# Patient Record
Sex: Female | Born: 1940 | Race: White | Hispanic: No | Smoking: Never smoker
Health system: Southern US, Community
[De-identification: ages and names within clinical notes are randomized; demographics above are authoritative.]

## PROBLEM LIST (undated history)

## (undated) DIAGNOSIS — I2510 Atherosclerotic heart disease of native coronary artery without angina pectoris: Secondary | ICD-10-CM

## (undated) DIAGNOSIS — E119 Type 2 diabetes mellitus without complications: Secondary | ICD-10-CM

## (undated) DIAGNOSIS — G709 Myoneural disorder, unspecified: Secondary | ICD-10-CM

## (undated) DIAGNOSIS — H269 Unspecified cataract: Secondary | ICD-10-CM

## (undated) DIAGNOSIS — M1990 Unspecified osteoarthritis, unspecified site: Secondary | ICD-10-CM

## (undated) DIAGNOSIS — M810 Age-related osteoporosis without current pathological fracture: Secondary | ICD-10-CM

## (undated) DIAGNOSIS — E785 Hyperlipidemia, unspecified: Secondary | ICD-10-CM

## (undated) DIAGNOSIS — I6529 Occlusion and stenosis of unspecified carotid artery: Secondary | ICD-10-CM

## (undated) DIAGNOSIS — I10 Essential (primary) hypertension: Secondary | ICD-10-CM

## (undated) DIAGNOSIS — I739 Peripheral vascular disease, unspecified: Secondary | ICD-10-CM

## (undated) DIAGNOSIS — K219 Gastro-esophageal reflux disease without esophagitis: Secondary | ICD-10-CM

## (undated) HISTORY — DX: Atherosclerotic heart disease of native coronary artery without angina pectoris: I25.10

## (undated) HISTORY — DX: Gastro-esophageal reflux disease without esophagitis: K21.9

## (undated) HISTORY — DX: Essential (primary) hypertension: I10

## (undated) HISTORY — DX: Type 2 diabetes mellitus without complications: E11.9

## (undated) HISTORY — DX: Unspecified cataract: H26.9

## (undated) HISTORY — DX: Myoneural disorder, unspecified: G70.9

## (undated) HISTORY — PX: EYE SURGERY: SHX253

## (undated) HISTORY — PX: FRACTURE SURGERY: SHX138

## (undated) HISTORY — PX: CARDIAC CATHETERIZATION: SHX172

## (undated) HISTORY — DX: Age-related osteoporosis without current pathological fracture: M81.0

## (undated) HISTORY — DX: Peripheral vascular disease, unspecified: I73.9

## (undated) HISTORY — DX: Hyperlipidemia, unspecified: E78.5

## (undated) HISTORY — PX: BREAST SURGERY: SHX581

## (undated) HISTORY — DX: Occlusion and stenosis of unspecified carotid artery: I65.29

## (undated) HISTORY — DX: Unspecified osteoarthritis, unspecified site: M19.90

## (undated) HISTORY — PX: CORONARY ARTERY BYPASS GRAFT: SHX141

---

## 1994-04-19 HISTORY — PX: CORONARY ARTERY BYPASS GRAFT: SHX141

## 2010-04-23 ENCOUNTER — Ambulatory Visit
Admission: RE | Admit: 2010-04-23 | Disposition: A | Discharge status: Home / Self Care | Payer: Self-pay | Source: Ambulatory Visit

## 2011-01-12 ENCOUNTER — Ambulatory Visit
Admission: RE | Admit: 2011-01-12 | Disposition: A | Discharge status: Home / Self Care | Payer: Self-pay | Source: Ambulatory Visit

## 2011-05-04 ENCOUNTER — Ambulatory Visit
Admission: RE | Admit: 2011-05-04 | Disposition: A | Discharge status: Home / Self Care | Payer: Self-pay | Source: Ambulatory Visit | Admitting: Retina Specialist

## 2011-12-01 ENCOUNTER — Ambulatory Visit
Admission: RE | Admit: 2011-12-01 | Disposition: A | Discharge status: Home / Self Care | Payer: Self-pay | Source: Ambulatory Visit | Admitting: Cardiovascular Disease

## 2012-04-20 LAB — CBC
Hematocrit: 41.1 % (ref 36.0–48.0)
Hemoglobin: 13.7 gm/dL (ref 12.0–16.0)
MCH: 33 pg (ref 28–35)
MCHC: 33 gm/dL (ref 32–36)
MCV: 99 fL (ref 80–100)
MPV: 7.3 fL (ref 6.0–10.0)
PLT CT: 191 10*3/uL (ref 130–440)
RBC: 4.14 10*6/uL (ref 3.80–5.00)
RDW: 12.1 % (ref 11.0–14.0)
WBC: 4.5 10*3/uL (ref 4.0–11.0)

## 2012-04-20 LAB — BASIC METABOLIC PANEL
Anion Gap: 13.4 Gap (ref 7.0–16.0)
BUN / Creatinine Ratio: 22 Ratio (ref 10.0–30.0)
BUN: 20 mg/dL (ref 7–22)
CO2: 25.1 mMol/L (ref 20.0–30.0)
Calcium: 9.1 mg/dL (ref 8.5–10.5)
Chloride: 102 mMol/L (ref 98–110)
Creatinine: 0.91 mg/dL (ref 0.60–1.20)
EGFR: >60 mL/min/{1.73_m2}
Glucose: 257 mg/dL — ABNORMAL HIGH (ref 70–99)
Osmolality Calc: 283 mOsm/kg (ref 275–300)
Potassium: 4.5 mMol/L (ref 3.5–5.3)
Sodium: 136 mMol/L (ref 136–147)

## 2012-04-24 LAB — VH DEXTROSE STICK GLUCOSE
Glucose POCT: 217 mg/dL — ABNORMAL HIGH (ref 70–99)
Glucose POCT: 240 mg/dL — ABNORMAL HIGH (ref 70–99)

## 2012-05-22 LAB — LIPID PANEL
Cholesterol: 222 mg/dL — ABNORMAL HIGH (ref 75–199)
Coronary Heart Disease Risk: 2.18
HDL: 102 mg/dL — ABNORMAL HIGH (ref 45–65)
LDL Calculated: 109 mg/dL
Triglycerides: 54 mg/dL (ref 10–150)
VLDL: 11 (ref 0–40)

## 2012-05-22 LAB — I-STAT CHEM 8 CARTRIDGE
Anion Gap I-Stat: 16 (ref 7–16)
BUN I-Stat: 17 mg/dL (ref 6–20)
Calcium Ionized I-Stat: 4.9 mg/dL (ref 4.35–5.10)
Chloride I-Stat: 103 mMol/L (ref 98–112)
Creatinine I-Stat: 0.9 mg/dL (ref 0.60–1.10)
EGFR: >60 mL/min/{1.73_m2}
Glucose I-Stat: 200 mg/dL — ABNORMAL HIGH (ref 70–99)
Hematocrit I-Stat: 42 % (ref 36.0–48.0)
Hemoglobin I-Stat: 14.3 gm/dL (ref 12.0–16.0)
Potassium I-Stat: 4.1 mMol/L (ref 3.5–5.3)
Sodium I-Stat: 138 mMol/L (ref 135–145)
TCO2 I-Stat: 24 mMol/L (ref 24–29)

## 2012-05-22 LAB — CBC AND DIFFERENTIAL
Basophils %: 0.7 % (ref 0.0–3.0)
Basophils Absolute: 0 10*3/uL (ref 0.0–0.3)
Eosinophils %: 2.6 % (ref 0.0–7.0)
Eosinophils Absolute: 0.1 10*3/uL (ref 0.0–0.8)
Hematocrit: 42.1 % (ref 36.0–48.0)
Hemoglobin: 14.5 gm/dL (ref 12.0–16.0)
Lymphocytes Absolute: 0.7 10*3/uL (ref 0.6–5.1)
Lymphocytes: 15.7 % (ref 15.0–46.0)
MCH: 34 pg (ref 28–35)
MCHC: 35 gm/dL (ref 32–36)
MCV: 99 fL (ref 80–100)
MPV: 7 fL (ref 6.0–10.0)
Monocytes Absolute: 0.4 10*3/uL (ref 0.1–1.7)
Monocytes: 8.6 % (ref 3.0–15.0)
Neutrophils %: 72.4 % (ref 42.0–78.0)
Neutrophils Absolute: 3 10*3/uL (ref 1.7–8.6)
PLT CT: 186 10*3/uL (ref 130–440)
RBC: 4.25 10*6/uL (ref 3.80–5.00)
RDW: 12.2 % (ref 11.0–14.0)
WBC: 4.2 10*3/uL (ref 4.0–11.0)

## 2012-05-22 LAB — VH DEXTROSE STICK GLUCOSE: Glucose POCT: 146 mg/dL — ABNORMAL HIGH (ref 70–99)

## 2012-08-29 LAB — ECG 12-LEAD
P Wave Axis: 77 deg
P Wave Duration: 118 ms
P-R Interval: 170 ms
Patient Age: 70 years
Q-T Dispersion: 48 ms
Q-T Interval(Corrected): 460 ms
Q-T Interval: 396 ms
QRS Axis: -16 deg
QRS Duration: 82 ms
T Axis: 82 deg
Ventricular Rate: 81 /min

## 2012-09-09 NOTE — Op Note (Signed)
[facility]                                [facility]                             at [facility]         NAME: Lydia Medina, Lydia Medina [facility]                                   ROOM#: S/--3C         DOB:  1940-10-12                                            AGE/SEX: 70/F       ZO#:[id number]                                                     CINE#:       [id number]       _________________________________________________________________________         TEST DATE: 12-01-2011                    REF PHYS: Gretta Arab ABDUL-RAHMAN                                 LEFT HEART CATHETERIZATION         DATE OF PROCEDURE:       12-01-2011         REFERRING PHYSICIAN:       Jarrett Soho, MD         CINE NUMBER:           TYPE OF REPORT:       LEFT HEART CATHETERIZATION         PHYSICIAN PERFORMING PROCEDURE:       Matilde Bash, MD         ARTERIAL ACCESS:       Right femoral artery with modified Seldinger technique         CONTRAST:       Omnipaque         MEDICATIONS:       4000 units of Heparin in 1000 ml of NS       10000 units of Heparin in 1000 ml of NS       Versed 2mg  IV       Fentanyl 25 mg IV       Angiomax IV bolus 21ml       Angiomax IV drip 48.3 ml/hr       Lidocaine 1%         Fluoroscopy time for the procedure was 12.2 minutes.         EQUIPMENT:       [employer] Med 5 Fr. Angio-Kit Angled       Medtronic Angiographic.035 145cm J wire       Terumo 5 Fr. Short Sheath  Cook 18g needle       ACIST: Syringe, Manifold, [employer]. Jude.035 260cm Exchange J [employer].035 260cm Glidewire       [employer] 5 Fr. IM catheter       Map 112 Accessory Kit       Co-Pilot       Terumo 6 Fr. Clinical biochemist 6 Fr. FL 4.5 RunWay       Volcano Primewire Prestige       Bbraun Ultrasite IV Set         PROCEDURE:       The right groin was prepped and draped in the usual sterile fashion. A       small  amount of 1% Lidocaine was infiltrated. A JL 4 5 Fr. diagnostic       catheter was used to cannulate the native left and a JR 4 5 Fr.       diagnostic catheter was used to cannulate the right coronary artery and       the vein grafts. An IMA catheter was used to cannulate the left internal       mammary artery.         ANGIOGRAPHIC FINDINGS:       The left main is patent, though the ostial LAD is occluded.         The native circumflex is patent with a proximal segment of roughly       40-50% narrowing and subsequently it is quite small and diffusely 20-30%       diseased. It is nondominant. It gives off three very diminutive OM       branches.         The native right coronary artery is occluded at its mid point.       Proximally there is a 50% narrowing. It gives off two small RV branches       before the occlusion.         The vein graft to the obtuse marginal is widely patent with no disease       noted at either the proximal or distal anastomoses, though the OM itself       is smooth but extremely small in caliber, certainly no more than 2mm.         Vein graft to what appears to be a diagonal branch is also widely patent       with no disease at the proximal and distal anastomoses. Likewise, the       diagonal is an extremely small vessel, less than 2mm in diameter.         Vein graft to the PDA is patent with no disease of the proximal and       distal anastomoses.         The PDA is a somewhat larger vessel although also quite small. There is       no focal disease in this vessel either.         The IMA is widely patent and is grafted to an extremely small and       diffusely diseased LAD.         The only potentially attractive target for an intervention would be the       native circumflex artery which has some moderate proximal disease and  does not backfill via the OM.         FFR MEASUREMENT: I performed an FFR measurement of this which was 0.86       at maximal hyperemia and, therefore, this was  not hemodynamically       significant.         LEFT VENTRICULOGRAPHY: At the conclusion of the case, an LV gram was       performed which showed grossly normal LV systolic function with no wall       motion abnormalities and no mitral regurgitation.         HEMODYNAMICS:       AO   138/66 (98)        AOP   129/72 (100)       LV    160/10 (12)        LVP    148/51, 62         IMPRESSION:       1.  Patent vein graft to the OM, diagonal, and PDA. Patent LIMA to             the LAD.       2.  Native RCA occluded at the mid point.       3.  Native LAD occluded ostially.       4.  Native circumflex is patent but the (grafted) OM is occluded           ostially.       5.  Non-hemodynamically significant disease in the proximal           circumflex.       6.  All grafted vessels are extremely diminutive and appear           probably diffusely diseased.         COMMENT: There are no attractive targets here for revascularization. I       would recommend aggressive medical management.                                  **PRELIMINARY REPORT UNLESS SIGNED**                          SEE DOCUMENT IMAGING SYSTEM FOR FINAL REPORT                                                    ________________________________                                                Matilde Bash, MD               ID:   [id number]       DocType:   01TRCH       \:   BB       /:   [id number]       DD:  12/01/2011 16:28:03       DT:  12/02/2011 09:39:12       JOB: [id number]       CC:  MUSTAPHA ABDUL-RAHMAN (208)         >  Authenticated and Edited by Tahesha Skeet Melnick MD., On 12/02/11 2:28:08 PM

## 2012-09-09 NOTE — Discharge Summary (Signed)
[facility]                               DISCHARGE SUMMARY              NAMEMARZELL, ALLEMAND                  ADMITTED:   01/12/2011       MR#:  [id number]                            DISCHARGED: 01/12/2011       DOB:  05-Dec-1940                           ACCT#:      [id number]       _________________________________________________________________       DISCHARGE DIAGNOSES:       1.  Vitreomacular traction syndrome, right eye.       2.  Proliferative diabetic retinopathy.              HISTORY OF PRESENT ILLNESS:       The patient is 72 years of age.  She has had diabetes for       approximately 59 years.  She has a 3-year history of       hypertension.  She developed vitreomacular traction syndrome in       the right eye.  She had noticed visual changes for approximately       a year.              HOSPITAL COURSE:       The patient was admitted to the hospital and taken to the       operating room where she had monitored anesthesia care.  The       surgery consisted of pars plana vitrectomy with release of       vitreomacular traction and panretinal photocoagulation.  This       was carried out without complications.              The patient is to be discharged home today, to return to see me       tomorrow.  She is to maintain a patch and shield over the right       eye.  She is to remove it in the morning and start her drops.       She has a phone number for my practice in case she has any       difficulty.                                            **PRELIMINARY REPORT UNLESS SIGNED**                                SEE DOCUMENT IMAGING SYSTEM FOR FINAL REPORT  ________________________________                                        Maurene Capes, MD                                                                                                                   ID:   [id number]                                                           DocType:   01TRD       \:   KR                                                                 /:   187       DD:  01/12/2011 10:26:56                                                DT:  01/13/2011 01:17:03                                                JOB: [id number]                                                            CC:  Seefried ()            , (208)            NO REFERRAL,DR (1000)                   >                                                                  Authenticated by Maurene Capes., MD On 01/19/2011 11:47:20 AM

## 2012-09-09 NOTE — Discharge Summary (Signed)
[facility]                               DISCHARGE SUMMARY         NAMECAMDYNN, Lydia Medina                  ADMITTED:   12/01/2011       MR#:  [id number]                            DISCHARGED: 12/02/2011       DOB:  1941-01-02                           ACCT#:      [id number]       _________________________________________________________________       DISCHARGE DIAGNOSES:       1.  Coronary artery disease.       2.  Peripheral vascular disease.       3.  Cerebrovascular disease.       4.  Hypertension.       5.  Hyperlipidemia.         DISCHARGE MEDICATIONS:       1.  Carvedilol 6.25 mg twice daily.       2.  Lisinopril 20 mg daily, this is an increase in 10 mg           daily.       3.  Simvastatin 20 mg 0.5 tablet in the morning and 1           tablet in the evening.       4.  Lantus insulin.       5.  NovoLog insulin.         HOSPITAL COURSE:       Ms. Nease is a 72 year old woman with history of coronary       artery disease status post CABG in 1996, who has been       experiencing worsening chest pain over the last couple of months       and was referred for cardiac catheterization by Dr. Tressia Danas to       define her coronary bypass anatomy.  Separate procedure note was       dictated with the full details.           Briefly, she has patent vein grafts to an obtuse marginal, a diagonal,        a PDA arising from the right coronary, and a patent LIMA to the LAD.  Her        native vessels are all extremely small and probably diffusely diseased.         The native circumflex is relatively decent size and did have a       roughly 50% proximal lesion and it does not back fill via the       bypassed OM.  I therefore felt this would be a conceivable       target for intervention, but was unconvinced by the angiographic       appearance and the FFR measurement was only 0.86.  Therefore       this is not a hemodynamically significant lesion.  The       native right does not  backfill via the bypassed PDA and is quite  diffusely       diseased, and conceivably could be intervened upon, but this vessel is       extremely small and supplies very little myocardial territory via some       small RV branches. Therefore, I deferred intervention on this.  I think        that this scenario warrants further aggressive medical       management.  Unfortunately, she has not tolerated Imdur and       apparently had some difficulties with Coreg as well.  I will       discharge her on a slightly higher dose of lisinopril from 10 mg       to 20 mg given that she did have some elevated pressures today,       160s to 170s systolic at various times.  She will have follow up with Dr.        Tressia Danas.                                  **PRELIMINARY REPORT UNLESS SIGNED**                          SEE DOCUMENT IMAGING SYSTEM FOR FINAL REPORT                                            ________________________________                                        Matilde Bash, MD                 ID:   [id number]       DocType:   01TRD       \:   MR       /:   [id number]       DD:  12/01/2011 15:59:14       DT:  12/02/2011 02:29:30       JOB: [id number]       CC:  MUSTAPHA ABDUL-RAHMAN (208)         >  Authenticated and Edited by Mariluz Crespo Melnick MD., On 12/02/11 2:28:05 PM

## 2012-09-09 NOTE — Op Note (Signed)
[facility]                           OPERATIVE/PROCEDURE REPORT              NAME: Lydia Medina, Lydia Medina                  ADMITTED:   01/12/2011       MR#:  [id number]                            ACCT#:      [id number]       DOB:  Sep 10, 1940       _________________________________________________________________       DATE OF SURGERY:       01/12/2011              DIAGNOSES:       1.  Vitreomacular traction syndrome, right eye.       2.  Proliferative diabetic retinopathy, right eye.              PROCEDURE:       Pars plana vitrectomy with dissection of epiretinal membranes       and panretinal photocoagulation, right eye.              SURGEON:       Kylle Lall B. Montez Morita, MD              ASSISTANT:       Henrietta Hoover, CST              ANESTHESIA:       Monitored anesthesia care with retrobulbar block.              DESCRIPTION OF PROCEDURE:       The patient was taken to the operating room on the vitreoretinal       surgery stretcher.  Local and retrobulbar anesthesia were       delivered using a 50/50 mixture of 0.75% Sensorcaine and 4%       Xylocaine.  Approximately 3 mL of this solution were delivered       to the region of the stylomastoid foramen using a short 25 gauge       needle.  Another 5 mL were delivered to the retrobulbar space       using a long 25 gauge needle.  The patient was prepped in       sterile fashion using half strength Betadine solution to prep       the side of the face, the eyelashes, the eyelids, and to       irrigate the conjunctival fornices.  After prepping and draping,       a rigid lid speculum was placed.  We paused to confirm the       patient, the eye, and the procedure.              Three sclerotomies were made with the same technique.  In each       instance, a point was measured 3 mm posterior to the limbus.       The conjunctiva was displaced anteriorly, and the trocar and       cannula set was directed into the sclera in a direction        tangential to the limbus.  When the tip of the trocar had  embedded into the sclera, the trocar and cannula set was       redirected to a position perpendicular to the sclera, and the       trocar was advanced into the vitreous cavity to the hilt of the       cannula.  The trocar was removed.  One such sclerotomy was made       in the inferotemporal quadrant where the infusion cannula was       attached.  The tip of the infusion cannula was visualized       through the pupil before starting the infusion solution.  Two       additional sclerotomies were made in the 9:30 and 2:30 positions       for the light pipe and vitrector.              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  A central       vitrectomy was carried out.  There was found to be vitreomacular       traction syndrome in the posterior segment.  The membranes were              marked using Triesence and removed using the pick, the end       grasping forceps, and the vitrectomy probe.  After vitreomacular       traction had been removed, panretinal photocoagulation was       carried out using a power of 180 mW and a spot size of       approximately 200 microns.  Just over 1000 spots were applied.       The peripheral retina was examined and there were found to be no       tears or detachments.  The cannulas were removed.  The patient       was given subconjunctival injection of Ancef.  Tobramycin       ointment was placed in the operated eye.  The eye was closed and       a patch and shield were placed over the eye.  The patient was       taken back to the AM Admit Unit in satisfactory condition.                                            **PRELIMINARY REPORT UNLESS SIGNED**                                SEE DOCUMENT IMAGING SYSTEM FOR FINAL REPORT                                                        ________________________________                                        Maurene Capes, MD  ID:   [id number]                                                          DocType:   01TRO       \:   RY                                                                 /:   187       DD:  01/12/2011 10:24:26                                                DT:  01/13/2011 00:04:40                                                JOB: [id number]                                                            CC:  , (208)            NO REFERRAL,DR (1000)                   >                                                                  Authenticated by Maurene Capes., MD On 01/19/2011 11:47:16 AM

## 2012-09-09 NOTE — H&P (Signed)
[facility]                             HISTORY AND PHYSICAL         NAME: Lydia Medina, Lydia Medina                  ADMITTED:   01/12/2011       MR#:  [id number]                            ACCT#:      [id number]       DOB:  01-09-1941       _________________________________________________________________       ADMITTING DIAGNOSIS:       Vitreomacular traction syndrome, right eye.         HISTORY OF PRESENT ILLNESS:       The patient is a 72 year old female with a long history of       diabetes mellitus and diabetic retinopathy.  She has had       diabetes for approximately 59 years and a three-year history of       hypertension.  She has noted visual change in the right eye for       approximately the past year.         REVIEW OF SYSTEMS:       There is no history of chest pain, shortness of breath, coughs,       colds, infection, or claustrophobia.         PAST MEDICAL HISTORY:       Includes a 59 year history of diabetes and a three-year history       of hypertension.  She had a myocardial infarction in 1997 which       was followed by attempted stent placement and then four-vessel       bypass.  She has no history of stroke, hepatitis, HIV, cancer,       tuberculosis, or renal insufficiency.         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         FAMILY HISTORY:       Negative for retinal detachment, glaucoma, reactions to       anesthesia.         SOCIAL HISTORY:       She does not smoke, drink, or abuse drugs.         ALLERGIES:       No known drug allergies.         CURRENT MEDICATIONS:       Include:         1.  Aspirin 325 mg per day.       2.  NovoLog insulin 3 times a day on a sliding scale.       3.  Lantus insulin 2 times a day, 3 units in the morning           and 8 units in the evening.       4.  Nitro-pump 400 mg  sublingual spray.       5.  Carvedilol 6.25 t.i.d.       6.  Lisinopril 10 mg per day.       7.  Simvastatin 40 mg a half tablet in the evening.       8.  Cascara [place] 390 mg per day.       9.  Coenzyme Q10 50 mg b.i.d.       10.  Calcium and magnesium 400 mg b.i.d.       11.  Multivitamin per day.       12.  Vitamin E 400 mg three times a week.         13.  Flaxseed oil 400 mg per day.       14.  B complex per day.       15.  Isosorbide mononitrate 3 times per week.         PHYSICAL EXAMINATION:       GENERAL:  The patient is a well-developed, well-nourished       72 year old female who appears her stated age.       HEENT EXAMINATION:  Visual acuity with correction is 20/60 in       the right eye and 20/30 in the left.  Tensions and slit-lamp       examination are unremarkable except for pseudophakia.  On       dilated ophthalmoscopy of the right eye, she has       neovascularization of the disc, with diabetic and hypertensive       type microvascular changes.  She has significant vitreomacular       traction, as well as an epiretinal membrane.  There is no       traction retinal detachment.       NECK:  Supple, without bruits.       HEART:  Normal S1, S2 sounds.       CHEST:  Clear.       ABDOMEN:  Positive bowel sounds.       GENITORECTAL EXAMINATION:  Deferred.       EXTREMITIES:  Without cyanosis, clubbing, or edema.         PLAN:       The patient is to be admitted to the hospital on 01/12/2011, to       be taken to the operating room for monitored anesthesia care.       Planned surgery includes 25-gauge vitrectomy with release of       vitreomacular traction, membrane peeling, and panretinal       photocoagulation.  The indications, alternatives, risks, and       complications have been discussed with the patient, and she       wished to proceed.  All questions have been answered.                                  **PRELIMINARY REPORT UNLESS SIGNED**                          SEE DOCUMENT IMAGING SYSTEM FOR  FINAL REPORT         I hereby certify this patient for hospitalization based upon       medical necessity as noted above.  ________________________________                                        Maurene Capes, MD               ID:   [id number]       DocType:   01TRH       \:   NG       /:   187       DD:  01/08/2011 09:23:54       DT:  01/08/2011 10:42:52       JOB: [id number]       CC:  Cicero Duck MD [phone])            ABDUL-RAHMAN,MUSTAPHA (208)         >  Authenticated and Edited by Maurene Capes., MD On 01/19/11 11:48:15 AM

## 2012-09-09 NOTE — H&P (Signed)
[facility]                             HISTORY AND PHYSICAL              NAME: Lydia Medina, Lydia Medina                  ADMITTED:   12/01/2011       MR#:  [id number]                            ACCT#:      [id number]       DOB:  Sep 15, 1940       _________________________________________________________________       REFERRING PHYSICIAN:       Melanie D. Tressia Danas, M.D.              REASON FOR ADMISSION:       Heart catheterization.              HISTORY OF PRESENT ILLNESS:       Lydia Medina is a 72 year old woman with a past medical history       of coronary artery disease, status post 4-vessel CABG in 1996,       who has been experiencing increasing chest pain over the last       couple of months.  She has been having difficulties taking her       Coreg and Imdur due to lightheadedness, and currently has       required multiple sublingual nitroglycerin a day.  She is       referred for heart catheterization to define her anatomy.              ALLERGIES:       No known drug allergies.              CURRENT MEDICATIONS:       1.  Coreg 6.25 mg twice daily.       2.  Lisinopril 10 mg daily.       3.  Simvastatin 20-mg tablets, half a tab in the morning           and 1 tab in the evening.       4.  Lantus insulin.       5.  NovoLog insulin.              PAST MEDICAL HISTORY:       1.  Multivessel coronary artery disease, status post           4-vessel CABG in 1996.       2.  Known PVD with claudication.       3.  Insulin-requiring diabetes.       4.  Hypertension.       5.  Hyperlipidemia.       6.  Carotid artery disease.              FAMILY HISTORY:       Father died of an MI.  Otherwise noncontributory.              SOCIAL HISTORY:       She is married.  She is a nonsmoker and drinks alcohol.              PHYSICAL EXAMINATION:       VITAL SIGNS:  Blood pressure was  in the 120s/70s.  Heart rate is       in the 70s.       GENERAL:  She is a pleasant woman who is lying in bed, in no        distress.       HEART:  Regular rate and rhythm with normal S1 and S2, and a       faint systolic murmur.       LUNGS:  Clear to auscultation bilaterally.       ABDOMEN:  Soft, nontender, nondistended with normal bowel       sounds.  No palpable masses and no bruit.       EXTREMITIES:  Warm, well perfused.  No cyanosis, clubbing, or              edema.              LABORATORY DATA:       Reviewed and are unremarkable.              ASSESSMENT/PLAN:       Lydia Medina is a 72 year old woman with coronary artery disease,       status post coronary artery bypass graft, now with a couple of       months of increasing chest pain.  We will proceed with cardiac       catheterization today.                                            **PRELIMINARY REPORT UNLESS SIGNED**                                SEE DOCUMENT IMAGING SYSTEM FOR FINAL REPORT                I hereby certify this patient for hospitalization based upon        medical necessity as noted above.                                                      ________________________________                                        Matilde Bash, MD                                                                                                               ID:   [id number]  DocType:   01TRH       \:   LR                                                                 /:   [id number]       DD:  12/01/2011 15:52:52                                                DT:  12/01/2011 21:30:44                                                JOB: [id number]                                                            CC:  MUSTAPHA ABDUL-RAHMAN (208)                   >                                                                  Authenticated by Lucero Ide Melnick MD., On 12/02/2011 01:48:23 PM

## 2012-09-10 NOTE — H&P (Signed)
[facility]                             HISTORY AND PHYSICAL              NAME: Lydia Medina, Lydia Medina                  ADMITTED:   05/04/2011       MR#:  [id number]                            ACCT#:      [id number]       DOB:  [birth year]       _________________________________________________________________       ADMITTING DIAGNOSIS:       Traction retinal detachment, left eye.              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  She has had a prior vitrectomy to the       right eye on March 14, 2011.              REVIEW OF SYSTEMS:       There is no history of chest pain, shortness of breath, coughs,       colds, infection, or claustrophobia.              PAST MEDICAL HISTORY:       She has had an approximately 60 year history of diabetes and a       two year history of hypertension.  She had a history of a MI in       February 1997.  There is no history of stroke, hepatitis, HIV,       cancer, tuberculosis or renal disease.              PAST SURGICAL HISTORY:       She had a five-vessel bypass in 1997.  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              FAMILY HISTORY:       Negative for retinal detachment, glaucoma, or reactions to       anesthesia.              SOCIAL HISTORY:       She does not smoke, drink, or abuse drugs.              ALLERGIES:       No known drug allergies.              CURRENT MEDICATIONS:       Include:              1.  Aspirin 325, 3 times a week.       2.  She does not take Coumadin or Plavix or Pradaxa.       3.  NovoLog on a sliding scale three times a day.       4.  Lantus 3  units in a.m. and 8 units in the p.m.       5.  Nitro spray pump 400 mcg p.r.n.       6.   Carvedilol 6.25 mg b.i.d.       7.  Lisinopril 10 mg every other day.       8.  Simvastatin 40 mg 1/2 a tablet per day.       9.  Cascarnsegnade 390 mg per day.       10.  Coenzyme Q10 50 mg b.i.d.       11.  Calcium and magnesium 1000/400 mg b.i.d.       12.  Vitamin E 400 mg three times a week.       13.  Aspirin as noted.       14.  Multivitamin per day.              15.  Flaxseed oil 400 mg per day.       16.  B complex vitamins per day.       17.  Glucagon emergency kit.              PHYSICAL EXAMINATION:       GENERAL:  Patient is a well-developed, well-nourished,       72 year old white female who appears her stated age.       HEENT:  Visual acuity with correction is 20/70 in the right eye       and 20/50 in the left.  Tensions are normal.  On slit-lamp       examination of the left eye, she has a posterior chamber lens.       There is no neovascularization of the iris.  On dilated       ophthalmoscopy, she has extensive epiretinal fibrosis with areas       of traction and retinal elevation surrounding the center of the       macula.  There is some active neovascularization, but this is       relatively mild.  Initial PRP has been applied to this eye.       NECK:  Supple, without bruits.       HEART:  Normal S1, S2 sounds.       CHEST:  Clear.       ABDOMEN:  Positive bowel sounds.       GENITORECTAL EXAMINATION:  Deferred.       EXTREMITIES:  There is flexor contraction in her hands.  There       is no cyanosis, clubbing, or edema.              PLAN:       The patient will be admitted to the hospital on 05/04/2011 to be       taken to the operating room for monitored anesthesia care.       Surgery includes 25-gauge vitrectomy with removal of epiretinal       membranes and panretinal photocoagulation.  The indications,       alternatives, risks, and complications were discussed with the       patient, and she wished to proceed.  All questions have been       answered.                                             **  PRELIMINARY REPORT UNLESS SIGNED**                                SEE DOCUMENT IMAGING SYSTEM FOR FINAL REPORT                I hereby certify this patient for hospitalization based upon        medical necessity as noted above.                                                      ________________________________                                        Maurene Capes, MD                                                                                                                   ID:   [id number]                                                          DocType:   01TRH       \:   SW                                                                 /:   187       DD:  04/26/2011 09:27:00                                                DT:  04/26/2011 12:29:58                                                JOB: [id number]  CC:  MUSTAPHA ABDUL-RAHMAN ([id number])            RICHARD A SEEFRIED [phone])                   >                                                                  Authenticated by Maurene Capes., MD On 05/04/2011 05:07:37 PM

## 2012-09-10 NOTE — Op Note (Signed)
[facility]                           OPERATIVE/PROCEDURE REPORT              NAME: Lydia Medina, Lydia Medina                  ADMITTED:   05/04/2011       MR#:  [id number]                            ACCT#:      [id number]       DOB:  [birth year]       _________________________________________________________________       DATE OF SURGERY:       05/04/2011              DIAGNOSIS:       Tractional retinal detachment, left eye.              SURGERY:       Pars plana vitrectomy, peeling of epiretinal membranes and       panretinal photocoagulation, left eye.              SURGEON:       Sourish Allender B. Montez Morita, MD              ASSISTANT:       Henrietta Hoover, CST              ANESTHESIA:       Monitored anesthesia care with retrobulbar block.              PROCEDURE IN DETAIL:       The patient was taken to the operating room on the vitreoretinal       surgery stretcher.  Local and retrobulbar anesthesia were       delivered using a 50/50 mixture of 0.75% Sensorcaine and 4%       Xylocaine.  Approximately 3 mL of this solution were delivered       to the region of the stylomastoid foramen using a short 25 gauge       needle.  Another 5 mL were delivered to the retrobulbar space       using a long 25 gauge needle.  The patient was prepped in       sterile fashion using half strength Betadine solution to prep       the side of the face, the eyelashes, the eyelids, and to       irrigate the conjunctival fornices.  After prepping and draping,       a rigid lid speculum was placed.  We paused to confirm the       patient, the eye, and the procedure.              Three sclerotomies were made with the same technique.  In each       instance, a point was measured 3 mm posterior to the limbus.       The conjunctiva was displaced anteriorly, and the trocar and       cannula set was directed into the sclera in a direction       tangential to the limbus.  When the tip of the trocar had       embedded into the  sclera, the trocar and cannula set was  redirected to a position perpendicular to the sclera, and the       trocar was advanced into the vitreous cavity to the hilt of the       cannula.  The trocar was removed.  One such sclerotomy was made       in the inferotemporal quadrant where the infusion cannula was       attached.  The tip of the infusion cannula was visualized       through the pupil before starting the infusion solution.  Two       additional sclerotomies were made in the 9:30 and 2:30 positions       for the light pipe and vitrector.              The patient was found to have areas of tractional elevation       surrounding the macula.  The center of the macula was not       detached.  A dissection was carried out to remove the fibrous       material and all the vitreomacular traction.  The fibrous              material was relatively avascular.  Once all the traction had       been removed, panretinal photocoagulation was carried out using       a power of 200 mW and with the flexible endolaser probe.  Just       over 100 spots were applied.  The peripheral retina was examined       and found to be no iatrogenic tears or detachments.              The cannulas were removed from the eye.  The eye was noted to be       watertight.  The patient was given subconjunctival injections of       Ancef and topical tobramycin was applied.  The eye was closed       and patch and shield were placed over the eye.  The patient was       then taken back to the a.m. admit unit in satisfactory       condition.                                            **PRELIMINARY REPORT UNLESS SIGNED**                                SEE DOCUMENT IMAGING SYSTEM FOR FINAL REPORT                                                        ________________________________                                        Maurene Capes, MD  ID:   [id number]                                                          DocType:   01TRO       \:   AY                                                                 /:   187       DD:  05/04/2011 10:39:59                                                DT:  05/05/2011 01:25:26                                                JOB: [id number]                                                            CC:  MUSTAPHA ABDUL-RAHMAN ([id number])            RICHARD A SEEFRIED ([id number])                   >                                                                  Authenticated by Maurene Capes., MD On 05/18/2011 02:49:33 PM

## 2012-09-10 NOTE — Discharge Summary (Signed)
[facility]                               DISCHARGE SUMMARY              NAMEVERSA, CRATON                  ADMITTED:   05/04/2011       MR#:  [id number]                            DISCHARGED: 05/04/2011       DOB:  [birth year]                           ACCT#:      [id number]       _________________________________________________________________       DISCHARGE DIAGNOSIS:       Traction retinal detachment, left eye.              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  She had prior vitrectomy to the right       eye on March 14, 2011.              HOSPITAL COURSE:       The patient was admitted to the hospital and taken to the       operating room for monitored anesthesia care.  The surgery       consisted of 25-gauge vitrectomy with removal of vitreomacular       traction and panretinal photocoagulation.  This was carried out       without complications.              The patient is to be discharged home today, to return to see me       tomorrow.  She is to maintain a patch and shield over the left       eye.  She is to remove the patch in the morning and start her       drops.  She has my phone number in case she has any difficulty.                                            **PRELIMINARY REPORT UNLESS SIGNED**                                SEE DOCUMENT IMAGING SYSTEM FOR FINAL REPORT                                                        ________________________________  Maurene Capes, MD                                                                                                                   ID:   [id number]                                                          DocType:   01TRD       \:   MR                                                                  /:   187       DD:  05/04/2011 10:42:10                                                DT:  05/05/2011 01:05:34                                                JOB: [id number]                                                            CC:  MUSTAPHA ABDUL-RAHMAN (208)            RICHARD A SEEFRIED [phone])                   >                                                                  Authenticated by Maurene Capes., MD On 05/18/2011 02:49:27 PM

## 2012-10-19 ENCOUNTER — Encounter (RURAL_HEALTH_CENTER): Payer: Self-pay

## 2013-05-25 LAB — ECG 12-LEAD
P Wave Axis: 65 deg
P Wave Duration: 124 ms
P-R Interval: 170 ms
Patient Age: 69 years
Q-T Dispersion: 28 ms
Q-T Interval(Corrected): 423 ms
Q-T Interval: 378 ms
QRS Axis: -13 deg
QRS Duration: 86 ms
T Axis: 66 deg
Ventricular Rate: 86 /min

## 2014-04-23 DIAGNOSIS — I6521 Occlusion and stenosis of right carotid artery: Secondary | ICD-10-CM | POA: Diagnosis not present

## 2014-04-23 DIAGNOSIS — I10 Essential (primary) hypertension: Secondary | ICD-10-CM | POA: Diagnosis not present

## 2014-04-23 DIAGNOSIS — E1065 Type 1 diabetes mellitus with hyperglycemia: Secondary | ICD-10-CM | POA: Diagnosis not present

## 2014-04-23 DIAGNOSIS — I25810 Atherosclerosis of coronary artery bypass graft(s) without angina pectoris: Secondary | ICD-10-CM | POA: Diagnosis not present

## 2014-04-23 DIAGNOSIS — E7881 Lipoid dermatoarthritis: Secondary | ICD-10-CM | POA: Diagnosis not present

## 2014-04-23 DIAGNOSIS — Z01810 Encounter for preprocedural cardiovascular examination: Secondary | ICD-10-CM | POA: Diagnosis not present

## 2014-05-02 DIAGNOSIS — M205X1 Other deformities of toe(s) (acquired), right foot: Secondary | ICD-10-CM | POA: Diagnosis not present

## 2014-05-02 DIAGNOSIS — M2041 Other hammer toe(s) (acquired), right foot: Secondary | ICD-10-CM | POA: Diagnosis not present

## 2014-05-02 DIAGNOSIS — M25571 Pain in right ankle and joints of right foot: Secondary | ICD-10-CM | POA: Diagnosis not present

## 2014-05-03 DIAGNOSIS — Z794 Long term (current) use of insulin: Secondary | ICD-10-CM | POA: Diagnosis not present

## 2014-05-03 DIAGNOSIS — E119 Type 2 diabetes mellitus without complications: Secondary | ICD-10-CM | POA: Diagnosis not present

## 2014-05-03 DIAGNOSIS — I10 Essential (primary) hypertension: Secondary | ICD-10-CM | POA: Diagnosis not present

## 2014-05-03 DIAGNOSIS — I25810 Atherosclerosis of coronary artery bypass graft(s) without angina pectoris: Secondary | ICD-10-CM | POA: Diagnosis not present

## 2014-05-03 DIAGNOSIS — M2041 Other hammer toe(s) (acquired), right foot: Secondary | ICD-10-CM | POA: Diagnosis not present

## 2014-05-03 DIAGNOSIS — M25571 Pain in right ankle and joints of right foot: Secondary | ICD-10-CM | POA: Diagnosis not present

## 2014-05-03 DIAGNOSIS — I739 Peripheral vascular disease, unspecified: Secondary | ICD-10-CM | POA: Diagnosis not present

## 2014-05-03 DIAGNOSIS — M24574 Contracture, right foot: Secondary | ICD-10-CM | POA: Diagnosis not present

## 2014-05-03 DIAGNOSIS — Z7982 Long term (current) use of aspirin: Secondary | ICD-10-CM | POA: Diagnosis not present

## 2014-05-03 DIAGNOSIS — E785 Hyperlipidemia, unspecified: Secondary | ICD-10-CM | POA: Diagnosis not present

## 2014-05-03 DIAGNOSIS — L97519 Non-pressure chronic ulcer of other part of right foot with unspecified severity: Secondary | ICD-10-CM | POA: Diagnosis not present

## 2014-05-03 DIAGNOSIS — M205X1 Other deformities of toe(s) (acquired), right foot: Secondary | ICD-10-CM | POA: Diagnosis not present

## 2014-05-03 DIAGNOSIS — Z951 Presence of aortocoronary bypass graft: Secondary | ICD-10-CM | POA: Diagnosis not present

## 2014-05-09 DIAGNOSIS — M2041 Other hammer toe(s) (acquired), right foot: Secondary | ICD-10-CM | POA: Diagnosis not present

## 2014-05-09 DIAGNOSIS — M205X1 Other deformities of toe(s) (acquired), right foot: Secondary | ICD-10-CM | POA: Diagnosis not present

## 2014-05-09 DIAGNOSIS — M25571 Pain in right ankle and joints of right foot: Secondary | ICD-10-CM | POA: Diagnosis not present

## 2014-05-31 DIAGNOSIS — M79672 Pain in left foot: Secondary | ICD-10-CM | POA: Diagnosis not present

## 2014-05-31 DIAGNOSIS — L02612 Cutaneous abscess of left foot: Secondary | ICD-10-CM | POA: Diagnosis not present

## 2014-06-13 DIAGNOSIS — L02612 Cutaneous abscess of left foot: Secondary | ICD-10-CM | POA: Diagnosis not present

## 2014-06-13 DIAGNOSIS — M79672 Pain in left foot: Secondary | ICD-10-CM | POA: Diagnosis not present

## 2014-06-17 DIAGNOSIS — H35373 Puckering of macula, bilateral: Secondary | ICD-10-CM | POA: Diagnosis not present

## 2014-06-17 DIAGNOSIS — E10351 Type 1 diabetes mellitus with proliferative diabetic retinopathy with macular edema: Secondary | ICD-10-CM | POA: Diagnosis not present

## 2014-06-17 DIAGNOSIS — H3342 Traction detachment of retina, left eye: Secondary | ICD-10-CM | POA: Diagnosis not present

## 2014-07-02 DIAGNOSIS — L02612 Cutaneous abscess of left foot: Secondary | ICD-10-CM | POA: Diagnosis not present

## 2014-07-02 DIAGNOSIS — M79672 Pain in left foot: Secondary | ICD-10-CM | POA: Diagnosis not present

## 2014-07-06 DIAGNOSIS — R05 Cough: Secondary | ICD-10-CM | POA: Diagnosis not present

## 2014-07-12 DIAGNOSIS — R05 Cough: Secondary | ICD-10-CM | POA: Diagnosis not present

## 2014-10-14 ENCOUNTER — Ambulatory Visit: Payer: Medicare Other

## 2014-10-14 DIAGNOSIS — H3342 Traction detachment of retina, left eye: Secondary | ICD-10-CM | POA: Diagnosis not present

## 2014-10-14 DIAGNOSIS — H35373 Puckering of macula, bilateral: Secondary | ICD-10-CM | POA: Diagnosis not present

## 2014-10-14 DIAGNOSIS — H35033 Hypertensive retinopathy, bilateral: Secondary | ICD-10-CM | POA: Diagnosis not present

## 2014-10-14 DIAGNOSIS — H4311 Vitreous hemorrhage, right eye: Secondary | ICD-10-CM | POA: Diagnosis not present

## 2014-10-14 DIAGNOSIS — E10351 Type 1 diabetes mellitus with proliferative diabetic retinopathy with macular edema: Secondary | ICD-10-CM | POA: Diagnosis not present

## 2014-10-18 ENCOUNTER — Ambulatory Visit
Admission: RE | Admit: 2014-10-18 | Discharge: 2014-10-18 | Disposition: A | Discharge status: Home / Self Care | Payer: Medicare Other | Source: Ambulatory Visit | Attending: Retina Specialist | Admitting: Retina Specialist

## 2014-10-18 ENCOUNTER — Ambulatory Visit: Payer: Medicare Other

## 2014-10-18 DIAGNOSIS — H4311 Vitreous hemorrhage, right eye: Secondary | ICD-10-CM | POA: Insufficient documentation

## 2014-10-18 DIAGNOSIS — H35373 Puckering of macula, bilateral: Secondary | ICD-10-CM | POA: Diagnosis not present

## 2014-10-18 DIAGNOSIS — H3342 Traction detachment of retina, left eye: Secondary | ICD-10-CM | POA: Diagnosis not present

## 2014-10-18 DIAGNOSIS — E10351 Type 1 diabetes mellitus with proliferative diabetic retinopathy with macular edema: Secondary | ICD-10-CM | POA: Diagnosis not present

## 2014-10-18 LAB — BASIC METABOLIC PANEL
Anion Gap: 11.8 mMol/L (ref 7.0–18.0)
BUN / Creatinine Ratio: 23.2 Ratio (ref 10.0–30.0)
BUN: 19 mg/dL (ref 7–22)
CO2: 29.3 mMol/L (ref 20.0–30.0)
Calcium: 9.4 mg/dL (ref 8.5–10.5)
Chloride: 102 mMol/L (ref 98–110)
Creatinine: 0.82 mg/dL (ref 0.60–1.20)
EGFR: >60 mL/min/{1.73_m2}
Glucose: 94 mg/dL (ref 70–99)
Osmolality Calc: 280 mOsm/kg (ref 275–300)
Potassium: 4.1 mMol/L (ref 3.5–5.3)
Sodium: 139 mMol/L (ref 136–147)

## 2014-10-18 LAB — CBC AND DIFFERENTIAL
Basophils %: 0.3 % (ref 0.0–3.0)
Basophils Absolute: 0 10*3/uL (ref 0.0–0.3)
Eosinophils %: 2.1 % (ref 0.0–7.0)
Eosinophils Absolute: 0.1 10*3/uL (ref 0.0–0.8)
Hematocrit: 40.4 % (ref 36.0–48.0)
Hemoglobin: 13.2 gm/dL (ref 12.0–16.0)
Lymphocytes Absolute: 1.2 10*3/uL (ref 0.6–5.1)
Lymphocytes: 26.2 % (ref 15.0–46.0)
MCH: 33 pg (ref 28–35)
MCHC: 33 gm/dL (ref 32–36)
MCV: 102 fL — ABNORMAL HIGH (ref 80–100)
MPV: 7.3 fL (ref 6.0–10.0)
Monocytes Absolute: 0.4 10*3/uL (ref 0.1–1.7)
Monocytes: 8.1 % (ref 3.0–15.0)
Neutrophils %: 63.3 % (ref 42.0–78.0)
Neutrophils Absolute: 2.9 10*3/uL (ref 1.7–8.6)
PLT CT: 185 10*3/uL (ref 130–440)
RBC: 3.97 10*6/uL (ref 3.80–5.00)
RDW: 11.9 % (ref 11.0–14.0)
WBC: 4.6 10*3/uL (ref 4.0–11.0)

## 2014-10-20 NOTE — H&P (Addendum)
[facility]  History and Physical      Date Time: 10/20/2014 12:54 PM  Patient Name: Lydia Medina, Lydia Medina  DoB: [birth year]  Age: 74 y.o.   PCP: Cline Cools, M.D.  RefUlanda Edison, M.D.    .History of Present Illness:     Rossi is a 74 y.o. female who was examined in my office and found to have Vitreous hemorrhage, and is admitted for surgery on the right eye.    Past medical history:     The following portions of the patient's history were reviewed and updated as appropriate: She  has no past medical history on file.    Surgical History  She has no past surgical history on file.    Family History  Her family history is not on file.    Social History  She has no tobacco, alcohol, and drug history on file.    Medications     Prior to Admission medications    Not on File         Allergies     Shehas No Known Allergies.      Review of Systems:     Respiratory ROS: no cough, shortness of breath, or wheezing  Cardiovascular ROS: no chest pain or dyspnea on exertion  Neurological ROS: no TIA or stroke symptoms  Claustrophobia none    Objective:   Reflects examination performed on 7.1.16    GENERAL:  She is well developed, and well nourished female in no acute distress.  She is alert and oriented times three.     HEENT: Visual acuity, corrected:         R 20/CF    L  20/30      Anterior segments                    R clear, PCIOL          IOP      R Normal      Fundus OD:No view. + VH. ? Inferior RD vs blood layered on an inferior PVD  NECK Supple, without bruits  LUNGS:  Clear to auscultation.   CARDIAC:  Regular rate and rhythm without murmurs rubs or gallops.    ABDOMEN:  Normal bowel sounds.  NEURO:  Neurovascular status grossly intact.  EXTREMITIES: Without cyanosis, clubbing or edema. Contractions in hands.      Assessment      Vitreous Hemorrhage, OD    Plan:     I discussed the clinical exam and studies with the patient.  Based on her presentation, examination and diagnostic studies she has opted to proceed with  surgery.  We discussed the possible complications, risks and alternatives. Among the risks are pain, infection, bleeding, loss of vision, and loss of the eye. We've discussed risk associated with how the eye responds to surgery and formation of scar tissue after the surgery. We've discussed anesthetic risk. The patient understands the risks and consents to surgery. The patient is to have nothing to eat or drink after midnight the night before surgery.  The patient may take medications with a sip of water in the morning, but nothing else by mouth.   The patient is to be admitted to the hospital for monitored anesthesia care with retrobulbar block for surgery on the right eye.   All questions have been answered.      Dante Gang, MD  10/20/2014       I have reviewed the H&P, examined the patient and there  are no changes.

## 2014-10-22 ENCOUNTER — Encounter
Admission: RE | Disposition: A | Discharge status: Home / Self Care | Payer: Self-pay | Source: Ambulatory Visit | Attending: Retina Specialist

## 2014-10-22 ENCOUNTER — Observation Stay (HOSPITAL_BASED_OUTPATIENT_CLINIC_OR_DEPARTMENT_OTHER): Payer: Medicare Other | Admitting: Certified Registered"

## 2014-10-22 ENCOUNTER — Ambulatory Visit
Admission: RE | Admit: 2014-10-22 | Discharge: 2014-10-22 | Disposition: A | Discharge status: Home / Self Care | Payer: Medicare Other | Source: Ambulatory Visit | Attending: Retina Specialist | Admitting: Retina Specialist

## 2014-10-22 ENCOUNTER — Observation Stay: Payer: Medicare Other | Admitting: Retina Specialist

## 2014-10-22 ENCOUNTER — Observation Stay: Payer: Medicare Other | Admitting: Certified Registered"

## 2014-10-22 ENCOUNTER — Encounter: Payer: Self-pay | Admitting: Certified Registered"

## 2014-10-22 DIAGNOSIS — H4311 Vitreous hemorrhage, right eye: Secondary | ICD-10-CM | POA: Insufficient documentation

## 2014-10-22 DIAGNOSIS — R739 Hyperglycemia, unspecified: Secondary | ICD-10-CM | POA: Insufficient documentation

## 2014-10-22 DIAGNOSIS — H3321 Serous retinal detachment, right eye: Secondary | ICD-10-CM | POA: Insufficient documentation

## 2014-10-22 HISTORY — DX: Type 2 diabetes mellitus without complications: E11.9

## 2014-10-22 HISTORY — PX: VITRECTOMY, 25 GAUGE, MECHANICAL: SHX5707

## 2014-10-22 HISTORY — DX: Atherosclerotic heart disease of native coronary artery without angina pectoris: I25.10

## 2014-10-22 LAB — VH DEXTROSE STICK GLUCOSE
Glucose POCT: 76 mg/dL (ref 70–99)
Glucose POCT: 111 mg/dL — ABNORMAL HIGH (ref 70–99)

## 2014-10-22 SURGERY — VITRECTOMY 25 GAUGE, MECHANICAL, PARS PLANA
Anesthesia: Anesthesia MAC / Sedation | Site: Eye | Laterality: Right | Wound class: Clean

## 2014-10-22 MED ORDER — KETOROLAC TROMETHAMINE 15 MG/ML IJ SOLN
15.0000 mg | Freq: Once | INTRAMUSCULAR | Status: AC
Start: 2014-10-22 — End: 2014-10-22
  Administered 2014-10-22: 15 mg via INTRAVENOUS
  Filled 2014-10-22: qty 1

## 2014-10-22 MED ORDER — DEXTROSE 50 % IV SOLN
INTRAVENOUS | Status: DC
Start: 2014-10-22 — End: 2014-10-22
  Filled 2014-10-22: qty 50

## 2014-10-22 MED ORDER — CEFAZOLIN SODIUM 1 G IJ SOLR
INTRAMUSCULAR | Status: AC
Start: 2014-10-22 — End: ?
  Filled 2014-10-22: qty 1000

## 2014-10-22 MED ORDER — BSS IO SOLN
INTRAOCULAR | Status: AC
Start: 2014-10-22 — End: ?
  Filled 2014-10-22: qty 15

## 2014-10-22 MED ORDER — TOBRAMYCIN 0.3 % OP OINT
TOPICAL_OINTMENT | OPHTHALMIC | Status: AC
Start: 2014-10-22 — End: ?
  Filled 2014-10-22: qty 3.5

## 2014-10-22 MED ORDER — PROPOFOL 10 MG/ML IV EMUL (WRAP)
INTRAVENOUS | Status: AC
Start: 2014-10-22 — End: ?
  Filled 2014-10-22: qty 20

## 2014-10-22 MED ORDER — LIDOCAINE HCL (PF) 2 % IJ SOLN
INTRAMUSCULAR | Status: AC
Start: 2014-10-22 — End: ?
  Filled 2014-10-22: qty 10

## 2014-10-22 MED ORDER — VH PROPOFOL INFUSION 10 MG/ML (WRAPPED)
INTRAVENOUS | Status: DC | PRN
Start: 2014-10-22 — End: 2014-10-22
  Administered 2014-10-22: 20 mg via INTRAVENOUS
  Administered 2014-10-22: 70 mg via INTRAVENOUS

## 2014-10-22 MED ORDER — HYPROMELLOSE (GONIOSCOPIC) 2.5 % OP SOLN
OPHTHALMIC | Status: AC
Start: 2014-10-22 — End: ?
  Filled 2014-10-22: qty 15

## 2014-10-22 MED ORDER — THROMBIN 20000 UNITS EX KIT
PACK | CUTANEOUS | Status: DC | PRN
Start: 2014-10-22 — End: 2014-10-22
  Administered 2014-10-22: 40000 [IU] via TOPICAL

## 2014-10-22 MED ORDER — BUPIVACAINE HCL (PF) 0.75 % IJ SOLN
INTRAMUSCULAR | Status: AC
Start: 2014-10-22 — End: ?
  Filled 2014-10-22: qty 10

## 2014-10-22 MED ORDER — BSS PLUS IO SOLN
INTRAOCULAR | Status: AC
Start: 2014-10-22 — End: ?
  Filled 2014-10-22: qty 500

## 2014-10-22 MED ORDER — LACTATED RINGERS IV SOLN
INTRAVENOUS | Status: DC | PRN
Start: 2014-10-22 — End: 2014-10-22

## 2014-10-22 MED ORDER — BUPIVACAINE HCL (PF) 0.75 % IJ SOLN
INTRAMUSCULAR | Status: DC | PRN
Start: 2014-10-22 — End: 2014-10-22
  Administered 2014-10-22: 10 mL via RETROBULBAR

## 2014-10-22 MED ORDER — THROMBIN 5000 UNITS EX SOLR
CUTANEOUS | Status: DC | PRN
Start: 2014-10-22 — End: 2014-10-22
  Administered 2014-10-22: 10000 [IU] via TOPICAL

## 2014-10-22 MED ORDER — PHENYLEPHRINE HCL 2.5 % OP SOLN
1.0000 [drp] | OPHTHALMIC | Status: AC
Start: 2014-10-22 — End: 2014-10-22
  Administered 2014-10-22 (×3): 1 [drp] via OPHTHALMIC
  Filled 2014-10-22: qty 2

## 2014-10-22 MED ORDER — STERILE WATER FOR INJECTION IJ SOLN
INTRAMUSCULAR | Status: DC | PRN
Start: 2014-10-22 — End: 2014-10-22
  Administered 2014-10-22: 5 mL

## 2014-10-22 MED ORDER — BSS PLUS IO SOLN
INTRAOCULAR | Status: DC | PRN
Start: 2014-10-22 — End: 2014-10-22
  Administered 2014-10-22: 500 mL via INTRAOCULAR

## 2014-10-22 MED ORDER — THROMBIN 5000 UNITS EX SOLR
CUTANEOUS | Status: AC
Start: 2014-10-22 — End: ?
  Filled 2014-10-22: qty 10000

## 2014-10-22 MED ORDER — ONDANSETRON HCL 4 MG/2ML IJ SOLN
INTRAMUSCULAR | Status: AC
Start: 2014-10-22 — End: ?
  Filled 2014-10-22: qty 2

## 2014-10-22 MED ORDER — ONDANSETRON HCL 4 MG/2ML IJ SOLN
INTRAMUSCULAR | Status: DC | PRN
Start: 2014-10-22 — End: 2014-10-22
  Administered 2014-10-22: 4 mg via INTRAVENOUS

## 2014-10-22 MED ORDER — ACETAMINOPHEN 10 MG/ML IV SOLN
INTRAVENOUS | Status: DC | PRN
Start: 2014-10-22 — End: 2014-10-22
  Administered 2014-10-22: 1000 mg via INTRAVENOUS

## 2014-10-22 MED ORDER — DEXAMETHASONE SODIUM PHOSPHATE 4 MG/ML IJ SOLN
INTRAMUSCULAR | Status: DC | PRN
Start: 2014-10-22 — End: 2014-10-22
  Administered 2014-10-22: 4 mg via INTRAVENOUS

## 2014-10-22 MED ORDER — DEXTROSE 50 % IV SOLN
50.0000 mL | Freq: Once | INTRAVENOUS | Status: AC
Start: 2014-10-22 — End: 2014-10-22
  Administered 2014-10-22: 25 mL via INTRAVENOUS

## 2014-10-22 MED ORDER — PLASMA-LYTE 148 IV SOLN
INTRAVENOUS | Status: DC | PRN
Start: 2014-10-22 — End: 2014-10-22
  Administered 2014-10-22: 500 mL

## 2014-10-22 MED ORDER — FENTANYL CITRATE (PF) 50 MCG/ML IJ SOLN (WRAP)
INTRAMUSCULAR | Status: DC | PRN
Start: 2014-10-22 — End: 2014-10-22
  Administered 2014-10-22 (×2): 25 ug via INTRAVENOUS

## 2014-10-22 MED ORDER — TOBRAMYCIN 0.3 % OP SOLN
1.0000 [drp] | OPHTHALMIC | Status: AC
Start: 2014-10-22 — End: 2014-10-22
  Administered 2014-10-22 (×3): 1 [drp] via OPHTHALMIC
  Filled 2014-10-22: qty 5

## 2014-10-22 MED ORDER — BSS IO SOLN
INTRAOCULAR | Status: DC | PRN
Start: 2014-10-22 — End: 2014-10-22
  Administered 2014-10-22: 15 mL via TOPICAL

## 2014-10-22 MED ORDER — DEXAMETHASONE SODIUM PHOSPHATE 4 MG/ML IJ SOLN
INTRAMUSCULAR | Status: AC
Start: 2014-10-22 — End: ?
  Filled 2014-10-22: qty 1

## 2014-10-22 MED ORDER — HYPROMELLOSE (GONIOSCOPIC) 2.5 % OP SOLN
OPHTHALMIC | Status: DC | PRN
Start: 2014-10-22 — End: 2014-10-22
  Administered 2014-10-22: 5 mL via TOPICAL

## 2014-10-22 MED ORDER — LACTATED RINGERS IV SOLN
INTRAVENOUS | Status: DC
Start: 2014-10-22 — End: 2014-10-22

## 2014-10-22 MED ORDER — LIDOCAINE HCL 2 % IJ SOLN
INTRAMUSCULAR | Status: DC | PRN
Start: 2014-10-22 — End: 2014-10-22
  Administered 2014-10-22: 5 mL

## 2014-10-22 MED ORDER — TOBRAMYCIN 0.3 % OP OINT
TOPICAL_OINTMENT | OPHTHALMIC | Status: DC | PRN
Start: 2014-10-22 — End: 2014-10-22
  Administered 2014-10-22: 1 g via OPHTHALMIC

## 2014-10-22 MED ORDER — CEFAZOLIN SODIUM 1 G IJ SOLR
INTRAMUSCULAR | Status: DC | PRN
Start: 2014-10-22 — End: 2014-10-22
  Administered 2014-10-22: 1 g

## 2014-10-22 MED ORDER — FENTANYL CITRATE (PF) 50 MCG/ML IJ SOLN (WRAP)
INTRAMUSCULAR | Status: AC
Start: 2014-10-22 — End: ?
  Filled 2014-10-22: qty 2

## 2014-10-22 MED ORDER — ATROPINE SULFATE 1 % OP SOLN
1.0000 [drp] | OPHTHALMIC | Status: AC
Start: 2014-10-22 — End: 2014-10-22
  Administered 2014-10-22 (×3): 1 [drp] via OPHTHALMIC
  Filled 2014-10-22: qty 2

## 2014-10-22 MED ORDER — THROMBIN 20000 UNITS EX KIT
PACK | CUTANEOUS | Status: AC
Start: 2014-10-22 — End: ?
  Filled 2014-10-22: qty 2

## 2014-10-22 MED ORDER — STERILE WATER FOR INJECTION IJ SOLN
INTRAMUSCULAR | Status: AC
Start: 2014-10-22 — End: ?
  Filled 2014-10-22: qty 10

## 2014-10-22 SURGICAL SUPPLY — 22 items
APPLICATOR COTTONTIP 6" STER (Supply) ×1 IMPLANT
APPLICATOR COTTONTIP STER 6 (Supply) ×2 IMPLANT
CLEANER ANTI-FOG (Supply) ×2 IMPLANT
CORD DISPOSABLE BOVIE (Supply) ×1 IMPLANT
COVER MAYO STAND XL STERILE (Supply) ×2 IMPLANT
DRAPE RESIGHT MICROSCOPE (Supply) ×1 IMPLANT
DRAPE STERI EYE (Supply) ×1 IMPLANT
ERASER WETFIELD 25GA STRAIGHT (Supply) ×1 IMPLANT
GLOVE BIOGEL UT PI SURG SZ 7 (Supply) ×2 IMPLANT
GLOVE BIOGEL UT PI SURG SZ 8 (Supply) ×2 IMPLANT
GOWN STD LG 2/PK (Supply) ×2 IMPLANT
GOWN STD LG W/TOWEL REUSE (Supply) ×2 IMPLANT
PACK RETINAL CDS810023I CS/3 (Supply) ×2 IMPLANT
PAD ARMBOARD 20 X 8 (Supply) ×2 IMPLANT
PAK VITRECTOMY KIT (NEW ONE) (Supply) ×4 IMPLANT
POUCH INSTRUMENT (Supply) ×2 IMPLANT
PROBE LASER 25GA FLEXIBLE TIP (Supply) ×1 IMPLANT
SOL WATER STERILE IRRG 1000ML (Solutions) ×1 IMPLANT
SPONGE GAUZE 4 X 4 12 PLY (Dressings) ×2 IMPLANT
SUT SILK 4-0 ALCON (Supply) IMPLANT
SUT VICRYL 7-0 J576G (Supply) IMPLANT
TAPE MICROPORE 1 (PAPER) (Supply) ×2 IMPLANT

## 2014-10-22 NOTE — Anesthesia Postprocedure Evaluation (Signed)
Anesthesia Post Evaluation    Patient: Lydia Medina    Procedures performed: Procedure(s) with comments:  VITRECTOMY, 25 GAUGE, MECHANICAL - RIGHT PPV 25G LASER    Anesthesia type: MAC    Patient location:Phase II PACU    Last vitals:   Filed Vitals:    10/22/14 0829   BP: 151/61   Pulse: 69   Temp: 36.5 C (97.7 F)   Resp: 16   SpO2: 99%       Post pain: Patient not complaining of pain, continue current therapy      Mental Status:awake    Respiratory Function: tolerating room air    Cardiovascular: stable    Nausea/Vomiting: patient not complaining of nausea or vomiting    Hydration Status: adequate    Post assessment: no apparent anesthetic complications

## 2014-10-22 NOTE — Op Note (Signed)
FULL OPERATIVE NOTE    Date Time: 10/22/2014 8:22 AM  Patient Name: Lydia Medina, Lydia Medina  MRN: [id number]    Date of Operation:   10/22/2014    Providers Performing:   Dante Gang, MD    Assistant (s): Philmore Pali, CST    Operative Procedure:   Procedure(s):  VITRECTOMY, 25 GAUGE, MECHANICAL    Preoperative Diagnosis:   Pre-Op Diagnosis Codes:     * Retinal detachment, right [H33.21]    Postoperative Diagnosis:   Post-Op Diagnosis Codes:     * Retinal detachment, right [H33.21]    Indications:   Right Vitreous Hemorrhage, Non-diabetic    Operative Notes:   The patient was taken to the operating room on the vitreoretinal surgery stretcher. Local and retrobulbar anesthesia were delivered using a 50/50 mixture of 0.75% bupivacaine and 4% Xylocaine. 6 mL were delivered to the retrobulbar space using a long 25 gauge needle. The patient was prepped in sterile fashion using half strength Betadine solution to prep the side of the face, the eyelashes, the eyelids, and to irrigate the conjunctival fornices. After prepping and draping, a rigid lid speculum was placed. We performed the pause procedure.     Three sclerotomies were made with the same technique. In each instance, a point was measured 3 mm posterior to the limbus. The conjunctiva was displaced, and the trocar and cannula set was directed into the sclera in a direction tangential to the limbus. When the tip of the trocar had embedded into the sclera, the trocar and cannula set was redirected to a position perpendicular to the sclera, and the trocar was advanced into the vitreous cavity to the hilt of the cannula. The trocar was removed. One such sclerotomy was made in the inferotemporal quadrant where the infusion cannula was attached. Two additional sclerotomies were made in the 10:00 and 2:00 positions for the light pipe and vitrector.      There was no retinal detachment. Vitreous hemorrhage was present. There was active bleeding at the time of the surgery.  Panretinal laser photocoagulation was carried out using the flexible 25 gauge endolaser probe. A total of over 1500 spots were applied.The instruments and cannulas were removed and the wounds were noted to be water tight. There were no iatrogenic tears. The patient was given a subconjunctival injection of Ancef. Tobramycin ointment was placed in the eye. The eyelid speculum was removed. A patch and shield were placed over the eye. The patient was taken to AM Admits  in satisfactory condition.    Estimated Blood Loss:   Negligible     Specimens:  None    Complications:   * No complications entered in OR log *    Signed by: Dante Gang, MD

## 2014-10-22 NOTE — Anesthesia Preprocedure Evaluation (Signed)
Anesthesia Evaluation    AIRWAY    Mallampati: II    TM distance: >3 FB  Neck ROM: full  Mouth Opening:full   CARDIOVASCULAR    cardiovascular exam normal, regular and normal       DENTAL    no notable dental hx     PULMONARY    pulmonary exam normal and clear to auscultation     OTHER FINDINGS                      Anesthesia Plan    ASA 3     MAC                     Detailed anesthesia plan: MAC            informed consent obtained    Plan discussed with CRNA.      pertinent labs reviewed

## 2014-10-22 NOTE — Discharge Summary (Signed)
Physician Discharge Summary     Patient ID:  Lydia Medina  [id number]  73 y.o.  02/26/1941  PCP: Cline Cools, M.D.  Seefried, M.D.      Admit date: 10/22/2014    Discharge date: No discharge date for patient encounter.    Attending Physician: Dante Gang, MD     Discharge Diagnoses: Post-Op Diagnosis Codes:     * Retinal detachment, right [place] [facility] Course: The patient was admitted for vitrectomy with panretinal photocoagulation on the right eye.   The procedure was completed without incident and the patient recovered in stable condition. The patient will be discharged home on the following medications:  1. Atropine 1% ophthalmic solution 1 drop to operative eye once a day  2. Prednisolone Acetate 1% ophthalmic solution 1 drop to operative eye four times a day  3. Tobramycin ophthalmic solution 1 drop to operative eye four times a day  4. Tobradex ointment once at night to operative eye.  The patient was instructed to position as follows: Ad lib.  The patient and family are instructed to call our office at the number provided with any problems or concerns. She will follow up in my office tomorrow.     Signed:  Dante Gang, MD  10/22/2014  8:24 AM

## 2014-10-22 NOTE — Transfer of Care (Signed)
Anesthesia Transfer of Care Note    Patient: Lydia Medina    Last vitals:   Filed Vitals:    10/22/14 0829   BP: 151/61   Pulse: 68   Temp: 35.6 C (96.1 F)   Resp: 16   SpO2: 99%       Oxygen: Room Air     Mental Status:awake and alert     Airway: Natural    Cardiovascular Status:  stable    Verbal report given to RN at bedside.

## 2014-10-22 NOTE — Discharge Instr - Other Orders (Signed)
Patient already has appt for 7/6 at 1 pm. Patient instructed by Patty to remove her eye patch in the morning and to take her steroid eye drops. Patient has separate discharge sheet given to her from the office.

## 2014-10-22 NOTE — Anesthesia Postprocedure Evaluation (Signed)
Anesthesia Post Evaluation    Patient: Lydia Medina    Procedures performed: Procedure(s) with comments:  VITRECTOMY, 25 GAUGE, MECHANICAL - RIGHT PPV 25G LASER    Anesthesia type: MAC    Patient location:Phase II PACU    Last vitals:   Filed Vitals:    10/22/14 0829   BP: 151/61   Pulse: 68   Temp: 35.6 C (96.1 F)   Resp: 16   SpO2: 99%       Post pain: Patient not complaining of pain, continue current therapy      Mental Status:awake and alert     Respiratory Function: tolerating room air    Cardiovascular: stable    Nausea/Vomiting: patient not complaining of nausea or vomiting    Hydration Status: adequate    Post assessment: no apparent anesthetic complications and no reportable events

## 2014-10-22 NOTE — OR PostOp (Signed)
Patient received from the OR. Dr. Montez Morita by to check on patient. Patty in to see patient and redressed her eye. Patients BS was 111. Husband was called x 2. Patient given fluids and resting quietly.

## 2014-10-23 ENCOUNTER — Encounter: Payer: Self-pay | Admitting: Retina Specialist

## 2014-11-15 DIAGNOSIS — E1165 Type 2 diabetes mellitus with hyperglycemia: Secondary | ICD-10-CM | POA: Diagnosis not present

## 2014-11-19 DIAGNOSIS — I10 Essential (primary) hypertension: Secondary | ICD-10-CM | POA: Diagnosis not present

## 2014-11-19 DIAGNOSIS — E1040 Type 1 diabetes mellitus with diabetic neuropathy, unspecified: Secondary | ICD-10-CM | POA: Diagnosis not present

## 2014-11-19 DIAGNOSIS — E1065 Type 1 diabetes mellitus with hyperglycemia: Secondary | ICD-10-CM | POA: Diagnosis not present

## 2014-11-19 DIAGNOSIS — E7881 Lipoid dermatoarthritis: Secondary | ICD-10-CM | POA: Diagnosis not present

## 2014-11-19 DIAGNOSIS — E785 Hyperlipidemia, unspecified: Secondary | ICD-10-CM | POA: Diagnosis not present

## 2014-11-19 DIAGNOSIS — I25810 Atherosclerosis of coronary artery bypass graft(s) without angina pectoris: Secondary | ICD-10-CM | POA: Diagnosis not present

## 2015-02-13 DIAGNOSIS — E1065 Type 1 diabetes mellitus with hyperglycemia: Secondary | ICD-10-CM | POA: Diagnosis not present

## 2015-02-21 DIAGNOSIS — E7881 Lipoid dermatoarthritis: Secondary | ICD-10-CM | POA: Diagnosis not present

## 2015-02-21 DIAGNOSIS — I10 Essential (primary) hypertension: Secondary | ICD-10-CM | POA: Diagnosis not present

## 2015-02-21 DIAGNOSIS — E1065 Type 1 diabetes mellitus with hyperglycemia: Secondary | ICD-10-CM | POA: Diagnosis not present

## 2015-03-17 DIAGNOSIS — E109 Type 1 diabetes mellitus without complications: Secondary | ICD-10-CM | POA: Diagnosis not present

## 2015-04-08 DIAGNOSIS — B86 Scabies: Secondary | ICD-10-CM | POA: Diagnosis not present

## 2015-04-28 DIAGNOSIS — E103513 Type 1 diabetes mellitus with proliferative diabetic retinopathy with macular edema, bilateral: Secondary | ICD-10-CM | POA: Diagnosis not present

## 2015-04-28 DIAGNOSIS — H3342 Traction detachment of retina, left eye: Secondary | ICD-10-CM | POA: Diagnosis not present

## 2015-05-22 DIAGNOSIS — Z13820 Encounter for screening for osteoporosis: Secondary | ICD-10-CM | POA: Diagnosis not present

## 2015-05-22 DIAGNOSIS — Z1239 Encounter for other screening for malignant neoplasm of breast: Secondary | ICD-10-CM | POA: Diagnosis not present

## 2015-05-22 DIAGNOSIS — E109 Type 1 diabetes mellitus without complications: Secondary | ICD-10-CM | POA: Diagnosis not present

## 2015-06-03 DIAGNOSIS — E109 Type 1 diabetes mellitus without complications: Secondary | ICD-10-CM | POA: Diagnosis not present

## 2015-06-03 DIAGNOSIS — M8580 Other specified disorders of bone density and structure, unspecified site: Secondary | ICD-10-CM | POA: Diagnosis not present

## 2015-06-03 DIAGNOSIS — Z1231 Encounter for screening mammogram for malignant neoplasm of breast: Secondary | ICD-10-CM | POA: Diagnosis not present

## 2015-06-03 DIAGNOSIS — Z13820 Encounter for screening for osteoporosis: Secondary | ICD-10-CM | POA: Diagnosis not present

## 2015-09-23 DIAGNOSIS — E1121 Type 2 diabetes mellitus with diabetic nephropathy: Secondary | ICD-10-CM | POA: Diagnosis not present

## 2015-09-23 DIAGNOSIS — L02612 Cutaneous abscess of left foot: Secondary | ICD-10-CM | POA: Diagnosis not present

## 2015-09-23 DIAGNOSIS — L03112 Cellulitis of left axilla: Secondary | ICD-10-CM | POA: Diagnosis not present

## 2015-09-23 DIAGNOSIS — M79675 Pain in left toe(s): Secondary | ICD-10-CM | POA: Diagnosis not present

## 2015-09-26 DIAGNOSIS — H3342 Traction detachment of retina, left eye: Secondary | ICD-10-CM | POA: Diagnosis not present

## 2015-09-26 DIAGNOSIS — H35373 Puckering of macula, bilateral: Secondary | ICD-10-CM | POA: Diagnosis not present

## 2015-09-26 DIAGNOSIS — E103513 Type 1 diabetes mellitus with proliferative diabetic retinopathy with macular edema, bilateral: Secondary | ICD-10-CM | POA: Diagnosis not present

## 2015-09-26 DIAGNOSIS — E1065 Type 1 diabetes mellitus with hyperglycemia: Secondary | ICD-10-CM | POA: Diagnosis not present

## 2015-10-02 DIAGNOSIS — M216X2 Other acquired deformities of left foot: Secondary | ICD-10-CM | POA: Diagnosis not present

## 2015-10-02 DIAGNOSIS — L602 Onychogryphosis: Secondary | ICD-10-CM | POA: Diagnosis not present

## 2015-10-02 DIAGNOSIS — L02612 Cutaneous abscess of left foot: Secondary | ICD-10-CM | POA: Diagnosis not present

## 2015-10-02 DIAGNOSIS — E119 Type 2 diabetes mellitus without complications: Secondary | ICD-10-CM | POA: Diagnosis not present

## 2015-10-02 DIAGNOSIS — M216X1 Other acquired deformities of right foot: Secondary | ICD-10-CM | POA: Diagnosis not present

## 2015-10-02 DIAGNOSIS — L84 Corns and callosities: Secondary | ICD-10-CM | POA: Diagnosis not present

## 2015-10-03 DIAGNOSIS — I25810 Atherosclerosis of coronary artery bypass graft(s) without angina pectoris: Secondary | ICD-10-CM | POA: Diagnosis not present

## 2015-10-03 DIAGNOSIS — E1040 Type 1 diabetes mellitus with diabetic neuropathy, unspecified: Secondary | ICD-10-CM | POA: Diagnosis not present

## 2015-10-03 DIAGNOSIS — E1065 Type 1 diabetes mellitus with hyperglycemia: Secondary | ICD-10-CM | POA: Diagnosis not present

## 2015-10-03 DIAGNOSIS — I10 Essential (primary) hypertension: Secondary | ICD-10-CM | POA: Diagnosis not present

## 2015-10-03 DIAGNOSIS — E7881 Lipoid dermatoarthritis: Secondary | ICD-10-CM | POA: Diagnosis not present

## 2015-10-03 DIAGNOSIS — E785 Hyperlipidemia, unspecified: Secondary | ICD-10-CM | POA: Diagnosis not present

## 2015-11-03 DIAGNOSIS — E1351 Other specified diabetes mellitus with diabetic peripheral angiopathy without gangrene: Secondary | ICD-10-CM | POA: Diagnosis not present

## 2015-11-03 DIAGNOSIS — L602 Onychogryphosis: Secondary | ICD-10-CM | POA: Diagnosis not present

## 2015-11-03 DIAGNOSIS — I70293 Other atherosclerosis of native arteries of extremities, bilateral legs: Secondary | ICD-10-CM | POA: Diagnosis not present

## 2015-11-03 DIAGNOSIS — L84 Corns and callosities: Secondary | ICD-10-CM | POA: Diagnosis not present

## 2015-12-01 DIAGNOSIS — E1021 Type 1 diabetes mellitus with diabetic nephropathy: Secondary | ICD-10-CM | POA: Diagnosis not present

## 2015-12-01 DIAGNOSIS — E1351 Other specified diabetes mellitus with diabetic peripheral angiopathy without gangrene: Secondary | ICD-10-CM | POA: Diagnosis not present

## 2015-12-01 DIAGNOSIS — I70293 Other atherosclerosis of native arteries of extremities, bilateral legs: Secondary | ICD-10-CM | POA: Diagnosis not present

## 2015-12-01 DIAGNOSIS — L84 Corns and callosities: Secondary | ICD-10-CM | POA: Diagnosis not present

## 2015-12-08 ENCOUNTER — Other Ambulatory Visit (HOSPITAL_COMMUNITY): Payer: Self-pay | Admitting: Foot & Ankle Surgery

## 2015-12-08 ENCOUNTER — Ambulatory Visit (HOSPITAL_COMMUNITY)
Admission: RE | Admit: 2015-12-08 | Discharge: 2015-12-08 | Disposition: A | Discharge status: Home / Self Care | Payer: Medicare Other | Source: Ambulatory Visit | Attending: Cardiology | Admitting: Cardiology

## 2015-12-08 DIAGNOSIS — I771 Stricture of artery: Secondary | ICD-10-CM | POA: Diagnosis not present

## 2015-12-08 DIAGNOSIS — I739 Peripheral vascular disease, unspecified: Secondary | ICD-10-CM | POA: Insufficient documentation

## 2015-12-08 DIAGNOSIS — I708 Atherosclerosis of other arteries: Secondary | ICD-10-CM | POA: Insufficient documentation

## 2015-12-08 DIAGNOSIS — I700 Atherosclerosis of aorta: Secondary | ICD-10-CM | POA: Insufficient documentation

## 2016-01-06 ENCOUNTER — Ambulatory Visit (INDEPENDENT_AMBULATORY_CARE_PROVIDER_SITE_OTHER): Payer: Medicare Other | Admitting: Cardiovascular Disease

## 2016-01-06 ENCOUNTER — Encounter: Payer: Self-pay | Admitting: Cardiovascular Disease

## 2016-01-06 VITALS — BP 120/60 | HR 81 | Ht 64.0 in | Wt 153.8 lb

## 2016-01-06 DIAGNOSIS — I739 Peripheral vascular disease, unspecified: Secondary | ICD-10-CM

## 2016-01-06 DIAGNOSIS — I10 Essential (primary) hypertension: Secondary | ICD-10-CM

## 2016-01-06 DIAGNOSIS — I25118 Atherosclerotic heart disease of native coronary artery with other forms of angina pectoris: Secondary | ICD-10-CM

## 2016-01-06 DIAGNOSIS — I779 Disorder of arteries and arterioles, unspecified: Secondary | ICD-10-CM

## 2016-01-06 DIAGNOSIS — E785 Hyperlipidemia, unspecified: Secondary | ICD-10-CM

## 2016-01-06 NOTE — Progress Notes (Signed)
Cardiology Office Note   Date:  01/06/2016   ID:  Natasha Hernandez, DOB 11/09/1940, MRN [id number]  PCP:  No PCP Per Patient  Cardiologist:  New  Chief Complaint  Patient presents with  . New Patient (Initial Visit)      History of Present Illness: Natasha Hernandez is a 75 y.o. female who Is here today to establish cardiovascular care. She moved recently from [place] to be close to her family. She has extensive medical problems that include coronary artery disease status post CABG in 1996 at [facility], type 1 diabetes since she was 75 years old, hypertension, hyperlipidemia, peripheral arterial disease and carotid artery disease. Most recent cardiac catheterization was done in 2013 which showed patent grafts including SVG to OM, SVG to diagonal and SVG to PDA. LIMA to LAD was also patent. Most recent carotid Doppler in 2015 showed an occluded right carotid artery. She is a lifelong nonsmoker. There is no family history of coronary artery disease. She has stable mild chest pain with over exertion with no recent worsening. She is known to have peripheral arterial disease with no significant claudication. She has symptoms of neuropathy. Recent noninvasive vascular evaluation showed an ABI of 0.85 on the right and 1.1 on the left. Duplex showed no obstructive aortoiliac or SFA disease. There was one-vessel runoff below the knee bilaterally.   Past Medical History:  Diagnosis Date  . Carotid artery occlusion   . Coronary artery disease    CABG in 1996. Most recent cardiac catheterization in 2013 showed patent grafts including LIMA to LAD, SVG to OM, SVG to diagonal and SVG to PDA.  . Diabetes mellitus without complication (HCC)   . Hyperlipidemia   . Hypertension   . PAD (peripheral artery disease) (HCC)     Past Surgical History:  Procedure Laterality Date  . CORONARY ARTERY BYPASS GRAFT  1996     Current Outpatient Prescriptions  Medication Sig Dispense  Refill  . aspirin 325 MG tablet Take 325 mg by mouth daily.    . Calcium Carbonate (CALCIUM 600 PO) Take 1 tablet by mouth daily.    . carvedilol (COREG) 6.25 MG tablet Take 6.25 mg by mouth 2 (two) times daily with a meal.    . Coenzyme Q10 (CO Q 10 PO) Take 1 capsule by mouth daily.    . insulin aspart (NOVOLOG) 100 UNIT/ML injection Inject 2-15 Units into the skin 3 (three) times daily before meals. Based on sliding scale    . insulin glargine (LANTUS) 100 UNIT/ML injection Inject 6 Units into the skin 2 (two) times daily.    . isosorbide mononitrate (IMDUR) 60 MG 24 hr tablet Take 90 mg by mouth daily.    Marland Kitchen. lisinopril (PRINIVIL,ZESTRIL) 20 MG tablet Take 20 mg by mouth daily.    . Multiple Vitamin (MULTIVITAMIN) tablet Take 1 tablet by mouth daily.    . Nitroglycerin (NITROSTAT SL) Place 1 spray under the tongue as directed. Spray 1 dose unter tongue at onset of chest pain. Repeat every 5 minutes up to 3 doses. Call 911 after first dose    . simvastatin (ZOCOR) 20 MG tablet Take 10 mg by mouth daily.    Marland Kitchen. VITAMIN E COMPLEX PO Take 1 capsule by mouth daily.     No current facility-administered medications for this visit.     Allergies:   Review of patient's allergies indicates not on file.    Social History:  The patient  Family History:  The patient's Family history is negative for coronary artery disease.   ROS:  Please see the history of present illness.   Otherwise, review of systems are positive for none.   All other systems are reviewed and negative.    PHYSICAL EXAM: VS:  BP 120/60   Pulse 81   Ht 5\' 4"  (1.626 m)   Wt 153 lb 12.8 oz (69.8 kg)   BMI 26.40 kg/m  , BMI Body mass index is 26.4 kg/m. GEN: Well nourished, well developed, in no acute distress  HEENT: normal  Neck: no JVD, carotid bruits, or masses Cardiac: RRR; no murmurs, rubs, or gallops,no edema  Respiratory:  clear to auscultation bilaterally, normal work of breathing GI: soft, nontender,  nondistended, + BS MS: no deformity or atrophy  Skin: warm and dry, no rash Neuro:  Strength and sensation are intact Psych: euthymic mood, full affect Vascular: Femoral pulses are normal. Dorsalis pedis is palpable bilaterally.  EKG:  EKG is ordered today. The ekg ordered today demonstrates normal sinus rhythm with left axis deviation.   Recent Labs: No results found for requested labs within last 8760 hours.    Lipid Panel No results found for: CHOL, TRIG, HDL, CHOLHDL, VLDL, LDLCALC, LDLDIRECT    Wt Readings from Last 3 Encounters:  01/06/16 153 lb 12.8 oz (69.8 kg)       No flowsheet data found.    ASSESSMENT AND PLAN:  1.  Coronary artery disease involving native coronary arteries with mild stable angina: No recent worsening in symptoms. Continue medical therapy. Most recent cardiac catheterization in 2013 showed patent grafts. I might consider a stress test in the near future given old grafts and prolonged history of diabetes.  2. Peripheral arterial disease: Currently with no significant claudication and no evidence of critical limb ischemia. Continue medical therapy. We discussed the importance of proper foot hygiene. She does have deformities in her feet and usually requires orthotics.  3. 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 No recent evaluation. I requested carotid Doppler.   4. Hyperlipidemia: Continue treatment with simvastatin with a target LDL of less than 70.  5. Essential hypertension: Blood pressure is controlled on current medications.    Disposition:   FU with me in 6 months  Signed,  Lorine Bears, MD  01/06/2016 5:12 PM    [facility]

## 2016-01-06 NOTE — Patient Instructions (Signed)
Medication Instructions:  Your physician recommends that you continue on your current medications as directed. Please refer to the Current Medication list given to you today.  Labwork: No new orders.   Testing/Procedures: Your physician has requested that you have a carotid duplex. This test is an ultrasound of the carotid arteries in your neck. It looks at blood flow through these arteries that supply the brain with blood. Allow one hour for this exam. There are no restrictions or special instructions.  Follow-Up: Your physician wants you to follow-up in: 6 MONTHS with Dr Kirke CorinArida.  You will receive a reminder letter in the mail two months in advance. If you don't receive a letter, please call our office to schedule the follow-up appointment.   Any Other Special Instructions Will Be Listed Below (If Applicable).     If you need a refill on your cardiac medications before your next appointment, please call your pharmacy.

## 2016-01-16 ENCOUNTER — Ambulatory Visit (HOSPITAL_COMMUNITY)
Admission: RE | Admit: 2016-01-16 | Discharge: 2016-01-16 | Disposition: A | Discharge status: Home / Self Care | Payer: Medicare Other | Source: Ambulatory Visit | Attending: Cardiology | Admitting: Cardiology

## 2016-01-16 DIAGNOSIS — I2510 Atherosclerotic heart disease of native coronary artery without angina pectoris: Secondary | ICD-10-CM | POA: Diagnosis not present

## 2016-01-16 DIAGNOSIS — Z951 Presence of aortocoronary bypass graft: Secondary | ICD-10-CM | POA: Diagnosis not present

## 2016-01-16 DIAGNOSIS — E1151 Type 2 diabetes mellitus with diabetic peripheral angiopathy without gangrene: Secondary | ICD-10-CM | POA: Diagnosis not present

## 2016-01-16 DIAGNOSIS — E785 Hyperlipidemia, unspecified: Secondary | ICD-10-CM | POA: Diagnosis not present

## 2016-01-16 DIAGNOSIS — I10 Essential (primary) hypertension: Secondary | ICD-10-CM | POA: Insufficient documentation

## 2016-01-16 DIAGNOSIS — I6523 Occlusion and stenosis of bilateral carotid arteries: Secondary | ICD-10-CM | POA: Insufficient documentation

## 2016-01-16 DIAGNOSIS — I739 Peripheral vascular disease, unspecified: Secondary | ICD-10-CM | POA: Diagnosis not present

## 2016-01-16 DIAGNOSIS — I779 Disorder of arteries and arterioles, unspecified: Secondary | ICD-10-CM

## 2016-01-26 ENCOUNTER — Other Ambulatory Visit: Payer: Self-pay | Admitting: *Deleted

## 2016-01-26 DIAGNOSIS — L84 Corns and callosities: Secondary | ICD-10-CM | POA: Diagnosis not present

## 2016-01-26 DIAGNOSIS — I70293 Other atherosclerosis of native arteries of extremities, bilateral legs: Secondary | ICD-10-CM | POA: Diagnosis not present

## 2016-01-26 DIAGNOSIS — L602 Onychogryphosis: Secondary | ICD-10-CM | POA: Diagnosis not present

## 2016-01-26 DIAGNOSIS — E1351 Other specified diabetes mellitus with diabetic peripheral angiopathy without gangrene: Secondary | ICD-10-CM | POA: Diagnosis not present

## 2016-01-26 MED ORDER — CARVEDILOL 6.25 MG PO TABS: 6.2500 mg | ORAL_TABLET | Freq: Two times a day (BID) | ORAL | 3 refills | Status: DC

## 2016-01-27 ENCOUNTER — Telehealth: Payer: Self-pay | Admitting: Interventional Cardiology

## 2016-01-27 MED ORDER — NITROGLYCERIN 0.4 MG/SPRAY TL SOLN: 1.0000 | 1 refills | Status: DC | PRN

## 2016-01-27 NOTE — Telephone Encounter (Signed)
Discussed with Dr Kirke CorinArida and the pt does not need nitroglycerin patches.  The pt's NTG spray can be refilled. If the pt's angina changes or worsens then she should contact the office. I spoke with Merry ProudBrandi and made her aware of Dr Jari SportsmanArida's recommendation.  NTG spray refill sent to the pharmacy.

## 2016-01-27 NOTE — Telephone Encounter (Signed)
Pt's daughter is calling requesting refills on pt's nitroglycerin spray and nitroglycerin patches. There is no strength listed on the nitroglycerin spray and the nitroglycerin patches is not listed. Please advise

## 2016-01-27 NOTE — Telephone Encounter (Signed)
I spoke with the pt's daughter and she contacted the office to get a refill on the pt's Nitroglycerin patch 0.4mg /hr patch daily and Nitroglycerin spray 46600mcg/spray.  She said the patches expired in March of 2016 and the pt has not been using them on a regular basis.  When the pt wears them she keeps them on for about 5-6 hours and then takes them off but they are not working anymore since they are old.  The pt's NTG spray expired in October 2014.  The pt has not had any change in her angina since her appointment with Dr Kirke CorinArida.  The pt just needs a refill on her medications when she has breakthrough angina. The pt currently takes Isosorbide MN 90mg  daily.  I will discuss with Dr Kirke CorinArida and call the pt's daughter back.

## 2016-02-16 ENCOUNTER — Other Ambulatory Visit: Payer: Self-pay | Admitting: *Deleted

## 2016-02-16 MED ORDER — ISOSORBIDE MONONITRATE ER 60 MG PO TB24: 90.0000 mg | ORAL_TABLET | Freq: Every day | ORAL | 10 refills | Status: DC

## 2016-02-16 NOTE — Telephone Encounter (Signed)
Dr Kirke CorinArida has never filled this for the patient. Okay to refill?

## 2016-02-16 NOTE — Telephone Encounter (Signed)
Okay to prescribe?

## 2016-03-10 ENCOUNTER — Other Ambulatory Visit: Payer: Self-pay | Admitting: Interventional Cardiology

## 2016-03-10 MED ORDER — LISINOPRIL 20 MG PO TABS: 20.0000 mg | ORAL_TABLET | Freq: Every day | ORAL | 3 refills | Status: DC

## 2016-03-13 DIAGNOSIS — L03119 Cellulitis of unspecified part of limb: Secondary | ICD-10-CM | POA: Diagnosis not present

## 2016-03-13 DIAGNOSIS — L259 Unspecified contact dermatitis, unspecified cause: Secondary | ICD-10-CM | POA: Diagnosis not present

## 2016-03-29 DIAGNOSIS — L602 Onychogryphosis: Secondary | ICD-10-CM | POA: Diagnosis not present

## 2016-03-29 DIAGNOSIS — L84 Corns and callosities: Secondary | ICD-10-CM | POA: Diagnosis not present

## 2016-03-29 DIAGNOSIS — E1151 Type 2 diabetes mellitus with diabetic peripheral angiopathy without gangrene: Secondary | ICD-10-CM | POA: Diagnosis not present

## 2016-04-02 ENCOUNTER — Encounter: Payer: Self-pay | Admitting: Endocrinology

## 2016-04-02 ENCOUNTER — Ambulatory Visit (INDEPENDENT_AMBULATORY_CARE_PROVIDER_SITE_OTHER): Payer: Medicare Other | Admitting: Endocrinology

## 2016-04-02 VITALS — BP 128/72 | HR 84 | Ht 64.0 in | Wt 155.0 lb

## 2016-04-02 DIAGNOSIS — I25118 Atherosclerotic heart disease of native coronary artery with other forms of angina pectoris: Secondary | ICD-10-CM | POA: Diagnosis not present

## 2016-04-02 DIAGNOSIS — E1042 Type 1 diabetes mellitus with diabetic polyneuropathy: Secondary | ICD-10-CM

## 2016-04-02 DIAGNOSIS — E1065 Type 1 diabetes mellitus with hyperglycemia: Secondary | ICD-10-CM | POA: Diagnosis not present

## 2016-04-02 DIAGNOSIS — R5383 Other fatigue: Secondary | ICD-10-CM

## 2016-04-02 DIAGNOSIS — E7800 Pure hypercholesterolemia, unspecified: Secondary | ICD-10-CM | POA: Diagnosis not present

## 2016-04-02 LAB — POCT GLYCOSYLATED HEMOGLOBIN (HGB A1C): HEMOGLOBIN A1C: 6.6

## 2016-04-02 MED ORDER — GLUCOSE BLOOD VI STRP: ORAL_STRIP | 1 refills | Status: DC

## 2016-04-02 NOTE — Patient Instructions (Addendum)
Reduce lantus to 5 units and take at 8 am and 8 pm  Check on coverage for TRESIBA insulin  High sugars: Take the following doses of extra Novolog for correction  GLUCOSE: 100-150: Does not take any extra 150-199: Take 1 unit extra 200-249: Take 2 units extra Over 250 take 2 units  GLUCOSE below 100: Subtract 1 unit  New information has been given today for the following:  Medtronic 670 insulin pump Freestyle Libre system for glucose monitoring DexCom sensor

## 2016-04-02 NOTE — Progress Notes (Signed)
Patient ID: Natasha Hernandez, female   DOB: [birth year], 75 y.o.   MRN: [id number]            Reason for Appointment : Consultation for Type 1 Diabetes  History of Present Illness          Diagnosis: Type 1 diabetes mellitus, date of diagnosis: 1953          Previous history:  She thinks she has been on various insulin regimens over the years but has been on Lantus for 10-12 years along with mealtime insulin Previously followed by an endocrinologist but no records available. Her A1c has usually been around 7% reportedly and was 7.2 in 7/16   Recent history:     INSULIN regimen is: NOVOLOG carbohydrate coverage 1:5 and 1: 50 correction factor at meals Lantus 6 units at breakfast and bedtime  Current management, blood sugar patterns and problems identified:    She is using a Generic [employer] meter and unable to download this.  She has 3 different monitors her use at various places  Blood sugars were reviewed from her home diary and most of her blood sugars are near normal  She has occasional low blood sugars fasting and before supper time, more before suppertime  Bedtime readings are usually not significantly out of range, mostly 120-170  Fasting readings are mostly 100-160  She is trying to use carbohydrate counting to cover her meals and she thinks she has books to look this up or her family can look it up on line 4 outside meals.  Although she thinks she is doing a correction of 1:50 she will take 3 units if blood sugar is 200  She does not always recognize low blood sugar symptoms and make it confused.  She is usually rotating her injection sites and using different locations  Usually trying to take NovoLog right before eating         Glucose monitoring:  is being done 3-5 times a day         Glucometer:  [employer] brand Blood Glucose readings as above, meter could not be downloaded  Hypoglycemia:  occurs at suppertime mostly, occasionally early morning, usually not  overnight Factors causing hyperglycemia: Decrease intake and increased insulin coverage Symptoms of hypoglycemia: Some weakness, confusion at times Treatment of hypoglycemia: Corn syrup or jellybeans/other sweets.  She will get 15 g of carbohydrate  Self-care: The diet that the patient has been following is: Carbohydrate counting  Mealtimes are:: Dinner: 7.30   For breakfast will have dosed with cheese or egg, sometimes yogurt with walnuts Recently eating out about 4 times a week, sometimes fast food         Exercise: less recently otherwise some walking and exercise bike         Dietician consultation: Most recent: Years ago .         CDE consultation: Unknown  Diabetes labs: Not available  Lab Results  Component Value Date   HGBA1C 6.6 04/02/2016   No results found for: GLUF, MICROALBUR, LDLCALC, CREATININE  No results found for: MICRALBCREAT   Allergies as of 04/02/2016   Not on File     Medication List       Accurate as of 04/02/16  1:09 PM. Always use your most recent med list.          aspirin 325 MG tablet Take 325 mg by mouth daily.   CALCIUM 600 PO Take 1 tablet by mouth daily.   carvedilol 6.25  MG tablet Commonly known as:  COREG Take 1 tablet (6.25 mg total) by mouth 2 (two) times daily with a meal.   CO Q 10 PO Take 1 capsule by mouth daily.   insulin aspart 100 UNIT/ML injection Commonly known as:  novoLOG Inject 2-15 Units into the skin 3 (three) times daily before meals. Based on sliding scale   insulin glargine 100 UNIT/ML injection Commonly known as:  LANTUS Inject 6 Units into the skin 2 (two) times daily.   isosorbide mononitrate 60 MG 24 hr tablet Commonly known as:  IMDUR Take 1.5 tablets (90 mg total) by mouth daily.   lisinopril 20 MG tablet Commonly known as:  PRINIVIL,ZESTRIL Take 1 tablet (20 mg total) by mouth daily.   multivitamin tablet Take 1 tablet by mouth daily.   nitroGLYCERIN 0.4 MG/SPRAY spray Commonly known as:   NITROLINGUAL Place 1 spray under the tongue every 5 (five) minutes x 3 doses as needed for chest pain (Call 911 if taking 3rd spray).   simvastatin 40 MG tablet Commonly known as:  ZOCOR Take 40 mg by mouth daily.   VITAMIN E COMPLEX PO Take 1 capsule by mouth daily.       Allergies: Not on File  Past Medical History:  Diagnosis Date  . Carotid artery occlusion   . Coronary artery disease    CABG in 1996. Most recent cardiac catheterization in 2013 showed patent grafts including LIMA to LAD, SVG to OM, SVG to diagonal and SVG to PDA.  . Diabetes mellitus without complication (HCC)   . Hyperlipidemia   . Hypertension   . PAD (peripheral artery disease) (HCC)     Past Surgical History:  Procedure Laterality Date  . CORONARY ARTERY BYPASS GRAFT  1996    No family history on file.  Social History:  reports that she has never smoked. She has never used smokeless tobacco. Her alcohol and drug histories are not on file.      Review of Systems  Constitutional: Negative for weight loss and reduced appetite.  HENT: Negative for headaches.   Respiratory: Negative for shortness of breath.   Cardiovascular: Positive for chest pain. Negative for claudication.  Gastrointestinal: Positive for constipation. Negative for nausea.  Endocrine: Positive for fatigue and cold intolerance. Negative for polydipsia.       Sometimes will be like sleeping during the day  Genitourinary: Negative for frequency.  Musculoskeletal: Positive for joint pain. Negative for back pain.       Small joints pain at times  Skin: Negative for rash.  Neurological: Positive for numbness. Negative for tingling and balance difficulty.       She has numbness in her lower legs for few years  Psychiatric/Behavioral: Negative for insomnia.        Lipids:   Retino prol LABS:  Office Visit on 04/02/2016  Component Date Value Ref Range Status  . Hemoglobin A1C 04/02/2016 6.6   Final    Physical  Examination:  BP 128/72   Pulse 84   Ht 5\' 4"  (1.626 m)   Wt 155 lb (70.3 kg)   SpO2 97%   BMI 26.61 kg/m   GENERAL: Averagely built and nourished. Her face is somewhat puffy HEENT:         Eye exam shows normal external appearance. Fundus exam is not adequate, some laser scars seen on the left otherwise shows no gross retinopathy. Oral exam shows normal mucosa .  NECK:  there is no lymphadenopathy.   Thyroid is not enlarged and no nodules felt. Has prominent left carotid bruit present    LUNGS:         Chest is symmetrical. Lungs are clear to auscultation.Marland Kitchen.   HEART:         Heart sounds:  S1 and S2 are normal. No murmurs or clicks heard., no S3 or S4.   ABDOMEN:  no distention present. Liver and spleen are not palpable. No other mass or tenderness present.  EXTREMITIES:     There is no edema. No skin lesions present.Marland Kitchen.  NEUROLOGICAL:        Vibration sense is absent  in toes. Ankle jerks are absent bilaterally.   Biceps reflexes appear to show slow relaxation     Diabetic Foot Exam - Simple   Simple Foot Form Diabetic Foot exam was performed with the following findings:  Yes 04/02/2016 12:27 PM  Visual Inspection No deformities, no ulcerations, no other skin breakdown bilaterally:  Yes See comments:  Yes Sensation Testing See comments:  Yes Pulse Check See comments:  Yes Comments Absent monofilament sensation in the feet Absent pedal pulses Skin is shiny and thin on the lower legs and feet  Hammertoes present         MUSCULOSKELETAL:   She has mild swelling is symmetrically of finger joints. SKIN:       No rash, lesions or abnormal pigmentation.  Some bruising of left upper abdomen area at injection site       ASSESSMENT:  Diabetes type 1, long-standing with overall fairly good control and A1c usually near 7% A1c is today 6.6 She usually well controlled with basal bolus insulin regimen including twice a day Lantus Appears to be fairly comfortable doing  carbohydrate counting for mealtime insulin  Problems identified:  Some variability in blood sugars at all times including fasting.  Some of this may be related to her using Lantus insulin  Tendency to unexpected hypoglycemia at times either early morning or before supper  Hypoglycemia unawareness.  She is probably getting excessive hyperglycemia considering her age and duration of diabetes and may be getting asymptomatic hypoglycemia at times  She is unclear about how she is doing correction doses of insulin and probably getting excessive doses for high readings  Not reducing mealtime insulin when blood sugar is low normal   She is complaining about difficulty with testing blood sugars frequently and is currently using a Generic monitor that cannot be downloaded  Complications: Severe peripheral neuropathy with sensory loss, history of proliferative retinopathy, macrovascular disease, unknown status of microalbuminuria  LIPIDS: Baseline lipids not available but considering her diabetes and significant macrovascular disease she needs to be on high intensity of the statin doses and currently taking only 20 mg, reportedly no intolerance to these  History of coronary artery disease, carotid atherosclerosis, peripheral vascular disease  FATIGUE, cold intolerance and features of hypothyroidism on exam, need to rule out hypothyroidism   PLAN:    Reduce Lantus to 5 units twice a day and take the doses 12 hours apart.  This should hopefully reduce her tendency to hypoglycemia before breakfast and supper  With her comorbid conditions, age, duration of diabetes and hypoglycemia unawareness she needs to have a blood sugar target of 120-150 before meals  Start using the Accu-Chek guide meter.  Discussed that this should be covered by Medicare.  She will be seeing the nurse educator to be instructed on using the meter and all the  features including the bolus advisor  Consider continuous  glucose monitoring.  Discussed various options currently available including the [facility] and the DexCom.  Since her daughter in law would like to keep in on her when she is not home she may benefit from using the DexCom G-5 systems.  Literature given on both  Consider insulin pump especially the Medtronic 670, this will provide more optimal blood sugar control, mention of hypoglycemia.  Discussed that the pump is safe, easy-to-use and she is already educated enough to do bolus calculations  She will do correction only for blood sugars over 150 and given her specific doses to take for high readings  Use calorie Brooke Dare app to look up carbohydrate content.  Discuss insulin pump with nurse educator and also her family needs to be trained to do glucagon injections  Increase SIMVASTATIN to 40 mg  Check labs on the next visit: Lipids, chemistry, thyroid levels, microalbumin   Patient Instructions  Reduce lantus to 5 units and take at 8 am and 8 pm  Check on coverage for TRESIBA insulin  High sugars: Take the following doses of extra Novolog for correction  GLUCOSE: 100-150: Does not take any extra 150-199: Take 1 unit extra 200-249: Take 2 units extra Over 250 take 2 units  GLUCOSE below 100: Subtract 1 unit  New information has been given today for the following:  Medtronic 670 insulin pump Freestyle Libre system for glucose monitoring DexCom sensor     Counseling time on subjects discussed above is over 50% of today's 60 minute visit    Jalonda Antigua 04/02/2016, 1:09 PM   Note: This note was prepared with Dragon voice recognition system technology. Any transcriptional errors that result from this process are unintentional.

## 2016-04-07 ENCOUNTER — Emergency Department (HOSPITAL_COMMUNITY)
Admission: EM | Admit: 2016-04-07 | Discharge: 2016-04-07 | Disposition: A | Discharge status: Home / Self Care | Payer: Medicare Other | Attending: Emergency Medicine | Admitting: Emergency Medicine

## 2016-04-07 ENCOUNTER — Encounter (HOSPITAL_COMMUNITY): Payer: Self-pay | Admitting: Neurology

## 2016-04-07 ENCOUNTER — Emergency Department (HOSPITAL_COMMUNITY): Payer: Medicare Other

## 2016-04-07 DIAGNOSIS — Z7982 Long term (current) use of aspirin: Secondary | ICD-10-CM | POA: Insufficient documentation

## 2016-04-07 DIAGNOSIS — Y999 Unspecified external cause status: Secondary | ICD-10-CM | POA: Diagnosis not present

## 2016-04-07 DIAGNOSIS — Z794 Long term (current) use of insulin: Secondary | ICD-10-CM | POA: Insufficient documentation

## 2016-04-07 DIAGNOSIS — S52501A Unspecified fracture of the lower end of right radius, initial encounter for closed fracture: Secondary | ICD-10-CM | POA: Diagnosis not present

## 2016-04-07 DIAGNOSIS — Z79899 Other long term (current) drug therapy: Secondary | ICD-10-CM | POA: Insufficient documentation

## 2016-04-07 DIAGNOSIS — S62109A Fracture of unspecified carpal bone, unspecified wrist, initial encounter for closed fracture: Secondary | ICD-10-CM

## 2016-04-07 DIAGNOSIS — Y929 Unspecified place or not applicable: Secondary | ICD-10-CM | POA: Insufficient documentation

## 2016-04-07 DIAGNOSIS — E119 Type 2 diabetes mellitus without complications: Secondary | ICD-10-CM | POA: Insufficient documentation

## 2016-04-07 DIAGNOSIS — S52571A Other intraarticular fracture of lower end of right radius, initial encounter for closed fracture: Secondary | ICD-10-CM | POA: Diagnosis not present

## 2016-04-07 DIAGNOSIS — S62101A Fracture of unspecified carpal bone, right wrist, initial encounter for closed fracture: Secondary | ICD-10-CM | POA: Diagnosis not present

## 2016-04-07 DIAGNOSIS — Z951 Presence of aortocoronary bypass graft: Secondary | ICD-10-CM | POA: Insufficient documentation

## 2016-04-07 DIAGNOSIS — W1839XA Other fall on same level, initial encounter: Secondary | ICD-10-CM | POA: Diagnosis not present

## 2016-04-07 DIAGNOSIS — Y939 Activity, unspecified: Secondary | ICD-10-CM | POA: Diagnosis not present

## 2016-04-07 DIAGNOSIS — I2510 Atherosclerotic heart disease of native coronary artery without angina pectoris: Secondary | ICD-10-CM | POA: Diagnosis not present

## 2016-04-07 DIAGNOSIS — S6991XA Unspecified injury of right wrist, hand and finger(s), initial encounter: Secondary | ICD-10-CM | POA: Diagnosis present

## 2016-04-07 LAB — CBG MONITORING, ED
GLUCOSE-CAPILLARY: 141 mg/dL — AB (ref 65–99)
Glucose-Capillary: 81 mg/dL (ref 65–99)

## 2016-04-07 MED ORDER — DEXTROSE 50 % IV SOLN
INTRAVENOUS | Status: AC
  Filled 2016-04-07: qty 50

## 2016-04-07 MED ORDER — DEXTROSE 50 % IV SOLN
25.0000 mL | Freq: Once | INTRAVENOUS | Status: AC
  Administered 2016-04-07: 25 mL via INTRAVENOUS

## 2016-04-07 MED ORDER — FENTANYL CITRATE (PF) 100 MCG/2ML IJ SOLN
100.0000 ug | Freq: Once | INTRAMUSCULAR | Status: AC
  Administered 2016-04-07: 100 ug via INTRAVENOUS
  Filled 2016-04-07: qty 2

## 2016-04-07 MED ORDER — LIDOCAINE HCL 2 % IJ SOLN
20.0000 mL | Freq: Once | INTRAMUSCULAR | Status: AC
  Administered 2016-04-07: 400 mg via INTRADERMAL
  Filled 2016-04-07: qty 20

## 2016-04-07 MED ORDER — OXYCODONE-ACETAMINOPHEN 5-325 MG PO TABS: 0.5000 | ORAL_TABLET | Freq: Four times a day (QID) | ORAL | 0 refills | Status: DC | PRN

## 2016-04-07 NOTE — ED Notes (Signed)
Pt transported to and from Xray on stretcher with tech, tolerated well.

## 2016-04-07 NOTE — Progress Notes (Signed)
Orthopedic Tech Progress Note Patient Details:  Oralia RudJimmi Kaye Cudd 09/01/1940 [id number]  Ortho Devices Type of Ortho Device: Ace wrap, Arm sling, Sugartong splint Ortho Device/Splint Location: rue Ortho Device/Splint Interventions: Application   Keatin Benham 04/07/2016, 12:52 PM

## 2016-04-07 NOTE — ED Provider Notes (Signed)
[facility] Provider Note   CSN: [id number] Arrival date & time: 04/07/16  4098     History   Chief Complaint Chief Complaint  Patient presents with  . Arm Pain    HPI Natasha Hernandez is a 75 y.o. female.  Patient with history of insulin-dependent diabetes presents after a fall. Patient fell onto an outstretched right wrist. Deformity noted. Transported to the emergency department by family. She denies other injury. C/o wrist pain, worse with movement. Nothing makes the symptoms better. Patient took her insulin this morning but has not eaten. Onset of symptoms acute. Course is constant.      Past Medical History:  Diagnosis Date  . Carotid artery occlusion   . Coronary artery disease    CABG in 1996. Most recent cardiac catheterization in 2013 showed patent grafts including LIMA to LAD, SVG to OM, SVG to diagonal and SVG to PDA.  . Diabetes mellitus without complication (HCC)   . Hyperlipidemia   . Hypertension   . PAD (peripheral artery disease) (HCC)     There are no active problems to display for this patient.   Past Surgical History:  Procedure Laterality Date  . CORONARY ARTERY BYPASS GRAFT  1996    OB History    No data available       Home Medications    Prior to Admission medications   Medication Sig Start Date End Date Taking? Authorizing Provider  aspirin 325 MG tablet Take 325 mg by mouth daily.    Historical Provider, MD  Calcium Carbonate (CALCIUM 600 PO) Take 1 tablet by mouth daily.    Historical Provider, MD  carvedilol (COREG) 6.25 MG tablet Take 1 tablet (6.25 mg total) by mouth 2 (two) times daily with a meal. 01/26/16   Iran Ouch, MD  Coenzyme Q10 (CO Q 10 PO) Take 1 capsule by mouth daily.    Historical Provider, MD  glucose blood (ACCU-CHEK GUIDE) test strip Use to check blood sugar 5 times a day 04/02/16   Reather Littler, MD  insulin aspart (NOVOLOG) 100 UNIT/ML injection Inject 2-15 Units into the skin 3 (three) times  daily before meals. Based on sliding scale    Historical Provider, MD  insulin glargine (LANTUS) 100 UNIT/ML injection Inject 6 Units into the skin 2 (two) times daily.    Historical Provider, MD  isosorbide mononitrate (IMDUR) 60 MG 24 hr tablet Take 1.5 tablets (90 mg total) by mouth daily. 02/16/16   Iran Ouch, MD  lisinopril (PRINIVIL,ZESTRIL) 20 MG tablet Take 1 tablet (20 mg total) by mouth daily. 03/10/16   Corky Crafts, MD  Multiple Vitamin (MULTIVITAMIN) tablet Take 1 tablet by mouth daily.    Historical Provider, MD  nitroGLYCERIN (NITROLINGUAL) 0.4 MG/SPRAY spray Place 1 spray under the tongue every 5 (five) minutes x 3 doses as needed for chest pain (Call 911 if taking 3rd spray). 01/27/16   Iran Ouch, MD  simvastatin (ZOCOR) 40 MG tablet Take 40 mg by mouth daily.     Historical Provider, MD  VITAMIN E COMPLEX PO Take 1 capsule by mouth daily.    Historical Provider, MD    Family History No family history on file.  Social History Social History  Substance Use Topics  . Smoking status: Never Smoker  . Smokeless tobacco: Never Used  . Alcohol use Not on file     Allergies   Patient has no known allergies.   Review of Systems Review of Systems  Constitutional: Negative for activity change.  Musculoskeletal: Positive for arthralgias. Negative for back pain, joint swelling and neck pain.  Skin: Negative for wound.  Neurological: Negative for weakness and numbness.     Physical Exam Updated Vital Signs BP 140/62 (BP Location: Left Arm)   Pulse 64   Temp 97.7 F (36.5 C) (Oral)   Resp 16   Ht 5\' 4"  (1.626 m)   Wt 70.3 kg   SpO2 100%   BMI 26.61 kg/m   Physical Exam  Constitutional: She appears well-developed and well-nourished.  HENT:  Head: Normocephalic and atraumatic.  Eyes: Pupils are equal, round, and reactive to light.  Neck: Normal range of motion. Neck supple.  Cardiovascular: Normal pulses.  Exam reveals no decreased pulses.     Musculoskeletal: She exhibits tenderness. She exhibits no edema.       Right shoulder: Normal.       Right elbow: Normal.      Right wrist: She exhibits decreased range of motion, tenderness, bony tenderness and deformity.       Right hand: She exhibits normal range of motion and no tenderness. Normal sensation noted.       Hands: Neurological: She is alert. No sensory deficit.  Motor, sensation, and vascular distal to the injury is fully intact.   Skin: Skin is warm and dry.  Psychiatric: She has a normal mood and affect.  Nursing note and vitals reviewed.    ED Treatments / Results  Labs (all labs ordered are listed, but only abnormal results are displayed) Labs Reviewed  CBG MONITORING, ED - Abnormal; Notable for the following:       Result Value   Glucose-Capillary 141 (*)    All other components within normal limits  CBG MONITORING, ED    Radiology Dg Wrist Complete Right  Result Date: 04/07/2016 CLINICAL DATA:  75 year old female with wrist fracture post reduction. Subsequent encounter. EXAM: RIGHT WRIST - COMPLETE 3+ VIEW COMPARISON:  04/07/2016 FINDINGS: Casting right wrist obscures fine osseous and soft tissue detail. Comminuted fracture of the distal right radius with intra-articular extension and foreshortening. Distal radial articular surface is angulated dorsally. Right ulna styloid avulsion fracture. Vascular calcifications. IMPRESSION: Comminuted fracture of the distal right radius with intra-articular extension and foreshortening. Distal radial articular surface is angulated dorsally. Right ulna styloid avulsion fracture. Electronically Signed   By: Lacy DuverneySteven  Olson M.D.   On: 04/07/2016 12:41   Dg Wrist Complete Right  Result Date: 04/07/2016 CLINICAL DATA:  Pain following fall EXAM: RIGHT WRIST - COMPLETE 3+ VIEW COMPARISON:  None. FINDINGS: Frontal, oblique, lateral, and ulnar deviation scaphoid images were obtained. There is a comminuted fracture of the distal  radial metaphysis with dorsal displacement and angulation of the distal major fracture fragment with respect proximal major fragment. There is avulsion the ulnar styloid. No other fractures. No dislocations. There is extensive arterial vascular calcification. IMPRESSION: Comminuted fracture of the distal radial metaphysis with dorsal displacement angulation of the distal major fracture fragment. Avulsion ulnar styloid. No dislocation. No appreciable arthropathy. Extensive atherosclerotic calcification, primarily in the radial artery. Electronically Signed   By: Bretta BangWilliam  Woodruff III M.D.   On: 04/07/2016 10:18    Procedures .Nerve Block Date/Time: 04/07/2016 1:32 PM Performed by: Renne CriglerGEIPLE, Erdem Naas Authorized by: Renne CriglerGEIPLE, Jazari Ober   Consent:    Consent obtained:  Verbal   Consent given by:  Patient   Risks discussed:  Infection, swelling, nerve damage, pain and intravenous injection   Alternatives discussed:  No treatment Indications:  Indications:  Pain relief Location:    Body area:  Upper extremity   Laterality:  Right Pre-procedure details:    Skin preparation:  Povidone-iodine (alcohol ) Skin anesthesia (see MAR for exact dosages):    Skin anesthesia method:  Local infiltration   Local anesthetic:  Lidocaine 2% w/o epi Procedure details (see MAR for exact dosages):    Block needle gauge:  27 G   Anesthetic injected:  Lidocaine 2% w/o epi (8cc)   Injection procedure:  Anatomic landmarks identified, incremental injection, anatomic landmarks palpated and introduced needle Post-procedure details:    Outcome:  Pain relieved   Patient tolerance of procedure:  Tolerated well, no immediate complications Comments:     Hematoma block performed   (including critical care time)  Medications Ordered in ED Medications  fentaNYL (SUBLIMAZE) injection 100 mcg (100 mcg Intravenous Given 04/07/16 0929)  dextrose 50 % solution 25 mL (25 mLs Intravenous Given 04/07/16 0941)  lidocaine (XYLOCAINE)  2 % (with pres) injection 400 mg (400 mg Intradermal Given by Other 04/07/16 1213)  fentaNYL (SUBLIMAZE) injection 100 mcg (100 mcg Intravenous Given 04/07/16 1132)     Initial Impression / Assessment and Plan / ED Course  I have reviewed the triage vital signs and the nursing notes.  Pertinent labs & imaging results that were available during my care of the patient were reviewed by me and considered in my medical decision making (see chart for details).  Clinical Course    Patient seen and examined. Work-up initiated. Medications ordered.   Vital signs reviewed and are as follows: BP 140/62 (BP Location: Left Arm)   Pulse 64   Temp 97.7 F (36.5 C) (Oral)   Resp 16   Ht 5\' 4"  (1.626 m)   Wt 70.3 kg   SpO2 100%   BMI 26.61 kg/m   Spoke with PA for Dr. Mina MarbleWeingold. Reccs f/u in office tomorrow. States proceed with reduction if we feel it is necessary.   Hematoma block as above. Reduction attempt made, little improvement except in pain.    Patient splinted. Distal motor and circulation intact afterwards.  Discussed use of pain medicine, and lowest necessary dose, under supervision at home. Patient lives with her daughter.  Final Clinical Impressions(s) / ED Diagnoses   Final diagnoses:  Wrist fracture  Closed fracture of distal end of right radius, unspecified fracture morphology, initial encounter   Wrist fracture: Hematoma block as above with good pain relief. Attempt at reduction made, however post reduction films do not show much change. Bones were difficult to keep in alignment during reduction. Ortho f/u obtained. Upper ext with intact CMS at patient's baseline.   New Prescriptions New Prescriptions   OXYCODONE-ACETAMINOPHEN (PERCOCET/ROXICET) 5-325 MG TABLET    Take 0.5-1 tablets by mouth every 6 (six) hours as needed for severe pain.     Renne CriglerJoshua Debra Colon, PA-C 04/07/16 1338    Maia PlanJoshua G Long, MD 04/07/16 2118

## 2016-04-07 NOTE — ED Notes (Signed)
Checked patient blood sugar it was 81 notified RN of blood sugar 

## 2016-04-07 NOTE — Discharge Instructions (Signed)
Please read and follow all provided instructions.  Your diagnoses today include:  1. Closed fracture of distal end of right radius, unspecified fracture morphology, initial encounter   2. Wrist fracture     Tests performed today include:  An x-ray of the affected area - shows broken wrist  Vital signs. See below for your results today.   Medications prescribed:   Percocet (oxycodone/acetaminophen) - narcotic pain medication  DO NOT drive or perform any activities that require you to be awake and alert because this medicine can make you drowsy. BE VERY CAREFUL not to take multiple medicines containing Tylenol (also called acetaminophen). Doing so can lead to an overdose which can damage your liver and cause liver failure and possibly death.  Use pain medication only under direct supervision at the lowest possible dose needed to control your pain.   Take any prescribed medications only as directed.  Home care instructions:   Follow any educational materials contained in this packet  Follow R.I.C.E. Protocol:  R - rest your injury   I  - use ice on injury without applying directly to skin  C - compress injury with bandage or splint  E - elevate the injury as much as possible  Follow-up instructions: Please follow-up with Dr. Mina MarbleWeingold tomorrow.   Return instructions:   Please return if your fingers are numb or tingling, appear gray or blue, or you have severe pain (also elevate the arm and loosen splint or wrap if you were given one)  Please return to the Emergency Department if you experience worsening symptoms.   Please return if you have any other emergent concerns.  Additional Information:  Your vital signs today were: BP 135/64    Pulse 77    Temp 97.7 F (36.5 C) (Oral)    Resp 18    Ht 5\' 4"  (1.626 m)    Wt 70.3 kg    SpO2 100%    BMI 26.61 kg/m  If your blood pressure (BP) was elevated above 135/85 this visit, please have this repeated by your doctor within  one month. --------------

## 2016-04-07 NOTE — ED Triage Notes (Signed)
Pt reports this morning was reaching for dogs and fell on her right wrist. Has wrist deformity to right side and reports numbness.

## 2016-04-07 NOTE — ED Notes (Signed)
Got patient ready for discharge 

## 2016-04-08 ENCOUNTER — Other Ambulatory Visit: Payer: Self-pay

## 2016-04-08 DIAGNOSIS — S52571A Other intraarticular fracture of lower end of right radius, initial encounter for closed fracture: Secondary | ICD-10-CM | POA: Diagnosis not present

## 2016-04-08 DIAGNOSIS — S52501A Unspecified fracture of the lower end of right radius, initial encounter for closed fracture: Secondary | ICD-10-CM | POA: Insufficient documentation

## 2016-04-08 MED ORDER — GLUCOSE BLOOD VI STRP: ORAL_STRIP | 12 refills | Status: DC

## 2016-04-09 DIAGNOSIS — S52571A Other intraarticular fracture of lower end of right radius, initial encounter for closed fracture: Secondary | ICD-10-CM | POA: Diagnosis not present

## 2016-04-09 DIAGNOSIS — G5601 Carpal tunnel syndrome, right upper limb: Secondary | ICD-10-CM | POA: Diagnosis not present

## 2016-04-09 DIAGNOSIS — G8918 Other acute postprocedural pain: Secondary | ICD-10-CM | POA: Diagnosis not present

## 2016-04-09 DIAGNOSIS — Y999 Unspecified external cause status: Secondary | ICD-10-CM | POA: Diagnosis not present

## 2016-04-13 ENCOUNTER — Encounter: Payer: Medicare Other | Admitting: Nutrition

## 2016-04-13 DIAGNOSIS — M79644 Pain in right finger(s): Secondary | ICD-10-CM | POA: Insufficient documentation

## 2016-04-13 DIAGNOSIS — M25631 Stiffness of right wrist, not elsewhere classified: Secondary | ICD-10-CM | POA: Diagnosis not present

## 2016-04-13 DIAGNOSIS — M25641 Stiffness of right hand, not elsewhere classified: Secondary | ICD-10-CM | POA: Diagnosis not present

## 2016-04-13 DIAGNOSIS — S52571A Other intraarticular fracture of lower end of right radius, initial encounter for closed fracture: Secondary | ICD-10-CM | POA: Diagnosis not present

## 2016-04-13 HISTORY — DX: Pain in right finger(s): M79.644

## 2016-04-14 ENCOUNTER — Encounter: Payer: Medicare Other | Admitting: Nutrition

## 2016-04-18 ENCOUNTER — Encounter (HOSPITAL_COMMUNITY): Payer: Self-pay

## 2016-04-18 ENCOUNTER — Ambulatory Visit (HOSPITAL_COMMUNITY)
Admission: EM | Admit: 2016-04-18 | Discharge: 2016-04-18 | Disposition: A | Discharge status: Home / Self Care | Payer: Medicare Other | Attending: Emergency Medicine | Admitting: Emergency Medicine

## 2016-04-18 ENCOUNTER — Ambulatory Visit (INDEPENDENT_AMBULATORY_CARE_PROVIDER_SITE_OTHER): Payer: Medicare Other

## 2016-04-18 DIAGNOSIS — S39012A Strain of muscle, fascia and tendon of lower back, initial encounter: Secondary | ICD-10-CM | POA: Diagnosis not present

## 2016-04-18 DIAGNOSIS — S79911A Unspecified injury of right hip, initial encounter: Secondary | ICD-10-CM | POA: Diagnosis not present

## 2016-04-18 DIAGNOSIS — W06XXXA Fall from bed, initial encounter: Secondary | ICD-10-CM

## 2016-04-18 DIAGNOSIS — M25551 Pain in right hip: Secondary | ICD-10-CM | POA: Diagnosis not present

## 2016-04-18 MED ORDER — TRAMADOL HCL 50 MG PO TABS: 50.0000 mg | ORAL_TABLET | Freq: Four times a day (QID) | ORAL | 0 refills | Status: DC | PRN

## 2016-04-18 NOTE — ED Provider Notes (Signed)
[facility]    CSN: [id number] Arrival date & time: 04/18/16  1224     History   Chief Complaint Chief Complaint  Patient presents with  . Fall    HPI Natasha Hernandez is a 75 y.o. female.   HPI  She is a 75 year old woman here with her son and daughter-in-law for evaluation after a fall. She states she fell out of bed this morning. She is not sure how that happened or exactly how she landed. She states she was sitting parallel to the bed. She scratched herself to the bathroom to get to the phone and that is where her son found her. He was able to get her standing and she was able to walk. She feels okay sitting up, but trying to lay flat causes severe pain across the right lower back. She denies any groin pain. She reports mild bilateral hip pain. She has not taken any medications.  Past Medical History:  Diagnosis Date  . Carotid artery occlusion   . Coronary artery disease    CABG in 1996. Most recent cardiac catheterization in 2013 showed patent grafts including LIMA to LAD, SVG to OM, SVG to diagonal and SVG to PDA.  . Diabetes mellitus without complication (HCC)   . Hyperlipidemia   . Hypertension   . PAD (peripheral artery disease) (HCC)     There are no active problems to display for this patient.   Past Surgical History:  Procedure Laterality Date  . CORONARY ARTERY BYPASS GRAFT  1996    OB History    No data available       Home Medications    Prior to Admission medications   Medication Sig Start Date End Date Taking? Authorizing Provider  aspirin 325 MG tablet Take 325 mg by mouth every Monday, Wednesday, and Friday.    Yes Historical Provider, MD  Calcium Carbonate (CALCIUM 600 PO) Take 600-1,200 tablets by mouth 2 (two) times daily. 1 tablet in the morning , and 1 tablet in the evenung   Yes Historical Provider, MD  carvedilol (COREG) 6.25 MG tablet Take 1 tablet (6.25 mg total) by mouth 2 (two) times daily with a meal. 01/26/16  Yes  Iran OuchMuhammad A Arida, MD  Coenzyme Q10 (CO Q 10 PO) Take 1 capsule by mouth daily.   Yes Historical Provider, MD  glucose blood (ACCU-CHEK GUIDE) test strip Use to check blood sugar 5 times per day. Dx code E10.9 04/08/16  Yes Reather LittlerAjay Kumar, MD  insulin aspart (NOVOLOG) 100 UNIT/ML injection Inject 2-15 Units into the skin 3 (three) times daily before meals. Sliding scale: 0-150 equals 2 units, increase 1 units for every 50 blood sugar reading after 150. Formula for carbs is number of carbs divided by 5.   Yes Historical Provider, MD  insulin glargine (LANTUS) 100 UNIT/ML injection Inject 5 Units into the skin 2 (two) times daily.    Yes Historical Provider, MD  isosorbide mononitrate (IMDUR) 60 MG 24 hr tablet Take 1.5 tablets (90 mg total) by mouth daily. Patient taking differently: Take 30-60 mg by mouth 2 (two) times daily. Take 1/2 tablet in the morning and 1 tablet at night 02/16/16  Yes Iran OuchMuhammad A Arida, MD  lisinopril (PRINIVIL,ZESTRIL) 20 MG tablet Take 1 tablet (20 mg total) by mouth daily. 03/10/16  Yes Corky CraftsJayadeep S Varanasi, MD  Multiple Vitamin (MULTIVITAMIN) tablet Take 1 tablet by mouth daily.   Yes Historical Provider, MD  simvastatin (ZOCOR) 40 MG tablet Take 40 mg  by mouth at bedtime.    Yes Historical Provider, MD  nitroGLYCERIN (NITROLINGUAL) 0.4 MG/SPRAY spray Place 1 spray under the tongue every 5 (five) minutes x 3 doses as needed for chest pain (Call 911 if taking 3rd spray). 01/27/16   Iran Ouch, MD  oxyCODONE-acetaminophen (PERCOCET/ROXICET) 5-325 MG tablet Take 0.5-1 tablets by mouth every 6 (six) hours as needed for severe pain. 04/07/16   Renne Crigler, PA-C  traMADol (ULTRAM) 50 MG tablet Take 1 tablet (50 mg total) by mouth every 6 (six) hours as needed. 04/18/16   Charm Rings, MD    Family History No family history on file.  Social History Social History  Substance Use Topics  . Smoking status: Never Smoker  . Smokeless tobacco: Never Used  . Alcohol use No      Allergies   Patient has no known allergies.   Review of Systems Review of Systems As in history of present illness  Physical Exam Triage Vital Signs ED Triage Vitals  Enc Vitals Group     BP 04/18/16 1439 162/79     Pulse Rate 04/18/16 1439 83     Resp 04/18/16 1439 16     Temp 04/18/16 1439 98.1 F (36.7 C)     Temp Source 04/18/16 1439 Oral     SpO2 04/18/16 1439 98 %     Weight --      Height --      Head Circumference --      Peak Flow --      Pain Score 04/18/16 1446 0     Pain Loc --      Pain Edu? --      Excl. in GC? --    No data found.   Updated Vital Signs BP 162/79 (BP Location: Left Arm)   Pulse 83   Temp 98.1 F (36.7 C) (Oral)   Resp 16   SpO2 98%   Visual Acuity Right Eye Distance:   Left Eye Distance:   Bilateral Distance:    Right Eye Near:   Left Eye Near:    Bilateral Near:     Physical Exam  Constitutional: She is oriented to person, place, and time. She appears well-developed and well-nourished. No distress.  Cardiovascular: Normal rate.   Pulmonary/Chest: Effort normal.  Musculoskeletal:  Back: No erythema or edema. No vertebral tenderness or step-offs. She has mild spasm in the right lower back. No tenderness to palpation. Lying flat reproduces her pain. Hips: No obvious deformity. No pain with passive internal or external rotation bilaterally. 5 out of 5 strength in hip flexion, but does cause pain on the right.  Neurological: She is alert and oriented to person, place, and time.     UC Treatments / Results  Labs (all labs ordered are listed, but only abnormal results are displayed) Labs Reviewed - No data to display  EKG  EKG Interpretation None       Radiology Dg Hip Unilat With Pelvis 2-3 Views Right  Result Date: 04/18/2016 CLINICAL DATA:  Fall, right side pain. EXAM: DG HIP (WITH OR WITHOUT PELVIS) 2-3V RIGHT COMPARISON:  None. FINDINGS: Mild symmetric degenerative changes in the hips bilaterally. No  acute bony abnormality. Specifically, no fracture, subluxation, or dislocation. Soft tissues are intact. IMPRESSION: No acute bony abnormality. Electronically Signed   By: Charlett Nose M.D.   On: 04/18/2016 15:21    Procedures Procedures (including critical care time)  Medications Ordered in UC Medications - No data to  display   Initial Impression / Assessment and Plan / UC Course  I have reviewed the triage vital signs and the nursing notes.  Pertinent labs & imaging results that were available during my care of the patient were reviewed by me and considered in my medical decision making (see chart for details).  Clinical Course     X-rays negative for fracture. Symptomatic treatment with ice/heat and Tylenol. Relative rest for the next several days then gentle stretching. Follow-up with PCP if not improving in 2-4 weeks.  Final Clinical Impressions(s) / UC Diagnoses   Final diagnoses:  Fall from bed, initial encounter  Back strain, initial encounter    New Prescriptions New Prescriptions   TRAMADOL (ULTRAM) 50 MG TABLET    Take 1 tablet (50 mg total) by mouth every 6 (six) hours as needed.     Charm RingsErin J Assunta Pupo, MD 04/18/16 [phone]

## 2016-04-18 NOTE — Discharge Instructions (Signed)
Your x-ray shows some arthritis, but no broken bone. You have likely pulled a muscle in your back. Apply ice for 20 minutes at least 3 times a day. You can apply heat as much as you want to. Take Tylenol 1000mg  every 8 hours as needed for pain. Use the tramadol every 6-8 hours as needed for severe pain. This will gradually improve over the next several weeks. Follow-up with your PCP if not improving after 2-4 weeks.

## 2016-04-18 NOTE — ED Triage Notes (Signed)
Pt said she fell out of bed Last night and was scooting on the floor and now her right leg is hurting and her right hip/side. No bruises or scratches. Took ibuprofen without relief.

## 2016-04-28 ENCOUNTER — Encounter: Payer: Medicare Other | Attending: Endocrinology | Admitting: Nutrition

## 2016-04-28 DIAGNOSIS — Z713 Dietary counseling and surveillance: Secondary | ICD-10-CM | POA: Diagnosis not present

## 2016-04-28 DIAGNOSIS — E1065 Type 1 diabetes mellitus with hyperglycemia: Secondary | ICD-10-CM | POA: Diagnosis not present

## 2016-04-29 ENCOUNTER — Telehealth: Payer: Self-pay | Admitting: Nutrition

## 2016-04-29 NOTE — Progress Notes (Signed)
Patient and daughter are here for blood sugar review.  Pt. Is very concerned about variablity of readings--"lows and high every day".  Meter download for last 2 weeks show 2 reading in the early AM of 50s, and 60s, and occasional ones throughout the day.  No pattern for theses-3X acS, once at HS-over 14 . Insulin dose:  Lantus 5u q 12 hours: 8AM/8PM, and Novolog: 1u/5 grams plus a correction per sliding scale. Her meals are balanced, and she appears to be counting carbs correctly.  She is taking 1u/5 grams of carb, and taking the Novolog 5-10 min. Before every meal.  She does a correction dose per "slliding scale that her old doctor gave her.  Her FBS today was 268, and she took 3u (2u for meal coverage,and 1 for correction).   We reviewed the new way to do a correction of subtracting 140 from current reading, and dividing by 50.  Written instructions were given for this.  She reported good understanding of this. Per Dr. Remus BlakeKumar's voice order, we changed her I/C ratio at breakfast o 1u/4 grams due to high readings acL (200s).  She is eating 10-20 grams acB.   She is also injecting in rotating sites of arms, abdomen and legs alternately with every injection.  Discussed the importance of staying in one are for the lantus injections especially.  She agreed to do this. She was shown the [place], because she is "very tired of testing blood sugars.  Note sent to D.r Kumar's cma to order Free style Libre testing supplies Also discussed Dexcom and alerts for lows, and that it is covered by Medicare.  They were given a brochure to review.   We also discussed the insulin pump--how it works, and what is involved with using one.  I am not certain that she can use a Medtronic pump, but possibly the Tandem, if she is going to use the Dexcom sensor.  She wants the OmniPod, but it is not covered by medicare.   Discussed also the newer long acting insulins that are more stable and less variable in their timing.  They had no  final questions.

## 2016-04-29 NOTE — Telephone Encounter (Signed)
Breakfast carbohydrate ratio to be 1:4, please call

## 2016-04-29 NOTE — Telephone Encounter (Signed)
Blood sugar download shows high readings every day before lunch.  Pt. Is doing a 1u/5 gram carbs of Novolog

## 2016-04-29 NOTE — Patient Instructions (Addendum)
Give a 1u for 4 grams of carbs at breakfast. Stay in one area of the body for injections of Lantus--rotating the sites in that area.

## 2016-04-30 ENCOUNTER — Other Ambulatory Visit: Payer: Self-pay

## 2016-04-30 MED ORDER — FREESTYLE LIBRE SENSOR SYSTEM MISC: 3.0000 | 2 refills | Status: DC

## 2016-04-30 MED ORDER — FREESTYLE LIBRE READER DEVI: 1.0000 | Freq: Once | 2 refills | Status: AC

## 2016-04-30 NOTE — Telephone Encounter (Signed)
Patient has been made aware.

## 2016-04-30 NOTE — Telephone Encounter (Signed)
Ordered the [place] system for the patient

## 2016-05-03 ENCOUNTER — Other Ambulatory Visit: Payer: Self-pay

## 2016-05-04 ENCOUNTER — Other Ambulatory Visit (INDEPENDENT_AMBULATORY_CARE_PROVIDER_SITE_OTHER): Payer: Medicare Other

## 2016-05-04 DIAGNOSIS — E7800 Pure hypercholesterolemia, unspecified: Secondary | ICD-10-CM

## 2016-05-04 DIAGNOSIS — E1065 Type 1 diabetes mellitus with hyperglycemia: Secondary | ICD-10-CM | POA: Diagnosis not present

## 2016-05-04 DIAGNOSIS — R5383 Other fatigue: Secondary | ICD-10-CM

## 2016-05-04 LAB — URINALYSIS, ROUTINE W REFLEX MICROSCOPIC
BILIRUBIN URINE: NEGATIVE
HGB URINE DIPSTICK: NEGATIVE
Leukocytes, UA: NEGATIVE
NITRITE: NEGATIVE
PH: 6 (ref 5.0–8.0)
RBC / HPF: NONE SEEN (ref 0–?)
Specific Gravity, Urine: 1.02 (ref 1.000–1.030)
TOTAL PROTEIN, URINE-UPE24: NEGATIVE
URINE GLUCOSE: 100 — AB
UROBILINOGEN UA: 0.2 (ref 0.0–1.0)
WBC, UA: NONE SEEN (ref 0–?)

## 2016-05-04 LAB — TSH: TSH: 2.53 u[IU]/mL (ref 0.35–4.50)

## 2016-05-04 LAB — COMPREHENSIVE METABOLIC PANEL
ALBUMIN: 4.1 g/dL (ref 3.5–5.2)
ALK PHOS: 92 U/L (ref 39–117)
ALT: 19 U/L (ref 0–35)
AST: 18 U/L (ref 0–37)
BILIRUBIN TOTAL: 0.4 mg/dL (ref 0.2–1.2)
BUN: 32 mg/dL — ABNORMAL HIGH (ref 6–23)
CALCIUM: 9.7 mg/dL (ref 8.4–10.5)
CO2: 31 meq/L (ref 19–32)
CREATININE: 1.04 mg/dL (ref 0.40–1.20)
Chloride: 101 mEq/L (ref 96–112)
GFR: 55.04 mL/min — AB (ref >60.00)
Glucose, Bld: 228 mg/dL — ABNORMAL HIGH (ref 70–99)
Potassium: 4.7 mEq/L (ref 3.5–5.1)
Sodium: 138 mEq/L (ref 135–145)
Total Protein: 6.6 g/dL (ref 6.0–8.3)

## 2016-05-04 LAB — LIPID PANEL
CHOL/HDL RATIO: 2
CHOLESTEROL: 216 mg/dL — AB (ref 0–200)
HDL: 101.6 mg/dL (ref >39.00)
LDL Cholesterol: 100 mg/dL — ABNORMAL HIGH (ref 0–99)
NonHDL: 114.78
TRIGLYCERIDES: 73 mg/dL (ref 0.0–149.0)
VLDL: 14.6 mg/dL (ref 0.0–40.0)

## 2016-05-04 LAB — MICROALBUMIN / CREATININE URINE RATIO
CREATININE, U: 83.6 mg/dL
MICROALB/CREAT RATIO: 0.8 mg/g (ref 0.0–30.0)
Microalb, Ur: <0.7 mg/dL (ref 0.0–1.9)

## 2016-05-07 ENCOUNTER — Telehealth: Payer: Self-pay | Admitting: Endocrinology

## 2016-05-07 ENCOUNTER — Encounter: Payer: Self-pay | Admitting: Endocrinology

## 2016-05-07 ENCOUNTER — Ambulatory Visit (INDEPENDENT_AMBULATORY_CARE_PROVIDER_SITE_OTHER): Payer: Medicare Other | Admitting: Endocrinology

## 2016-05-07 VITALS — BP 118/56 | HR 86 | Ht 64.0 in | Wt 152.0 lb

## 2016-05-07 DIAGNOSIS — Z23 Encounter for immunization: Secondary | ICD-10-CM | POA: Diagnosis not present

## 2016-05-07 DIAGNOSIS — E7800 Pure hypercholesterolemia, unspecified: Secondary | ICD-10-CM | POA: Insufficient documentation

## 2016-05-07 DIAGNOSIS — E1042 Type 1 diabetes mellitus with diabetic polyneuropathy: Secondary | ICD-10-CM | POA: Diagnosis not present

## 2016-05-07 DIAGNOSIS — E1069 Type 1 diabetes mellitus with other specified complication: Secondary | ICD-10-CM | POA: Insufficient documentation

## 2016-05-07 DIAGNOSIS — E1065 Type 1 diabetes mellitus with hyperglycemia: Secondary | ICD-10-CM | POA: Diagnosis not present

## 2016-05-07 MED ORDER — ROSUVASTATIN CALCIUM 20 MG PO TABS
20.0000 mg | ORAL_TABLET | Freq: Every day | ORAL | 3 refills | Status: DC
Start: 2016-05-07 — End: 2016-06-25

## 2016-05-07 NOTE — Telephone Encounter (Signed)
Need dx code for Continuous Blood Gluc Sensor (FREESTYLE LIBRE SENSOR SYSTEM) MISC  Insurance will pay foe tresebia

## 2016-05-07 NOTE — Addendum Note (Signed)
Addended by: Bobbye RiggsWALKER, Debrah Granderson L on: 05/07/2016 04:05 PM   Modules accepted: Orders

## 2016-05-07 NOTE — Patient Instructions (Addendum)
Lantus 6 units and 5 in am  More sugars after Bfst  Check on [employer] and [employer]

## 2016-05-07 NOTE — Progress Notes (Signed)
Patient ID: Natasha Hernandez, female   DOB: Oct 22, 1940, 76 y.o.   MRN: [id number]            Reason for Appointment :  for Type 1 Diabetes  History of Present Illness          Diagnosis: Type 1 diabetes mellitus, date of diagnosis: 1953          Previous history:  She thinks she has been on various insulin regimens over the years but has been on Lantus for 10-12 years along with mealtime insulin Previously followed by an endocrinologist but no records available. Her A1c has usually been around 7% reportedly and was 7.2 in 7/16   Recent history:     INSULIN regimen is: NOVOLOG carbohydrate coverage 1:4 in am then 1:5 and 1: 50 correction factor at meals Lantus 5 units at breakfast and bedtime  Current management, blood sugar patterns and problems identified:    Because of tendency to hypoglycemia her Lantus was reduced by 1 unit twice a day when she was told to take it 12 hours apart  She has had less low blood sugars and none since about 04/23/16  Fasting readings are mostly higher than before and recently usually over 150, however lowest blood sugar was 48 about 2 weeks ago  She is trying to use carbohydrate counting to cover her meals and since readings were higher at lunchtime she was told to take 1:4 carbohydrate coverage in the morning; however she is usually eating a relatively low carbohydrate meal in the morning with low carbohydrate toast or low-carb cereal  Although she thinks she is doing a correction of 1:50 she will take 3 units if blood sugar is 200  She does not always recognize low blood sugar symptoms and sometimes may get confused with low sugars  She has been so far unable to get the freestyle Libre sensor and is not eligible for the 670 pump as yet  Usually trying to take NovoLog right before eating        Glucose monitoring:  is being done 3-5 times a day         Glucometer: Accu-Chek guide  Blood Glucose readings as below  Mean values apply above for  all meters except median for One Touch  PRE-MEAL Fasting Lunch Dinner Bedtime Overall  Glucose range: 48-277 154-260  54-298  64-215   Mean/median: 157  185  154  161  164     Hypoglycemia: As above Factors causing hyperglycemia: Decrease intake and increased insulin coverage, increased physical activity, more in the afternoons  Symptoms of hypoglycemia: Some weakness, confusion at times Treatment of hypoglycemia: Corn syrup or jellybeans/other sweets.  She will get 15 g of carbohydrate  Self-care: The diet that the patient has been following is: Carbohydrate counting  Mealtimes are: Breakfast AT 8.30 AM Dinner: 7.30   For breakfast will have toast with cheese or egg, sometimes cereal and egg Periodically eating out         Exercise: less recently otherwise some walking and exercise bike         Dietician consultation: Most recent: Years ago .         CDE consultation: 04/28/16     Lab Results  Component Value Date   HGBA1C 6.6 04/02/2016   Lab Results  Component Value Date   MICROALBUR <0.7 05/04/2016   LDLCALC 100 (H) 05/04/2016   CREATININE 1.04 05/04/2016    Lab Results  Component Value Date  MICRALBCREAT 0.8 05/04/2016     Allergies as of 05/07/2016   No Known Allergies     Medication List       Accurate as of 05/07/16 12:08 PM. Always use your most recent med list.          aspirin 325 MG tablet Take 325 mg by mouth every Monday, Wednesday, and Friday.   CALCIUM 600 PO Take 600-1,200 tablets by mouth 2 (two) times daily. 1 tablet in the morning , and 1 tablet in the evenung   carvedilol 6.25 MG tablet Commonly known as:  COREG Take 1 tablet (6.25 mg total) by mouth 2 (two) times daily with a meal.   CO Q 10 PO Take 1 capsule by mouth daily.   FREESTYLE LIBRE SENSOR SYSTEM Misc 3 each by Does not apply route every 14 (fourteen) days.   glucose blood test strip Commonly known as:  ACCU-CHEK GUIDE Use to check blood sugar 5 times per day. Dx  code E10.9   insulin aspart 100 UNIT/ML injection Commonly known as:  novoLOG Inject 2-15 Units into the skin 3 (three) times daily before meals. Sliding scale: 0-150 equals 2 units, increase 1 units for every 50 blood sugar reading after 150. Formula for carbs is number of carbs divided by 5.   insulin glargine 100 UNIT/ML injection Commonly known as:  LANTUS Inject 5 Units into the skin 2 (two) times daily.   isosorbide mononitrate 60 MG 24 hr tablet Commonly known as:  IMDUR Take 1.5 tablets (90 mg total) by mouth daily.   lisinopril 20 MG tablet Commonly known as:  PRINIVIL,ZESTRIL Take 1 tablet (20 mg total) by mouth daily.   multivitamin tablet Take 1 tablet by mouth daily.   nitroGLYCERIN 0.4 MG/SPRAY spray Commonly known as:  NITROLINGUAL Place 1 spray under the tongue every 5 (five) minutes x 3 doses as needed for chest pain (Call 911 if taking 3rd spray).   oxyCODONE-acetaminophen 5-325 MG tablet Commonly known as:  PERCOCET/ROXICET Take 0.5-1 tablets by mouth every 6 (six) hours as needed for severe pain.   simvastatin 40 MG tablet Commonly known as:  ZOCOR Take 40 mg by mouth at bedtime.   traMADol 50 MG tablet Commonly known as:  ULTRAM Take 1 tablet (50 mg total) by mouth every 6 (six) hours as needed.       Allergies: No Known Allergies  Past Medical History:  Diagnosis Date  . Carotid artery occlusion   . Coronary artery disease    CABG in 1996. Most recent cardiac catheterization in 2013 showed patent grafts including LIMA to LAD, SVG to OM, SVG to diagonal and SVG to PDA.  . Diabetes mellitus without complication (HCC)   . Hyperlipidemia   . Hypertension   . PAD (peripheral artery disease) (HCC)     Past Surgical History:  Procedure Laterality Date  . CORONARY ARTERY BYPASS GRAFT  1996    No family history on file.  Social History:  reports that she has never smoked. She has never used smokeless tobacco. She reports that she does not drink  alcohol. Her drug history is not on file.      Review of Systems   Has had fatigue but TSH is normal      Lipids:   Her simvastatin was doubled up to 40 mg daily but her LDL is still 100 Has history of CAD in the past  Lab Results  Component Value Date   CHOL 216 (H) 05/04/2016  HDL 101.60 05/04/2016   LDLCALC 100 (H) 05/04/2016   TRIG 73.0 05/04/2016   CHOLHDL 2 05/04/2016    Has had proliferative retinopathy, report of last exam not available  LABS:  Lab on 05/04/2016  Component Date Value Ref Range Status  . TSH 05/04/2016 2.53  0.35 - 4.50 uIU/mL Final  . Sodium 05/04/2016 138  135 - 145 mEq/L Final  . Potassium 05/04/2016 4.7  3.5 - 5.1 mEq/L Final  . Chloride 05/04/2016 101  96 - 112 mEq/L Final  . CO2 05/04/2016 31  19 - 32 mEq/L Final  . Glucose, Bld 05/04/2016 228* 70 - 99 mg/dL Final  . BUN 91/47/829501/16/2018 32* 6 - 23 mg/dL Final  . Creatinine, Ser 05/04/2016 1.04  0.40 - 1.20 mg/dL Final  . Total Bilirubin 05/04/2016 0.4  0.2 - 1.2 mg/dL Final  . Alkaline Phosphatase 05/04/2016 92  39 - 117 U/L Final  . AST 05/04/2016 18  0 - 37 U/L Final  . ALT 05/04/2016 19  0 - 35 U/L Final  . Total Protein 05/04/2016 6.6  6.0 - 8.3 g/dL Final  . Albumin 62/13/086501/16/2018 4.1  3.5 - 5.2 g/dL Final  . Calcium 78/46/962901/16/2018 9.7  8.4 - 10.5 mg/dL Final  . GFR 52/84/132401/16/2018 55.04* >60.00 mL/min Final  . Cholesterol 05/04/2016 216* 0 - 200 mg/dL Final  . Triglycerides 05/04/2016 73.0  0.0 - 149.0 mg/dL Final  . HDL 40/10/272501/16/2018 101.60  >39.00 mg/dL Final  . VLDL 36/64/403401/16/2018 14.6  0.0 - 40.0 mg/dL Final  . LDL Cholesterol 05/04/2016 100* 0 - 99 mg/dL Final  . Total CHOL/HDL Ratio 05/04/2016 2   Final  . NonHDL 05/04/2016 114.78   Final  . Microalb, Ur 05/04/2016 <0.7  0.0 - 1.9 mg/dL Final  . Creatinine,U 74/25/956301/16/2018 83.6  mg/dL Final  . Microalb Creat Ratio 05/04/2016 0.8  0.0 - 30.0 mg/g Final  . Color, Urine 05/04/2016 YELLOW  Yellow;Lt. Yellow Final  . APPearance 05/04/2016 CLEAR  Clear  Final  . Specific Gravity, Urine 05/04/2016 1.020  1.000 - 1.030 Final  . pH 05/04/2016 6.0  5.0 - 8.0 Final  . Total Protein, Urine 05/04/2016 NEGATIVE  Negative Final  . Urine Glucose 05/04/2016 100* Negative Final  . Ketones, ur 05/04/2016 TRACE* Negative Final  . Bilirubin Urine 05/04/2016 NEGATIVE  Negative Final  . Hgb urine dipstick 05/04/2016 NEGATIVE  Negative Final  . Urobilinogen, UA 05/04/2016 0.2  0.0 - 1.0 Final  . Leukocytes, UA 05/04/2016 NEGATIVE  Negative Final  . Nitrite 05/04/2016 NEGATIVE  Negative Final  . WBC, UA 05/04/2016 none seen  0-2/hpf Final  . RBC / HPF 05/04/2016 none seen  0-2/hpf Final  . Squamous Epithelial / LPF 05/04/2016 Rare(0-4/hpf)  Rare(0-4/hpf) Final    Physical Examination:  BP (!) 118/56   Pulse 86   Ht 5\' 4"  (1.626 m)   Wt 152 lb (68.9 kg)   SpO2 93%   BMI 26.09 kg/m      ASSESSMENT:  Diabetes type 1, long-standing with overall fairly good control and A1c usually near 7% A1c is Recently 6.6 She has "control but overall blood sugars are fluctuating As discussed above she has tendency to high readings before lunchtime but lower by suppertime periodically This is dependent on her activity level and type of diet Appears to be fairly comfortable doing carbohydrate counting for mealtime insulin but still has questions today  Not clear if her blood sugars are higher 2 hours after breakfast, does not appear to  be  But not need to increase her Lantus in the morning because of altered little lower readings by suppertime at times from increased activity   LIPIDS:  not adequately controlled with LDL 100, has history of CAD Explained need for high intensity statin or combination therapy and will not benefit from simvastatin currently, discussed need for change  Discussed need for vaccination against pneumococcal disease, she currently has not had any on record  She refuses influenza vaccine   PLAN:    Increase Lantus at night to 6  units.  Check on coverage of Tresiba or Toujeo, discussed that these can be used usually once a day and would use 11 units of Tresiba to start with and adjust dose based on fasting readings are  She does need to check more readings 2 hours after breakfast as these are not being checked to help adjust her morning insulin coverage  Will not increase her morning Lantus as yet since she has relatively lower readings before suppertime at times and need to avoid any potential hypoglycemia  Given her more information on carbohydrate counting as she is having more questions again today  Change simvastatin to Crestor 20 mg daily  Prevnar given   Patient Instructions  Lantus 6 units and 5 in am  More sugars after Bfst  Check on Tresiba and Toujeo    Counseling time on subjects discussed above is over 50% of today's 25 minute visit    Natasha Hernandez 05/07/2016, 12:08 PM   Note: This note was prepared with Dragon voice recognition system technology. Any transcriptional errors that result from this process are unintentional.

## 2016-05-11 ENCOUNTER — Other Ambulatory Visit: Payer: Self-pay

## 2016-05-11 MED ORDER — INSULIN DEGLUDEC 100 UNIT/ML ~~LOC~~ SOPN: PEN_INJECTOR | SUBCUTANEOUS | 3 refills | Status: DC

## 2016-05-11 MED ORDER — FREESTYLE LIBRE SENSOR SYSTEM MISC: 3.0000 | 2 refills | Status: DC

## 2016-05-11 NOTE — Telephone Encounter (Signed)
Completed 05/11/16

## 2016-05-13 DIAGNOSIS — S52571A Other intraarticular fracture of lower end of right radius, initial encounter for closed fracture: Secondary | ICD-10-CM | POA: Diagnosis not present

## 2016-05-14 ENCOUNTER — Telehealth: Payer: Self-pay | Admitting: General Practice

## 2016-05-14 NOTE — Telephone Encounter (Signed)
They received her new insulin degludec (TRESIBA FLEXTOUCH) 100 UNIT/ML SOPN FlexTouch Pen and was wanting to know is there anything special that she needs to know about the insulin degludec (TRESIBA FLEXTOUCH) 100 UNIT/ML SOPN FlexTouch Pen. She was also wondering about theContinuous Blood Gluc Sensor (FREESTYLE LIBRE SENSOR SYSTEM) MISC, she went to the pharmacy and they said that they haven't received anything from our office. Natasha Hernandez would like for you to call her.

## 2016-05-18 NOTE — Telephone Encounter (Signed)
She will change Lantus to 11 units of Tresiba to start with, increase the dose by 1 unit if morning sugars stay is over 143 Please call in prescription for the freestyle [place] again

## 2016-05-20 ENCOUNTER — Other Ambulatory Visit: Payer: Self-pay

## 2016-05-20 MED ORDER — FREESTYLE LIBRE SENSOR SYSTEM MISC: 3.0000 | 3 refills | Status: DC

## 2016-05-20 MED ORDER — FREESTYLE LIBRE READER DEVI: 1.0000 | Freq: Once | 1 refills | Status: AC

## 2016-05-20 NOTE — Telephone Encounter (Signed)
Please print that out and will modify & sign it

## 2016-05-20 NOTE — Telephone Encounter (Signed)
Spoke with the pharmacy and they will only take a written prescription for the freestyle Natasha Hernandez- could you write one out and I will fax it

## 2016-05-20 NOTE — Telephone Encounter (Signed)
Spoke to the daughter and she stated an understanding- did verbal order for the freestyle [place]

## 2016-05-24 ENCOUNTER — Other Ambulatory Visit: Payer: Self-pay

## 2016-05-28 ENCOUNTER — Other Ambulatory Visit: Payer: Self-pay

## 2016-05-28 MED ORDER — FREESTYLE LIBRE READER DEVI: 1.0000 | 0 refills | Status: DC

## 2016-05-28 NOTE — Telephone Encounter (Signed)
RX printed, will get Dr.Kumar to sign and fax back.

## 2016-06-07 DIAGNOSIS — S52571A Other intraarticular fracture of lower end of right radius, initial encounter for closed fracture: Secondary | ICD-10-CM | POA: Diagnosis not present

## 2016-06-21 DIAGNOSIS — L84 Corns and callosities: Secondary | ICD-10-CM | POA: Diagnosis not present

## 2016-06-21 DIAGNOSIS — I70293 Other atherosclerosis of native arteries of extremities, bilateral legs: Secondary | ICD-10-CM | POA: Diagnosis not present

## 2016-06-21 DIAGNOSIS — E1351 Other specified diabetes mellitus with diabetic peripheral angiopathy without gangrene: Secondary | ICD-10-CM | POA: Diagnosis not present

## 2016-06-21 DIAGNOSIS — L602 Onychogryphosis: Secondary | ICD-10-CM | POA: Diagnosis not present

## 2016-06-22 ENCOUNTER — Ambulatory Visit (INDEPENDENT_AMBULATORY_CARE_PROVIDER_SITE_OTHER): Payer: Medicare Other | Admitting: Cardiovascular Disease

## 2016-06-22 VITALS — BP 140/70 | HR 80 | Ht 65.0 in | Wt 158.2 lb

## 2016-06-22 DIAGNOSIS — I2510 Atherosclerotic heart disease of native coronary artery without angina pectoris: Secondary | ICD-10-CM | POA: Diagnosis not present

## 2016-06-22 DIAGNOSIS — I779 Disorder of arteries and arterioles, unspecified: Secondary | ICD-10-CM | POA: Diagnosis not present

## 2016-06-22 DIAGNOSIS — E7800 Pure hypercholesterolemia, unspecified: Secondary | ICD-10-CM | POA: Diagnosis not present

## 2016-06-22 DIAGNOSIS — I739 Peripheral vascular disease, unspecified: Secondary | ICD-10-CM | POA: Diagnosis not present

## 2016-06-22 DIAGNOSIS — I10 Essential (primary) hypertension: Secondary | ICD-10-CM | POA: Diagnosis not present

## 2016-06-22 NOTE — Progress Notes (Signed)
Cardiology Office Note   Date:  06/22/2016   ID:  Natasha Hernandez, DOB [birth year], MRN [id number]  PCP:  No PCP Per Patient  Cardiologist:  New  Chief Complaint  Patient presents with  . 6 month office visit      History of Present Illness: Natasha Hernandez is a 76 y.o. female who is here today for a follow-up visit. She moved last year from [place] to be close to her family. She has extensive medical problems that include coronary artery disease status post CABG in 1996 at [facility], type 1 diabetes since she was 76 years old, hypertension, hyperlipidemia, peripheral arterial disease and carotid artery disease. Most recent cardiac catheterization was done in 2013 which showed patent grafts including SVG to OM, SVG to diagonal, SVG to PDA and LIMA to LAD. She is known to have occluded right carotid artery with most recent Doppler done in September 2017. She is a lifelong nonsmoker. There is no family history of coronary artery disease. She is known to have peripheral arterial disease with no significant claudication. She has symptoms of neuropathy. Noninvasive vascular evaluation in August 2017 showed an ABI of 0.85 on the right and 1.1 on the left. Duplex showed no obstructive aortoiliac or SFA disease. There was one-vessel runoff below the knee bilaterally. She denies any chest pain or significant dyspnea. She fell in December and fractured her right wrist. She underwent surgery. She was switched from simvastatin to rosuvastatin.   Past Medical History:  Diagnosis Date  . Carotid artery occlusion   . Coronary artery disease    CABG in 1996. Most recent cardiac catheterization in 2013 showed patent grafts including LIMA to LAD, SVG to OM, SVG to diagonal and SVG to PDA.  . Diabetes mellitus without complication (HCC)   . Hyperlipidemia   . Hypertension   . PAD (peripheral artery disease) (HCC)     Past Surgical History:  Procedure Laterality Date  . CORONARY  ARTERY BYPASS GRAFT  1996     Current Outpatient Prescriptions  Medication Sig Dispense Refill  . aspirin 325 MG tablet Take 325 mg by mouth every Monday, Wednesday, and Friday.     . Calcium Carbonate (CALCIUM 600 PO) Take 600-1,200 tablets by mouth 2 (two) times daily. 1 tablet in the morning , and 1 tablet in the evenung    . carvedilol (COREG) 6.25 MG tablet Take 1 tablet (6.25 mg total) by mouth 2 (two) times daily with a meal. 180 tablet 3  . Coenzyme Q10 (CO Q 10 PO) Take 1 capsule by mouth daily.    . Continuous Blood Gluc Receiver (FREESTYLE LIBRE READER) DEVI 1 Device by Does not apply route every 30 (thirty) days. 3 Device 0  . Continuous Blood Gluc Sensor (FREESTYLE LIBRE SENSOR SYSTEM) MISC Inject 3 Devices into the skin every 14 (fourteen) days. Dx code- E10.65 NDC- [id number] 3 each 2  . glucose blood (ACCU-CHEK GUIDE) test strip Use to check blood sugar 5 times per day. Dx code E10.9 100 each 12  . insulin aspart (NOVOLOG) 100 UNIT/ML injection Inject 2-15 Units into the skin 3 (three) times daily before meals. Sliding scale: 0-150 equals 2 units, increase 1 units for every 50 blood sugar reading after 150. Formula for carbs is number of carbs divided by 5.    . insulin degludec (TRESIBA FLEXTOUCH) 100 UNIT/ML SOPN FlexTouch Pen Inject 11 units daily 4 pen 3  . insulin glargine (LANTUS) 100 UNIT/ML injection Inject  5 Units into the skin 2 (two) times daily.     . isosorbide mononitrate (IMDUR) 60 MG 24 hr tablet Take 30 mg by mouth every morning. TAke 1 tablet by mouth every evening    . lisinopril (PRINIVIL,ZESTRIL) 20 MG tablet Take 1 tablet (20 mg total) by mouth daily. 90 tablet 3  . Multiple Vitamin (MULTIVITAMIN) tablet Take 1 tablet by mouth daily.    . nitroGLYCERIN (NITROLINGUAL) 0.4 MG/SPRAY spray Place 1 spray under the tongue every 5 (five) minutes x 3 doses as needed for chest pain (Call 911 if taking 3rd spray). 4.9 g 1  . rosuvastatin (CRESTOR) 20 MG tablet Take 1  tablet (20 mg total) by mouth daily. 30 tablet 3   No current facility-administered medications for this visit.     Allergies:   Patient has no known allergies.    Social History:  The patient  reports that she has never smoked. She has never used smokeless tobacco. She reports that she does not drink alcohol.   Family History:  The patient's Family history is negative for coronary artery disease.   ROS:  Please see the history of present illness.   Otherwise, review of systems are positive for none.   All other systems are reviewed and negative.    PHYSICAL EXAM: VS:  BP 140/70   Pulse 80   Ht 5\' 5"  (1.651 m)   Wt 158 lb 3.2 oz (71.8 kg)   BMI 26.33 kg/m  , BMI Body mass index is 26.33 kg/m. GEN: Well nourished, well developed, in no acute distress  HEENT: normal  Neck: no JVD, carotid bruits, or masses Cardiac: RRR; no murmurs, rubs, or gallops,no edema  Respiratory:  clear to auscultation bilaterally, normal work of breathing GI: soft, nontender, nondistended, + BS MS: no deformity or atrophy  Skin: warm and dry, no rash Neuro:  Strength and sensation are intact Psych: euthymic mood, full affect Vascular: Femoral pulses are normal. Dorsalis pedis is palpable bilaterally.  EKG:  EKG is not ordered today.   Recent Labs: 05/04/2016: ALT 19; BUN 32; Creatinine, Ser 1.04; Potassium 4.7; Sodium 138; TSH 2.53    Lipid Panel    Component Value Date/Time   CHOL 216 (H) 05/04/2016 1535   TRIG 73.0 05/04/2016 1535   HDL 101.60 05/04/2016 1535   CHOLHDL 2 05/04/2016 1535   VLDL 14.6 05/04/2016 1535   LDLCALC 100 (H) 05/04/2016 1535      Wt Readings from Last 3 Encounters:  06/22/16 158 lb 3.2 oz (71.8 kg)  05/07/16 152 lb (68.9 kg)  04/07/16 155 lb (70.3 kg)       No flowsheet data found.    ASSESSMENT AND PLAN:  1.  Coronary artery disease involving native coronary arteries without angina: She denies anginal symptoms at the present time. Continue medical  therapy. Most recent cardiac catheterization in 2013 showed patent grafts.  2. Peripheral arterial disease: Currently with no significant claudication and no evidence of critical limb ischemia. Continue medical therapy.   3. 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   Repeat carotid Doppler in October.  4. Hyperlipidemia: LDL was 100 on simvastatin and since then she has been switched to  rosuvastatin 20 mg daily. Hopefully with this which her LDL is going to be less than 70.  5. Essential hypertension: Blood pressure is reasonably controlled on current medications.     Disposition:   FU with me in 6 months  Signed,  Lorine BearsMuhammad Iren Whipp,  MD  06/22/2016 8:43 AM    [facility]

## 2016-06-22 NOTE — Patient Instructions (Signed)
Medication Instructions:  Your physician recommends that you continue on your current medications as directed. Please refer to the Current Medication list given to you today.  Labwork: No new orders.   Testing/Procedures: Your physician has requested that you have a carotid duplex in October. This test is an ultrasound of the carotid arteries in your neck. It looks at blood flow through these arteries that supply the brain with blood. Allow one hour for this exam. There are no restrictions or special instructions.  Follow-Up: Your physician wants you to follow-up in: October with Dr Kirke CorinArida.  You will receive a reminder letter in the mail two months in advance. If you don't receive a letter, please call our office to schedule the follow-up appointment.   Any Other Special Instructions Will Be Listed Below (If Applicable).     If you need a refill on your cardiac medications before your next appointment, please call your pharmacy.

## 2016-06-25 ENCOUNTER — Other Ambulatory Visit: Payer: Self-pay

## 2016-06-25 MED ORDER — ROSUVASTATIN CALCIUM 20 MG PO TABS: 20.0000 mg | ORAL_TABLET | Freq: Every day | ORAL | 2 refills | Status: DC

## 2016-07-05 ENCOUNTER — Other Ambulatory Visit (INDEPENDENT_AMBULATORY_CARE_PROVIDER_SITE_OTHER): Payer: Medicare Other

## 2016-07-05 DIAGNOSIS — E7800 Pure hypercholesterolemia, unspecified: Secondary | ICD-10-CM | POA: Diagnosis not present

## 2016-07-05 DIAGNOSIS — E1065 Type 1 diabetes mellitus with hyperglycemia: Secondary | ICD-10-CM

## 2016-07-05 LAB — COMPREHENSIVE METABOLIC PANEL
ALBUMIN: 3.9 g/dL (ref 3.5–5.2)
ALT: 18 U/L (ref 0–35)
AST: 17 U/L (ref 0–37)
Alkaline Phosphatase: 81 U/L (ref 39–117)
BUN: 25 mg/dL — ABNORMAL HIGH (ref 6–23)
CALCIUM: 9.4 mg/dL (ref 8.4–10.5)
CHLORIDE: 104 meq/L (ref 96–112)
CO2: 29 meq/L (ref 19–32)
CREATININE: 0.78 mg/dL (ref 0.40–1.20)
GFR: 76.67 mL/min (ref >60.00)
Glucose, Bld: 199 mg/dL — ABNORMAL HIGH (ref 70–99)
Potassium: 4.4 mEq/L (ref 3.5–5.1)
Sodium: 139 mEq/L (ref 135–145)
Total Bilirubin: 0.4 mg/dL (ref 0.2–1.2)
Total Protein: 6.2 g/dL (ref 6.0–8.3)

## 2016-07-05 LAB — LIPID PANEL
CHOL/HDL RATIO: 2
CHOLESTEROL: 202 mg/dL — AB (ref 0–200)
HDL: 108.7 mg/dL (ref >39.00)
LDL CALC: 86 mg/dL (ref 0–99)
NonHDL: 92.96
TRIGLYCERIDES: 36 mg/dL (ref 0.0–149.0)
VLDL: 7.2 mg/dL (ref 0.0–40.0)

## 2016-07-05 LAB — HEMOGLOBIN A1C: Hgb A1c MFr Bld: 7.1 % — ABNORMAL HIGH (ref 4.6–6.5)

## 2016-07-09 ENCOUNTER — Encounter: Payer: Self-pay | Admitting: Endocrinology

## 2016-07-09 ENCOUNTER — Ambulatory Visit (INDEPENDENT_AMBULATORY_CARE_PROVIDER_SITE_OTHER): Payer: Medicare Other | Admitting: Endocrinology

## 2016-07-09 VITALS — BP 122/62 | HR 78 | Wt 157.0 lb

## 2016-07-09 DIAGNOSIS — I2510 Atherosclerotic heart disease of native coronary artery without angina pectoris: Secondary | ICD-10-CM

## 2016-07-09 DIAGNOSIS — E7800 Pure hypercholesterolemia, unspecified: Secondary | ICD-10-CM | POA: Diagnosis not present

## 2016-07-09 DIAGNOSIS — I10 Essential (primary) hypertension: Secondary | ICD-10-CM

## 2016-07-09 DIAGNOSIS — E1065 Type 1 diabetes mellitus with hyperglycemia: Secondary | ICD-10-CM

## 2016-07-09 NOTE — Progress Notes (Signed)
Patient ID: Natasha Hernandez, female   DOB: 07-03-1940, 76 y.o.   MRN: [id number]            Reason for Appointment : f/u for Type 1 Diabetes  History of Present Illness          Diagnosis: Type 1 diabetes mellitus, date of diagnosis: 1953          Previous history:  She thinks she has been on various insulin regimens over the years but has been on Lantus for 10-12 years along with mealtime insulin Previously followed by an endocrinologist but no records available. Her A1c has usually been around 7% reportedly and was 7.2 in 7/16   Recent history:     INSULIN regimen is: [place] 11 units daily, NOVOLOG carbohydrate coveragen 1:5 and 1: 50 correction factor at meals  Current management, blood sugar patterns and problems identified:    Because of tendency to variable blood sugars and hypoglycemia her Lantus was changed to [place] in late January  With this her blood sugars appear to be much more stable, previously were fluctuating significantly including periodic hypoglycemia at all times but blood sugars as low as 48 and as high as 298  Also she is starting to use the freestyle Lafferty sensor now, however she is continuing to use fingersticks with her Accu-Chek also about 2-3 times a day on an average; paperwork was sent in for her supply of freestyle sensors from mail-order DME  She thinks that her sensor readings may be occasionally different than the fingersticks by as much as 40 mg  Overnight blood sugars appear to be fairly good with some variability, starting of relatively higher around 140 average at midnight and averaging about 100 at 4 AM  She thinks that when she is having some pain in her fingers she is concerned her sugar is getting low during the night  POSTPRANDIAL readings are mostly higher after breakfast when she is eating cereal which she has cut back on recently  Postprandial readings after evening meal are overall fairly good recently although sometimes as high  readings at bedtime  She was trying to use 1:4 coverage for her breakfast but she thinks this was causing low sugars and she is now using 1:5 also for this meal and trying to eat more protein like eggs  HYPOGLYCEMIA has occurred mostly late morning before lunch or before supper time but no documented low she readings in the last 2 weeks.  Usually trying to take NovoLog right before eating but sometimes will take this after eating        Glucose monitoring:  is being done 3-5 times a day         Glucometer: Accu-Chek guide  Blood Glucose readings as below from both Accu-Chek and FreeStyle Libre downloads  Mean values apply above for all meters except median for One Touch  PRE-MEAL Fasting Lunch Dinner Bedtime Overall  Glucose range: 107-172  65-143   103-254    Mean/median: 127   123  129  135    POST-MEAL PC Breakfast PC Lunch PC Dinner  Glucose range:     Mean/median: 160  127  123     Hypoglycemia: As above Factors causing hyperglycemia: Decrease intake and increased insulin coverage, increased physical activity, more in the afternoons  Symptoms of hypoglycemia: Some weakness, confusion at times Treatment of hypoglycemia: Corn syrup or jellybeans/other sweets.  She will get 15 g of carbohydrate  Self-care: The diet that the patient has  been following is: Carbohydrate counting  Mealtimes are: Breakfast AT 8.30 AM Dinner: 7.30   For breakfast will have toast with cheese or egg, sometimes cereal and egg Periodically eating out         Exercise: less recently otherwise some walking and exercise bike         Dietician consultation: Most recent: Years ago .         CDE consultation: 04/28/16    Lab Results  Component Value Date   HGBA1C 7.1 (H) 07/05/2016   HGBA1C 6.6 04/02/2016   Lab Results  Component Value Date   MICROALBUR <0.7 05/04/2016   LDLCALC 86 07/05/2016   CREATININE 0.78 07/05/2016    Lab Results  Component Value Date   MICRALBCREAT 0.8 05/04/2016      Allergies as of 07/09/2016   No Known Allergies     Medication List       Accurate as of 07/09/16  1:04 PM. Always use your most recent med list.          aspirin 325 MG tablet Take 325 mg by mouth every Monday, Wednesday, and Friday.   CALCIUM 600 PO Take 600-1,200 tablets by mouth 2 (two) times daily. 1 tablet in the morning , and 1 tablet in the evenung   carvedilol 6.25 MG tablet Commonly known as:  COREG Take 1 tablet (6.25 mg total) by mouth 2 (two) times daily with a meal.   CO Q 10 PO Take 1 capsule by mouth daily.   FREESTYLE LIBRE READER Devi 1 Device by Does not apply route every 30 (thirty) days.   FREESTYLE LIBRE SENSOR SYSTEM Misc Inject 3 Devices into the skin every 14 (fourteen) days. Dx code- E10.65 NDC- 16109604540   glucose blood test strip Commonly known as:  ACCU-CHEK GUIDE Use to check blood sugar 5 times per day. Dx code E10.9   insulin aspart 100 UNIT/ML injection Commonly known as:  novoLOG Inject 2-15 Units into the skin 3 (three) times daily before meals. Sliding scale: 0-150 equals 2 units, increase 1 units for every 50 blood sugar reading after 150. Formula for carbs is number of carbs divided by 5.   insulin degludec 100 UNIT/ML Sopn FlexTouch Pen Commonly known as:  TRESIBA FLEXTOUCH Inject 11 units daily   isosorbide mononitrate 60 MG 24 hr tablet Commonly known as:  IMDUR Take 30 mg by mouth every morning. TAke 1 tablet by mouth every evening   lisinopril 20 MG tablet Commonly known as:  PRINIVIL,ZESTRIL Take 1 tablet (20 mg total) by mouth daily.   multivitamin tablet Take 1 tablet by mouth daily.   nitroGLYCERIN 0.4 MG/SPRAY spray Commonly known as:  NITROLINGUAL Place 1 spray under the tongue every 5 (five) minutes x 3 doses as needed for chest pain (Call 911 if taking 3rd spray).   rosuvastatin 20 MG tablet Commonly known as:  CRESTOR Take 1 tablet (20 mg total) by mouth daily.       Allergies: No Known  Allergies  Past Medical History:  Diagnosis Date  . Carotid artery occlusion   . Coronary artery disease    CABG in 1996. Most recent cardiac catheterization in 2013 showed patent grafts including LIMA to LAD, SVG to OM, SVG to diagonal and SVG to PDA.  . Diabetes mellitus without complication (HCC)   . Hyperlipidemia   . Hypertension   . PAD (peripheral artery disease) (HCC)     Past Surgical History:  Procedure Laterality Date  . CORONARY  ARTERY BYPASS GRAFT  1996    No family history on file.  Social History:  reports that she has never smoked. She has never used smokeless tobacco. She reports that she does not drink alcohol. Her drug history is not on file.      Review of Systems   Lipids:  She is now on Crestor 20 mg daily, previously LDL was relatively high with 40 mg Zocor  Has history of CAD in the past  Lab Results  Component Value Date   CHOL 202 (H) 07/05/2016   HDL 108.70 07/05/2016   LDLCALC 86 07/05/2016   TRIG 36.0 07/05/2016   CHOLHDL 2 07/05/2016    Has had proliferative retinopathy, report of last exam not available  LABS:  Lab on 07/05/2016  Component Date Value Ref Range Status  . Hgb A1c MFr Bld 07/05/2016 7.1* 4.6 - 6.5 % Final  . Cholesterol 07/05/2016 202* 0 - 200 mg/dL Final  . Triglycerides 07/05/2016 36.0  0.0 - 149.0 mg/dL Final  . HDL 16/10/960403/19/2018 108.70  >39.00 mg/dL Final  . VLDL 54/09/811903/19/2018 7.2  0.0 - 14.740.0 mg/dL Final  . LDL Cholesterol 07/05/2016 86  0 - 99 mg/dL Final  . Total CHOL/HDL Ratio 07/05/2016 2   Final  . NonHDL 07/05/2016 92.96   Final  . Sodium 07/05/2016 139  135 - 145 mEq/L Final  . Potassium 07/05/2016 4.4  3.5 - 5.1 mEq/L Final  . Chloride 07/05/2016 104  96 - 112 mEq/L Final  . CO2 07/05/2016 29  19 - 32 mEq/L Final  . Glucose, Bld 07/05/2016 199* 70 - 99 mg/dL Final  . BUN 82/95/621303/19/2018 25* 6 - 23 mg/dL Final  . Creatinine, Ser 07/05/2016 0.78  0.40 - 1.20 mg/dL Final  . Total Bilirubin 07/05/2016 0.4  0.2 -  1.2 mg/dL Final  . Alkaline Phosphatase 07/05/2016 81  39 - 117 U/L Final  . AST 07/05/2016 17  0 - 37 U/L Final  . ALT 07/05/2016 18  0 - 35 U/L Final  . Total Protein 07/05/2016 6.2  6.0 - 8.3 g/dL Final  . Albumin 08/65/784603/19/2018 3.9  3.5 - 5.2 g/dL Final  . Calcium 96/29/528403/19/2018 9.4  8.4 - 10.5 mg/dL Final  . GFR 13/24/401003/19/2018 76.67  >60.00 mL/min Final    Physical Examination:  BP 122/62   Pulse 78   Wt 157 lb (71.2 kg)   SpO2 98%   BMI 26.13 kg/m      ASSESSMENT:  Diabetes type 1, long-standing with overall fairly good control and A1c usually near 7% See history of present illness for detailed discussion of current diabetes management, blood sugar patterns and problems identified  A1c is Slightly higher at 7.1 compared to 6.6 before but she is having less hypoglycemia now, especially in the last couple of weeks Her blood sugars are much more stable and less fluctuating with less hypoglycemia also when she switched from Lantus to [place] She is still having multiple questions about day-to-day management discussed today including adjustment of Novolog for blood sugars, different types of carbohydrates, timing of insulin and accuracy of FreeStyle sensor compared to fingersticks Also discussed only adjusting [place] when blood sugars are out of range fasting  LIPIDS:  Better controlled with Crestor and LDL below 100  Hypertension: Well controlled with lisinopril 20 mg with normal renal function and potassium   PLAN:    She will continue the same dose of [place]iba and not change currently on less her blood sugars are consistently  abnormal in the morning  Try to do the NovoLog insulin before eating as much is possible  May consider reducing her carbohydrate coverage at suppertime but will review again on the next visit  She had several questions regarding day-to-day management and these were answered  She should be able to be more flexible with her mealtimes especially breakfast and  lunch since she is not getting low sugars compared to when she was on Lantus  Advised her  that she can reduce her mealtime insulin by 0.5-1 units if her blood sugars low normal before eating  Continue Crestor   Patient Instructions  Take Novolog before meals  Counseling time on subjects discussed above is over 50% of today's 25 minute visit    Natasha Hernandez 07/09/2016, 1:04 PM   Note: This note was prepared with Dragon voice recognition system technology. Any transcriptional errors that result from this process are unintentional.

## 2016-07-09 NOTE — Patient Instructions (Signed)
Take Novolog before meals

## 2016-07-10 DIAGNOSIS — I10 Essential (primary) hypertension: Secondary | ICD-10-CM | POA: Insufficient documentation

## 2016-07-12 DIAGNOSIS — S52571D Other intraarticular fracture of lower end of right radius, subsequent encounter for closed fracture with routine healing: Secondary | ICD-10-CM | POA: Diagnosis not present

## 2016-07-30 ENCOUNTER — Encounter (HOSPITAL_COMMUNITY): Payer: Self-pay | Admitting: Family Medicine

## 2016-07-30 ENCOUNTER — Ambulatory Visit (HOSPITAL_COMMUNITY)
Admission: EM | Admit: 2016-07-30 | Discharge: 2016-07-30 | Disposition: A | Discharge status: Home / Self Care | Payer: Medicare Other | Attending: Family Medicine | Admitting: Family Medicine

## 2016-07-30 DIAGNOSIS — H900 Conductive hearing loss, bilateral: Secondary | ICD-10-CM

## 2016-07-30 DIAGNOSIS — H6123 Impacted cerumen, bilateral: Secondary | ICD-10-CM | POA: Diagnosis not present

## 2016-07-30 NOTE — ED Triage Notes (Signed)
Pt here to have both ears cleaned out due to needing new hearing aid in right ear.

## 2016-07-30 NOTE — ED Provider Notes (Signed)
[facility]    CSN: [id number] Arrival date & time: 07/30/16  1011     History   Chief Complaint Chief Complaint  Patient presents with  . Ear Fullness    HPI Natasha Hernandez is a 76 y.o. female with a history of decreased hearing presenting for cerumen removal. She was being fitted for hearing aids at [facility] when she was told they were unable to insert them due to earwax buildup. They do not offer irrigation services, so she was referred to urgent care. She's tried no interventions, reports constant bilateral ear fullness without pain or drainage or fever. No associated URI symptoms or sick contacts.   HPI  Past Medical History:  Diagnosis Date  . Carotid artery occlusion   . Coronary artery disease    CABG in 1996. Most recent cardiac catheterization in 2013 showed patent grafts including LIMA to LAD, SVG to OM, SVG to diagonal and SVG to PDA.  . Diabetes mellitus without complication (HCC)   . Hyperlipidemia   . Hypertension   . PAD (peripheral artery disease) [facility])     Patient Active Problem List   Diagnosis Date Noted  . Essential hypertension 07/10/2016  . Uncontrolled type 1 diabetes mellitus with hyperglycemia (HCC) 05/07/2016  . Pure hypercholesterolemia 05/07/2016  . 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 CORONARY ARTERY BYPASS GRAFT  1996    OB History    No data available       Home Medications    Prior to Admission medications   Medication Sig Start Date End Date Taking? Authorizing Provider  aspirin 325 MG tablet Take 325 mg by mouth every Monday, Wednesday, and Friday.     Historical Provider, MD  Calcium Carbonate (CALCIUM 600 PO) Take 600-1,200 tablets by mouth 2 (two) times daily. 1 tablet in the morning , and 1 tablet in the evenung    Historical Provider, MD  carvedilol (COREG) 6.25 MG tablet Take 1 tablet (6.25 mg total)  by mouth 2 (two) times daily with a meal. 01/26/16   Iran Ouch, MD  Coenzyme Q10 (CO Q 10 PO) Take 1 capsule by mouth daily.    Historical Provider, MD  Continuous Blood Gluc Receiver (FREESTYLE LIBRE READER) DEVI 1 Device by Does not apply route every 30 (thirty) days. 05/28/16   Reather Littler, MD  Continuous Blood Gluc Sensor (FREESTYLE LIBRE SENSOR SYSTEM) MISC Inject 3 Devices into the skin every 14 (fourteen) days. Dx code- E10.65 NDC- 95621308657 05/11/16   Reather Littler, MD  glucose blood (ACCU-CHEK GUIDE) test strip Use to check blood sugar 5 times per day. Dx code E10.9 04/08/16   Reather Littler, MD  insulin aspart (NOVOLOG) 100 UNIT/ML injection Inject 2-15 Units into the skin 3 (three) times daily before meals. Sliding scale: 0-150 equals 2 units, increase 1 units for every 50 blood sugar reading after 150. Formula for carbs is number of carbs divided by 5.    Historical Provider, MD  insulin degludec (TRESIBA FLEXTOUCH) 100 UNIT/ML SOPN FlexTouch Pen Inject 11 units daily 05/11/16   Reather Littler, MD  isosorbide mononitrate (IMDUR) 60 MG 24 hr tablet Take 30 mg by mouth every morning. TAke 1 tablet by mouth every evening    Historical Provider, MD  lisinopril (PRINIVIL,ZESTRIL) 20 MG tablet Take 1 tablet (20 mg total) by mouth daily. 03/10/16   Corky Crafts, MD  Multiple Vitamin (  MULTIVITAMIN) tablet Take 1 tablet by mouth daily.    Historical Provider, MD  nitroGLYCERIN (NITROLINGUAL) 0.4 MG/SPRAY spray Place 1 spray under the tongue every 5 (five) minutes x 3 doses as needed for chest pain (Call 911 if taking 3rd spray). Patient not taking: Reported on 07/09/2016 01/27/16   Iran Ouch, MD  rosuvastatin (CRESTOR) 20 MG tablet Take 1 tablet (20 mg total) by mouth daily. 06/25/16   Reather Littler, MD    Family History No family history on file.  Social History Social History  Substance Use Topics  . Smoking status: Never Smoker  . Smokeless tobacco: Never Used  . Alcohol use No      Allergies   Patient has no known allergies.   Review of Systems Review of Systems Per HPI.  Physical Exam Triage Vital Signs ED Triage Vitals [07/30/16 1029]  Enc Vitals Group     BP 125/68     Pulse Rate 76     Resp 18     Temp 98.3 F (36.8 C)     Temp src      SpO2 98 %     Weight      Height      Head Circumference      Peak Flow      Pain Score      Pain Loc      Pain Edu?      Excl. in GC?    No data found.   Updated Vital Signs BP 125/68   Pulse 76   Temp 98.3 F (36.8 C)   Resp 18   SpO2 98%   Visual Acuity Right Eye Distance:   Left Eye Distance:   Bilateral Distance:    Right Eye Near:   Left Eye Near:    Bilateral Near:     Physical Exam  Constitutional: She appears well-developed and well-nourished. No distress.  HENT:  Head: Normocephalic and atraumatic.  Nose: Nose normal.  Mouth/Throat: Oropharynx is clear and moist. No oropharyngeal exudate.  bilateral black cerumen impactions.   Eyes: Conjunctivae are normal. Pupils are equal, round, and reactive to light. Right eye exhibits no discharge. Left eye exhibits no discharge.  Neck: Normal range of motion. Neck supple.  Cardiovascular: Normal rate and regular rhythm.   Pulmonary/Chest: Effort normal and breath sounds normal.  Skin: Capillary refill takes less than 2 seconds.   UC Treatments / Results  Labs (all labs ordered are listed, but only abnormal results are displayed) Labs Reviewed - No data to display  EKG  EKG Interpretation None       Radiology No results found.  Procedures Procedures (including critical care time)  Medications Ordered in UC Medications - No data to display   Initial Impression / Assessment and Plan / UC Course  I have reviewed the triage vital signs and the nursing notes.  Pertinent labs & imaging results that were available during my care of the patient were reviewed by me and considered in my medical decision making (see chart for  details).   Final Clinical Impressions(s) / UC Diagnoses   Final diagnoses:  Bilateral impacted cerumen  Conductive hearing loss, bilateral   76 y.o. female appearing well presenting for cerumen impactions cleared by saline irrigation without complication. Recommended avoiding q-tips, taking holiday from hearing aid while irritation heals. Patient has referral to audiology for hearing aid fitting and will return if ear pain, drainage, or fever.    New Prescriptions New Prescriptions  No medications on file     Tyrone Nine, MD 07/30/16 1143

## 2016-08-02 ENCOUNTER — Telehealth: Payer: Self-pay | Admitting: Endocrinology

## 2016-08-02 NOTE — Telephone Encounter (Signed)
Left vm informing patient to call PCP for the referral

## 2016-08-02 NOTE — Telephone Encounter (Signed)
Patient need a written referral to [facility] to see Dr. Freida Busman for decreesed hearing. [phone]

## 2016-08-02 NOTE — Telephone Encounter (Signed)
This is normally done by primary care physician

## 2016-08-03 NOTE — Telephone Encounter (Signed)
Brandi patient ask you to call concerning moms [phone]

## 2016-08-03 NOTE — Telephone Encounter (Signed)
Patient is currently looking for PCP and the daughter is wondering if you could do this for her this time since it takes a little bit to get established with a new PCP

## 2016-08-06 ENCOUNTER — Encounter: Payer: Self-pay | Admitting: Podiatry

## 2016-08-06 ENCOUNTER — Ambulatory Visit (INDEPENDENT_AMBULATORY_CARE_PROVIDER_SITE_OTHER): Payer: Medicare Other | Admitting: Podiatry

## 2016-08-06 VITALS — BP 153/85 | HR 76

## 2016-08-06 DIAGNOSIS — M2042 Other hammer toe(s) (acquired), left foot: Secondary | ICD-10-CM

## 2016-08-06 DIAGNOSIS — L84 Corns and callosities: Secondary | ICD-10-CM | POA: Diagnosis not present

## 2016-08-06 DIAGNOSIS — Q828 Other specified congenital malformations of skin: Secondary | ICD-10-CM | POA: Diagnosis not present

## 2016-08-06 DIAGNOSIS — E1049 Type 1 diabetes mellitus with other diabetic neurological complication: Secondary | ICD-10-CM

## 2016-08-06 DIAGNOSIS — I2510 Atherosclerotic heart disease of native coronary artery without angina pectoris: Secondary | ICD-10-CM

## 2016-08-06 DIAGNOSIS — M2041 Other hammer toe(s) (acquired), right foot: Secondary | ICD-10-CM

## 2016-08-06 NOTE — Progress Notes (Signed)
   Subjective:    Patient ID: Natasha Hernandez, female    DOB: [birth year], 76 y.o.   MRN: [id number]  HPI  this patient presents to the office with chief complaint of painful callus both feet. She says that the calluses are painful walking and wearing her shoes. She also says the calluses burn when she stands. Patient states that she has been a diabetic for over 75 years. She also says that she needs to acquire a new pair of diabetic shoes. She will be treated for her calluses and we will evaluate her for diabetic shoes     Review of Systems  HENT: Positive for hearing loss.   Neurological: Positive for numbness.  Hematological:       Slow to heal       Objective:   Physical Exam GENERAL APPEARANCE: Alert, conversant. Appropriately groomed. No acute distress.  VASCULAR: Pedal pulses are  barely  palpable at  [facility] and PT bilateral.  Capillary refill time is immediate to all digits,  Normal temperature gradient.  Digital hair growth is present bilateral  NEUROLOGIC: sensation is absent  to 5.07 monofilament at 5/5 sites bilateral.  Light touch is intact bilateral, Muscle strength normal.  MUSCULOSKELETAL: acceptable muscle strength, tone and stability bilateral.  Intrinsic muscluature intact bilateral.  Rectus appearance of foot and digits noted bilateral. Hammer toes  B/L.  Plantarflexed second left foot. Hammer toe  B/L.  Midfoot DJD  B/L  DERMATOLOGIC: skin color, texture, and turgor are within normal limits.  No preulcerative lesions or ulcers  are seen, no interdigital maceration noted.  No open lesions present.  Digital nails are asymptomatic. No drainage noted.         Assessment & Plan:  Capsulitis sub 2 secondary initiate diabetic footgear for this patient for diabetic peripheral neuropathy and  hammer toes and a pre-ulcerous lesion sub-2 left foot to hammer toe 2 .  Patient is to make an appointment with [facility]  for diabetic shoe evaluation.  She is to be seen by myself in three  months for preventive footcare services.  IE  Debride callus.  Initiate diabetic shoe paperwork.   Helane Gunther DPM

## 2016-08-11 NOTE — Telephone Encounter (Signed)
On your desk

## 2016-08-11 NOTE — Telephone Encounter (Signed)
This has been faxed.

## 2016-08-13 ENCOUNTER — Encounter: Payer: Self-pay | Admitting: Endocrinology

## 2016-08-13 DIAGNOSIS — H903 Sensorineural hearing loss, bilateral: Secondary | ICD-10-CM | POA: Diagnosis not present

## 2016-08-16 ENCOUNTER — Other Ambulatory Visit: Payer: Medicare Other

## 2016-09-20 ENCOUNTER — Ambulatory Visit: Payer: Medicare Other | Admitting: Orthotics

## 2016-09-27 ENCOUNTER — Encounter: Payer: Self-pay | Admitting: Physician Assistant

## 2016-09-27 ENCOUNTER — Ambulatory Visit (INDEPENDENT_AMBULATORY_CARE_PROVIDER_SITE_OTHER): Payer: Medicare Other | Admitting: Physician Assistant

## 2016-09-27 VITALS — BP 110/58 | HR 81 | Temp 97.8°F | Resp 14 | Ht 65.0 in | Wt 158.0 lb

## 2016-09-27 DIAGNOSIS — H3500 Unspecified background retinopathy: Secondary | ICD-10-CM | POA: Diagnosis not present

## 2016-09-27 DIAGNOSIS — E1065 Type 1 diabetes mellitus with hyperglycemia: Secondary | ICD-10-CM | POA: Diagnosis not present

## 2016-09-27 DIAGNOSIS — E1042 Type 1 diabetes mellitus with diabetic polyneuropathy: Secondary | ICD-10-CM | POA: Diagnosis not present

## 2016-09-27 DIAGNOSIS — Z951 Presence of aortocoronary bypass graft: Secondary | ICD-10-CM

## 2016-09-27 DIAGNOSIS — I2510 Atherosclerotic heart disease of native coronary artery without angina pectoris: Secondary | ICD-10-CM

## 2016-09-27 DIAGNOSIS — E7800 Pure hypercholesterolemia, unspecified: Secondary | ICD-10-CM

## 2016-09-27 DIAGNOSIS — I739 Peripheral vascular disease, unspecified: Secondary | ICD-10-CM | POA: Diagnosis not present

## 2016-09-27 DIAGNOSIS — I6521 Occlusion and stenosis of right carotid artery: Secondary | ICD-10-CM

## 2016-09-27 DIAGNOSIS — I10 Essential (primary) hypertension: Secondary | ICD-10-CM | POA: Diagnosis not present

## 2016-09-27 NOTE — Progress Notes (Signed)
Patient ID: Natasha Hernandez MRN: [id number], DOB: 1940-09-30, 76 y.o. Date of Encounter: 09/27/2016, 10:23 AM    Chief Complaint:  Chief Complaint  Patient presents with  . New Patient (Initial Visit)     HPI: 76 y.o. year old female presents as a new patient to establish care here.  She states that her son comes here to our practice.  She states that she moved here from [place] last year and moved here to live with her son and his wife and their family.  She has already established with the following specialists here in this area: Dr. Lucianne Muss--- for her diabetes Dr. Kirke Corin--- for Cardiology. She states that she had CABG about 20 years ago and has always seen a cardiologist since then. Dr. Stacie Acres ---Podiatry at [facility]  All of these specialists are with [facility] and all are on Epic  She states that she needs to find a retina specialist. States that she has had retina surgeries in the past. Says that she will need to see an ophthalmologist first and then they will refer her to retina specialist. Says this is what was done in [place] in the past.  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  Also today I did review her notes with cardiology. In addition to history of CABG she also has history of occluded right carotid artery and peripheral arterial disease.   She has no other specific concerns to address today other than getting referral to ophthalmology.  Home Meds:   Outpatient Medications Prior to Visit  Medication Sig Dispense Refill  . aspirin 325 MG tablet Take 325 mg by mouth every Monday, Wednesday, and Friday.     . Calcium Carbonate (CALCIUM 600 PO) Take 600-1,200 tablets by mouth 2 (two) times daily. 1 tablet in the morning , and 1 tablet in the evenung    . carvedilol (COREG) 6.25 MG tablet Take 1 tablet (6.25 mg total) by mouth 2 (two) times daily with a meal. 180 tablet 3  . Coenzyme Q10 (CO Q 10 PO) Take 1 capsule by mouth daily.    . Continuous  Blood Gluc Receiver (FREESTYLE LIBRE READER) DEVI 1 Device by Does not apply route every 30 (thirty) days. 3 Device 0  . Continuous Blood Gluc Sensor (FREESTYLE LIBRE SENSOR SYSTEM) MISC Inject 3 Devices into the skin every 14 (fourteen) days. Dx code- E10.65 NDC- 40981191478 3 each 2  . glucose blood (ACCU-CHEK GUIDE) test strip Use to check blood sugar 5 times per day. Dx code E10.9 100 each 12  . insulin aspart (NOVOLOG) 100 UNIT/ML injection Inject 2-15 Units into the skin 3 (three) times daily before meals. Sliding scale: 0-150 equals 2 units, increase 1 units for every 50 blood sugar reading after 150. Formula for carbs is number of carbs divided by 5.    . insulin degludec (TRESIBA FLEXTOUCH) 100 UNIT/ML SOPN FlexTouch Pen Inject 11 units daily 4 pen 3  . isosorbide mononitrate (IMDUR) 60 MG 24 hr tablet Take 30 mg by mouth every morning. TAke 1 tablet by mouth every evening    . lisinopril (PRINIVIL,ZESTRIL) 20 MG tablet Take 1 tablet (20 mg total) by mouth daily. 90 tablet 3  . Multiple Vitamin (MULTIVITAMIN) tablet Take 1 tablet by mouth daily.    . nitroGLYCERIN (NITROLINGUAL) 0.4 MG/SPRAY spray Place 1 spray under the tongue every 5 (five) minutes x 3 doses as needed for chest pain (Call 911 if taking 3rd spray). 4.9 g  1  . rosuvastatin (CRESTOR) 20 MG tablet Take 1 tablet (20 mg total) by mouth daily. 90 tablet 2   No facility-administered medications prior to visit.     Allergies: No Known Allergies    Review of Systems: See HPI for pertinent ROS. All other ROS negative.    Physical Exam: Blood pressure (!) 110/58, pulse 81, temperature 97.8 F (36.6 C), temperature source Oral, resp. rate 14, height 5\' 5"  (1.651 m), weight 158 lb (71.7 kg), SpO2 98 %., Body mass index is 26.29 kg/m. General:  Very Pleasant, WNWD WF. Appears in no acute distress. Neck: Supple. No thyromegaly. No lymphadenopathy. No carotid bruits. Lungs: Clear bilaterally to auscultation without wheezes,  rales, or rhonchi. Breathing is unlabored. Heart: Regular rhythm. No murmurs, rubs, or gallops. H/O Left Mastectomy Msk:  Strength and tone normal for age. Extremities/Skin: Warm and dry. No LE edema.  Neuro: Alert and oriented X 3. Moves all extremities spontaneously. Gait is normal. CNII-XII grossly in tact. Psych:  Responds to questions appropriately with a normal affect.     ASSESSMENT AND PLAN:  76 y.o. year old female with   1. Essential hypertension ---Managed by Cardiology.  2. Uncontrolled type 1 diabetes mellitus with hyperglycemia (HCC) - Ambulatory referral to Ophthalmology  3. 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) --Managed by Dr. Lucianne MussKumar and Podiatry  4. Pure hypercholesterolemia --Managed by Cardiology  5. Coronary artery disease involving native coronary artery of native heart without angina pectoris --Managed by Cardiology  6. Hx of CABG --Managed by Cardiology  7. Right carotid artery occlusion --Managed by Cardiology  8. PAD (peripheral artery disease) (HCC) --Managed by Cardiology  9. Retinopathy - Ambulatory referral to Ophthalmology  I have put in referral for ophthalmology and they can then refer to retina specialist. Since patient is seeing so many specialist who are managing her chronic medical problems I will not schedule a routine visit here. Rather, she will simply's follow-up here PRN.  [address]igned, Mary Beth StorrsDixon, [place], [facility] 09/27/2016 10:23 AM

## 2016-11-05 ENCOUNTER — Ambulatory Visit (INDEPENDENT_AMBULATORY_CARE_PROVIDER_SITE_OTHER): Payer: Medicare Other | Admitting: Podiatry

## 2016-11-05 ENCOUNTER — Encounter: Payer: Self-pay | Admitting: Podiatry

## 2016-11-05 DIAGNOSIS — M79675 Pain in left toe(s): Secondary | ICD-10-CM

## 2016-11-05 DIAGNOSIS — L84 Corns and callosities: Secondary | ICD-10-CM | POA: Diagnosis not present

## 2016-11-05 DIAGNOSIS — M2041 Other hammer toe(s) (acquired), right foot: Secondary | ICD-10-CM

## 2016-11-05 DIAGNOSIS — B351 Tinea unguium: Secondary | ICD-10-CM

## 2016-11-05 DIAGNOSIS — E1049 Type 1 diabetes mellitus with other diabetic neurological complication: Secondary | ICD-10-CM

## 2016-11-05 DIAGNOSIS — M2042 Other hammer toe(s) (acquired), left foot: Secondary | ICD-10-CM

## 2016-11-05 NOTE — Progress Notes (Signed)
   Subjective:    Patient ID: Natasha Hernandez, female    DOB: 11/29/1940, 76 y.o.   MRN: [id number]  HPI  this patient presents to the office with chief complaint of painful callus both feet. She says that the calluses are painful walking and wearing her shoes.  Patient states that she has been a diabetic for over 75 years. She says she is awaiting her diabetic shoes and desires to talk with [name].  She also has deformed big toenail left foot.    Review of Systems  HENT: Positive for hearing loss.   Neurological: Positive for numbness.  Hematological:       Slow to heal       Objective:   Physical Exam GENERAL APPEARANCE: Alert, conversant. Appropriately groomed. No acute distress.  VASCULAR: Pedal pulses are  barely  palpable at  [facility] bilateral.  Capillary refill time is immediate to all digits,  Normal temperature gradient.  Digital hair growth is present bilateral  NEUROLOGIC: sensation is absent  to 5.07 monofilament at 5/5 sites bilateral.  Light touch is intact bilateral, Muscle strength normal.  MUSCULOSKELETAL: acceptable muscle strength, tone and stability bilateral.  Intrinsic muscluature intact bilateral.  Rectus appearance of foot and digits noted bilateral. Hammer toes  B/L.  Plantarflexed second left foot. .  Midfoot DJD  B/L NAILS  Thick disfigured discolored nails left hallux.   DERMATOLOGIC: skin color, texture, and turgor are within normal limits. Patient has pre-ulcerous lesion sub 2,3 left foot.HD fifth toe right foot.           Assessment & Plan:  Capsulitis sub 2  Secondary plantarflexed 2,3 left foot.  Diabetes with neuropathy  ROV  Debride callus left foot.  Debride nail left foot.  Patient talked with Raiford NobleRick and is now awaiting her shoes.   Helane GuntherGregory Anijah Spohr DPM

## 2016-11-08 ENCOUNTER — Other Ambulatory Visit (INDEPENDENT_AMBULATORY_CARE_PROVIDER_SITE_OTHER): Payer: Medicare Other

## 2016-11-08 DIAGNOSIS — E1065 Type 1 diabetes mellitus with hyperglycemia: Secondary | ICD-10-CM | POA: Diagnosis not present

## 2016-11-08 LAB — BASIC METABOLIC PANEL
BUN: 25 mg/dL — ABNORMAL HIGH (ref 6–23)
CHLORIDE: 101 meq/L (ref 96–112)
CO2: 31 mEq/L (ref 19–32)
CREATININE: 0.87 mg/dL (ref 0.40–1.20)
Calcium: 9.4 mg/dL (ref 8.4–10.5)
GFR: 67.53 mL/min (ref >60.00)
Glucose, Bld: 121 mg/dL — ABNORMAL HIGH (ref 70–99)
Potassium: 4.4 mEq/L (ref 3.5–5.1)
SODIUM: 136 meq/L (ref 135–145)

## 2016-11-08 LAB — HEMOGLOBIN A1C: HEMOGLOBIN A1C: 7.6 % — AB (ref 4.6–6.5)

## 2016-11-12 ENCOUNTER — Ambulatory Visit (INDEPENDENT_AMBULATORY_CARE_PROVIDER_SITE_OTHER): Payer: Medicare Other | Admitting: Endocrinology

## 2016-11-12 ENCOUNTER — Encounter: Payer: Self-pay | Admitting: Endocrinology

## 2016-11-12 VITALS — BP 122/72 | HR 77 | Ht 65.0 in | Wt 160.4 lb

## 2016-11-12 DIAGNOSIS — E1065 Type 1 diabetes mellitus with hyperglycemia: Secondary | ICD-10-CM | POA: Diagnosis not present

## 2016-11-12 DIAGNOSIS — I6521 Occlusion and stenosis of right carotid artery: Secondary | ICD-10-CM

## 2016-11-12 DIAGNOSIS — E1042 Type 1 diabetes mellitus with diabetic polyneuropathy: Secondary | ICD-10-CM

## 2016-11-12 NOTE — Progress Notes (Signed)
Patient ID: Natasha Hernandez, female   DOB: 1941/01/18, 76 y.o.   MRN: [id number]            Reason for Appointment : f/u for Type 1 Diabetes  History of Present Illness          Diagnosis: Type 1 diabetes mellitus, date of diagnosis: 1953          Previous history:  She thinks she has been on various insulin regimens over the years but has been on Lantus for 10-12 years along with mealtime insulin Previously followed by an endocrinologist but no records available. Her A1c has usually been around 7% reportedly and was 7.2 in 7/16   Recent history:     INSULIN regimen is: [place] 11 units daily, NOVOLOG carbohydrate coverage 1:5 and 1: 50 correction factor at meals  Current management, blood sugar patterns and problems identified:     She is not using the freestyle Libre sensor mostly to monitor her glucose readings and occasionally will do a fingerstick also especially if she is unsure about the accuracy of the sensor  She thinks her blood sugars are generally fairly close to the sensor readings on the fingersticks with some exceptions as seen on her download today  However not clear why her A1c is higher since her blood sugars are recently averaging 148 with fairly consistent use of the sensor  HIGHEST blood sugars are either after breakfast or supper but variable  Although she is trying to use of 1:5 carbohydrate coverage and is again doing well with carbohydrate counting she finds this excessive at breakfast  However she is taking her PRE-meal insulin sometimes right at meal times or occasionally afterwards if blood sugars are low normal  HYPOGLYCEMIA has been recently minimal and rarely may feel it getting low at 4 AM  She appears to have FASTING blood sugars near normal and may be having some Dawn phenomenon at least on some days  Overall postprandial readings are fairly well controlled after lunch and may be relatively higher after supper based on her intake  She  tried to increase her Tresiba to 12 units to improve her fasting readings but with this her sugars were low during the night occasionally    She still using a 1:50 correction factor for high readings and may occasionally bolus after eating for high sugars     She is now trying to exercise on her bike and she thinks her blood sugar will go down 40 mg  Glucose monitoring:  is being done 3-5 times a day         Glucometer:  Accu-Chek guide  Blood Glucose readings as below from both Accu-Chek and FreeStyle [employer]   Mean values apply above for all meters except median for One Touch  PRE-MEAL Fasting Lunch Dinner Bedtime Overall  Glucose range: 75-1 98       Mean/median: 113 9  140 133  169  148+/-42    POST-MEAL PC Breakfast PC Lunch PC Dinner  Glucose range:     Mean/median: 158   156    Factors causing Hypoglycemia: Overestimating mealtime coverage, exercise  Symptoms of hypoglycemia: Some weakness, confusion at times Treatment of hypoglycemia: Corn syrup or jellybeans/other sweets.  She will get 15 g of carbohydrate  Self-care: The diet that the patient has been following is: Carbohydrate counting  Mealtimes are: Breakfast AT 8.30 AM Dinner: 7.30   For breakfast will have toast with cheese or egg, sometimes cereal and  egg Periodically eating out         Exercise: some walking and exercise bike          Dietician consultation: Most recent: Years ago .         CDE consultation: 04/28/16    Lab Results  Component Value Date   HGBA1C 7.6 (H) 11/08/2016   HGBA1C 7.1 (H) 07/05/2016   HGBA1C 6.6 04/02/2016   Lab Results  Component Value Date   MICROALBUR <0.7 05/04/2016   LDLCALC 86 07/05/2016   CREATININE 0.87 11/08/2016    Lab Results  Component Value Date   MICRALBCREAT 0.8 05/04/2016     Allergies as of 11/12/2016   No Known Allergies     Medication List       Accurate as of 11/12/16 12:49 PM. Always use your most recent med list.          aspirin  325 MG tablet Take 325 mg by mouth every Monday, Wednesday, and Friday.   CALCIUM 600 PO Take 600-1,200 tablets by mouth 2 (two) times daily. 1 tablet in the morning , and 1 tablet in the evenung   carvedilol 6.25 MG tablet Commonly known as:  COREG Take 1 tablet (6.25 mg total) by mouth 2 (two) times daily with a meal.   CO Q 10 PO Take 1 capsule by mouth daily.   FREESTYLE LIBRE READER Devi 1 Device by Does not apply route every 30 (thirty) days.   FREESTYLE LIBRE SENSOR SYSTEM Misc Inject 3 Devices into the skin every 14 (fourteen) days. Dx code- E10.65 NDC- [id number]   glucose blood test strip Commonly known as:  ACCU-CHEK GUIDE Use to check blood sugar 5 times per day. Dx code E10.9   insulin aspart 100 UNIT/ML injection Commonly known as:  novoLOG Inject 2-15 Units into the skin 3 (three) times daily before meals. Sliding scale: 0-150 equals 2 units, increase 1 units for every 50 blood sugar reading after 150. Formula for carbs is number of carbs divided by 5.   insulin degludec 100 UNIT/ML Sopn FlexTouch Pen Commonly known as:  TRESIBA FLEXTOUCH Inject 11 units daily   isosorbide mononitrate 60 MG 24 hr tablet Commonly known as:  IMDUR Take 30 mg by mouth every morning. TAke 1 tablet by mouth every evening   lisinopril 20 MG tablet Commonly known as:  PRINIVIL,ZESTRIL Take 1 tablet (20 mg total) by mouth daily.   multivitamin tablet Take 1 tablet by mouth daily.   nitroGLYCERIN 0.4 MG/SPRAY spray Commonly known as:  NITROLINGUAL Place 1 spray under the tongue every 5 (five) minutes x 3 doses as needed for chest pain (Call 911 if taking 3rd spray).   rosuvastatin 20 MG tablet Commonly known as:  CRESTOR Take 1 tablet (20 mg total) by mouth daily.       Allergies: No Known Allergies  Past Medical History:  Diagnosis Date  . Carotid artery occlusion   . Coronary artery disease    CABG in 1996. Most recent cardiac catheterization in 2013 showed patent  grafts including LIMA to LAD, SVG to OM, SVG to diagonal and SVG to PDA.  . Diabetes mellitus without complication (HCC)   . Hyperlipidemia   . Hypertension   . PAD (peripheral artery disease) (HCC)     Past Surgical History:  Procedure Laterality Date  . CORONARY ARTERY BYPASS GRAFT  1996    No family history on file.  Social History:  reports that she has never smoked. She has  never used smokeless tobacco. She reports that she does not drink alcohol. Her drug history is not on file.      Review of Systems   Lipids:  She is now on Crestor 20 mg daily, previously LDL was relatively high with 40 mg Zocor  Has history of CAD in the past  Lab Results  Component Value Date   CHOL 202 (H) 07/05/2016   HDL 108.70 07/05/2016   LDLCALC 86 07/05/2016   TRIG 36.0 07/05/2016   CHOLHDL 2 07/05/2016    Has had proliferative retinopathy, report of last exam not available  Neuropathy with some sensory loss: Forms filled out for diabetic shoes  LABS:  Lab on 11/08/2016  Component Date Value Ref Range Status  . Hgb A1c MFr Bld 11/08/2016 7.6* 4.6 - 6.5 % Final   Glycemic Control Guidelines for People with Diabetes:Non Diabetic:  <6%Goal of Therapy: <7%Additional Action Suggested:  >8%   . Sodium 11/08/2016 136  135 - 145 mEq/L Final  . Potassium 11/08/2016 4.4  3.5 - 5.1 mEq/L Final  . Chloride 11/08/2016 101  96 - 112 mEq/L Final  . CO2 11/08/2016 31  19 - 32 mEq/L Final  . Glucose, Bld 11/08/2016 121* 70 - 99 mg/dL Final  . BUN 91/47/829507/23/2018 25* 6 - 23 mg/dL Final  . Creatinine, Ser 11/08/2016 0.87  0.40 - 1.20 mg/dL Final  . Calcium 62/13/086507/23/2018 9.4  8.4 - 10.5 mg/dL Final  . GFR 78/46/962907/23/2018 67.53  >60.00 mL/min Final    Physical Examination:  BP 122/72   Pulse 77   Ht 5\' 5"  (1.651 m)   Wt 160 lb 6.4 oz (72.8 kg)   SpO2 98%   BMI 26.69 kg/m      ASSESSMENT:  Diabetes type 1, long-standing with overall fairly good control and A1c usually near 7% See history of present  illness for detailed discussion of current diabetes management, blood sugar patterns and problems identified  A1c is unexpectedly higher at 7.6 even though her blood sugars are not high at home This may be related to possibly less hypoglycemia She is not having consistent high sugar patterns at any time and not excessive peaking of blood sugars after meals Lowest blood sugars are around 4-6 AM and her current dose of 11 units Natasha Hernandez appears to be appropriate  Discussed day-to-day management for diabetes  Hypertension: Well controlled with lisinopril 20 mg    PLAN:    She will continue the same dose of 11 units [place]  She will try to exercise within an hour of a meal and then she can cut back her mealtime coverage by 1 unit, otherwise she can have 15 g carbohydrate snack before exercise  She will try to take her NovoLog before eating more consistently and especially at breakfast may take it up to 20 minutes before eating [place] on her blood sugar level  She will continue to use a 1:8 carbohydrate ratio for breakfast along with 1:5 at other meals  Encouraged her to continue exercise   There are no Patient Instructions on file for this visit. Counseling time on subjects discussed above is over 50% of today's 25 minute visit    Natasha Hernandez 11/12/2016, 12:49 PM   Note: This note was prepared with Dragon voice recognition system technology. Any transcriptional errors that result from this process are unintentional.

## 2016-11-19 DIAGNOSIS — E103593 Type 1 diabetes mellitus with proliferative diabetic retinopathy without macular edema, bilateral: Secondary | ICD-10-CM | POA: Diagnosis not present

## 2016-11-19 DIAGNOSIS — H35373 Puckering of macula, bilateral: Secondary | ICD-10-CM | POA: Diagnosis not present

## 2016-11-19 DIAGNOSIS — H3342 Traction detachment of retina, left eye: Secondary | ICD-10-CM | POA: Diagnosis not present

## 2016-11-19 DIAGNOSIS — H18423 Band keratopathy, bilateral: Secondary | ICD-10-CM | POA: Diagnosis not present

## 2016-11-19 DIAGNOSIS — Z961 Presence of intraocular lens: Secondary | ICD-10-CM | POA: Diagnosis not present

## 2016-11-24 ENCOUNTER — Ambulatory Visit (INDEPENDENT_AMBULATORY_CARE_PROVIDER_SITE_OTHER): Payer: Medicare Other | Admitting: Orthotics

## 2016-11-24 DIAGNOSIS — E1049 Type 1 diabetes mellitus with other diabetic neurological complication: Secondary | ICD-10-CM | POA: Diagnosis not present

## 2016-11-24 DIAGNOSIS — M2041 Other hammer toe(s) (acquired), right foot: Secondary | ICD-10-CM

## 2016-11-24 DIAGNOSIS — M2042 Other hammer toe(s) (acquired), left foot: Secondary | ICD-10-CM

## 2016-11-24 DIAGNOSIS — L84 Corns and callosities: Secondary | ICD-10-CM | POA: Diagnosis not present

## 2016-11-29 NOTE — Progress Notes (Signed)

## 2017-01-03 ENCOUNTER — Ambulatory Visit (INDEPENDENT_AMBULATORY_CARE_PROVIDER_SITE_OTHER): Payer: Medicare Other | Admitting: Podiatry

## 2017-01-03 ENCOUNTER — Other Ambulatory Visit: Payer: Self-pay | Admitting: Cardiovascular Disease

## 2017-01-03 ENCOUNTER — Encounter: Payer: Self-pay | Admitting: Podiatry

## 2017-01-03 DIAGNOSIS — L97521 Non-pressure chronic ulcer of other part of left foot limited to breakdown of skin: Secondary | ICD-10-CM | POA: Diagnosis not present

## 2017-01-03 DIAGNOSIS — I779 Disorder of arteries and arterioles, unspecified: Secondary | ICD-10-CM

## 2017-01-03 DIAGNOSIS — L03032 Cellulitis of left toe: Secondary | ICD-10-CM | POA: Diagnosis not present

## 2017-01-03 DIAGNOSIS — L02612 Cutaneous abscess of left foot: Secondary | ICD-10-CM | POA: Diagnosis not present

## 2017-01-03 DIAGNOSIS — I6521 Occlusion and stenosis of right carotid artery: Secondary | ICD-10-CM

## 2017-01-03 DIAGNOSIS — I739 Peripheral vascular disease, unspecified: Principal | ICD-10-CM

## 2017-01-03 MED ORDER — MUPIROCIN 2 % EX OINT: 1.0000 "application " | TOPICAL_OINTMENT | Freq: Two times a day (BID) | CUTANEOUS | 2 refills | Status: DC

## 2017-01-03 MED ORDER — CEPHALEXIN 500 MG PO CAPS: 500.0000 mg | ORAL_CAPSULE | Freq: Three times a day (TID) | ORAL | 2 refills | Status: DC

## 2017-01-05 NOTE — Progress Notes (Signed)
Subjective: Taryn presents the office today for concerns of a possible blister along the third toe on the inside aspect of the toe. She states that this started last night and she inserted soaking in Epson salts. She states the feeling that ruled out any thought that there is a hole on the toe. She denies any drainage or pus or any red streaks or redness that she has noticed. She is unsure pelvis started. Denies any systemic complaints such as fevers, chills, nausea, vomiting. No acute changes since last appointment, and no other complaints at this time.   Objective: AAO x3, NAD DP/PT pulses palpable bilaterally, CRT less than 3 seconds Along the lateral aspect of the left third digit appears to be an area of an old blister and there is a small superficial abrasion type wound present and there is a faint amount of surrounding erythema but there is no ascending cellulitis. There is no areas of fluctuance or crepitus. There is no malodor. There is no drainage or pus expressed.  No other open lesions or pre-ulcerative lesions.  No pain with calf compression, swelling, warmth, erythema  Assessment: Superficial wound left third toe interdigitally with mild erythema  Plan: -All treatment options discussed with the patient including all alternatives, risks, complications.  -Recommended a small amount of antibiotic ointment dressing changes daily. Prescribed mupirocin ointment -Keflex -Surgical shoe -Offloading pads -Monitor for any clinical signs or symptoms of infection and directed to call the office immediately should any occur or go to the ER. -RTC 1 week or sooner if needed.  -Patient encouraged to call the office with any questions, concerns, change in symptoms.   Ovid Curd, DPM

## 2017-01-06 ENCOUNTER — Telehealth: Payer: Self-pay | Admitting: Endocrinology

## 2017-01-06 ENCOUNTER — Other Ambulatory Visit: Payer: Self-pay

## 2017-01-06 MED ORDER — ISOSORBIDE MONONITRATE ER 60 MG PO TB24: 30.0000 mg | ORAL_TABLET | ORAL | 3 refills | Status: DC

## 2017-01-06 NOTE — Telephone Encounter (Signed)
Ordered

## 2017-01-06 NOTE — Telephone Encounter (Signed)
MEDICATION: isosorbide mononitrate (IMDUR) 60 MG 24 hr tablet    PHARMACY:  [id number] -[address]  IS THIS A 90 DAY SUPPLY : no   IS PATIENT OUT OF MEDICATION: no  IF NOT; HOW MUCH IS LEFT: 2 days  LAST APPOINTMENT DATE: 07/27  NEXT APPOINTMENT DATE: 10/19  OTHER COMMENTS:    **Let patient know to contact pharmacy at the end of the day to make sure medication is ready. **  ** Please notify patient to allow 48-72 hours to process**  **Encourage patient to contact the pharmacy for refills or they can request refills through [facility]**

## 2017-01-07 ENCOUNTER — Other Ambulatory Visit: Payer: Self-pay

## 2017-01-07 MED ORDER — ISOSORBIDE MONONITRATE ER 60 MG PO TB24: 30.0000 mg | ORAL_TABLET | ORAL | 3 refills | Status: DC

## 2017-01-08 ENCOUNTER — Other Ambulatory Visit: Payer: Self-pay | Admitting: Endocrinology

## 2017-01-10 ENCOUNTER — Encounter: Payer: Self-pay | Admitting: Podiatry

## 2017-01-10 ENCOUNTER — Ambulatory Visit (INDEPENDENT_AMBULATORY_CARE_PROVIDER_SITE_OTHER): Payer: Medicare Other | Admitting: Podiatry

## 2017-01-10 DIAGNOSIS — L97521 Non-pressure chronic ulcer of other part of left foot limited to breakdown of skin: Secondary | ICD-10-CM

## 2017-01-10 DIAGNOSIS — I6521 Occlusion and stenosis of right carotid artery: Secondary | ICD-10-CM

## 2017-01-11 ENCOUNTER — Ambulatory Visit: Payer: Medicare Other | Admitting: Podiatry

## 2017-01-12 NOTE — Progress Notes (Signed)
Subjective: Natasha Hernandez presents to the office today for follow-up evaluation of this wound to left third toe. She states that she has been on the Keflex and since doing this the redness has resolved. The pain is also improved however she is still noticing in the area in between her toes. She denies any increase in swelling or any red streaks and she denies any drainage or pus that she has noticed. She has no other concerns today. Denies any systemic complaints such as fevers, chills, nausea, vomiting. No acute changes since last appointment, and no other complaints at this time.   Objective: AAO x3, NAD DP/PT pulses palpable bilaterally, CRT less than 3 seconds Along the lateral aspect of the left third digit appears to be an area of hyperkeratotic tissue. Upon debridement there was a very superficial wound present. There is no edema, erythema, drainage or pus there is no ascending cellulitis. There is no fluctuation or crepitation. There is no malodor. No other open lesions or pre-ulcerative lesions are identified today. No other open lesions or pre-ulcerative lesions.  No pain with calf compression, swelling, warmth, erythema  Assessment: Superficial wound left third toe interdigitally with resolved erythema  Plan: -All treatment options discussed with the patient including all alternatives, risks, complications. -Today I debrided the hyperkeratotic tissue overlying the area to expose the wound. Recommend continue the small amount of antibiotic ointment dressing changes to the area daily. Continue with offloading. -Finish course of antibiotics  -Monitor for any clinical signs or symptoms of infection and directed to call the office immediately should any occur or go to the ER. -Continue surgical shoe        Ovid Curd, DPM

## 2017-01-19 ENCOUNTER — Other Ambulatory Visit: Payer: Medicare Other

## 2017-01-24 ENCOUNTER — Ambulatory Visit (INDEPENDENT_AMBULATORY_CARE_PROVIDER_SITE_OTHER): Payer: Medicare Other | Admitting: Podiatry

## 2017-01-24 ENCOUNTER — Ambulatory Visit (HOSPITAL_COMMUNITY)
Admission: RE | Admit: 2017-01-24 | Discharge: 2017-01-24 | Disposition: A | Discharge status: Home / Self Care | Payer: Medicare Other | Source: Ambulatory Visit | Attending: Cardiology | Admitting: Cardiology

## 2017-01-24 ENCOUNTER — Encounter: Payer: Self-pay | Admitting: Podiatry

## 2017-01-24 DIAGNOSIS — B351 Tinea unguium: Secondary | ICD-10-CM | POA: Diagnosis not present

## 2017-01-24 DIAGNOSIS — M79675 Pain in left toe(s): Secondary | ICD-10-CM

## 2017-01-24 DIAGNOSIS — I779 Disorder of arteries and arterioles, unspecified: Secondary | ICD-10-CM

## 2017-01-24 DIAGNOSIS — L97521 Non-pressure chronic ulcer of other part of left foot limited to breakdown of skin: Secondary | ICD-10-CM | POA: Diagnosis not present

## 2017-01-24 DIAGNOSIS — I6521 Occlusion and stenosis of right carotid artery: Secondary | ICD-10-CM

## 2017-01-24 DIAGNOSIS — I6522 Occlusion and stenosis of left carotid artery: Secondary | ICD-10-CM | POA: Diagnosis not present

## 2017-01-24 DIAGNOSIS — I739 Peripheral vascular disease, unspecified: Secondary | ICD-10-CM

## 2017-01-27 NOTE — Progress Notes (Signed)
Subjective: Analena presents to the office today for follow-up evaluation of this wound to left third toe. She states that she is doing "great". She states the areas doing much better and her son also agrees. She has not had any increase in redness in the redness has resolved. Denies any swelling or any drainage. She also states her nails are thick, painful, ligated she cannot trim herself. Denies any redness or drainage or any swelling to the toenail sites. She has no new concerns today. She finished her course of antibiotics. Denies any systemic complaints such as fevers, chills, nausea, vomiting. No acute changes since last appointment, and no other complaints at this time.   Objective: AAO x3, NAD DP/PT pulses palpable bilaterally, CRT less than 3 seconds Along the lateral aspect of the left third digit appears to be an area of hyperkeratotic tissue. Upon debridement there is no definitive wound identified clipped area is pre-ulcerative. There is no edema, erythema, increase in warmth to the area and there is no drainage or pus expressed. Nails are hypertrophic, dystrophic, brittle, discolored, elongated 10. There is no strenuous or drainage. There is tenderness nails 1-5 bilaterally. There is prominence along the lateral aspect of the third toe on the bone spur palpable. No other open lesions or pre-ulcerative lesions.  No pain with calf compression, swelling, warmth, erythema  Assessment: Healed wound left third toe, symptomatic onychomycosis  Plan: -All treatment options discussed with the patient including all alternatives, risks, complications. -Today there is very minimal hyperkeratotic tissue is sharply debrided to the any complications or bleeding. Area appears to be healed. Continue offloading pads as well as daily foot inspection to ensure any further skin breakdown. -Can transition to a regular shoe but discussed for her to monitor the area very closely. -Nail sharply debrided 10  without any complications or bleeding -Monitor for any clinical signs or symptoms of infection and directed to call the office immediately should any occur or go to the ER.  Ovid Curd, DPM

## 2017-01-31 ENCOUNTER — Other Ambulatory Visit (INDEPENDENT_AMBULATORY_CARE_PROVIDER_SITE_OTHER): Payer: Medicare Other

## 2017-01-31 DIAGNOSIS — E1065 Type 1 diabetes mellitus with hyperglycemia: Secondary | ICD-10-CM

## 2017-01-31 LAB — BASIC METABOLIC PANEL
BUN: 24 mg/dL — ABNORMAL HIGH (ref 6–23)
CALCIUM: 9.6 mg/dL (ref 8.4–10.5)
CO2: 31 mEq/L (ref 19–32)
CREATININE: 0.87 mg/dL (ref 0.40–1.20)
Chloride: 102 mEq/L (ref 96–112)
GFR: 67.49 mL/min (ref >60.00)
Glucose, Bld: 102 mg/dL — ABNORMAL HIGH (ref 70–99)
Potassium: 4.7 mEq/L (ref 3.5–5.1)
SODIUM: 138 meq/L (ref 135–145)

## 2017-01-31 LAB — HEMOGLOBIN A1C: HEMOGLOBIN A1C: 7.5 % — AB (ref 4.6–6.5)

## 2017-02-04 ENCOUNTER — Ambulatory Visit (INDEPENDENT_AMBULATORY_CARE_PROVIDER_SITE_OTHER): Payer: Medicare Other | Admitting: Endocrinology

## 2017-02-04 ENCOUNTER — Ambulatory Visit: Payer: Medicare Other | Admitting: Podiatry

## 2017-02-04 ENCOUNTER — Encounter: Payer: Self-pay | Admitting: Endocrinology

## 2017-02-04 VITALS — BP 124/62 | HR 75 | Ht 65.0 in | Wt 161.1 lb

## 2017-02-04 DIAGNOSIS — E1065 Type 1 diabetes mellitus with hyperglycemia: Secondary | ICD-10-CM | POA: Diagnosis not present

## 2017-02-04 DIAGNOSIS — I6521 Occlusion and stenosis of right carotid artery: Secondary | ICD-10-CM

## 2017-02-04 NOTE — Progress Notes (Signed)
Patient ID: Natasha Hernandez, female   DOB: 05/22/1940, 76 y.o.   MRN: [id number]            Reason for Appointment : f/u for Type 1 Diabetes  History of Present Illness          Diagnosis: Type 1 diabetes mellitus, date of diagnosis: 1953          Previous history:  She thinks she has been on various insulin regimens over the years but has been on Lantus for 10-12 years along with mealtime insulin Previously followed by an endocrinologist but no records available. Her A1c has usually been around 7% reportedly and was 7.2 in 7/16   Recent history:     INSULIN regimen is: [place]iba 11 units daily, NOVOLOG carbohydrate coverage 1:5 and 1: 50 correction factor at meals  Current management, blood sugar patterns and problems identified:     She is mostly using the freestyle Libre sensor  to monitor her glucose readings and occasionally will do a fingerstick  Blood sugars were reviewed from her FreeStyle Sensor today  Again not clear why her A1c is over 7 since her blood sugars are recently averaging 148 with fairly consistent use of the sensor  HIGHEST blood sugars are probably after supper averaging about 165 and occasionally over 1 80-200  Although   Now she is trying to use of 1:5 carbohydrate coverage even at breakfast time and this does not drop her sugar too low, usually eating about 10 g of carbohydrate in the morning 2 units coverage  She has been asked to take her NovoLog a few minutes before eating and does not always do so  HYPOGLYCEMIA has been recently minimal  She had a low sugar after an evening meal eating out from overestimating her carbohydrate intake last Friday blood sugar in the 40s, rarely have a low sugar or after lunch also  She does not subtract any insulin when her blood sugars are low normal who eating  Usually has a bedtime snack for fear of hypoglycemia at night but glucose readings are mildly increased until about 4-5 AM usually  Overall  postprandial readings are fairly well controlled  FASTING readings are somewhat variable but generally 130-140  She is now trying to exercise on her bike   Glucose monitoring:  is being done 3-5 times a day         Glucometer:   Blood Glucose readings as below from [employer] Libre downloads   Mean values apply above for all meters except median for One Touch  PRE-MEAL Fasting Lunch Dinner Bedtime Overall  Glucose range: 75-1 98       Mean/median: 113 9  140 133  169  148+/-42    POST-MEAL PC Breakfast PC Lunch PC Dinner  Glucose range:     Mean/median: 158   156    Factors causing Hypoglycemia: Overestimating mealtime coverage, exercise  Symptoms of hypoglycemia: Some weakness, confusion at times Treatment of hypoglycemia: Corn syrup or jellybeans/other sweets.  She will get 15 g of carbohydrate  Self-care: The diet that the patient has been following is: Carbohydrate counting  Mealtimes are: Breakfast AT 8.30 AM Dinner: 7.30   For breakfast will have toast with cheese or egg, sometimes cereal and egg Periodically eating out         Exercise: some walking and exercise bike          Dietician consultation: Most recent: Years ago .  CDE consultation: 04/28/16    Lab Results  Component Value Date   HGBA1C 7.5 (H) 01/31/2017   HGBA1C 7.6 (H) 11/08/2016   HGBA1C 7.1 (H) 07/05/2016   Lab Results  Component Value Date   MICROALBUR <0.7 05/04/2016   LDLCALC 86 07/05/2016   CREATININE 0.87 01/31/2017    Lab Results  Component Value Date   MICRALBCREAT 0.8 05/04/2016     Allergies as of 02/04/2017   No Known Allergies     Medication List       Accurate as of 02/04/17 10:42 AM. Always use your most recent med list.          aspirin 325 MG tablet Take 325 mg by mouth every Monday, Wednesday, and Friday.   CALCIUM 600 PO Take 600-1,200 tablets by mouth 2 (two) times daily. 1 tablet in the morning , and 1 tablet in the evenung   carvedilol 6.25 MG  tablet Commonly known as:  COREG Take 1 tablet (6.25 mg total) by mouth 2 (two) times daily with a meal.   CO Q 10 PO Take 1 capsule by mouth daily.   FREESTYLE LIBRE READER Devi 1 Device by Does not apply route every 30 (thirty) days.   FREESTYLE LIBRE SENSOR SYSTEM Misc Inject 3 Devices into the skin every 14 (fourteen) days. Dx code- E10.65 NDC- 16109604540   insulin aspart 100 UNIT/ML injection Commonly known as:  novoLOG Inject 2-15 Units into the skin 3 (three) times daily before meals. Sliding scale: 0-150 equals 2 units, increase 1 units for every 50 blood sugar reading after 150. Formula for carbs is number of carbs divided by 5.   insulin degludec 100 UNIT/ML Sopn FlexTouch Pen Commonly known as:  TRESIBA FLEXTOUCH Inject 11 units daily   isosorbide mononitrate 60 MG 24 hr tablet Commonly known as:  IMDUR Take 0.5 tablets (30 mg total) by mouth every morning. TAke 1 tablet by mouth every evening   lisinopril 20 MG tablet Commonly known as:  PRINIVIL,ZESTRIL Take 1 tablet (20 mg total) by mouth daily.   multivitamin tablet Take 1 tablet by mouth daily.   nitroGLYCERIN 0.4 MG/SPRAY spray Commonly known as:  NITROLINGUAL Place 1 spray under the tongue every 5 (five) minutes x 3 doses as needed for chest pain (Call 911 if taking 3rd spray).   rosuvastatin 20 MG tablet Commonly known as:  CRESTOR TAKE 1 TABLET BY MOUTH EVERY DAY       Allergies: No Known Allergies  Past Medical History:  Diagnosis Date  . Carotid artery occlusion   . Coronary artery disease    CABG in 1996. Most recent cardiac catheterization in 2013 showed patent grafts including LIMA to LAD, SVG to OM, SVG to diagonal and SVG to PDA.  . Diabetes mellitus without complication (HCC)   . Hyperlipidemia   . Hypertension   . PAD (peripheral artery disease) (HCC)     Past Surgical History:  Procedure Laterality Date  . CORONARY ARTERY BYPASS GRAFT  1996    No family history on  file.  Social History:  reports that she has never smoked. She has never used smokeless tobacco. She reports that she does not drink alcohol. Her drug history is not on file.      Review of Systems   Lipids:  She is now on Crestor 20 mg daily, previously LDL was relatively high with 40 mg Zocor  Has history of CAD in the past  Lab Results  Component Value Date  CHOL 202 (H) 07/05/2016   HDL 108.70 07/05/2016   LDLCALC 86 07/05/2016   TRIG 36.0 07/05/2016   CHOLHDL 2 07/05/2016    Has had proliferative retinopathy, report of last exam not available  Neuropathy with some sensory loss: Forms filled out for diabetic shoes  LABS:  Lab on 01/31/2017  Component Date Value Ref Range Status  . Hgb A1c MFr Bld 01/31/2017 7.5* 4.6 - 6.5 % Final   Glycemic Control Guidelines for People with Diabetes:Non Diabetic:  <6%Goal of Therapy: <7%Additional Action Suggested:  >8%   . Sodium 01/31/2017 138  135 - 145 mEq/L Final  . Potassium 01/31/2017 4.7  3.5 - 5.1 mEq/L Final  . Chloride 01/31/2017 102  96 - 112 mEq/L Final  . CO2 01/31/2017 31  19 - 32 mEq/L Final  . Glucose, Bld 01/31/2017 102* 70 - 99 mg/dL Final  . BUN 16/01/9603 24* 6 - 23 mg/dL Final  . Creatinine, Ser 01/31/2017 0.87  0.40 - 1.20 mg/dL Final  . Calcium 54/12/8117 9.6  8.4 - 10.5 mg/dL Final  . GFR 14/78/2956 67.49  >60.00 mL/min Final    Physical Examination:  BP 124/62 (BP Location: Left Arm, Patient Position: Sitting, Cuff Size: Normal)   Pulse 75   Ht 5\' 5"  (1.651 m)   Wt 161 lb 2 oz (73.1 kg)   SpO2 97%   BMI 26.81 kg/m      ASSESSMENT:  Diabetes type 1, long-standing with overall fairly good control and A1c usually near 7% See history of present illness for detailed discussion of current diabetes management, blood sugar patterns and problems identified  She takes very small doses of insulin including basal and bolus  A1c is Slightly higher than expected for her home blood sugars but about the  same at 7.5 At the same time she is having only minimal hypoglycemia Occasional hypoglycemia related to overestimation of her carbohydrate intake is taking when going out to eat and probably not adjusting mealtime doses with low normal pre-meal blood sugars Only occasionally has had postprandial hyperglycemia after supper and rarely after breakfast Fasting blood sugars are reasonably controlled and averaging about 135 although lowest blood sugars are around 5-6 AM   Discussed day-to-day management for adjusting insulin for meals, basal insulin, blood sugar variability, timing of insulin and prevention of hypoglycemia including with exercise Discussed results of her continuous glucose monitoring and explained what patterns we are observing  Hypertension: Well controlled with lisinopril 20 mg    PLAN:    She will continue the 11 units Tresiba  When she started exercising again she may need to reduce her mealtime Novolog by 1 unit if exercising postprandially  She does not have a need for a bedtime snack if blood sugars are around 150 or more or she can take one unit of insulin for that snack  Again she needs to take her mealtime insulin 15 minutes before eating especially in the morning  If her blood sugar is low normal before eating she can cut back her insulin by 1 unit  No change in carbohydrate coverage of 1:5  Discussed blood sugar targets before and after meals and at bedtime  She can use her FreeStyle Libre sensor to do fingerstick testing if needed  Recommended influenza vaccine but she refuses   There are no Patient Instructions on file for this visit.    Shaquel Chavous 02/04/2017, 10:42 AM   Note: This note was prepared with Dragon voice recognition system technology. Any transcriptional errors  that result from this process are unintentional.

## 2017-02-13 ENCOUNTER — Other Ambulatory Visit: Payer: Self-pay | Admitting: Interventional Cardiology

## 2017-02-14 ENCOUNTER — Encounter: Payer: Self-pay | Admitting: Podiatry

## 2017-02-14 ENCOUNTER — Ambulatory Visit (INDEPENDENT_AMBULATORY_CARE_PROVIDER_SITE_OTHER): Payer: Medicare Other | Admitting: Podiatry

## 2017-02-14 DIAGNOSIS — I6521 Occlusion and stenosis of right carotid artery: Secondary | ICD-10-CM

## 2017-02-14 DIAGNOSIS — L97521 Non-pressure chronic ulcer of other part of left foot limited to breakdown of skin: Secondary | ICD-10-CM

## 2017-02-14 NOTE — Progress Notes (Signed)
Subjective: [name] presents the office that her son for evaluation of her left third toe. Her son states that she'll be going on vacation the next 2 weeks ago where to see her sister and he wants to get the toe checked before she leaves. She has notany swelling or redness or any drainage coming from the area. She continues to wear toe separator between her third and fourth toes. She has no other concerns today. Denies any systemic complaints such as fevers, chills, nausea, vomiting. No acute changes since last appointment, and no other complaints at this time.   Objective: AAO x3, NAD DP/PT pulses palpable bilaterally, CRT less than 3 seconds There is bony prominence off of the lateral aspect of the third digit. Along the lateral PIPJ of the third toes a superficial wound measuring 0.3 x 0.3 cm and is superficial without any probing, undermining or tunneling. There is no surrounding erythema, ascending cellulitis. There is minimal hyperkeratotic tissue around the area. There is no probing, undermining or tunneling. There is no clinical signs of infection present. No open lesions or pre-ulcerative lesions.  No pain with calf compression, swelling, warmth, erythema  Assessment: Recurrent noninfected wound left third toe  Plan: -All treatment options discussed with the patient including all alternatives, risks, complications.  -Patient reports that the area will get "wet" in between her toes. At this point I recommended a small amount of Betadine and a bandage to the wound daily. Continue toe separators I did dispense further tests of biters today. She's also changes shoes which is been helping to help take pressure off the toes. -Monitor for any clinical signs or symptoms of infection and directed to call the office immediately should any occur or go to the ER. -Follow up once she returns from her trip or sooner if any issues are to arise. -Patient encouraged to call the office with any  questions, concerns, change in symptoms.   Ovid CurdMatthew Wagoner, DPM

## 2017-03-07 ENCOUNTER — Ambulatory Visit: Payer: Medicare Other | Admitting: Podiatry

## 2017-03-23 ENCOUNTER — Other Ambulatory Visit: Payer: Medicare Other

## 2017-03-25 ENCOUNTER — Ambulatory Visit (INDEPENDENT_AMBULATORY_CARE_PROVIDER_SITE_OTHER): Payer: Medicare Other | Admitting: Cardiovascular Disease

## 2017-03-25 VITALS — BP 120/58 | HR 78 | Ht 64.0 in | Wt 162.8 lb

## 2017-03-25 DIAGNOSIS — I6521 Occlusion and stenosis of right carotid artery: Secondary | ICD-10-CM

## 2017-03-25 DIAGNOSIS — I739 Peripheral vascular disease, unspecified: Secondary | ICD-10-CM

## 2017-03-25 DIAGNOSIS — I2510 Atherosclerotic heart disease of native coronary artery without angina pectoris: Secondary | ICD-10-CM | POA: Diagnosis not present

## 2017-03-25 DIAGNOSIS — I10 Essential (primary) hypertension: Secondary | ICD-10-CM

## 2017-03-25 DIAGNOSIS — I779 Disorder of arteries and arterioles, unspecified: Secondary | ICD-10-CM

## 2017-03-25 DIAGNOSIS — E785 Hyperlipidemia, unspecified: Secondary | ICD-10-CM

## 2017-03-25 MED ORDER — ASPIRIN EC 81 MG PO TBEC: 81.0000 mg | DELAYED_RELEASE_TABLET | Freq: Every day | ORAL | 3 refills | Status: AC

## 2017-03-25 NOTE — Progress Notes (Signed)
Cardiology Office Note   Date:  03/25/2017   ID:  Natasha Hernandez, DOB 12/14/1940, MRN [id number]  PCP:  Deon Pillingixon, Mary B, PA-C  Cardiologist:  Kirke CorinArida  Chief Complaint  Patient presents with  . Other    2 month follow up. Patient denies chest pain and SOB. Meds reviewed verbally with patient.       History of Present Illness: Natasha Hernandez is a 76 y.o. female who is here today for a follow-up visit. She moved last year from [place] to be close to her family. She has extensive medical problems that include coronary artery disease status [facility], type 1 diabetes since she was 76 years old, hypertension, hyperlipidemia, peripheral arterial disease and carotid artery disease. Most recent cardiac catheterization was done in 2013 which showed patent grafts including SVG to OM, SVG to diagonal, SVG to PDA and LIMA to LAD. She is known to have occluded right carotid artery . She is a lifelong nonsmoker. She is known to have peripheral arterial disease with no significant claudication. Noninvasive vascular evaluation in August 2017 showed an ABI of 0.85 on the right and 1.1 on the left. Duplex showed no obstructive aortoiliac or SFA disease. There was one-vessel runoff below the knee bilaterally.  She has been doing extremely well with no chest pain, shortness of breath or palpitations.  No side effects with medications.  Past Medical History:  Diagnosis Date  . Carotid artery occlusion   . Coronary artery disease    CABG in 1996. Most recent cardiac catheterization in 2013 showed patent grafts including LIMA to LAD, SVG to OM, SVG to diagonal and SVG to PDA.  . Diabetes mellitus without complication (HCC)   . Hyperlipidemia   . Hypertension   . PAD (peripheral artery disease) (HCC)     Past Surgical History:  Procedure Laterality Date  . CORONARY ARTERY BYPASS GRAFT  1996     Current Outpatient Medications  Medication Sig Dispense Refill  .  aspirin EC 81 MG tablet Take 1 tablet (81 mg total) by mouth daily. 90 tablet 3  . Calcium Carbonate (CALCIUM 600 PO) Take 600-1,200 tablets by mouth 2 (two) times daily. 1 tablet in the morning , and 1 tablet in the evenung    . carvedilol (COREG) 6.25 MG tablet Take 1 tablet (6.25 mg total) by mouth 2 (two) times daily with a meal. 180 tablet 3  . Coenzyme Q10 (CO Q 10 PO) Take 1 capsule by mouth daily.    . Continuous Blood Gluc Receiver (FREESTYLE LIBRE READER) DEVI 1 Device by Does not apply route every 30 (thirty) days. 3 Device 0  . Continuous Blood Gluc Sensor (FREESTYLE LIBRE SENSOR SYSTEM) MISC Inject 3 Devices into the skin every 14 (fourteen) days. Dx code- E10.65 NDC- [id number] 3 each 2  . insulin aspart (NOVOLOG) 100 UNIT/ML injection Inject 2-15 Units into the skin 3 (three) times daily before meals. Sliding scale: 0-150 equals 2 units, increase 1 units for every 50 blood sugar reading after 150. Formula for carbs is number of carbs divided by 5.    . insulin degludec (TRESIBA FLEXTOUCH) 100 UNIT/ML SOPN FlexTouch Pen Inject 11 units daily 4 pen 3  . isosorbide mononitrate (IMDUR) 60 MG 24 hr tablet Take 0.5 tablets (30 mg total) by mouth every morning. TAke 1 tablet by mouth every evening 30 tablet 3  . lisinopril (PRINIVIL,ZESTRIL) 20 MG tablet TAKE ONE TABLET BY MOUTH DAILY  90 tablet 1  . Multiple Vitamin (MULTIVITAMIN) tablet Take 1 tablet by mouth daily.    . nitroGLYCERIN (NITROLINGUAL) 0.4 MG/SPRAY spray Place 1 spray under the tongue every 5 (five) minutes x 3 doses as needed for chest pain (Call 911 if taking 3rd spray). 4.9 g 1  . rosuvastatin (CRESTOR) 20 MG tablet TAKE 1 TABLET BY MOUTH EVERY DAY 90 tablet 2   No current facility-administered medications for this visit.     Allergies:   Patient has no known allergies.    Social History:  The patient  reports that  has never smoked. she has never used smokeless tobacco. She reports that she does not drink alcohol.    Family History:  The patient's Family history is negative for coronary artery disease.   ROS:  Please see the history of present illness.   Otherwise, review of systems are positive for none.   All other systems are reviewed and negative.    PHYSICAL EXAM: VS:  BP (!) 120/58 (BP Location: Right Arm, Patient Position: Sitting, Cuff Size: Normal)   Pulse 78   Ht 5\' 4"  (1.626 m)   Wt 162 lb 12 oz (73.8 kg)   BMI 27.94 kg/m  , BMI Body mass index is 27.94 kg/m. GEN: Well nourished, well developed, in no acute distress  HEENT: normal  Neck: no JVD, carotid bruits, or masses Cardiac: RRR; no murmurs, rubs, or gallops,no edema  Respiratory:  clear to auscultation bilaterally, normal work of breathing GI: soft, nontender, nondistended, + BS MS: no deformity or atrophy  Skin: warm and dry, no rash Neuro:  Strength and sensation are intact Psych: euthymic mood, full affect  EKG:  EKG is ordered today. Normal sinus rhythm with no significant ST or T wave changes.  Recent Labs: 05/04/2016: TSH 2.53 07/05/2016: ALT 18 01/31/2017: BUN 24; Creatinine, Ser 0.87; Potassium 4.7; Sodium 138    Lipid Panel    Component Value Date/Time   CHOL 202 (H) 07/05/2016 0817   TRIG 36.0 07/05/2016 0817   HDL 108.70 07/05/2016 0817   CHOLHDL 2 07/05/2016 0817   VLDL 7.2 07/05/2016 0817   LDLCALC 86 07/05/2016 0817      Wt Readings from Last 3 Encounters:  03/25/17 162 lb 12 oz (73.8 kg)  02/04/17 161 lb 2 oz (73.1 kg)  11/12/16 160 lb 6.4 oz (72.8 kg)       No flowsheet data found.    ASSESSMENT AND PLAN:  1.  Coronary artery disease involving native coronary arteries without angina:  Most recent cardiac catheterization in 2013 showed patent grafts.  She is doing extremely well.  Continue medical therapy.  I change aspirin to 81 mg once daily.  2. Peripheral arterial disease: Currently with no significant claudication and no evidence of critical limb ischemia. Continue medical  therapy.   3. 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   Most recent carotid Doppler in October of this year showed chronically occluded right coronary artery with mild nonobstructive disease on the left.  Repeat study in October 2019.  4. Hyperlipidemia: Continue treatment with rosuvastatin.  Most recent LDL was 86 but I suspect that it is probably better now given that she was switched around that time to rosuvastatin.  5. Essential hypertension: Blood pressure is well controlled on current medications.    Disposition:   FU with me in 6 months  Signed,  Lorine BearsMuhammad Helem Reesor, MD  03/25/2017 9:08 AM    [facility]

## 2017-03-25 NOTE — Patient Instructions (Signed)
Medication Instructions:  Your physician has recommended you make the following change in your medication:  DECREASE aspirin to 81mg  once daily    Labwork: none  Testing/Procedures: nonde  Follow-Up: Your physician wants you to follow-up in: 6 months with Dr. Kirke CorinArida.  You will receive a reminder letter in the mail two months in advance. If you don't receive a letter, please call our office to schedule the follow-up appointment.   Any Other Special Instructions Will Be Listed Below (If Applicable).     If you need a refill on your cardiac medications before your next appointment, please call your pharmacy.

## 2017-03-26 ENCOUNTER — Other Ambulatory Visit: Payer: Self-pay | Admitting: Cardiovascular Disease

## 2017-03-30 ENCOUNTER — Other Ambulatory Visit: Payer: Self-pay

## 2017-03-30 MED ORDER — FREESTYLE LIBRE READER DEVI: 1.0000 | 0 refills | Status: DC

## 2017-04-05 ENCOUNTER — Telehealth: Payer: Self-pay | Admitting: Podiatry

## 2017-04-05 NOTE — Telephone Encounter (Signed)
pts daughter returned my call and is coming by this week to pick up diabetic shoes for her mom. Ok for her to pick up per [employer]..Marland Kitchen

## 2017-05-02 ENCOUNTER — Ambulatory Visit: Payer: Medicare Other | Admitting: Podiatry

## 2017-05-04 ENCOUNTER — Telehealth: Payer: Self-pay | Admitting: Endocrinology

## 2017-05-04 ENCOUNTER — Other Ambulatory Visit: Payer: Self-pay

## 2017-05-04 MED ORDER — INSULIN ASPART 100 UNIT/ML ~~LOC~~ SOLN: 2.0000 [IU] | Freq: Three times a day (TID) | SUBCUTANEOUS | 4 refills | Status: DC

## 2017-05-04 NOTE — Telephone Encounter (Signed)
Tried to send this escribed but would only print so I placed on doctors desk for signature

## 2017-05-04 NOTE — Telephone Encounter (Signed)
Patients [facility]

## 2017-05-06 ENCOUNTER — Ambulatory Visit (INDEPENDENT_AMBULATORY_CARE_PROVIDER_SITE_OTHER): Payer: Medicare Other | Admitting: Podiatry

## 2017-05-06 DIAGNOSIS — M79676 Pain in unspecified toe(s): Secondary | ICD-10-CM

## 2017-05-06 DIAGNOSIS — M79675 Pain in left toe(s): Secondary | ICD-10-CM

## 2017-05-06 DIAGNOSIS — E1049 Type 1 diabetes mellitus with other diabetic neurological complication: Secondary | ICD-10-CM | POA: Diagnosis not present

## 2017-05-06 DIAGNOSIS — B351 Tinea unguium: Secondary | ICD-10-CM

## 2017-05-09 NOTE — Progress Notes (Signed)
Subjective: 77 y.o. returns the office today for painful, elongated, thickened toenails which she cannot trim herself. Denies any redness or drainage around the nails.  She also presents for follow-up evaluation of possible corn to her third toe on the left foot.  Denies any open sores and denies any pain otherwise.  She also presents to pick up a new pair of diabetic shoes.  Denies any acute changes since last appointment and no new complaints today. Denies any systemic complaints such as fevers, chills, nausea, vomiting.   PCP: Dorena Bodoixon, Mary B, PA-C  Objective: AAO 3, NAD DP/PT pulses palpable, CRT less than 3 seconds Nails hypertrophic, dystrophic, elongated, brittle, discolored 10. There is tenderness overlying the nails 1-5 bilaterally. There is no surrounding erythema or drainage along the nail sites. Along the third digit along the area of the previous wound site appears to be healed and there is no significant hyperkeratotic tissue identified.  There is a bony prominence to the area however. No open lesions or pre-ulcerative lesions are identified. No other areas of tenderness bilateral lower extremities. No overlying edema, erythema, increased warmth. No pain with calf compression, swelling, warmth, erythema.  Assessment: Patient presents with symptomatic onychomycosis  Plan: -Treatment options including alternatives, risks, complications were discussed -Nails sharply debrided 10 without complication/bleeding. -No recurrent ulceration noted today but I do want her to use a toe separator to help take pressure off the area to help prevent reoccurrence. -Diabetic shoes were dispensed today.  Break instructions were discussed.  They appear to be fitting well without any complications. -Discussed daily foot inspection. If there are any changes, to call the office immediately.  -Follow-up in 9 weeks or sooner if any problems are to arise. In the meantime, encouraged to call the office with  any questions, concerns, changes symptoms.  Ovid CurdMatthew Debany Vantol, DPM

## 2017-05-11 NOTE — Telephone Encounter (Signed)
Spoke with daughter today and she will call me back if patient did not receive the prescription

## 2017-05-23 ENCOUNTER — Telehealth: Payer: Self-pay | Admitting: Endocrinology

## 2017-05-23 MED ORDER — INSULIN ASPART 100 UNIT/ML FLEXPEN: PEN_INJECTOR | SUBCUTANEOUS | 3 refills | Status: DC

## 2017-05-23 NOTE — Telephone Encounter (Signed)
The wrong script was sent in. Patient needs script for Novalog Flex Pen sent to [facility] [place] (script that was wrong was for Novalog only)

## 2017-05-23 NOTE — Telephone Encounter (Signed)
I have the new prescription for the Novolog Flexpen and has to be signed by Dr. Lucianne MussKumar and will fax in to them once signed.

## 2017-05-23 NOTE — Telephone Encounter (Signed)
This [facility] has been [facility] for the [facility]. Called patient and let her know the correct prescription has been sent to her pharmacy.

## 2017-06-06 ENCOUNTER — Other Ambulatory Visit (INDEPENDENT_AMBULATORY_CARE_PROVIDER_SITE_OTHER): Payer: Medicare Other

## 2017-06-06 DIAGNOSIS — E1065 Type 1 diabetes mellitus with hyperglycemia: Secondary | ICD-10-CM

## 2017-06-06 LAB — COMPREHENSIVE METABOLIC PANEL
ALK PHOS: 87 U/L (ref 39–117)
ALT: 18 U/L (ref 0–35)
AST: 19 U/L (ref 0–37)
Albumin: 4.2 g/dL (ref 3.5–5.2)
BUN: 26 mg/dL — ABNORMAL HIGH (ref 6–23)
CO2: 32 meq/L (ref 19–32)
Calcium: 9.3 mg/dL (ref 8.4–10.5)
Chloride: 102 mEq/L (ref 96–112)
Creatinine, Ser: 0.83 mg/dL (ref 0.40–1.20)
GFR: 71.19 mL/min (ref >60.00)
GLUCOSE: 115 mg/dL — AB (ref 70–99)
POTASSIUM: 4.7 meq/L (ref 3.5–5.1)
Sodium: 139 mEq/L (ref 135–145)
Total Bilirubin: 0.4 mg/dL (ref 0.2–1.2)
Total Protein: 6.5 g/dL (ref 6.0–8.3)

## 2017-06-06 LAB — HEMOGLOBIN A1C: Hgb A1c MFr Bld: 7.2 % — ABNORMAL HIGH (ref 4.6–6.5)

## 2017-06-06 LAB — MICROALBUMIN / CREATININE URINE RATIO
CREATININE, U: 65.4 mg/dL
MICROALB/CREAT RATIO: 1.1 mg/g (ref 0.0–30.0)

## 2017-06-06 LAB — LIPID PANEL
CHOLESTEROL: 194 mg/dL (ref 0–200)
HDL: 83.1 mg/dL (ref >39.00)
LDL Cholesterol: 97 mg/dL (ref 0–99)
NonHDL: 110.69
TRIGLYCERIDES: 67 mg/dL (ref 0.0–149.0)
Total CHOL/HDL Ratio: 2
VLDL: 13.4 mg/dL (ref 0.0–40.0)

## 2017-06-10 ENCOUNTER — Ambulatory Visit (INDEPENDENT_AMBULATORY_CARE_PROVIDER_SITE_OTHER): Payer: Medicare Other | Admitting: Endocrinology

## 2017-06-10 ENCOUNTER — Encounter: Payer: Self-pay | Admitting: Endocrinology

## 2017-06-10 VITALS — BP 109/76 | HR 81 | Ht 64.0 in | Wt 162.8 lb

## 2017-06-10 DIAGNOSIS — E7800 Pure hypercholesterolemia, unspecified: Secondary | ICD-10-CM

## 2017-06-10 DIAGNOSIS — E1042 Type 1 diabetes mellitus with diabetic polyneuropathy: Secondary | ICD-10-CM

## 2017-06-10 DIAGNOSIS — E1065 Type 1 diabetes mellitus with hyperglycemia: Secondary | ICD-10-CM

## 2017-06-10 MED ORDER — ISOSORBIDE MONONITRATE ER 60 MG PO TB24: 30.0000 mg | ORAL_TABLET | ORAL | 1 refills | Status: DC

## 2017-06-10 MED ORDER — ROSUVASTATIN CALCIUM 40 MG PO TABS: 40.0000 mg | ORAL_TABLET | Freq: Every day | ORAL | 3 refills | Status: DC

## 2017-06-10 NOTE — Patient Instructions (Signed)
Crestor 40 mg

## 2017-06-10 NOTE — Progress Notes (Signed)
Patient ID: Natasha Hernandez, female   DOB: 05/04/1940, 77 y.o.   MRN: [id number]            Reason for Appointment : f/u for Type 1 Diabetes  History of Present Illness          Diagnosis: Type 1 diabetes mellitus, date of diagnosis: 1953          Previous history:  She thinks she has been on various insulin regimens over the years but has been on Lantus for 10-12 years along with mealtime insulin Previously followed by an endocrinologist but no records available. Her A1c has usually been around 7% reportedly and was 7.2 in 7/16   Recent history:   Her A1c is excellent at 7.2, previously 7.5    INSULIN regimen is: [place] 11 units daily, NOVOLOG carbohydrate coverage 1:5 and 1: 50 correction factor at meals  Current management, blood sugar patterns and problems identified:     She is recently trying to cut back on her carbohydrates to lose weight  However she is still taking about the same amount of insulin for meal coverage: Usually 2 units at breakfast, 3-4 units at lunch and dinner  Occasionally may take an extra unit for eating dessert or larger meal and not clear if she is doing a correction for high readings  Her glucose recently throughout the day is excellent with an average at any given time ranging from 123 up to 160 as seen on her FreeStyle download  Variability is also significantly less  She has the most variability in the evenings after supper and some late afternoon  OVERNIGHT blood sugars on trending to be higher but blood sugars are usually improved on waking up and as low as 102  She has not changed her Evaristo Bury since last time  HIGHEST blood sugars are late at night again and some of the high readings are related to excessive rebound  MEALTIME insulin again is usually taken this before eating but may be right after also sometimes  She thinks her FreeStyle sensor is generally accurate  HYPOGLYCEMIA has been recently minimal  She had occasional low  sugars, once from overestimating her carbohydrate intake when eating [place] food and blood sugar was low when she came home, otherwise usually does not have low sugars that are significant  Review of her treatment of hypoglycemia indicates that she is mostly eating food and not fast acting carbohydrates.  Mostly eating peanut butter or other foods and then she will have significant rebound for a few hours  She is now trying to exercise on her bike and this may be done before suppertime and she thinks sometime this she'll bring her sugar down to much  Glucose monitoring:  is being done several times a day         Glucometer: Freestyle libre  Blood Glucose readings and analysis as below from [employer] for the 2 weeks until today  CGM use % 89   Average and SD 143+/-38   Time in range 81   % Time Above 180 17   % Time above 250   % Time Below target 2       PRE-MEAL Fasting Lunch Dinner Bedtime Overall  Glucose range:       Mean/median: 140  146  123   143    POST-MEAL PC Breakfast PC Lunch PC Dinner  Glucose range:     Mean/median:  155  142    Factors causing  Hypoglycemia: Overestimating mealtime coverage, exercise  Symptoms of hypoglycemia: Some weakness, confusion at times Treatment of hypoglycemia: Corn syrup or jellybeans/other sweets.  She will get 15 g of carbohydrate  Self-care: The diet that the patient has been following is: Carbohydrate counting  Mealtimes are: Breakfast AT 8.30 AM Dinner: 7.30   For breakfast will have toast with cheese or egg, sometimes cereal and egg Periodically eating out         Exercise:  mostly exercise bike          Dietician consultation: Most recent: Years ago .         CDE consultation: 04/28/16    Wt Readings from Last 3 Encounters:  06/10/17 162 lb 12.8 oz (73.8 kg)  03/25/17 162 lb 12 oz (73.8 kg)  02/04/17 161 lb 2 oz (73.1 kg)    Lab Results  Component Value Date   HGBA1C 7.2 (H) 06/06/2017   HGBA1C 7.5 (H)  01/31/2017   HGBA1C 7.6 (H) 11/08/2016   Lab Results  Component Value Date   MICROALBUR <0.7 06/06/2017   LDLCALC 97 06/06/2017   CREATININE 0.83 06/06/2017    Lab Results  Component Value Date   MICRALBCREAT 1.1 06/06/2017   MICRALBCREAT 0.8 05/04/2016     Allergies as of 06/10/2017   No Known Allergies     Medication List        Accurate as of 06/10/17  9:33 PM. Always use your most recent med list.          aspirin EC 81 MG tablet Take 1 tablet (81 mg total) by mouth daily.   CALCIUM 600 PO Take 600-1,200 tablets by mouth 2 (two) times daily. 1 tablet in the morning , and 1 tablet in the evenung   carvedilol 6.25 MG tablet Commonly known as:  COREG TAKE ONE TABLET BY MOUTH TWICE DAILY WITH A MEAL   CO Q 10 PO Take 1 capsule by mouth daily.   FREESTYLE LIBRE READER Devi 1 Device by Does not apply route every 30 (thirty) days.   FREESTYLE LIBRE SENSOR SYSTEM Misc Inject 3 Devices into the skin every 14 (fourteen) days. Dx code- E10.65 NDC- [id number]   insulin aspart 100 UNIT/ML injection Commonly known as:  novoLOG Inject 2-15 Units into the skin 3 (three) times daily before meals. Sliding scale: 0-150 equals 2 units, increase 1 units for every 50 blood sugar reading after 150. Formula for carbs is number of carbs divided by 5.   insulin aspart 100 UNIT/ML FlexPen Commonly known as:  NOVOLOG FLEXPEN Inject 2-15 Units into the skin 3 (three) times daily before meals. Sliding scale: 0-150 equals 2 units, increase 1 units for every 50 blood sugar reading after 150. Formula for carbs is number of carbs divided by 5.   insulin degludec 100 UNIT/ML Sopn FlexTouch Pen Commonly known as:  TRESIBA FLEXTOUCH Inject 11 units daily   isosorbide mononitrate 60 MG 24 hr tablet Commonly known as:  IMDUR Take 0.5 tablets (30 mg total) by mouth every morning. TAke 1 tablet by mouth every evening   lisinopril 20 MG tablet Commonly known as:  PRINIVIL,ZESTRIL TAKE ONE  TABLET BY MOUTH DAILY   multivitamin tablet Take 1 tablet by mouth daily.   nitroGLYCERIN 0.4 MG/SPRAY spray Commonly known as:  NITROLINGUAL Place 1 spray under the tongue every 5 (five) minutes x 3 doses as needed for chest pain (Call 911 if taking 3rd spray).   rosuvastatin 20 MG tablet Commonly known as:  CRESTOR TAKE 1 TABLET BY MOUTH EVERY DAY       Allergies: No Known Allergies  Past Medical History:  Diagnosis Date  . Carotid artery occlusion   . Coronary artery disease    CABG in 1996. Most recent cardiac catheterization in 2013 showed patent grafts including LIMA to LAD, SVG to OM, SVG to diagonal and SVG to PDA.  . Diabetes mellitus without complication (HCC)   . Hyperlipidemia   . Hypertension   . PAD (peripheral artery disease) (HCC)     Past Surgical History:  Procedure Laterality Date  . CORONARY ARTERY BYPASS GRAFT  1996    No family history on file.  Social History:  reports that  has never smoked. she has never used smokeless tobacco. She reports that she does not drink alcohol. Her drug history is not on file.      Review of Systems   Lipids:  She is on Crestor 20 mg daily  Has history of CAD in the past several years ago with coronary bypass Has not LDL below 70 and slightly higher recently  Lab Results  Component Value Date   CHOL 194 06/06/2017   HDL 83.10 06/06/2017   LDLCALC 97 06/06/2017   TRIG 67.0 06/06/2017   CHOLHDL 2 06/06/2017    Has had proliferative retinopathy, report of last exam not available  Neuropathy with some sensory loss: Forms filled out for diabetic shoes in 2018  LABS:  Lab on 06/06/2017  Component Date Value Ref Range Status  . Cholesterol 06/06/2017 194  0 - 200 mg/dL Final   ATP III Classification       Desirable:  < 200 mg/dL               Borderline High:  200 - 239 mg/dL          High:  > = 191240 mg/dL  . Triglycerides 06/06/2017 67.0  0.0 - 149.0 mg/dL Final   Normal:  <478<150 mg/dLBorderline High:   150 - 199 mg/dL  . HDL 06/06/2017 83.10  >39.00 mg/dL Final  . VLDL 29/56/213002/18/2019 13.4  0.0 - 40.0 mg/dL Final  . LDL Cholesterol 06/06/2017 97  0 - 99 mg/dL Final  . Total CHOL/HDL Ratio 06/06/2017 2   Final                  Men          Women1/2 Average Risk     3.4          3.3Average Risk          5.0          4.42X Average Risk          9.6          7.13X Average Risk          15.0          11.0                      . NonHDL 06/06/2017 110.69   Final   NOTE:  Non-HDL goal should be 30 mg/dL higher than patient's LDL goal (i.e. LDL goal of < 70 mg/dL, would have non-HDL goal of < 100 mg/dL)  . Microalb, Ur 06/06/2017 <0.7  0.0 - 1.9 mg/dL Final  . Creatinine,U 86/57/846902/18/2019 65.4  mg/dL Final  . Microalb Creat Ratio 06/06/2017 1.1  0.0 - 30.0 mg/g Final  . Sodium 06/06/2017 139  135 - 145 mEq/L  Final  . Potassium 06/06/2017 4.7  3.5 - 5.1 mEq/L Final  . Chloride 06/06/2017 102  96 - 112 mEq/L Final  . CO2 06/06/2017 32  19 - 32 mEq/L Final  . Glucose, Bld 06/06/2017 115* 70 - 99 mg/dL Final  . BUN 16/01/9603 26* 6 - 23 mg/dL Final  . Creatinine, Ser 06/06/2017 0.83  0.40 - 1.20 mg/dL Final  . Total Bilirubin 06/06/2017 0.4  0.2 - 1.2 mg/dL Final  . Alkaline Phosphatase 06/06/2017 87  39 - 117 U/L Final  . AST 06/06/2017 19  0 - 37 U/L Final  . ALT 06/06/2017 18  0 - 35 U/L Final  . Total Protein 06/06/2017 6.5  6.0 - 8.3 g/dL Final  . Albumin 54/12/8117 4.2  3.5 - 5.2 g/dL Final  . Calcium 14/78/2956 9.3  8.4 - 10.5 mg/dL Final  . GFR 21/30/8657 71.19  >60.00 mL/min Final  . Hgb A1c MFr Bld 06/06/2017 7.2* 4.6 - 6.5 % Final   Glycemic Control Guidelines for People with Diabetes:Non Diabetic:  <6%Goal of Therapy: <7%Additional Action Suggested:  >8%     Physical Examination:  BP 109/76   Pulse 81   Ht 5\' 4"  (1.626 m)   Wt 162 lb 12.8 oz (73.8 kg)   BMI 27.94 kg/m      ASSESSMENT:  Diabetes type 1, long-standing with overall fairly good control consistently  A1c is now 7.2  without excessive hyperglycemia  See history of present illness for detailed discussion of current diabetes management, blood sugar patterns and problems identified  She has had relatively stable blood sugars with using [place] with not excessive variability  Discussed above she has highest readings late at night and sometimes overnight and not clear why, not eating excessive snacks She may not always be able to get consistent control with postprandial readings in the evening but only once has had significant hyperglycemia with eating out She is concerned about difficulty losing weight but discussed that she is not having excessive insulin nor hypoglycemia and is already reducing carbohydrate She does need to exercise more to provide this weight loss She is overtreating hypoglycemia with foods and not rapidly acting carbohydrates and this was discussed in detail  Discussed day-to-day diabetes and insulin management for mealtime coverage, blood sugar patterns, avoiding hypoglycemia and management of exercise and hypoglycemia  LIPIDS: Not adequately controlled with history of CAD and she will go to 40 mg Crestor  Hypertension: Well controlled with lisinopril 20 mg   Microalbumin normal  PLAN:    She will continue taking 11 units Tresiba  To try and take NovoLog before eating as much is possible  Since her blood sugars are relatively well controlled she should not try to overachieve tight controlled  Discussed how to avoid rebound hyperglycemia  She will continue same carbohydrate coverage but try to estimate carbohydrate counts better with resources available  Have rapid acting snack before going for exercise on his blood sugar is over 150   Patient Instructions  Crestor 40mg    Counseling time on subjects discussed in assessment and plan sections is over 50% of today's 25 minute visit   Reather Littler 06/10/2017, 9:33 PM   Note: This note was prepared with Dragon voice  recognition system technology. Any transcriptional errors that result from this process are unintentional.

## 2017-06-20 ENCOUNTER — Other Ambulatory Visit: Payer: Self-pay | Admitting: Endocrinology

## 2017-07-08 ENCOUNTER — Ambulatory Visit (INDEPENDENT_AMBULATORY_CARE_PROVIDER_SITE_OTHER): Payer: Medicare Other | Admitting: Podiatry

## 2017-07-08 ENCOUNTER — Encounter: Payer: Self-pay | Admitting: Podiatry

## 2017-07-08 DIAGNOSIS — B351 Tinea unguium: Secondary | ICD-10-CM | POA: Diagnosis not present

## 2017-07-08 DIAGNOSIS — M79675 Pain in left toe(s): Secondary | ICD-10-CM

## 2017-07-08 DIAGNOSIS — L84 Corns and callosities: Secondary | ICD-10-CM

## 2017-07-11 NOTE — Progress Notes (Signed)
Subjective: 77 y.o. returns the office today for painful, elongated, thickened toenails which she cannot trim herself. Denies any redness or drainage around the nails.  She also has a corn to her third toe on the left foot.  Denies any open sores. Denies any acute changes since last appointment and no new complaints today. Denies any systemic complaints such as fevers, chills, nausea, vomiting.   PCP: Dorena Bodoixon, Mary B, PA-C  Objective: AAO 3, NAD DP/PT pulses palpable, CRT less than 3 seconds Nails hypertrophic, dystrophic, elongated, brittle, discolored 10. There is tenderness overlying the nails 1-5 bilaterally. There is no surrounding erythema or drainage along the nail sites. Along the third digit along the area of the previous wound site appears to be healed and there is no significant hyperkeratotic tissue identified.  There is a bony prominence to the area however. Hammertoes present.  No open lesions or pre-ulcerative lesions are identified. No other areas of tenderness bilateral lower extremities. No overlying edema, erythema, increased warmth. No pain with calf compression, swelling, warmth, erythema.  Assessment: Patient presents with symptomatic onychomycosis  Plan: -Treatment options including alternatives, risks, complications were discussed -Nails sharply debrided 10 without complication/bleeding. -Hyperkeratotic lesion sharply debrided x 1 without complications or bleeding.  -Discussed daily foot inspection. If there are any changes, to call the office immediately.  -Follow-up in 9 weeks or sooner if any problems are to arise. In the meantime, encouraged to call the office with any questions, concerns, changes symptoms.  Ovid CurdMatthew Dillen Belmontes, DPM

## 2017-07-15 DIAGNOSIS — M4316 Spondylolisthesis, lumbar region: Secondary | ICD-10-CM | POA: Diagnosis not present

## 2017-07-15 DIAGNOSIS — M545 Low back pain: Secondary | ICD-10-CM | POA: Diagnosis not present

## 2017-08-22 ENCOUNTER — Other Ambulatory Visit: Payer: Self-pay

## 2017-08-22 MED ORDER — LISINOPRIL 20 MG PO TABS: 20.0000 mg | ORAL_TABLET | Freq: Every day | ORAL | 3 refills | Status: DC

## 2017-09-05 ENCOUNTER — Other Ambulatory Visit (INDEPENDENT_AMBULATORY_CARE_PROVIDER_SITE_OTHER): Payer: Medicare Other

## 2017-09-05 DIAGNOSIS — E1065 Type 1 diabetes mellitus with hyperglycemia: Secondary | ICD-10-CM | POA: Diagnosis not present

## 2017-09-05 LAB — LIPID PANEL
CHOL/HDL RATIO: 2
Cholesterol: 199 mg/dL (ref 0–200)
HDL: 86.8 mg/dL (ref >39.00)
LDL CALC: 99 mg/dL (ref 0–99)
NonHDL: 112.26
TRIGLYCERIDES: 66 mg/dL (ref 0.0–149.0)
VLDL: 13.2 mg/dL (ref 0.0–40.0)

## 2017-09-05 LAB — COMPREHENSIVE METABOLIC PANEL
ALBUMIN: 3.8 g/dL (ref 3.5–5.2)
ALT: 18 U/L (ref 0–35)
AST: 18 U/L (ref 0–37)
Alkaline Phosphatase: 75 U/L (ref 39–117)
BUN: 30 mg/dL — ABNORMAL HIGH (ref 6–23)
CALCIUM: 8.8 mg/dL (ref 8.4–10.5)
CO2: 29 mEq/L (ref 19–32)
Chloride: 104 mEq/L (ref 96–112)
Creatinine, Ser: 0.88 mg/dL (ref 0.40–1.20)
GFR: 66.5 mL/min (ref >60.00)
Glucose, Bld: 209 mg/dL — ABNORMAL HIGH (ref 70–99)
POTASSIUM: 4.7 meq/L (ref 3.5–5.1)
Sodium: 139 mEq/L (ref 135–145)
TOTAL PROTEIN: 6.3 g/dL (ref 6.0–8.3)
Total Bilirubin: 0.3 mg/dL (ref 0.2–1.2)

## 2017-09-05 LAB — HEMOGLOBIN A1C: HEMOGLOBIN A1C: 7.3 % — AB (ref 4.6–6.5)

## 2017-09-06 ENCOUNTER — Telehealth: Payer: Self-pay | Admitting: Endocrinology

## 2017-09-06 NOTE — Telephone Encounter (Signed)
I called to reschedule patient (appt is 11/04/17)-Patient had labs done yesterday-would like you to call with lab results Patient concerned about her A1C number. Call Brandi at ph# [phone]

## 2017-09-06 NOTE — Telephone Encounter (Signed)
Patient called and left message detailing that labs would be reviewed by doctor and then released.

## 2017-09-09 ENCOUNTER — Ambulatory Visit (INDEPENDENT_AMBULATORY_CARE_PROVIDER_SITE_OTHER): Payer: Medicare Other | Admitting: Podiatry

## 2017-09-09 ENCOUNTER — Ambulatory Visit: Payer: Medicare Other | Admitting: Endocrinology

## 2017-09-09 ENCOUNTER — Telehealth: Payer: Self-pay | Admitting: Podiatry

## 2017-09-09 DIAGNOSIS — B351 Tinea unguium: Secondary | ICD-10-CM | POA: Diagnosis not present

## 2017-09-09 DIAGNOSIS — M79675 Pain in left toe(s): Secondary | ICD-10-CM | POA: Diagnosis not present

## 2017-09-09 DIAGNOSIS — L84 Corns and callosities: Secondary | ICD-10-CM

## 2017-09-09 NOTE — Telephone Encounter (Signed)
Natasha Hernandez - scheduler states they have found the medication and have a refill.

## 2017-09-09 NOTE — Telephone Encounter (Signed)
Pt mother called and stated that her mom Kevona needed an anti-biotic cream or ointment for the bottom of her foot. Dr. Ardelle Anton has previously prescribed it and Dr. Ardelle Anton asked he to call the office back if she didn't have any more of it at home.

## 2017-09-13 NOTE — Progress Notes (Signed)
Subjective: 77 y.o. returns the office today for painful, elongated, thickened toenails which she cannot trim herself as well as for callus. Denies any redness or drainage around the nails/callus. Denies any open sores. Denies any acute changes since last appointment and no new complaints today. Denies any systemic complaints such as fevers, chills, nausea, vomiting.   PCP: Dorena Bodo, PA-C  Objective: AAO 3, NAD DP/PT pulses palpable, CRT less than 3 seconds Nails hypertrophic, dystrophic, elongated, brittle, discolored 10. There is tenderness overlying the nails 1-5 bilaterally. There is no surrounding erythema or drainage along the nail sites. On the third digit along the area of the previous wound site appears to be healed and there is no significant hyperkeratotic tissue identified.  There is a bony prominence to the area however. Hammertoes present.  No open lesions or pre-ulcerative lesions are identified. No other areas of tenderness bilateral lower extremities. No overlying edema, erythema, increased warmth. No pain with calf compression, swelling, warmth, erythema.  Assessment: Patient presents with symptomatic onychomycosis  Plan: -Treatment options including alternatives, risks, complications were discussed -Nails sharply debrided 10 without complication/bleeding. -Hyperkeratotic lesion sharply debrided x 1 without complications or bleeding.  -Discussed daily foot inspection. If there are any changes, to call the office immediately.  -Follow-up in 9 weeks or sooner if any problems are to arise. In the meantime, encouraged to call the office with any questions, concerns, changes symptoms.  Ovid Curd, DPM

## 2017-09-23 ENCOUNTER — Other Ambulatory Visit: Payer: Self-pay

## 2017-09-23 ENCOUNTER — Encounter: Payer: Self-pay | Admitting: Podiatry

## 2017-09-23 ENCOUNTER — Ambulatory Visit (INDEPENDENT_AMBULATORY_CARE_PROVIDER_SITE_OTHER): Payer: Medicare Other | Admitting: Podiatry

## 2017-09-23 DIAGNOSIS — T148XXA Other injury of unspecified body region, initial encounter: Secondary | ICD-10-CM

## 2017-09-23 DIAGNOSIS — I739 Peripheral vascular disease, unspecified: Principal | ICD-10-CM

## 2017-09-23 DIAGNOSIS — L84 Corns and callosities: Secondary | ICD-10-CM

## 2017-09-23 DIAGNOSIS — I779 Disorder of arteries and arterioles, unspecified: Secondary | ICD-10-CM

## 2017-09-23 NOTE — Progress Notes (Signed)
Subjective: 77 year old female presents the office today for concerns and follow-up of a callus on her left foot.  She noticed a blood blister distal to this area and she was keeping the area bandaged up until couple days ago as the area has dried out.  She denies any redness or drainage currently she denies any swelling.  No pain.  No other concerns. Denies any systemic complaints such as fevers, chills, nausea, vomiting. No acute changes since last appointment, and no other complaints at this time.   Objective: AAO x3, NAD DP/PT pulses palpable bilaterally, CRT less than 3 seconds Hyperkeratotic lesion left foot submetatarsal 2 there is evidence of old hemorrhagic blister just proximal to this area.  Upon debridement there is no underlying ulceration, drainage or any signs of infection.  There is no surrounding erythema, ascending cellulitis.  There is no fluctuation or crepitation or any malodor. No open lesions or pre-ulcerative lesions.  No pain with calf compression, swelling, warmth, erythema  Assessment: Pre-ulcerative callus left foot  Plan: -All treatment options discussed with the patient including all alternatives, risks, complications.  -Lesion was sharply debrided to any complications or bleeding.  Offloading.  Moisturizing the area daily. -Follow-up as scheduled for routine care or sooner if needed. -Patient encouraged to call the office with any questions, concerns, change in symptoms.   Vivi BarrackMatthew R Wagoner DPM

## 2017-09-30 ENCOUNTER — Other Ambulatory Visit: Payer: Self-pay

## 2017-09-30 MED ORDER — ROSUVASTATIN CALCIUM 40 MG PO TABS: 40.0000 mg | ORAL_TABLET | Freq: Every day | ORAL | 0 refills | Status: DC

## 2017-11-03 NOTE — Progress Notes (Signed)
Patient ID: Natasha Hernandez, female   DOB: 06/08/1940, 77 y.o.   MRN: [id number]            Reason for Appointment : f/u for Type 1 Diabetes  History of Present Illness          Diagnosis: Type 1 diabetes mellitus, date of diagnosis: 1953          Previous history:  She thinks she has been on various insulin regimens over the years but has been on Lantus for 10-12 years along with mealtime insulin Previously followed by an endocrinologist but no records available. Her A1c has usually been around 7% reportedly and was 7.2 in 7/16   Recent history:   Her A1c is fairly stable at 7.3    INSULIN regimen is: [place]iba 11 units daily, NOVOLOG carbohydrate coverage 1:5 and 1: 50 correction factor at meals  Current management, blood sugar patterns and problems identified:     She appears to be having variability of her blood sugars with range of blood sugar between 40 and 321  August fasting blood sugars have been periodically as low as 40 and may be low normal at times without symptoms  Only today her blood sugar was significantly high fasting but will be brought in from hypoglycemia after dinner yesterday  She does have inconsistent amount of postprandial hyperglycemia after BREAKFAST and dinner especially with variability in her dinner  She sometimes finds it difficult to accurately estimate how much insulin she needs for coverage including occasional low sugars after eating  In the morning she is now more often eating CEREAL which may raise her blood sugar over 180 at times but then at times will be low normal or as low as 59 before lunch  This is even with trying to take her mealtime insulin about 20 minutes before eating  Most significant HYPOGLYCEMIA was early morning on 7/10; has 1% of her readings below 70 overall  She is treating HYPOGLYCEMIA with frequently with food and not simple sugars and appears to have low improvement in blood sugars and then rebound subsequently, I  discussed how her sugar behaved with her postprandial hypoglycemia evening yesterday followed by rebound  Her weight is down 2 pounds  She has the most variability in the evenings after supper and some late afternoon  OVERNIGHT blood sugars on trending to be higher but blood sugars are usually improved on waking up and as low as 102  She has not changed her Evaristo Buryresiba since last time  HIGHEST blood sugars are around midnight averaging 172  She is trying to exercise on her bike and she will have a snack if blood sugar is low normal before starting  Glucose monitoring:  is being done several times a day         Glucometer: Freestyle libre  Blood Glucose readings and analysis as below from [employer] download for the 2 weeks until today  CGM use % of time  97  Average and SD  156+/-55  Time in range      69 %  % Time Above 180  27  % Time above 250  6  % Time Below target  4     Mean values apply above for all meters except median for One Touch  PRE-MEAL Fasting Lunch Dinner Bedtime Overall  Glucose range:     171   Mean/median:  133  156  144   156   POST-MEAL PC Breakfast PC Lunch PC  Dinner  Glucose range:     Mean/median:  159  160  168    PREVIOUS readings:  PRE-MEAL Fasting Lunch Dinner Bedtime Overall  Glucose range:       Mean/median: 140  146  123   143    POST-MEAL PC Breakfast PC Lunch PC Dinner  Glucose range:     Mean/median:  155  142    Factors causing Hypoglycemia: Overestimating mealtime coverage, exercise  Symptoms of hypoglycemia: Some weakness, confusion at times Treatment of hypoglycemia: Corn syrup or jellybeans/other sweets.  She will get 15 g of carbohydrate  Self-care: The diet that the patient has been following is: Carbohydrate counting  Mealtimes are: Breakfast AT 8.30 AM Dinner: 7.30   For breakfast will have toast with cheese or egg, sometimes cereal and egg Periodically eating out         Exercise:  mostly exercise bike            Dietician consultation: Most recent: Years ago .         CDE consultation: 04/28/16    Wt Readings from Last 3 Encounters:  11/04/17 160 lb 12.8 oz (72.9 kg)  06/10/17 162 lb 12.8 oz (73.8 kg)  03/25/17 162 lb 12 oz (73.8 kg)    Lab Results  Component Value Date   HGBA1C 7.3 (H) 09/05/2017   HGBA1C 7.2 (H) 06/06/2017   HGBA1C 7.5 (H) 01/31/2017   Lab Results  Component Value Date   MICROALBUR <0.7 06/06/2017   LDLCALC 99 09/05/2017   CREATININE 0.88 09/05/2017    Lab Results  Component Value Date   MICRALBCREAT 1.1 06/06/2017   MICRALBCREAT 0.8 05/04/2016     Allergies as of 11/04/2017   No Known Allergies     Medication List        Accurate as of 11/04/17 11:59 PM. Always use your most recent med list.          aspirin EC 81 MG tablet Take 1 tablet (81 mg total) by mouth daily.   CALCIUM 600 PO Take 600-1,200 tablets by mouth 2 (two) times daily. 1 tablet in the morning , and 1 tablet in the evenung   carvedilol 6.25 MG tablet Commonly known as:  COREG TAKE ONE TABLET BY MOUTH TWICE DAILY WITH A MEAL   CO Q 10 PO Take 1 capsule by mouth daily.   ezetimibe 10 MG tablet Commonly known as:  ZETIA Take 1 tablet (10 mg total) by mouth daily.   FREESTYLE LIBRE READER Devi 1 Device by Does not apply route every 30 (thirty) days.   FREESTYLE LIBRE SENSOR SYSTEM Misc Inject 3 Devices into the skin every 14 (fourteen) days. Dx code- E10.65 NDC- [id number]   insulin aspart 100 UNIT/ML FlexPen Commonly known as:  NOVOLOG FLEXPEN Inject 2-15 Units into the skin 3 (three) times daily before meals. Sliding scale: 0-150 equals 2 units, increase 1 units for every 50 blood sugar reading after 150. Formula for carbs is number of carbs divided by 5.   isosorbide mononitrate 60 MG 24 hr tablet Commonly known as:  IMDUR Take 0.5 tablets (30 mg total) by mouth every morning. TAke 1 tablet by mouth every evening   lisinopril 20 MG tablet Commonly known as:   PRINIVIL,ZESTRIL Take 1 tablet (20 mg total) by mouth daily.   multivitamin tablet Take 1 tablet by mouth daily.   nitroGLYCERIN 0.4 MG/SPRAY spray Commonly known as:  NITROLINGUAL Place 1 spray under the tongue every 5 (five)  minutes x 3 doses as needed for chest pain (Call 911 if taking 3rd spray).   rosuvastatin 40 MG tablet Commonly known as:  CRESTOR Take 1 tablet (40 mg total) by mouth daily.   TRESIBA FLEXTOUCH 100 UNIT/ML Sopn FlexTouch Pen Generic drug:  insulin degludec INJECT 11 UNITS INTO THE SKIN DAILY.       Allergies: No Known Allergies  Past Medical History:  Diagnosis Date  . Carotid artery occlusion   . Coronary artery disease    CABG in 1996. Most recent cardiac catheterization in 2013 showed patent grafts including LIMA to LAD, SVG to OM, SVG to diagonal and SVG to PDA.  . Diabetes mellitus without complication (HCC)   . Hyperlipidemia   . Hypertension   . PAD (peripheral artery disease) (HCC)     Past Surgical History:  Procedure Laterality Date  . CORONARY ARTERY BYPASS GRAFT  1996    History reviewed. No pertinent family history.  Social History:  reports that she has never smoked. She has never used smokeless tobacco. She reports that she does not drink alcohol. Her drug history is not on file.      Review of Systems   Lipids:  She is on Crestor 40 mg daily  Has history of CAD in the past several years ago with coronary bypass Again does not have LDL below 70    Lab Results  Component Value Date   CHOL 199 09/05/2017   HDL 86.80 09/05/2017   LDLCALC 99 09/05/2017   TRIG 66.0 09/05/2017   CHOLHDL 2 09/05/2017    Has had proliferative retinopathy, report of last exam not available  Neuropathy with some sensory loss: Forms filled out for diabetic shoes in 2018  LABS:  No visits with results within 1 Week(s) from this visit.  Latest known visit with results is:  Lab on 09/05/2017  Component Date Value Ref Range Status  .  Cholesterol 09/05/2017 199  0 - 200 mg/dL Final   ATP III Classification       Desirable:  < 200 mg/dL               Borderline High:  200 - 239 mg/dL          High:  > = 161 mg/dL  . Triglycerides 09/05/2017 66.0  0.0 - 149.0 mg/dL Final   Normal:  <096 mg/dLBorderline High:  150 - 199 mg/dL  . HDL 09/05/2017 86.80  >39.00 mg/dL Final  . VLDL 04/54/0981 13.2  0.0 - 40.0 mg/dL Final  . LDL Cholesterol 09/05/2017 99  0 - 99 mg/dL Final  . Total CHOL/HDL Ratio 09/05/2017 2   Final                  Men          Women1/2 Average Risk     3.4          3.3Average Risk          5.0          4.42X Average Risk          9.6          7.13X Average Risk          15.0          11.0                      . NonHDL 09/05/2017 112.26   Final   NOTE:  Non-HDL goal  should be 30 mg/dL higher than patient's LDL goal (i.e. LDL goal of < 70 mg/dL, would have non-HDL goal of < 100 mg/dL)  . Sodium 09/05/2017 139  135 - 145 mEq/L Final  . Potassium 09/05/2017 4.7  3.5 - 5.1 mEq/L Final  . Chloride 09/05/2017 104  96 - 112 mEq/L Final  . CO2 09/05/2017 29  19 - 32 mEq/L Final  . Glucose, Bld 09/05/2017 209* 70 - 99 mg/dL Final  . BUN 69/62/9528 30* 6 - 23 mg/dL Final  . Creatinine, Ser 09/05/2017 0.88  0.40 - 1.20 mg/dL Final  . Total Bilirubin 09/05/2017 0.3  0.2 - 1.2 mg/dL Final  . Alkaline Phosphatase 09/05/2017 75  39 - 117 U/L Final  . AST 09/05/2017 18  0 - 37 U/L Final  . ALT 09/05/2017 18  0 - 35 U/L Final  . Total Protein 09/05/2017 6.3  6.0 - 8.3 g/dL Final  . Albumin 41/32/4401 3.8  3.5 - 5.2 g/dL Final  . Calcium 02/72/5366 8.8  8.4 - 10.5 mg/dL Final  . GFR 44/06/4740 66.50  >60.00 mL/min Final  . Hgb A1c MFr Bld 09/05/2017 7.3* 4.6 - 6.5 % Final   Glycemic Control Guidelines for People with Diabetes:Non Diabetic:  <6%Goal of Therapy: <7%Additional Action Suggested:  >8%     Physical Examination:  BP 122/60 (BP Location: Left Arm, Patient Position: Sitting, Cuff Size: Normal)   Pulse 82   Ht 5'  4" (1.626 m)   Wt 160 lb 12.8 oz (72.9 kg)   SpO2 92%   BMI 27.60 kg/m      Diabetic Foot Exam - Simple   Simple Foot Form Diabetic Foot exam was performed with the following findings:  Yes   Visual Inspection No deformities, no ulcerations, no other skin breakdown bilaterally:  Yes Sensation Testing See comments:  Yes Pulse Check See comments:  Yes Comments Has absent pedal pulses Absent distal sensation in the feet Some hammertoes present Has decreased muscle mass in the feet and thinning of the skin     ASSESSMENT:  Diabetes type 1, long-standing with overall fairly good control consistently  A1c is now 7.3  See history of present illness for detailed discussion of current diabetes management, blood sugar patterns and problems identified  She is on TRESIBA and mealtime NovoLog  She is very consistently trying to manage her diabetes appropriately with carbohydrate counting and checking blood sugars throughout the day Also trying to keep her weight stable with some exercise However does have significant variability in her blood sugars, most significant after eating meal Hypoglycemia has been infrequent but has some tendency to low sugars early morning, before supper and occasionally after evening meal when she overestimates her bolus insulin Also sometimes when eating out her sugars are higher  She is overtreating hypoglycemia with complex carbohydrate foods and not rapidly acting carbohydrates and this was discussed in detail, discussed example of how her sugar rebounded excessively last night with  not appropriately treating low sugar  Discussed day-to-day diabetes and insulin management, blood sugar patterns adjusting basal and bolus insulins,, avoiding hypoglycemia and management of hypoglycemia more appropriately  LIPIDS: She has history of various vascular problems including PVD and history of coronary bypass Needs more aggressive LDL reduction, already on 40 mg  Crestor  Hypertension: Well controlled with lisinopril 20 mg   Diabetic neuropathy, stable  PLAN:    She will reduce her Evaristo Bury to 10 units to reduce tendency to low sugars overnight  Considering  her age her control does not need to be tight  She will try to look up her carbohydrates more consistently when eating meals including outside  She will need to add protein to breakfast consistently  Again consistently take NovoLog before starting the meal  Use glucose tablets, regular soft drink or other simple sugars for hypoglycemia treatment early  Check preexercise blood sugar consistently  For lipids she will add Zetia and discussed LDL goal of 170, continue 40 mg Crestor explained how his LDL works  There are no Patient Instructions on file for this visit.  Counseling time on subjects discussed in assessment and plan sections is over 50% of today's 25 minute visit   Reather Littler 11/06/2017, 3:18 PM   Note: This note was prepared with Dragon voice recognition system technology. Any transcriptional errors that result from this process are unintentional.

## 2017-11-04 ENCOUNTER — Ambulatory Visit (INDEPENDENT_AMBULATORY_CARE_PROVIDER_SITE_OTHER): Payer: Medicare Other | Admitting: Endocrinology

## 2017-11-04 ENCOUNTER — Encounter: Payer: Self-pay | Admitting: Endocrinology

## 2017-11-04 VITALS — BP 122/60 | HR 82 | Ht 64.0 in | Wt 160.8 lb

## 2017-11-04 DIAGNOSIS — E7800 Pure hypercholesterolemia, unspecified: Secondary | ICD-10-CM | POA: Diagnosis not present

## 2017-11-04 DIAGNOSIS — E1065 Type 1 diabetes mellitus with hyperglycemia: Secondary | ICD-10-CM | POA: Diagnosis not present

## 2017-11-04 MED ORDER — EZETIMIBE 10 MG PO TABS: 10.0000 mg | ORAL_TABLET | Freq: Every day | ORAL | 3 refills | Status: DC

## 2017-12-02 ENCOUNTER — Other Ambulatory Visit: Payer: Self-pay | Admitting: Endocrinology

## 2017-12-02 ENCOUNTER — Ambulatory Visit: Payer: Medicare Other | Admitting: Podiatry

## 2017-12-02 DIAGNOSIS — Z961 Presence of intraocular lens: Secondary | ICD-10-CM | POA: Diagnosis not present

## 2017-12-02 DIAGNOSIS — E103593 Type 1 diabetes mellitus with proliferative diabetic retinopathy without macular edema, bilateral: Secondary | ICD-10-CM | POA: Diagnosis not present

## 2017-12-02 DIAGNOSIS — H35373 Puckering of macula, bilateral: Secondary | ICD-10-CM | POA: Diagnosis not present

## 2017-12-13 ENCOUNTER — Other Ambulatory Visit: Payer: Self-pay | Admitting: Endocrinology

## 2017-12-22 ENCOUNTER — Other Ambulatory Visit: Payer: Self-pay | Admitting: Endocrinology

## 2017-12-23 ENCOUNTER — Telehealth: Payer: Self-pay | Admitting: Endocrinology

## 2017-12-23 MED ORDER — ISOSORBIDE MONONITRATE ER 60 MG PO TB24: 30.0000 mg | ORAL_TABLET | ORAL | 1 refills | Status: DC

## 2017-12-23 NOTE — Telephone Encounter (Signed)
Sent!

## 2017-12-23 NOTE — Telephone Encounter (Signed)
isosorbide mononitrate (IMDUR) 60 MG 24 hr tablet   Patient needs a three month supply sent to the pharmacy. She called the [facility] early today and was told to call our office after 3 hours to see if this has been sent,.     [facility] [place], [place] - [address]

## 2017-12-30 ENCOUNTER — Ambulatory Visit (INDEPENDENT_AMBULATORY_CARE_PROVIDER_SITE_OTHER): Payer: Medicare Other | Admitting: Podiatry

## 2017-12-30 ENCOUNTER — Encounter: Payer: Self-pay | Admitting: Podiatry

## 2017-12-30 DIAGNOSIS — M2042 Other hammer toe(s) (acquired), left foot: Secondary | ICD-10-CM

## 2017-12-30 DIAGNOSIS — Q828 Other specified congenital malformations of skin: Secondary | ICD-10-CM

## 2017-12-30 DIAGNOSIS — M79675 Pain in left toe(s): Secondary | ICD-10-CM | POA: Diagnosis not present

## 2017-12-30 DIAGNOSIS — M2041 Other hammer toe(s) (acquired), right foot: Secondary | ICD-10-CM

## 2017-12-30 DIAGNOSIS — E1142 Type 2 diabetes mellitus with diabetic polyneuropathy: Secondary | ICD-10-CM | POA: Diagnosis not present

## 2017-12-30 DIAGNOSIS — B351 Tinea unguium: Secondary | ICD-10-CM

## 2017-12-30 NOTE — Progress Notes (Addendum)
Complaint:  Visit Type: Patient returns to my office for continued preventative foot care services. Complaint: Patient states" my nails have grown long and thick and become painful to walk and wear shoes" Patient has been diagnosed with DM with neuropathy.  Patient has callus both feet.. The patient presents for preventative foot care services. No changes to ROS  Podiatric Exam: Vascular: dorsalis pedis and posterior tibial pulses are palpable bilateral. Capillary return is immediate. Temperature gradient is WNL. Skin turgor WNL  Sensorium: Diminished  Semmes Weinstein monofilament test. Normal tactile sensation bilaterally. Nail Exam: Pt has thick disfigured discolored nails with subungual debris noted bilateral entire nail hallux through fifth toenails Ulcer Exam: There is no evidence of ulcer or pre-ulcerative changes or infection. Orthopedic Exam: Muscle tone and strength are WNL. No limitations in general ROM. No crepitus or effusions noted. Foot type and digits show no abnormalities. 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 No infection or ulcers  Diagnosis:  Onychomycosis, , Pain in right toe, pain in left toes,  Porokeratosis  B/L.  Treatment & Plan Procedures and Treatment: Consent by patient was obtained for treatment procedures.   Debridement of mycotic and hypertrophic toenails, 1 through 5 bilateral and clearing of subungual debris. No ulceration, no infection noted. Debridement of porokeratosis  B/L. Return Visit-Office Procedure: Patient instructed to return to the office for a follow up visit 3 months for continued evaluation and treatment.    Helane GuntherGregory Marelin Tat DPM

## 2018-02-08 ENCOUNTER — Other Ambulatory Visit: Payer: Self-pay | Admitting: Cardiovascular Disease

## 2018-02-08 DIAGNOSIS — I6521 Occlusion and stenosis of right carotid artery: Secondary | ICD-10-CM

## 2018-02-15 ENCOUNTER — Encounter (HOSPITAL_COMMUNITY): Payer: Medicare Other

## 2018-02-17 ENCOUNTER — Ambulatory Visit (HOSPITAL_COMMUNITY)
Admission: RE | Admit: 2018-02-17 | Discharge: 2018-02-17 | Disposition: A | Discharge status: Home / Self Care | Payer: Medicare Other | Source: Ambulatory Visit | Attending: Cardiovascular Disease | Admitting: Cardiovascular Disease

## 2018-02-17 DIAGNOSIS — I6521 Occlusion and stenosis of right carotid artery: Secondary | ICD-10-CM | POA: Diagnosis not present

## 2018-02-20 ENCOUNTER — Other Ambulatory Visit (INDEPENDENT_AMBULATORY_CARE_PROVIDER_SITE_OTHER): Payer: Medicare Other

## 2018-02-20 DIAGNOSIS — E7800 Pure hypercholesterolemia, unspecified: Secondary | ICD-10-CM

## 2018-02-20 DIAGNOSIS — E1065 Type 1 diabetes mellitus with hyperglycemia: Secondary | ICD-10-CM | POA: Diagnosis not present

## 2018-02-20 LAB — COMPREHENSIVE METABOLIC PANEL
ALT: 22 U/L (ref 0–35)
AST: 20 U/L (ref 0–37)
Albumin: 4.2 g/dL (ref 3.5–5.2)
Alkaline Phosphatase: 86 U/L (ref 39–117)
BILIRUBIN TOTAL: 0.5 mg/dL (ref 0.2–1.2)
BUN: 21 mg/dL (ref 6–23)
CALCIUM: 9.5 mg/dL (ref 8.4–10.5)
CO2: 30 meq/L (ref 19–32)
Chloride: 104 mEq/L (ref 96–112)
Creatinine, Ser: 0.91 mg/dL (ref 0.40–1.20)
GFR: 63.9 mL/min (ref >60.00)
GLUCOSE: 167 mg/dL — AB (ref 70–99)
Potassium: 4.5 mEq/L (ref 3.5–5.1)
Sodium: 139 mEq/L (ref 135–145)
Total Protein: 6.6 g/dL (ref 6.0–8.3)

## 2018-02-20 LAB — LIPID PANEL
CHOL/HDL RATIO: 2
Cholesterol: 131 mg/dL (ref 0–200)
HDL: 78.9 mg/dL (ref >39.00)
LDL Cholesterol: 42 mg/dL (ref 0–99)
NONHDL: 51.69
TRIGLYCERIDES: 47 mg/dL (ref 0.0–149.0)
VLDL: 9.4 mg/dL (ref 0.0–40.0)

## 2018-02-20 LAB — HEMOGLOBIN A1C: Hgb A1c MFr Bld: 7.6 % — ABNORMAL HIGH (ref 4.6–6.5)

## 2018-02-22 ENCOUNTER — Other Ambulatory Visit: Payer: Self-pay

## 2018-02-22 MED ORDER — EZETIMIBE 10 MG PO TABS: 10.0000 mg | ORAL_TABLET | Freq: Every day | ORAL | 3 refills | Status: DC

## 2018-02-22 MED ORDER — ISOSORBIDE MONONITRATE ER 60 MG PO TB24: 30.0000 mg | ORAL_TABLET | ORAL | 1 refills | Status: DC

## 2018-02-22 MED ORDER — CARVEDILOL 6.25 MG PO TABS: 6.2500 mg | ORAL_TABLET | Freq: Two times a day (BID) | ORAL | 3 refills | Status: DC

## 2018-02-22 MED ORDER — LISINOPRIL 20 MG PO TABS: 20.0000 mg | ORAL_TABLET | Freq: Every day | ORAL | 3 refills | Status: DC

## 2018-02-22 MED ORDER — ROSUVASTATIN CALCIUM 40 MG PO TABS: 40.0000 mg | ORAL_TABLET | Freq: Every day | ORAL | 0 refills | Status: DC

## 2018-02-24 ENCOUNTER — Telehealth: Payer: Self-pay | Admitting: *Deleted

## 2018-02-24 ENCOUNTER — Ambulatory Visit: Payer: Medicare Other | Admitting: Endocrinology

## 2018-02-24 DIAGNOSIS — I739 Peripheral vascular disease, unspecified: Principal | ICD-10-CM

## 2018-02-24 DIAGNOSIS — I779 Disorder of arteries and arterioles, unspecified: Secondary | ICD-10-CM

## 2018-02-24 NOTE — Telephone Encounter (Signed)
-----   Message from [place] Ouch, MD sent at 02/22/2018  9:52 AM EST ----- Inform patient that carotid Doppler showed chronically occluded right carotid artery with mild disease on the left side.  Repeat study in 1 year.

## 2018-02-24 NOTE — Telephone Encounter (Signed)
Left a message for the patient to call back.  

## 2018-02-28 ENCOUNTER — Other Ambulatory Visit: Payer: Self-pay | Admitting: Endocrinology

## 2018-03-02 ENCOUNTER — Telehealth: Payer: Self-pay | Admitting: Endocrinology

## 2018-03-02 NOTE — Telephone Encounter (Signed)
Patients daughter called stating patient has severe itching. The daughter wants to know if there is anything Dr. Lucianne MussKumar can do. Medicines and lotions do not seem to help. Please Advise, thanks

## 2018-03-02 NOTE — Telephone Encounter (Signed)
Please advise 

## 2018-03-03 ENCOUNTER — Other Ambulatory Visit: Payer: Self-pay

## 2018-03-03 ENCOUNTER — Telehealth: Payer: Self-pay | Admitting: Endocrinology

## 2018-03-03 NOTE — Telephone Encounter (Signed)
She will need to discuss with her PCP, not related to diabetes

## 2018-03-03 NOTE — Telephone Encounter (Signed)
Natasha Hernandez called re: she did not receive a return call re the following. Patient's legs have been itching. Please call Natasha Hernandez at ph# [phone] to advise.

## 2018-03-03 NOTE — Telephone Encounter (Signed)
Notified pt daughter--pt need to see PCP for the itching symptoms and related to diabetes by Dr. Lucianne MussKumar. Pt daughter thinking something to do with neuropathy, but will contact pt PCP.

## 2018-03-07 NOTE — Telephone Encounter (Signed)
Left a message for the patient to call back.  

## 2018-03-08 NOTE — Telephone Encounter (Signed)
Patient's daughter in law, per dpr, made aware of results and verbalized understanding. Repeat orders placed.

## 2018-03-19 NOTE — Progress Notes (Signed)
Patient ID: Natasha Hernandez, female   DOB: 07/29/1940, 77 y.o.   MRN: [id number]            Reason for Appointment : f/u for Type 1 Diabetes  History of Present Illness          Diagnosis: Type 1 diabetes mellitus, date of diagnosis: 1953          Previous history:  She thinks she has been on various insulin regimens over the years but has been on Lantus for 10-12 years along with mealtime insulin Previously followed by an endocrinologist but no records available. Her A1c has usually been around 7% reportedly and was 7.2 in 7/16   Recent history:   Her A1c is fairly stable at 7.6    INSULIN regimen is: [place] 11 units daily, NOVOLOG carbohydrate coverage 1:5 and 1: 50 correction factor at meals  Current management, blood sugar patterns and problems identified:     Although her blood sugars are overall similar to her last visit with average and the time in target range she does occasionally have more variability  See patterns discussed below  She thinks that she has difficulty estimating how much NovoLog to take for meals when she is eating out but does better when she is eating at home and eating low carbohydrate meals  Also she is concerned that sometimes her blood sugars tend to stay persistently high  Also overnight blood sugars are still variable with mostly high readings but occasionally has very good readings or sporadically low sugars also  Previously she had been concerned about the cost of insulin pump technology but does not appear to have much understanding of this concept  Glucose monitoring:  is being done several times a day         Glucometer: Freestyle libre  Blood Glucose readings and analysis as below from [employer] for the 2 weeks until today   CONTINUOUS GLUCOSE MONITORING RECORD INTERPRETATION    Dates of Recording: 11/19 through 12/2  Sensor description: [employer]  Results statistics:   CGM use % of time  97  Average  and SD  158+/-56  Time in range       71 %  % Time Above 180  28  % Time above 250  6  % Time Below target  1    Glycemic patterns summary: Blood sugars are averaging mostly between 130 and 180 throughout the day with the most variability in the late afternoon and overnight and minimal hypoglycemia compared to the last visit  Hyperglycemic episodes are occurring mostly postprandially and frequently after lunch with only about twice in the last week.  Also has occasional increased blood sugars after dinner.  Blood sugars are irregularly high through the night also  Hypoglycemic episodes occurred twice around 4 AM one episode following correction of her high readings last Saturday night  Overnight periods: Blood sugar patterns are variable with glucose averaging about 140 but generally between 100 and about 180 with a flat profile  Preprandial periods: Blood sugars are mildly increased at breakfast time and lunchtime between about 140-150 and overall relatively higher at dinnertime with more variability  Postprandial periods: After breakfast: Blood sugars are minimally increased and showing a flat profile    After lunch: Are much more variable and occasionally may spike up to 350 After dinner: Have been high only only about 2 or 3 occasions in the last 2 weeks otherwise blood sugars are relatively well controlled  which she thinks is from her mostly eating low carbohydrate meals in the evening   CGM use % of time  97  Average and SD  156+/-55  Time in range      69 %  % Time Above 180  27  % Time above 250  6  % Time Below target  4      PRE-MEAL Fasting Lunch Dinner Bedtime Overall  Glucose range:       Mean/median:  141  159  177  155  158   POST-MEAL PC Breakfast PC Lunch PC Dinner  Glucose range:     Mean/median:  163  184  164    Factors causing Hypoglycemia: Overestimating mealtime coverage, exercise  Symptoms of hypoglycemia: Some weakness, confusion at times Treatment  of hypoglycemia: Corn syrup or jellybeans/other sweets.  She will get 15 g of carbohydrate  Self-care: The diet that the patient has been following is: Carbohydrate counting  Mealtimes are: Breakfast AT 8.30 AM Dinner: 7.30   For breakfast will have toast with cheese or egg, sometimes cereal and egg Periodically eating out         Exercise:  Only occasionally uses. exercise bike or other activities         Dietician consultation: Most recent: Years ago .         CDE consultation: 04/28/16    Wt Readings from Last 3 Encounters:  03/20/18 163 lb 6.4 oz (74.1 kg)  11/04/17 160 lb 12.8 oz (72.9 kg)  06/10/17 162 lb 12.8 oz (73.8 kg)    Lab Results  Component Value Date   HGBA1C 7.6 (H) 02/20/2018   HGBA1C 7.3 (H) 09/05/2017   HGBA1C 7.2 (H) 06/06/2017   Lab Results  Component Value Date   MICROALBUR <0.7 06/06/2017   LDLCALC 42 02/20/2018   CREATININE 0.91 02/20/2018    Lab Results  Component Value Date   MICRALBCREAT 1.1 06/06/2017   MICRALBCREAT 0.8 05/04/2016     Allergies as of 03/20/2018   No Known Allergies     Medication List        Accurate as of 03/20/18 11:50 AM. Always use your most recent med list.          aspirin EC 81 MG tablet Take 1 tablet (81 mg total) by mouth daily.   CALCIUM MAGNESIUM PO Take 1 tablet by mouth daily.   carvedilol 6.25 MG tablet Commonly known as:  COREG Take 1 tablet (6.25 mg total) by mouth 2 (two) times daily with a meal.   CO Q 10 PO Take 1 capsule by mouth daily.   ezetimibe 10 MG tablet Commonly known as:  ZETIA Take 1 tablet (10 mg total) by mouth daily.   FREESTYLE LIBRE READER Devi 1 Device by Does not apply route every 30 (thirty) days.   FREESTYLE LIBRE SENSOR SYSTEM Misc Inject 3 Devices into the skin every 14 (fourteen) days. Dx code- E10.65 NDC- [id number]   insulin aspart 100 UNIT/ML FlexPen Commonly known as:  NOVOLOG Inject 2-15 Units into the skin 3 (three) times daily before meals. Sliding  scale: 0-150 equals 2 units, increase 1 units for every 50 blood sugar reading after 150. Formula for carbs is number of carbs divided by 5.   isosorbide mononitrate 60 MG 24 hr tablet Commonly known as:  IMDUR Take 0.5 tablets (30 mg total) by mouth every morning. TAke 1 tablet by mouth every evening   lisinopril 20 MG tablet Commonly known as:  PRINIVIL,ZESTRIL Take 1 tablet (20 mg total) by mouth daily.   multivitamin tablet Take 1 tablet by mouth daily.   nitroGLYCERIN 0.4 MG/SPRAY spray Commonly known as:  NITROLINGUAL Place 1 spray under the tongue every 5 (five) minutes x 3 doses as needed for chest pain (Call 911 if taking 3rd spray).   rosuvastatin 40 MG tablet Commonly known as:  CRESTOR TAKE 1 TABLET BY MOUTH ONCE DAILY (INCREASED  DOSE)   TRESIBA FLEXTOUCH 100 UNIT/ML Sopn FlexTouch Pen Generic drug:  insulin degludec INJECT 11 UNITS INTO THE SKIN DAILY.       Allergies: No Known Allergies  Past Medical History:  Diagnosis Date  . Carotid artery occlusion   . Coronary artery disease    CABG in 1996. Most recent cardiac catheterization in 2013 showed patent grafts including LIMA to LAD, SVG to OM, SVG to diagonal and SVG to PDA.  . Diabetes mellitus without complication (HCC)   . Hyperlipidemia   . Hypertension   . PAD (peripheral artery disease) (HCC)     Past Surgical History:  Procedure Laterality Date  . CORONARY ARTERY BYPASS GRAFT  1996    History reviewed. No pertinent family history.  Social History:  reports that she has never smoked. She has never used smokeless tobacco. She reports that she does not drink alcohol. Her drug history is not on file.      Review of Systems   Lipids:  She is on Crestor 40 mg daily and since 7/19 also on Zetia  Has history of CAD in the past several years ago with coronary bypass LDL is much better with this   Lab Results  Component Value Date   CHOL 131 02/20/2018   HDL 78.90 02/20/2018   LDLCALC 42  02/20/2018   TRIG 47.0 02/20/2018   CHOLHDL 2 02/20/2018    Has had proliferative retinopathy, has been going regularly  Neuropathy with some sensory loss: Forms filled out for diabetic shoes in 2018  LABS:  No visits with results within 1 Week(s) from this visit.  Latest known visit with results is:  Lab on 02/20/2018  Component Date Value Ref Range Status  . Cholesterol 02/20/2018 131  0 - 200 mg/dL Final   ATP III Classification       Desirable:  < 200 mg/dL               Borderline High:  200 - 239 mg/dL          High:  > = 161 mg/dL  . Triglycerides 02/20/2018 47.0  0.0 - 149.0 mg/dL Final   Normal:  <096 mg/dLBorderline High:  150 - 199 mg/dL  . HDL 02/20/2018 78.90  >39.00 mg/dL Final  . VLDL 04/54/0981 9.4  0.0 - 19.1 mg/dL Final  . LDL Cholesterol 02/20/2018 42  0 - 99 mg/dL Final  . Total CHOL/HDL Ratio 02/20/2018 2   Final                  Men          Women1/2 Average Risk     3.4          3.3Average Risk          5.0          4.42X Average Risk          9.6          7.13X Average Risk          15.0  11.0                      . NonHDL 02/20/2018 51.69   Final   NOTE:  Non-HDL goal should be 30 mg/dL higher than patient's LDL goal (i.e. LDL goal of < 70 mg/dL, would have non-HDL goal of < 100 mg/dL)  . Sodium 02/20/2018 139  135 - 145 mEq/L Final  . Potassium 02/20/2018 4.5  3.5 - 5.1 mEq/L Final  . Chloride 02/20/2018 104  96 - 112 mEq/L Final  . CO2 02/20/2018 30  19 - 32 mEq/L Final  . Glucose, Bld 02/20/2018 167* 70 - 99 mg/dL Final  . BUN 82/95/6213 21  6 - 23 mg/dL Final  . Creatinine, Ser 02/20/2018 0.91  0.40 - 1.20 mg/dL Final  . Total Bilirubin 02/20/2018 0.5  0.2 - 1.2 mg/dL Final  . Alkaline Phosphatase 02/20/2018 86  39 - 117 U/L Final  . AST 02/20/2018 20  0 - 37 U/L Final  . ALT 02/20/2018 22  0 - 35 U/L Final  . Total Protein 02/20/2018 6.6  6.0 - 8.3 g/dL Final  . Albumin 08/65/7846 4.2  3.5 - 5.2 g/dL Final  . Calcium 96/29/5284 9.5  8.4 -  10.5 mg/dL Final  . GFR 13/24/4010 63.90  >60.00 mL/min Final  . Hgb A1c MFr Bld 02/20/2018 7.6* 4.6 - 6.5 % Final   Glycemic Control Guidelines for People with Diabetes:Non Diabetic:  <6%Goal of Therapy: <7%Additional Action Suggested:  >8%     Physical Examination:  BP 126/60 (BP Location: Left Arm, Patient Position: Sitting, Cuff Size: Normal)   Pulse 78   Ht 5\' 4"  (1.626 m)   Wt 163 lb 6.4 oz (74.1 kg)   SpO2 97%   BMI 28.05 kg/m      ASSESSMENT:  Diabetes type 1, long-standing on basal bolus insulin injections  A1c is now 7.6, previously 7.3  See history of present illness for detailed discussion of current diabetes management, blood sugar patterns and problems identified  With using the freestyle libre sensor she is able to keep up with her blood sugars well However she is concerned about her difficulty with getting consistent control and periods of hypoglycemia as discussed above Also she has difficulty estimating her mealtime and correction doses of NovoLog Reviewed her day-to-day management today Discussed blood sugar patterns Since her fasting readings are not consistently high her basal insulin dose of 11 units appear to be adequate  LIPIDS: She has history of PVD and history of coronary bypass Able to achieve better LDL reduction with adding Zetia now, previously on 40 mg Crestor  Hypertension: Well controlled with lisinopril 20 mg     PLAN:   She will keep a record of her meals, blood sugar before and after eating and insulin doses for at least 3 days before going to the nurse educator for review Today discussed the mechanism of insulin delivery with the insulin pump, options offered and how this could improve her control and current availability various options Since there is no consistent pattern will not change her insulin doses she can continue 1: 5 coverage for her meals Likely needs higher insulin coverage for higher fat meals and going out for  restaurant meals She will need to make corrections only to bring her sugar down to 150 when a high readings at bedtime and regular exercise  She has been reluctant to do influenza vaccines but reassured on the safety of this and need  for adequate protection her diabetes and age high-dose influenza vaccine was given  There are no Patient Instructions on file for this visit.  Counseling time on subjects discussed in assessment and plan sections is over 50% of today's 25 minute visit   Reather Littlerjay Irma Roulhac 03/20/2018, 11:50 AM   Note: This note was prepared with Dragon voice recognition system technology. Any transcriptional errors that result from this process are unintentional.

## 2018-03-20 ENCOUNTER — Encounter: Payer: Self-pay | Admitting: Endocrinology

## 2018-03-20 ENCOUNTER — Ambulatory Visit (INDEPENDENT_AMBULATORY_CARE_PROVIDER_SITE_OTHER): Payer: Medicare Other | Admitting: Endocrinology

## 2018-03-20 VITALS — BP 126/60 | HR 78 | Ht 64.0 in | Wt 163.4 lb

## 2018-03-20 DIAGNOSIS — E7800 Pure hypercholesterolemia, unspecified: Secondary | ICD-10-CM | POA: Diagnosis not present

## 2018-03-20 DIAGNOSIS — I6521 Occlusion and stenosis of right carotid artery: Secondary | ICD-10-CM | POA: Diagnosis not present

## 2018-03-20 DIAGNOSIS — I739 Peripheral vascular disease, unspecified: Secondary | ICD-10-CM | POA: Diagnosis not present

## 2018-03-20 DIAGNOSIS — E1065 Type 1 diabetes mellitus with hyperglycemia: Secondary | ICD-10-CM

## 2018-03-20 DIAGNOSIS — Z23 Encounter for immunization: Secondary | ICD-10-CM

## 2018-03-27 ENCOUNTER — Encounter: Payer: Medicare Other | Attending: Endocrinology | Admitting: Nutrition

## 2018-03-27 DIAGNOSIS — E1065 Type 1 diabetes mellitus with hyperglycemia: Secondary | ICD-10-CM | POA: Diagnosis not present

## 2018-03-27 DIAGNOSIS — E1042 Type 1 diabetes mellitus with diabetic polyneuropathy: Secondary | ICD-10-CM

## 2018-03-29 ENCOUNTER — Encounter: Payer: Self-pay | Admitting: Endocrinology

## 2018-03-29 NOTE — Progress Notes (Signed)
Patient is here with her son to review her diet and insulin doses Typical day: 7:30-8AM Up check blood sugars-usually 120x-130s.  Takes Evaristo Buryresiba and 2u of Novolog 8:30 Bfast:  2 eggs with cheese, no carbs  Water to drink.   1-1:30 lunch:  Spinach sald with chees and meat from night before.  Dressing on salad.  Water or diet drink  3u of Novolog. 6:30-7PM: supper is meat-5-6 ounces with non starchy vegetables.  Water to drink  Taking 5u for this. Patient never changes her dose and says she is eating about the same things with every meal. Discussed the idea of the need for carbs at each meal, and the fact that fats (which she is eating a lot of) can raise blood sugar.  Suggestions given for lowering fat in her meals--less oil in pans for eggs, less salad dressings, and no cheese if eating other protein  Discussed the idea of testing acL to adjust AM Novolog dose.  She agreed to do this.  As well as testing acS to determine if acL Novolog dose is correct, and HS if acS Novolog is correct.  They reported good understanding of this. Also discussed the need for some carbs in the meals, but she is not receptive to this.   She will call me in one week with blood sugar reading.s

## 2018-03-29 NOTE — Patient Instructions (Signed)
Test blood sugar ac meals and at bedtime (scan [place]), and call readings to me next week. Reduce oil in frying and reduce salad dressing amounts.

## 2018-03-31 ENCOUNTER — Encounter: Payer: Self-pay | Admitting: Podiatry

## 2018-03-31 ENCOUNTER — Ambulatory Visit (INDEPENDENT_AMBULATORY_CARE_PROVIDER_SITE_OTHER): Payer: Medicare Other | Admitting: Podiatry

## 2018-03-31 DIAGNOSIS — B351 Tinea unguium: Secondary | ICD-10-CM

## 2018-03-31 DIAGNOSIS — M79675 Pain in left toe(s): Secondary | ICD-10-CM

## 2018-03-31 DIAGNOSIS — M2042 Other hammer toe(s) (acquired), left foot: Secondary | ICD-10-CM

## 2018-03-31 DIAGNOSIS — M2041 Other hammer toe(s) (acquired), right foot: Secondary | ICD-10-CM

## 2018-03-31 DIAGNOSIS — E1142 Type 2 diabetes mellitus with diabetic polyneuropathy: Secondary | ICD-10-CM

## 2018-03-31 DIAGNOSIS — Q828 Other specified congenital malformations of skin: Secondary | ICD-10-CM

## 2018-03-31 NOTE — Progress Notes (Signed)
Complaint:  Visit Type: Patient returns to my office for continued preventative foot care services. Complaint: Patient states" my nails have grown long and thick and become painful to walk and wear shoes" Patient has been diagnosed with DM with neuropathy.  Patient has callus both feet.. The patient presents for preventative foot care services. No changes to ROS  Podiatric Exam: Vascular: dorsalis pedis and posterior tibial pulses are palpable bilateral. Capillary return is immediate. Temperature gradient is WNL. Skin turgor WNL  Sensorium: Diminished  Semmes Weinstein monofilament test. Normal tactile sensation bilaterally. Nail Exam: Pt has thick disfigured discolored nails with subungual debris noted bilateral entire nail hallux through fifth toenails Ulcer Exam: There is no evidence of ulcer or pre-ulcerative changes or infection. Orthopedic Exam: Muscle tone and strength are WNL. No limitations in general ROM. No crepitus or effusions noted. Foot type and digits show no abnormalities. 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 No infection or ulcers  Diagnosis:  Onychomycosis, , Pain in right toe, pain in left toes,  Porokeratosis  B/L.  Treatment & Plan Procedures and Treatment: Consent by patient was obtained for treatment procedures.   Debridement of mycotic and hypertrophic toenails, 1 through 5 bilateral and clearing of subungual debris. No ulceration, no infection noted. Debridement of porokeratosis  B/L. Return Visit-Office Procedure: Patient instructed to return to the office for a follow up visit 3 months for continued evaluation and treatment.    Natasha Hernandez DPM 

## 2018-04-06 ENCOUNTER — Other Ambulatory Visit: Payer: Self-pay

## 2018-04-06 MED ORDER — OMNIPOD DASH PODS (GEN 4) MISC: 1.0000 | 11 refills | Status: DC

## 2018-04-15 ENCOUNTER — Emergency Department (HOSPITAL_COMMUNITY): Payer: Medicare Other

## 2018-04-15 ENCOUNTER — Observation Stay (HOSPITAL_COMMUNITY)
Admission: EM | Admit: 2018-04-15 | Discharge: 2018-04-16 | Disposition: A | Discharge status: Home / Self Care | Payer: Medicare Other | Attending: Internal Medicine | Admitting: Internal Medicine

## 2018-04-15 ENCOUNTER — Encounter (HOSPITAL_COMMUNITY): Payer: Self-pay | Admitting: Emergency Medicine

## 2018-04-15 DIAGNOSIS — R0789 Other chest pain: Secondary | ICD-10-CM | POA: Diagnosis not present

## 2018-04-15 DIAGNOSIS — Z951 Presence of aortocoronary bypass graft: Secondary | ICD-10-CM

## 2018-04-15 DIAGNOSIS — R079 Chest pain, unspecified: Secondary | ICD-10-CM | POA: Diagnosis present

## 2018-04-15 DIAGNOSIS — E1051 Type 1 diabetes mellitus with diabetic peripheral angiopathy without gangrene: Secondary | ICD-10-CM | POA: Insufficient documentation

## 2018-04-15 DIAGNOSIS — Z7982 Long term (current) use of aspirin: Secondary | ICD-10-CM | POA: Insufficient documentation

## 2018-04-15 DIAGNOSIS — I252 Old myocardial infarction: Secondary | ICD-10-CM | POA: Diagnosis not present

## 2018-04-15 DIAGNOSIS — I10 Essential (primary) hypertension: Secondary | ICD-10-CM | POA: Diagnosis present

## 2018-04-15 DIAGNOSIS — N179 Acute kidney failure, unspecified: Secondary | ICD-10-CM | POA: Diagnosis present

## 2018-04-15 DIAGNOSIS — E7800 Pure hypercholesterolemia, unspecified: Secondary | ICD-10-CM | POA: Insufficient documentation

## 2018-04-15 DIAGNOSIS — E1065 Type 1 diabetes mellitus with hyperglycemia: Secondary | ICD-10-CM | POA: Insufficient documentation

## 2018-04-15 DIAGNOSIS — E1042 Type 1 diabetes mellitus with diabetic polyneuropathy: Secondary | ICD-10-CM | POA: Diagnosis not present

## 2018-04-15 DIAGNOSIS — I2510 Atherosclerotic heart disease of native coronary artery without angina pectoris: Secondary | ICD-10-CM | POA: Insufficient documentation

## 2018-04-15 LAB — CBC
HCT: 36.5 % (ref 36.0–46.0)
Hemoglobin: 11.8 g/dL — ABNORMAL LOW (ref 12.0–15.0)
MCH: 32.9 pg (ref 26.0–34.0)
MCHC: 32.3 g/dL (ref 30.0–36.0)
MCV: 101.7 fL — AB (ref 80.0–100.0)
PLATELETS: 168 10*3/uL (ref 150–400)
RBC: 3.59 MIL/uL — ABNORMAL LOW (ref 3.87–5.11)
RDW: 13.3 % (ref 11.5–15.5)
WBC: 5.5 10*3/uL (ref 4.0–10.5)
nRBC: 0 % (ref 0.0–0.2)

## 2018-04-15 LAB — BASIC METABOLIC PANEL
Anion gap: 7 (ref 5–15)
BUN: 33 mg/dL — ABNORMAL HIGH (ref 8–23)
CALCIUM: 9.7 mg/dL (ref 8.9–10.3)
CO2: 30 mmol/L (ref 22–32)
CREATININE: 1.57 mg/dL — AB (ref 0.44–1.00)
Chloride: 101 mmol/L (ref 98–111)
GFR calc Af Amer: 37 mL/min — ABNORMAL LOW (ref >60)
GFR calc non Af Amer: 32 mL/min — ABNORMAL LOW (ref >60)
Glucose, Bld: 128 mg/dL — ABNORMAL HIGH (ref 70–99)
Potassium: 4.5 mmol/L (ref 3.5–5.1)
Sodium: 138 mmol/L (ref 135–145)

## 2018-04-15 LAB — I-STAT TROPONIN, ED: Troponin i, poc: 0.01 ng/mL (ref 0.00–0.08)

## 2018-04-16 ENCOUNTER — Encounter (HOSPITAL_COMMUNITY): Payer: Self-pay | Admitting: Physician Assistant

## 2018-04-16 DIAGNOSIS — R079 Chest pain, unspecified: Secondary | ICD-10-CM | POA: Diagnosis present

## 2018-04-16 DIAGNOSIS — N179 Acute kidney failure, unspecified: Secondary | ICD-10-CM

## 2018-04-16 DIAGNOSIS — E1042 Type 1 diabetes mellitus with diabetic polyneuropathy: Secondary | ICD-10-CM | POA: Diagnosis not present

## 2018-04-16 DIAGNOSIS — I10 Essential (primary) hypertension: Secondary | ICD-10-CM | POA: Diagnosis not present

## 2018-04-16 DIAGNOSIS — Z951 Presence of aortocoronary bypass graft: Secondary | ICD-10-CM | POA: Diagnosis not present

## 2018-04-16 DIAGNOSIS — R0789 Other chest pain: Secondary | ICD-10-CM | POA: Diagnosis not present

## 2018-04-16 HISTORY — DX: Acute kidney failure, unspecified: N17.9

## 2018-04-16 LAB — HEPATIC FUNCTION PANEL
ALT: 23 U/L (ref 0–44)
AST: 22 U/L (ref 15–41)
Albumin: 3.9 g/dL (ref 3.5–5.0)
Alkaline Phosphatase: 75 U/L (ref 38–126)
Bilirubin, Direct: <0.1 mg/dL (ref 0.0–0.2)
Total Bilirubin: 0.3 mg/dL (ref 0.3–1.2)
Total Protein: 6.2 g/dL — ABNORMAL LOW (ref 6.5–8.1)

## 2018-04-16 LAB — TROPONIN I
Troponin I: <0.03 ng/mL (ref <0.03)
Troponin I: <0.03 ng/mL (ref <0.03)

## 2018-04-16 LAB — BASIC METABOLIC PANEL
Anion gap: 10 (ref 5–15)
BUN: 28 mg/dL — ABNORMAL HIGH (ref 8–23)
CO2: 25 mmol/L (ref 22–32)
Calcium: 9 mg/dL (ref 8.9–10.3)
Chloride: 103 mmol/L (ref 98–111)
Creatinine, Ser: 1.02 mg/dL — ABNORMAL HIGH (ref 0.44–1.00)
GFR calc Af Amer: >60 mL/min (ref >60)
GFR calc non Af Amer: 53 mL/min — ABNORMAL LOW (ref >60)
Glucose, Bld: 150 mg/dL — ABNORMAL HIGH (ref 70–99)
Potassium: 3.9 mmol/L (ref 3.5–5.1)
Sodium: 138 mmol/L (ref 135–145)

## 2018-04-16 MED ORDER — INSULIN ASPART 100 UNIT/ML ~~LOC~~ SOLN
2.0000 [IU] | Freq: Three times a day (TID) | SUBCUTANEOUS | Status: DC
  Administered 2018-04-16: 4 [IU] via SUBCUTANEOUS

## 2018-04-16 MED ORDER — SODIUM CHLORIDE 0.9 % IV SOLN
INTRAVENOUS | Status: DC
  Administered 2018-04-16: 03:00:00 via INTRAVENOUS

## 2018-04-16 MED ORDER — ASPIRIN 81 MG PO CHEW: 81.0000 mg | CHEWABLE_TABLET | Freq: Once | ORAL | Status: DC

## 2018-04-16 MED ORDER — ENOXAPARIN SODIUM 40 MG/0.4ML ~~LOC~~ SOLN
40.0000 mg | Freq: Every day | SUBCUTANEOUS | Status: DC
  Administered 2018-04-16: 40 mg via SUBCUTANEOUS
  Filled 2018-04-16: qty 0.4

## 2018-04-16 MED ORDER — INSULIN ASPART 100 UNIT/ML ~~LOC~~ SOLN: 0.0000 [IU] | SUBCUTANEOUS | Status: DC

## 2018-04-16 MED ORDER — INSULIN GLARGINE 100 UNIT/ML ~~LOC~~ SOLN
11.0000 [IU] | Freq: Every day | SUBCUTANEOUS | Status: DC
  Administered 2018-04-16: 11 [IU] via SUBCUTANEOUS
  Filled 2018-04-16: qty 0.11

## 2018-04-16 MED ORDER — ADULT MULTIVITAMIN W/MINERALS CH
1.0000 | ORAL_TABLET | Freq: Every day | ORAL | Status: DC
  Administered 2018-04-16: 1 via ORAL
  Filled 2018-04-16: qty 1

## 2018-04-16 MED ORDER — ASPIRIN 81 MG PO CHEW
324.0000 mg | CHEWABLE_TABLET | Freq: Once | ORAL | Status: DC
  Filled 2018-04-16: qty 4

## 2018-04-16 MED ORDER — ONDANSETRON HCL 4 MG/2ML IJ SOLN: 4.0000 mg | Freq: Four times a day (QID) | INTRAMUSCULAR | Status: DC | PRN

## 2018-04-16 MED ORDER — ASPIRIN EC 81 MG PO TBEC
81.0000 mg | DELAYED_RELEASE_TABLET | Freq: Every day | ORAL | Status: DC
  Administered 2018-04-16: 81 mg via ORAL
  Filled 2018-04-16: qty 1

## 2018-04-16 MED ORDER — INSULIN DEGLUDEC 100 UNIT/ML ~~LOC~~ SOPN
11.0000 [IU] | PEN_INJECTOR | Freq: Every day | SUBCUTANEOUS | Status: DC
  Filled 2018-04-16: qty 3

## 2018-04-16 MED ORDER — CARVEDILOL 12.5 MG PO TABS
6.2500 mg | ORAL_TABLET | Freq: Two times a day (BID) | ORAL | Status: DC
  Administered 2018-04-16: 6.25 mg via ORAL
  Filled 2018-04-16: qty 1

## 2018-04-16 MED ORDER — ROSUVASTATIN CALCIUM 20 MG PO TABS
40.0000 mg | ORAL_TABLET | Freq: Every day | ORAL | Status: DC
  Administered 2018-04-16: 40 mg via ORAL
  Filled 2018-04-16: qty 2

## 2018-04-16 MED ORDER — ISOSORBIDE MONONITRATE ER 60 MG PO TB24: 60.0000 mg | ORAL_TABLET | Freq: Every day | ORAL | Status: DC

## 2018-04-16 MED ORDER — ACETAMINOPHEN 325 MG PO TABS: 650.0000 mg | ORAL_TABLET | ORAL | Status: DC | PRN

## 2018-04-16 MED ORDER — ISOSORBIDE MONONITRATE ER 30 MG PO TB24
30.0000 mg | ORAL_TABLET | Freq: Every day | ORAL | Status: DC
  Administered 2018-04-16: 30 mg via ORAL
  Filled 2018-04-16: qty 1

## 2018-04-16 MED ORDER — SODIUM CHLORIDE 0.9 % IV SOLN: Freq: Once | INTRAVENOUS | Status: DC

## 2018-04-16 NOTE — Consult Note (Addendum)
Cardiology Consultation:   Patient ID: Natasha Hernandez; [id number]; 08/19/1940   Admit date: 04/15/2018 Date of Consult: 04/16/2018  Primary Care Provider: Patient, No Pcp Per Primary Cardiologist: Lorine BearsMuhammad Arida, MD 03/25/2017 Primary Electrophysiologist:  None   Patient Profile:   Natasha Hernandez is a 77 y.o. 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  History of Present Illness:   Natasha Hernandez had her carotids done 11/01, was supposed to f/u in a year.  She walks in good weather, last time 3 weeks ago. She also gardens. Her blood sugar is not that stable, she cannot exercise when CBG is low.   Yesterday, she was packing, [place]. She was in a hurry, under stress. She gets chest pain with activities such as making cookies and ironing.   When she gets pain, she will rest. The pain will resolve in an hour or less. She does not take nitro, says someone told her she could not take nitro because of the Imdur.   Yesterday, the pain was 8/10, normal for her. It did not resolve as usual. That is why she came in. She came to the ER about 7:30 pm, had Xray and blood work, but was in the waiting room till  [employer]. Sometime while she was in the waiting room, the pain went away. It has not returned.   The pain is like a knife going through to her back. She did not have chest pain with her MI. L wrist pain was her MI symptom.    Past Medical History:  Diagnosis Date  . Carotid artery occlusion   . Coronary artery disease    CABG in 1996. Most recent cardiac catheterization in 2013 showed patent grafts including LIMA to LAD, SVG to OM, SVG to diagonal and SVG to PDA.  . Diabetes mellitus without complication (HCC)   . Hyperlipidemia   . Hypertension   . PAD (peripheral artery  disease) (HCC)     Past Surgical History:  Procedure Laterality Date  . CORONARY ARTERY BYPASS GRAFT  1996     Prior to Admission medications   Medication Sig Start Date End Date Taking? Authorizing Provider  aspirin EC 81 MG tablet Take 1 tablet (81 mg total) by mouth daily. 03/25/17  Yes Iran OuchArida, Muhammad A, MD  Calcium-Magnesium-Vitamin D (CALCIUM MAGNESIUM PO) Take 1 tablet by mouth daily.   Yes [provider]  carvedilol (COREG) 6.25 MG tablet Take 1 tablet (6.25 mg total) by mouth 2 (two) times daily with a meal. 02/22/18  Yes Reather LittlerKumar, Ajay, MD  Coenzyme Q10 (CO Q 10 PO) Take 1 capsule by mouth daily.   Yes [provider]  ezetimibe (ZETIA) 10 MG tablet Take 1 tablet (10 mg total) by mouth daily. 02/22/18  Yes Reather LittlerKumar, Ajay, MD  insulin aspart (NOVOLOG FLEXPEN) 100 UNIT/ML FlexPen Inject 2-15 Units into the skin 3 (three) times daily before meals. Sliding scale: 0-150 equals 2 units, increase 1 units for every 50 blood sugar reading after 150. Formula for carbs is number of carbs divided by 5. 05/23/17  Yes Reather LittlerKumar, Ajay, MD  isosorbide mononitrate (IMDUR) 60 MG 24 hr tablet Take 0.5 tablets (30 mg total) by mouth every morning. TAke 1 tablet by mouth every evening Patient taking differently: Take  30-60 mg by mouth See admin instructions. Take 1/2 tablet every morning and take 1 tablet every evening 02/22/18  Yes Reather LittlerKumar, Ajay, MD  lisinopril (PRINIVIL,ZESTRIL) 20 MG tablet Take 1 tablet (20 mg total) by mouth daily. 02/22/18  Yes Reather LittlerKumar, Ajay, MD  Multiple Vitamin (MULTIVITAMIN) tablet Take 1 tablet by mouth daily.   Yes [provider]  nitroGLYCERIN (NITROLINGUAL) 0.4 MG/SPRAY spray Place 1 spray under the tongue every 5 (five) minutes x 3 doses as needed for chest pain (Call 911 if taking 3rd spray). 01/27/16  Yes Iran OuchArida, Muhammad A, MD  rosuvastatin (CRESTOR) 40 MG tablet TAKE 1 TABLET BY MOUTH ONCE DAILY (INCREASED  DOSE) Patient taking differently: Take 40 mg by mouth  daily.  03/01/18  Yes Reather LittlerKumar, Ajay, MD  TRESIBA FLEXTOUCH 100 UNIT/ML SOPN FlexTouch Pen INJECT 11 UNITS INTO THE SKIN DAILY. Patient taking differently: Inject 11 Units into the skin daily.  06/21/17  Yes Reather LittlerKumar, Ajay, MD  Continuous Blood Gluc Receiver (FREESTYLE LIBRE READER) DEVI 1 Device by Does not apply route every 30 (thirty) days. 03/30/17   Reather LittlerKumar, Ajay, MD  Continuous Blood Gluc Sensor (FREESTYLE LIBRE SENSOR SYSTEM) MISC Inject 3 Devices into the skin every 14 (fourteen) days. Dx code- E10.65 NDC- [id number] 05/11/16   Reather LittlerKumar, Ajay, MD  Insulin Disposable Pump (OMNIPOD DASH 5 PACK) MISC 1 each by Does not apply route every 3 (three) days. Use as directed. 04/06/18   Reather LittlerKumar, Ajay, MD    Inpatient Medications: Scheduled Meds: . aspirin  324 mg Oral Once  . aspirin EC  81 mg Oral Daily  . carvedilol  6.25 mg Oral BID WC  . enoxaparin (LOVENOX) injection  40 mg Subcutaneous QHS  . insulin aspart  2-15 Units Subcutaneous TID WC  . insulin glargine  11 Units Subcutaneous Daily  . isosorbide mononitrate  30 mg Oral Daily  . isosorbide mononitrate  60 mg Oral QHS  . multivitamin with minerals  1 tablet Oral Daily  . rosuvastatin  40 mg Oral Daily   Continuous Infusions: . sodium chloride 75 mL/hr at 04/16/18 0248   PRN Meds: acetaminophen, ondansetron (ZOFRAN) IV  Allergies:   No Known Allergies  Social History:   Social History   Socioeconomic History  . Marital status: Widowed    Spouse name: Not on file  . Number of children: Not on file  . Years of education: Not on file  . Highest education level: Not on file  Occupational History  . Occupation: Retired  Engineer, productionocial Needs  . Financial resource strain: Not on file  . Food insecurity:    Worry: Not on file    Inability: Not on file  . Transportation needs:    Medical: Not on file    Non-medical: Not on file  Tobacco Use  . Smoking status: Never Smoker  . Smokeless tobacco: Never Used  Substance and Sexual Activity  .  Alcohol use: No  . Drug use: Not on file  . Sexual activity: Not on file  Lifestyle  . Physical activity:    Days per week: Not on file    Minutes per session: Not on file  . Stress: Not on file  Relationships  . Social connections:    Talks on phone: Not on file    Gets together: Not on file    Attends religious service: Not on file    Active member of club or organization: Not on file    Attends meetings of clubs or organizations: Not  on file    Relationship status: Not on file  . Intimate partner violence:    Fear of current or ex partner: Not on file    Emotionally abused: Not on file    Physically abused: Not on file    Forced sexual activity: Not on file  Other Topics Concern  . Not on file  Social History Narrative   Pt lives w/ son, dtr-in-law and grandchildren.     Family History:   Family History  Problem Relation Age of Onset  . Breast cancer Mother 91  . Stroke Mother   . Emphysema Father 29  . Heart attack Father    Family Status:  Family Status  Relation Name Status  . Mother  Deceased  . Father  Deceased  . Sister  Alive    ROS:  Please see the history of present illness.  All other ROS reviewed and negative.     Physical Exam/Data:   Vitals:   04/16/18 0715 04/16/18 0730 04/16/18 0819 04/16/18 0822  BP: (!) 115/49 (!) 123/50 (!) 166/65 (!) 166/65  Pulse: 74 75 82 81  Resp: 14 16 16 20   Temp:   98.4 F (36.9 C) 98.4 F (36.9 C)  TempSrc:   Oral Oral  SpO2: 96% 96% 97% 98%  Weight:   73.3 kg   Height:   5\' 4"  (1.626 m)    No intake or output data in the 24 hours ending 04/16/18 0939 Filed Weights   04/16/18 0819  Weight: 73.3 kg   Body mass index is 27.74 kg/m.  General:  Well nourished, well developed, elderly female in no acute distress HEENT: normal Lymph: no adenopathy Neck: no JVD Endocrine:  No thryomegaly Vascular: No carotid bruits; 3/4 extremity pulses 2+, RLE DP pulse slightly decreased  Cardiac:  normal S1, S2; RRR; no  murmur Lungs:  clear to auscultation bilaterally, no wheezing, rhonchi or rales  Abd: soft, nontender, no hepatomegaly  Ext: no edema Musculoskeletal:  No deformities, BUE and BLE strength normal and equal Skin: warm and dry  Neuro:  CNs 2-12 intact, no focal abnormalities noted Psych:  Normal affect   EKG:  The EKG was personally reviewed and demonstrates:  12/29, SR, HR 75, no acute ischemic changes Telemetry:  Telemetry was personally reviewed and demonstrates:  SR, rare PACs  Relevant CV Studies:  ECHO: none  CATH: in [place] in 2013, occluded LAD/RCA, CFX patent, grafts patent but grafted OM 100% distally  Laboratory Data:  Chemistry Recent Labs  Lab 04/15/18 2011 04/16/18 0501  NA 138 138  K 4.5 3.9  CL 101 103  CO2 30 25  GLUCOSE 128* 150*  BUN 33* 28*  CREATININE 1.57* 1.02*  CALCIUM 9.7 9.0  GFRNONAA 32* 53*  GFRAA 37* >60  ANIONGAP 7 10    Lab Results  Component Value Date   ALT 23 04/16/2018   AST 22 04/16/2018   ALKPHOS 75 04/16/2018   BILITOT 0.3 04/16/2018   Hematology Recent Labs  Lab 04/15/18 2011  WBC 5.5  RBC 3.59*  HGB 11.8*  HCT 36.5  MCV 101.7*  MCH 32.9  MCHC 32.3  RDW 13.3  PLT 168   Cardiac Enzymes Recent Labs  Lab 04/16/18 0245 04/16/18 0501  TROPONINI <0.03 <0.03    Recent Labs  Lab 04/15/18 2008  TROPIPOC 0.01    TSH:  Lab Results  Component Value Date   TSH 2.53 05/04/2016   Lipids: Lab Results  Component Value Date  CHOL 131 02/20/2018   HDL 78.90 02/20/2018   LDLCALC 42 02/20/2018   TRIG 47.0 02/20/2018   CHOLHDL 2 02/20/2018   HgbA1c: Lab Results  Component Value Date   HGBA1C 7.6 (H) 02/20/2018    Radiology/Studies:  Dg Chest 2 View  Result Date: 04/15/2018 CLINICAL DATA:  Left chest pain EXAM: CHEST - 2 VIEW COMPARISON:  None. FINDINGS: Patient status post prior median sternotomy and CABG. The heart size is normal. Both lungs are clear. Degenerative joint changes of the spine are noted.  IMPRESSION: No active cardiopulmonary disease. Electronically Signed   By: Sherian Rein M.D.   On: 04/15/2018 20:54    Assessment and Plan:   Principal Problem: 1.  Chest pain, rule out acute myocardial infarction - ez neg MI, ECG not acute - however, she has been having CP 2 x week, related to exertion or emotional stress. - she is on good therapy w/ ASA, BB, Zetia, Crestor, Imdur - discuss w/ MD if MV or cath is best option.   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   Signed, Theodore Demark, PA-C  04/16/2018 9:39 AM  I have seen and examined the patient along with Theodore Demark, PA-C.  I have reviewed the chart, notes and new data.  I agree with PA/NP's note.  Key new complaints: Chest discomfort was significant and prolonged (approximately 2 hours) but different from her previous angina.  She has often associated this type of chest discomfort with periods of anxiety. Key examination changes: Normal cardiovascular exam except for presence of sternotomy scar Key new findings / data: No evidence of acute repolarization changes on ECG.  3 consecutive normal troponin assays, most recent one performed roughly 10 hours after symptom onset.  Excellent LDL 42.  Blood pressure generally well controlled.  PLAN: I think Natasha Hernandez is at low risk for an acute coronary syndrome in the next couple of weeks (currently asymptomatic, normal ECG, normal cardiac enzymes.  However, she is more than 20 years status post bypass surgery and has not had a formal evaluation of her coronary status since cardiac catheterization in 2013.  Would recommend outpatient nuclear stress test with  follow-up in the clinic after that is performed.  Meanwhile continue aspirin, statin, beta-blocker and seek emergency attention for chest discomfort lasting more than 30 minutes.  Thurmon Fair, MD, [facility] [phone] 04/16/2018, 10:58 AM

## 2018-04-16 NOTE — Progress Notes (Signed)
Discharge instructions reviewed with patient and family.  These included, but were not limited to, the following:  Medications, follow-up MD appointments, when to call the MD, etc, were discussed with patient and her daughter.  Patient stated she was not willing to wait for further testing to be completed as she [place].  Denied C/P.  Patient did have noticeable 2+ BLE edema.  She stated she had "gained some weight" over the past several weeks, but was not able to be specific for the amount.

## 2018-04-16 NOTE — Discharge Summary (Signed)
Physician Discharge Summary  Natasha Hernandez ZOX:[id number] DOB: 09/29/1940 DOA: 04/15/2018  PCP: Patient, No Pcp Per PCP was offered to the patient but the daughter said they would arrange their own  Admit date: 04/15/2018 Discharge date: 04/16/2018  Admitted From:  Disposition: Home  Recommendations for Outpatient Follow-up:  1. Follow up with Dr. Shirline FreesMohammed from cardiology tomorrow please call the office for scheduling an outpatient stress test  Home Health: No Equipment/Devices: None  Discharge Condition: Stable CODE STATUS: Full code Diet recommendation: Heart Healthy /carb modified  Brief/Interim Summary: Patient presents to the ED with c/o CP.  Symptoms onset over last several months.  Progressively worsening.  Sharp, left sided, radiates twords back.  Associated with stress and movement around house, resolves with rest.  No SOB, diaphoresis, nausea, vomiting.  Evaluation in the emergency department was unrevealing.  Patient placed on observation to rule out myocardial infarction.  She was seen in consultation by cardiology.  Cardiology recommended "Natasha Hernandez is at low risk for an acute coronary syndrome in the next couple of weeks (currently asymptomatic, normal ECG, normal cardiac enzymes.  However, she is more than 20 years status post bypass surgery and has not had a formal evaluation of her coronary status since cardiac catheterization in 2013.  Would recommend outpatient nuclear stress test with follow-up in the clinic after that is performed.  Meanwhile continue aspirin, statin, beta-blocker and seek emergency attention for chest discomfort lasting more than 30 minutes." Patient has reached maximal benefit of hospitalization.  Discharge diagnosis, prognosis, plans, follow-up, medications and treatments discussed with the patient(or responsible party) and is in agreement with the plans as described.  Patient is stable for discharge.  Discharge Diagnoses:  Principal  Problem:   Chest pain, rule out acute myocardial infarction Active Problems:   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)   Essential hypertension   Hx of CABG   AKI (acute kidney injury) [facility](HCC)    Discharge Instructions  Discharge Instructions    Diet - low sodium heart healthy   Complete by:  As directed    Increase activity slowly   Complete by:  As directed      Allergies as of 04/16/2018   No Known Allergies     Medication List    TAKE these medications   aspirin EC 81 MG tablet Take 1 tablet (81 mg total) by mouth daily.   CALCIUM MAGNESIUM PO Take 1 tablet by mouth daily.   carvedilol 6.25 MG tablet Commonly known as:  COREG Take 1 tablet (6.25 mg total) by mouth 2 (two) times daily with a meal.   CO Q 10 PO Take 1 capsule by mouth daily.   ezetimibe 10 MG tablet Commonly known as:  ZETIA Take 1 tablet (10 mg total) by mouth daily.   FREESTYLE LIBRE READER Devi 1 Device by Does not apply route every 30 (thirty) days.   FREESTYLE LIBRE SENSOR SYSTEM Misc Inject 3 Devices into the skin every 14 (fourteen) days. Dx code- E10.65 NDC- [id number]   insulin aspart 100 UNIT/ML FlexPen Commonly known as:  NOVOLOG FLEXPEN Inject 2-15 Units into the skin 3 (three) times daily before meals. Sliding scale: 0-150 equals 2 units, increase 1 units for every 50 blood sugar reading after 150. Formula for carbs is number of carbs divided by 5.   isosorbide mononitrate 60 MG 24 hr tablet Commonly known as:  IMDUR Take 0.5 tablets (30 mg total) by mouth every morning. TAke 1 tablet  by mouth every evening What changed:    how much to take  when to take this  additional instructions   lisinopril 20 MG tablet Commonly known as:  PRINIVIL,ZESTRIL Take 1 tablet (20 mg total) by mouth daily.   multivitamin tablet Take 1 tablet by mouth daily.   nitroGLYCERIN 0.4 MG/SPRAY spray Commonly known as:  NITROLINGUAL Place 1 spray under  the tongue every 5 (five) minutes x 3 doses as needed for chest pain (Call 911 if taking 3rd spray).   OMNIPOD DASH 5 PACK Misc 1 each by Does not apply route every 3 (three) days. Use as directed.   rosuvastatin 40 MG tablet Commonly known as:  CRESTOR TAKE 1 TABLET BY MOUTH ONCE DAILY (INCREASED  DOSE) What changed:  See the new instructions.   TRESIBA FLEXTOUCH 100 UNIT/ML Sopn FlexTouch Pen Generic drug:  insulin degludec INJECT 11 UNITS INTO THE SKIN DAILY. What changed:  See the new instructions.      Follow-up Information    Iran OuchArida, Muhammad A, MD. Call in 1 day(s).   Specialty:  Cardiology Why:  needs outpt stress test after OBServation stay in hospital Contact information: [address] [place] [address] [phone]          No Known Allergies  Consultations: Cardiology Dr. Franciso BendQuatro  Procedures/Studies: Dg Chest 2 View  Result Date: 04/15/2018 CLINICAL DATA:  Left chest pain EXAM: CHEST - 2 VIEW COMPARISON:  None. FINDINGS: Patient status post prior median sternotomy and CABG. The heart size is normal. Both lungs are clear. Degenerative joint changes of the spine are noted. IMPRESSION: No active cardiopulmonary disease. Electronically Signed   By: Sherian ReinWei-Chen  Lin M.D.   On: 04/15/2018 20:54       Subjective: Denies chest pain wants to go home  Discharge Exam: Vitals:   04/16/18 0819 04/16/18 0822  BP: (!) 166/65 (!) 166/65  Pulse: 82 81  Resp: 16 20  Temp: 98.4 F (36.9 C) 98.4 F (36.9 C)  SpO2: 97% 98%   Vitals:   04/16/18 0715 04/16/18 0730 04/16/18 0819 04/16/18 0822  BP: (!) 115/49 (!) 123/50 (!) 166/65 (!) 166/65  Pulse: 74 75 82 81  Resp: 14 16 16 20   Temp:   98.4 F (36.9 C) 98.4 F (36.9 C)  TempSrc:   Oral Oral  SpO2: 96% 96% 97% 98%  Weight:   73.3 kg   Height:   5\' 4"  (1.626 m)     General: Pt is alert, awake, not in acute distress Cardiovascular: RRR, S1/S2 +, no rubs, no gallops Respiratory: CTA  bilaterally, no wheezing, no rhonchi Abdominal: Soft, NT, ND, bowel sounds + Extremities: no edema, no cyanosis    The results of significant diagnostics from this hospitalization (including imaging, microbiology, ancillary and laboratory) are listed below for reference.     Microbiology: No results found for this or any previous visit (from the past 240 hour(s)).   Labs: BNP (last 3 results) No results for input(s): BNP in the last 8760 hours. Basic Metabolic Panel: Recent Labs  Lab 04/15/18 2011 04/16/18 0501  NA 138 138  K 4.5 3.9  CL 101 103  CO2 30 25  GLUCOSE 128* 150*  BUN 33* 28*  CREATININE 1.57* 1.02*  CALCIUM 9.7 9.0   Liver Function Tests: Recent Labs  Lab 04/16/18 0245  AST 22  ALT 23  ALKPHOS 75  BILITOT 0.3  PROT 6.2*  ALBUMIN 3.9   No results for input(s): LIPASE,  AMYLASE in the last 168 hours. No results for input(s): AMMONIA in the last 168 hours. CBC: Recent Labs  Lab 04/15/18 2011  WBC 5.5  HGB 11.8*  HCT 36.5  MCV 101.7*  PLT 168   Cardiac Enzymes: Recent Labs  Lab 04/16/18 0245 04/16/18 0501  TROPONINI <0.03 <0.03   BNP: Invalid input(s): POCBNP CBG: No results for input(s): GLUCAP in the last 168 hours. D-Dimer No results for input(s): DDIMER in the last 72 hours. Hgb A1c No results for input(s): HGBA1C in the last 72 hours. Lipid Profile No results for input(s): CHOL, HDL, LDLCALC, TRIG, CHOLHDL, LDLDIRECT in the last 72 hours. Thyroid function studies No results for input(s): TSH, T4TOTAL, T3FREE, THYROIDAB in the last 72 hours.  Invalid input(s): FREET3 Anemia work up No results for input(s): VITAMINB12, FOLATE, FERRITIN, TIBC, IRON, RETICCTPCT in the last 72 hours. Urinalysis    Component Value Date/Time   COLORURINE YELLOW 05/04/2016 1535   APPEARANCEUR CLEAR 05/04/2016 1535   LABSPEC 1.020 05/04/2016 1535   PHURINE 6.0 05/04/2016 1535   GLUCOSEU 100 (A) 05/04/2016 1535   HGBUR NEGATIVE 05/04/2016 1535    BILIRUBINUR NEGATIVE 05/04/2016 1535   KETONESUR TRACE (A) 05/04/2016 1535   UROBILINOGEN 0.2 05/04/2016 1535   NITRITE NEGATIVE 05/04/2016 1535   LEUKOCYTESUR NEGATIVE 05/04/2016 1535   Sepsis Labs Invalid input(s): PROCALCITONIN,  WBC,  LACTICIDVEN Microbiology No results found for this or any previous visit (from the past 240 hour(s)).   Time coordinating discharge: 5  SIGNED:   Lahoma Crocker, MD  FACP [facility] 04/16/2018, 12:28 PM Pager   If 7PM-7AM, please contact night-coverage [email or web address] Password [email or web address]

## 2018-04-16 NOTE — H&P (Signed)
History and Physical    Natasha Hernandez OZH:[id number] DOB: 05/26/1940 DOA: 04/15/2018  PCP: Patient, No Pcp Per  Patient coming from: Home  I have personally briefly reviewed patient's old medical records in [facility]  Chief Complaint: CP  HPI: Natasha Hernandez is a 77 y.o. female with medical history significant of DM1, HTN, HLD, CAD s/p CABG 1996, Patent grafts on cath in 2013.  Patient presents to the ED with c/o CP.  Symptoms onset over last several months.  Progressively worsening.  Sharp, left sided, radiates twords back.  Associated with stress and movement around house, resolves with rest.  No SOB, diaphoresis, nausea, vomiting.   ED Course: CP free currently.  Trop neg.  Creat up to 1.5 from baseline 0.9 last month.   Review of Systems: As per HPI otherwise 10 point review of systems negative.   Past Medical History:  Diagnosis Date  . Carotid artery occlusion   . Coronary artery disease    CABG in 1996. Most recent cardiac catheterization in 2013 showed patent grafts including LIMA to LAD, SVG to OM, SVG to diagonal and SVG to PDA.  . Diabetes mellitus without complication (HCC)   . Hyperlipidemia   . Hypertension   . PAD (peripheral artery disease) (HCC)     Past Surgical History:  Procedure Laterality Date  . CORONARY ARTERY BYPASS GRAFT  1996     reports that she has never smoked. She has never used smokeless tobacco. She reports that she does not drink alcohol. No history on file for drug.  No Known Allergies  No family history on file.   Prior to Admission medications   Medication Sig Start Date End Date Taking? Authorizing Provider  aspirin EC 81 MG tablet Take 1 tablet (81 mg total) by mouth daily. 03/25/17  Yes Iran OuchArida, Muhammad A, MD  Calcium-Magnesium-Vitamin D (CALCIUM MAGNESIUM PO) Take 1 tablet by mouth daily.   Yes [provider]  carvedilol (COREG) 6.25 MG tablet Take 1 tablet (6.25 mg total) by mouth 2 (two) times daily  with a meal. 02/22/18  Yes Reather LittlerKumar, Ajay, MD  Coenzyme Q10 (CO Q 10 PO) Take 1 capsule by mouth daily.   Yes [provider]  ezetimibe (ZETIA) 10 MG tablet Take 1 tablet (10 mg total) by mouth daily. 02/22/18  Yes Reather LittlerKumar, Ajay, MD  insulin aspart (NOVOLOG FLEXPEN) 100 UNIT/ML FlexPen Inject 2-15 Units into the skin 3 (three) times daily before meals. Sliding scale: 0-150 equals 2 units, increase 1 units for every 50 blood sugar reading after 150. Formula for carbs is number of carbs divided by 5. 05/23/17  Yes Reather LittlerKumar, Ajay, MD  isosorbide mononitrate (IMDUR) 60 MG 24 hr tablet Take 0.5 tablets (30 mg total) by mouth every morning. TAke 1 tablet by mouth every evening Patient taking differently: Take 30-60 mg by mouth See admin instructions. Take 1/2 tablet every morning and take 1 tablet every evening 02/22/18  Yes Reather LittlerKumar, Ajay, MD  lisinopril (PRINIVIL,ZESTRIL) 20 MG tablet Take 1 tablet (20 mg total) by mouth daily. 02/22/18  Yes Reather LittlerKumar, Ajay, MD  Multiple Vitamin (MULTIVITAMIN) tablet Take 1 tablet by mouth daily.   Yes [provider]  nitroGLYCERIN (NITROLINGUAL) 0.4 MG/SPRAY spray Place 1 spray under the tongue every 5 (five) minutes x 3 doses as needed for chest pain (Call 911 if taking 3rd spray). 01/27/16  Yes Iran OuchArida, Muhammad A, MD  rosuvastatin (CRESTOR) 40 MG tablet TAKE 1 TABLET BY MOUTH ONCE DAILY (INCREASED  DOSE) Patient taking differently: Take 40 mg by mouth daily.  03/01/18  Yes Reather LittlerKumar, Ajay, MD  TRESIBA FLEXTOUCH 100 UNIT/ML SOPN FlexTouch Pen INJECT 11 UNITS INTO THE SKIN DAILY. Patient taking differently: Inject 11 Units into the skin daily.  06/21/17  Yes Reather LittlerKumar, Ajay, MD  Continuous Blood Gluc Receiver (FREESTYLE LIBRE READER) DEVI 1 Device by Does not apply route every 30 (thirty) days. 03/30/17   Reather LittlerKumar, Ajay, MD  Continuous Blood Gluc Sensor (FREESTYLE LIBRE SENSOR SYSTEM) MISC Inject 3 Devices into the skin every 14 (fourteen) days. Dx code- E10.65 NDC- [id number] 05/11/16    Reather LittlerKumar, Ajay, MD  Insulin Disposable Pump (OMNIPOD DASH 5 PACK) MISC 1 each by Does not apply route every 3 (three) days. Use as directed. 04/06/18   Reather LittlerKumar, Ajay, MD    Physical Exam: Vitals:   04/16/18 0100 04/16/18 0115 04/16/18 0130 04/16/18 0145  BP: (!) 145/74 (!) 132/58 (!) 125/53 (!) 136/51  Pulse: 72 72 71 72  Resp: 20 18 16 12   Temp:      TempSrc:      SpO2: 98% 98% 96% 97%    Constitutional: NAD, calm, comfortable Eyes: PERRL, lids and conjunctivae normal ENMT: Mucous membranes are moist. Posterior pharynx clear of any exudate or lesions.Normal dentition.  Neck: normal, supple, no masses, no thyromegaly Respiratory: clear to auscultation bilaterally, no wheezing, no crackles. Normal respiratory effort. No accessory muscle use.  Cardiovascular: Regular rate and rhythm, no murmurs / rubs / gallops. No extremity edema. 2+ pedal pulses. No carotid bruits.  Abdomen: no tenderness, no masses palpated. No hepatosplenomegaly. Bowel sounds positive.  Musculoskeletal: no clubbing / cyanosis. No joint deformity upper and lower extremities. Good ROM, no contractures. Normal muscle tone.  Skin: no rashes, lesions, ulcers. No induration Neurologic: CN 2-12 grossly intact. Sensation intact, DTR normal. Strength 5/5 in all 4.  Psychiatric: Normal judgment and insight. Alert and oriented x 3. Normal mood.    Labs on Admission: I have personally reviewed following labs and imaging studies  CBC: Recent Labs  Lab 04/15/18 2011  WBC 5.5  HGB 11.8*  HCT 36.5  MCV 101.7*  PLT 168   Basic Metabolic Panel: Recent Labs  Lab 04/15/18 2011  NA 138  K 4.5  CL 101  CO2 30  GLUCOSE 128*  BUN 33*  CREATININE 1.57*  CALCIUM 9.7   GFR: CrCl cannot be calculated (Unknown ideal weight.). Liver Function Tests: No results for input(s): AST, ALT, ALKPHOS, BILITOT, PROT, ALBUMIN in the last 168 hours. No results for input(s): LIPASE, AMYLASE in the last 168 hours. No results for  input(s): AMMONIA in the last 168 hours. Coagulation Profile: No results for input(s): INR, PROTIME in the last 168 hours. Cardiac Enzymes: No results for input(s): CKTOTAL, CKMB, CKMBINDEX, TROPONINI in the last 168 hours. BNP (last 3 results) No results for input(s): PROBNP in the last 8760 hours. HbA1C: No results for input(s): HGBA1C in the last 72 hours. CBG: No results for input(s): GLUCAP in the last 168 hours. Lipid Profile: No results for input(s): CHOL, HDL, LDLCALC, TRIG, CHOLHDL, LDLDIRECT in the last 72 hours. Thyroid Function Tests: No results for input(s): TSH, T4TOTAL, FREET4, T3FREE, THYROIDAB in the last 72 hours. Anemia Panel: No results for input(s): VITAMINB12, FOLATE, FERRITIN, TIBC, IRON, RETICCTPCT in the last 72 hours. Urine analysis:    Component Value Date/Time   COLORURINE YELLOW 05/04/2016 1535   APPEARANCEUR CLEAR 05/04/2016 1535   LABSPEC 1.020 05/04/2016 1535   PHURINE 6.0 05/04/2016  1535   GLUCOSEU 100 (A) 05/04/2016 1535   HGBUR NEGATIVE 05/04/2016 1535   BILIRUBINUR NEGATIVE 05/04/2016 1535   KETONESUR TRACE (A) 05/04/2016 1535   UROBILINOGEN 0.2 05/04/2016 1535   NITRITE NEGATIVE 05/04/2016 1535   LEUKOCYTESUR NEGATIVE 05/04/2016 1535    Radiological Exams on Admission: Dg Chest 2 View  Result Date: 04/15/2018 CLINICAL DATA:  Left chest pain EXAM: CHEST - 2 VIEW COMPARISON:  None. FINDINGS: Patient status post prior median sternotomy and CABG. The heart size is normal. Both lungs are clear. Degenerative joint changes of the spine are noted. IMPRESSION: No active cardiopulmonary disease. Electronically Signed   By: Sherian Rein M.D.   On: 04/15/2018 20:54    EKG: Independently reviewed.  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 CP r/o - 1. CP obs  pathway 2. Serial trops 3. Tele monitor 4. Patient would prefer not to be made NPO since long acting insulin on board and CBG will keep decreasing.  Will go ahead and let eat carb mod diet for now. 5. Cards eval in AM 6. Will also order LFT 2. AKI - Mild 1. Holding ACEi 2. IVF: NS at 76 3. Repeat BMP in AM 3. DM1 - 1. Continue Tresiba 11u Q 8am 2. Continue TID AC novolog, patient carb counts it looks like 3. CBG checks AC/HS 4. HTN - 1. Holding ACEi given mild AKI 2. Cont Coreg 3. Cont Imdur  DVT prophylaxis: Lovenox Code Status: Full Family Communication: Family at bedside Disposition Plan: Home after admit Consults called: Message sent to P. Trent for routine cards eval in AM Admission status: Place in [place], [place]. DO [facility] Pager [phone] Only works nights!  If 7AM-7PM, please contact the primary day team physician taking care of patient  [email or web address] Password [facility]  04/16/2018, 2:24 AM

## 2018-04-16 NOTE — ED Provider Notes (Signed)
[facility] EMERGENCY DEPARTMENT Provider Note   CSN: [id number] Arrival date & time: 04/15/18  1942     History   Chief Complaint Chief Complaint  Patient presents with  . Chest Pain    HPI Natasha Hernandez is a 77 y.o. female.  HPI   77 yo F with PMHx CAD, CABG, T1DM, PAD here w/ chest pain. Pt reports that over the last several months, she's had progressively worsening episodes of sharp left-sided CP that radiates toward her back. It seems to be associated with stress and movement around the house, then resolves w/ rest. No SOB, diaphoresis, nausea, vomiting. Sx increasingly frequent and severe so she presents for eval. CP free currently. Denies any other complaints. No leg swelling. Has been taking her meds as prescribed.  Past Medical History:  Diagnosis Date  . Carotid artery occlusion   . Coronary artery disease    CABG in 1996. Most recent cardiac catheterization in 2013 showed patent grafts including LIMA to LAD, SVG to OM, SVG to diagonal and SVG to PDA.  . Diabetes mellitus without complication (HCC)   . Hyperlipidemia   . Hypertension   . PAD (peripheral artery disease) [facility])     Patient Active Problem List   Diagnosis Date Noted  . CAD (coronary artery disease) 09/27/2016  . Hx of CABG 09/27/2016  . Right carotid artery occlusion 09/27/2016  . PAD (peripheral artery disease) (HCC) 09/27/2016  . Essential hypertension 07/10/2016  . Uncontrolled type 1 diabetes mellitus with hyperglycemia (HCC) 05/07/2016  . Pure hypercholesterolemia 05/07/2016  . 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 CORONARY ARTERY BYPASS GRAFT  1996     OB History   No obstetric history on file.      Home Medications    Prior to Admission medications   Medication Sig Start Date End Date Taking? Authorizing Provider  aspirin EC 81 MG tablet Take 1  tablet (81 mg total) by mouth daily. 03/25/17  Yes Iran Ouch, MD  Calcium-Magnesium-Vitamin D (CALCIUM MAGNESIUM PO) Take 1 tablet by mouth daily.   Yes [provider]  carvedilol (COREG) 6.25 MG tablet Take 1 tablet (6.25 mg total) by mouth 2 (two) times daily with a meal. 02/22/18  Yes Reather Littler, MD  Coenzyme Q10 (CO Q 10 PO) Take 1 capsule by mouth daily.   Yes [provider]  ezetimibe (ZETIA) 10 MG tablet Take 1 tablet (10 mg total) by mouth daily. 02/22/18  Yes Reather Littler, MD  insulin aspart (NOVOLOG FLEXPEN) 100 UNIT/ML FlexPen Inject 2-15 Units into the skin 3 (three) times daily before meals. Sliding scale: 0-150 equals 2 units, increase 1 units for every 50 blood sugar reading after 150. Formula for carbs is number of carbs divided by 5. 05/23/17  Yes Reather Littler, MD  isosorbide mononitrate (IMDUR) 60 MG 24 hr tablet Take 0.5 tablets (30 mg total) by mouth every morning. TAke 1 tablet by mouth every evening Patient taking differently: Take 30-60 mg by mouth See admin instructions. Take 1/2 tablet every morning and take 1 tablet every evening 02/22/18  Yes Reather Littler, MD  lisinopril (PRINIVIL,ZESTRIL) 20 MG tablet Take 1 tablet (20 mg total) by mouth daily. 02/22/18  Yes Reather Littler, MD  Multiple Vitamin (MULTIVITAMIN) tablet Take 1 tablet by mouth daily.   Yes [provider]  nitroGLYCERIN (NITROLINGUAL) 0.4 MG/SPRAY spray Place 1 spray  under the tongue every 5 (five) minutes x 3 doses as needed for chest pain (Call 911 if taking 3rd spray). 01/27/16  Yes Iran OuchArida, Muhammad A, MD  rosuvastatin (CRESTOR) 40 MG tablet TAKE 1 TABLET BY MOUTH ONCE DAILY (INCREASED  DOSE) Patient taking differently: Take 40 mg by mouth daily.  03/01/18  Yes Reather LittlerKumar, Ajay, MD  TRESIBA FLEXTOUCH 100 UNIT/ML SOPN FlexTouch Pen INJECT 11 UNITS INTO THE SKIN DAILY. Patient taking differently: Inject 11 Units into the skin daily.  06/21/17  Yes Reather LittlerKumar, Ajay, MD  Continuous Blood Gluc Receiver  (FREESTYLE LIBRE READER) DEVI 1 Device by Does not apply route every 30 (thirty) days. 03/30/17   Reather LittlerKumar, Ajay, MD  Continuous Blood Gluc Sensor (FREESTYLE LIBRE SENSOR SYSTEM) MISC Inject 3 Devices into the skin every 14 (fourteen) days. Dx code- E10.65 NDC- [id number] 05/11/16   Reather LittlerKumar, Ajay, MD  Insulin Disposable Pump (OMNIPOD DASH 5 PACK) MISC 1 each by Does not apply route every 3 (three) days. Use as directed. 04/06/18   Reather LittlerKumar, Ajay, MD    Family History No family history on file.  Social History Social History   Tobacco Use  . Smoking status: Never Smoker  . Smokeless tobacco: Never Used  Substance Use Topics  . Alcohol use: No  . Drug use: Not on file     Allergies   Patient has no known allergies.   Review of Systems Review of Systems  Constitutional: Positive for fatigue. Negative for chills and fever.  HENT: Negative for congestion, rhinorrhea and sore throat.   Eyes: Negative for visual disturbance.  Respiratory: Positive for chest tightness. Negative for cough, shortness of breath and wheezing.   Cardiovascular: Positive for chest pain. Negative for leg swelling.  Gastrointestinal: Negative for abdominal pain, diarrhea, nausea and vomiting.  Genitourinary: Negative for dysuria, flank pain, vaginal bleeding and vaginal discharge.  Musculoskeletal: Negative for neck pain.  Skin: Negative for rash.  Allergic/Immunologic: Negative for immunocompromised state.  Neurological: Negative for syncope and headaches.  Hematological: Does not bruise/bleed easily.  All other systems reviewed and are negative.    Physical Exam Updated Vital Signs BP (!) 171/71 (BP Location: Right Arm)   Pulse 75   Temp 98.4 F (36.9 C) (Oral)   Resp 16   SpO2 95%   Physical Exam Vitals signs and nursing note reviewed.  Constitutional:      General: She is not in acute distress.    Appearance: She is well-developed.  HENT:     Head: Normocephalic and atraumatic.  Eyes:      Conjunctiva/sclera: Conjunctivae normal.  Neck:     Musculoskeletal: Neck supple.  Cardiovascular:     Rate and Rhythm: Normal rate and regular rhythm.     Heart sounds: Normal heart sounds. No murmur. No friction rub.  Pulmonary:     Effort: Pulmonary effort is normal. No respiratory distress.     Breath sounds: Normal breath sounds. No wheezing or rales.  Abdominal:     General: There is no distension.     Palpations: Abdomen is soft.     Tenderness: There is no abdominal tenderness.  Skin:    General: Skin is warm.     Capillary Refill: Capillary refill takes less than 2 seconds.  Neurological:     Mental Status: She is alert and oriented to person, place, and time.     Motor: No abnormal muscle tone.      ED Treatments / Results  Labs (all labs ordered are  listed, but only abnormal results are displayed) Labs Reviewed  BASIC METABOLIC PANEL - Abnormal; Notable for the following components:      Result Value   Glucose, Bld 128 (*)    BUN 33 (*)    Creatinine, Ser 1.57 (*)    GFR calc non Af Amer 32 (*)    GFR calc Af Amer 37 (*)    All other components within normal limits  CBC - Abnormal; Notable for the following components:   RBC 3.59 (*)    Hemoglobin 11.8 (*)    MCV 101.7 (*)    All other components within normal limits  I-STAT TROPONIN, ED    EKG None  Radiology Dg Chest 2 View  Result Date: 04/15/2018 CLINICAL DATA:  Left chest pain EXAM: CHEST - 2 VIEW COMPARISON:  None. FINDINGS: Patient status post prior median sternotomy and CABG. The heart size is normal. Both lungs are clear. Degenerative joint changes of the spine are noted. IMPRESSION: No active cardiopulmonary disease. Electronically Signed   By: Sherian ReinWei-Chen  Lin M.D.   On: 04/15/2018 20:54    Procedures Procedures (including critical care time)  Medications Ordered in ED Medications  aspirin chewable tablet 324 mg (324 mg Oral Not Given 04/16/18 0102)     Initial Impression / Assessment  and Plan / ED Course  I have reviewed the triage vital signs and the nursing notes.  Pertinent labs & imaging results that were available during my care of the patient were reviewed by me and considered in my medical decision making (see chart for details).     77 yo F with h/o CAD s/p CABG here w/ intermittent but increasingly frequent CP, now resolved. Labs show mild dehydration, normal trop. CXR clear. EKG non-ischemic. Given her extensive history with increasingly frequent and severe CP that resolves with rest, will plan for obs.  Final Clinical Impressions(s) / ED Diagnoses   Final diagnoses:  Atypical chest pain  S/P CABG (coronary artery bypass graft)    ED Discharge Orders    None       Shaune PollackIsaacs, Tannia Contino, MD 04/16/18 [phone]

## 2018-04-18 ENCOUNTER — Encounter: Payer: Self-pay | Admitting: Cardiovascular Disease

## 2018-04-18 ENCOUNTER — Telehealth: Payer: Self-pay

## 2018-04-18 ENCOUNTER — Telehealth: Payer: Self-pay | Admitting: Cardiovascular Disease

## 2018-04-18 DIAGNOSIS — I2510 Atherosclerotic heart disease of native coronary artery without angina pectoris: Secondary | ICD-10-CM

## 2018-04-18 DIAGNOSIS — Z951 Presence of aortocoronary bypass graft: Secondary | ICD-10-CM

## 2018-04-18 NOTE — Telephone Encounter (Signed)
Returned call to patient spoke to daughter n Social workerlaw.She stated patient was having chest pain [facility] was admitted over night.She was told to call office and schedule a stress test.Advised scheduler will call back to schedule Lexiscan myoview and follow up appointment with Dr.Arida.

## 2018-04-18 NOTE — Telephone Encounter (Signed)
-----   Message from Darrol Jumphonda G Barrett, PA-C sent at 04/16/2018 10:36 AM EST ----- Regarding: MV and f/u Seen by Dr C when in-hospital for CP  Needs MV week of Jan 7 and then f/[place] w/ Dr Kirke Cor[place]ida[place] if not appt in [place].  Thanks

## 2018-04-18 NOTE — Telephone Encounter (Signed)
New message   Per the discharge paperwork [facility] on 04/16/2018, the patient needs an order put in the system for a stress test and then a f/u visit. Please contact the patient with this information.

## 2018-04-18 NOTE — Telephone Encounter (Signed)
Contacted patient but mailbox was full, unable to leave voicemail. Will try to call at a later time. Target heart rate is 122  (patient needs Lexiscan for chest pain, needs to be completed soon before [place] goes to [place] winter.  Lexiscan instructions:  Dx: moderate chest pain, patient can exercise Hold Novolog-pre-meal insulin the morning of test HOLD Tresiba until after test  Disposal insulin pump- Only take basal rate unless blood sugar is less than 100 then only take half the basal rate.  -Per Theodore Demarkhonda Barrett, PA-C)

## 2018-04-26 ENCOUNTER — Telehealth (HOSPITAL_COMMUNITY): Payer: Self-pay | Admitting: *Deleted

## 2018-04-26 NOTE — Telephone Encounter (Signed)
Patient's daughter per DPR was given detailed instructions per Myocardial Perfusion Study Information Sheet for the test on 05/01/18 at 1045. Patient notified to arrive 15 minutes early and that it is imperative to arrive on time for appointment to keep from having the test rescheduled.  If you need to cancel or reschedule your appointment, please call the office within 24 hours of your appointment. . Patient verbalized understanding.Smaran Gaus, Adelene Idler

## 2018-05-01 ENCOUNTER — Ambulatory Visit (HOSPITAL_COMMUNITY): Payer: Medicare Other | Attending: Cardiovascular Disease

## 2018-05-01 VITALS — Ht 64.0 in | Wt 161.0 lb

## 2018-05-01 DIAGNOSIS — I2510 Atherosclerotic heart disease of native coronary artery without angina pectoris: Secondary | ICD-10-CM | POA: Diagnosis not present

## 2018-05-01 DIAGNOSIS — R079 Chest pain, unspecified: Secondary | ICD-10-CM | POA: Insufficient documentation

## 2018-05-01 DIAGNOSIS — Z951 Presence of aortocoronary bypass graft: Secondary | ICD-10-CM | POA: Diagnosis not present

## 2018-05-01 LAB — MYOCARDIAL PERFUSION IMAGING
LV dias vol: 57 mL (ref 46–106)
LV sys vol: 24 mL
Peak HR: 89 {beats}/min
Rest HR: 76 {beats}/min
SDS: 2
SRS: 3
SSS: 5
TID: 0.99

## 2018-05-01 MED ORDER — REGADENOSON 0.4 MG/5ML IV SOLN
0.4000 mg | Freq: Once | INTRAVENOUS | Status: AC
  Administered 2018-05-01: 0.4 mg via INTRAVENOUS

## 2018-05-01 MED ORDER — TECHNETIUM TC 99M TETROFOSMIN IV KIT
32.6000 | PACK | Freq: Once | INTRAVENOUS | Status: AC | PRN
  Administered 2018-05-01: 32.6 via INTRAVENOUS
  Filled 2018-05-01: qty 33

## 2018-05-01 MED ORDER — TECHNETIUM TC 99M TETROFOSMIN IV KIT
10.6000 | PACK | Freq: Once | INTRAVENOUS | Status: AC | PRN
  Administered 2018-05-01: 10.6 via INTRAVENOUS
  Filled 2018-05-01: qty 11

## 2018-05-02 NOTE — Telephone Encounter (Signed)
Tried to contact patient as well as her daughter Natasha Hernandez; unable to leave messages due to both mailboxs being full.

## 2018-05-15 ENCOUNTER — Encounter: Payer: Medicare Other | Attending: Endocrinology | Admitting: Nutrition

## 2018-05-15 ENCOUNTER — Telehealth: Payer: Self-pay | Admitting: Endocrinology

## 2018-05-15 DIAGNOSIS — E1065 Type 1 diabetes mellitus with hyperglycemia: Secondary | ICD-10-CM | POA: Insufficient documentation

## 2018-05-15 NOTE — Telephone Encounter (Signed)
Called pt and she was unsure of how to do the correction bolus. OmniPod Rep was in the office at this time, and pt's daughter agreed to speak with omnipod rep and rep walked pt's daughter through this process and changed the basal rates. All changes were made.

## 2018-05-15 NOTE — Patient Instructions (Addendum)
Test blood sugars using the Josephine Igo, unless there is an arrow that is not pointing sidewise. Then test blood sugar.  Do this before meals and at bedtime.   Call office if blood sugar readings drop below 80, or above 250.

## 2018-05-15 NOTE — Telephone Encounter (Signed)
Please advise, as Cristy Folks is not in the office at this time.

## 2018-05-15 NOTE — Progress Notes (Signed)
Natasha Hernandez, her son and daughter-in-law were trained on how to use the OmniPod dash pump.  She had not taken her Evaristo Bury this morning.   Settings were put in by Daughter-in-law:  Basal rate: MN: 0.35, 6AM: 0.4, 9PM: 0.35,  I/C ratio: 6, ISF: 50, target: 120 with correction over 140, and timing 4 hours.   She re demonstrated how to give a bolus X3 correctly, and both she and her son had no final questions. We reviewed how this pump works, and how to change settings if needed.  Both the son and daughter-in--law reported good understanding of how to do this.  The felt like they did not need a call this evening, and were told to call the office if readings drop low or go high, before 5PM today.  They aggeed to do this. They were told to sign up for glooko and shown how to do this.  They agreed to do this and to give [place] their user name and pass work when they come in tomorrow. We reiviewed alerts and alarms and they had no questions.

## 2018-05-15 NOTE — Telephone Encounter (Signed)
Please confirm that she is having no difficulty with the pump working She will need to increase her basal rate up to 0.65 for a 6 AM-9 PM setting and 9 PM basal rate up to 0.45.  She will need to take a correction bolus now

## 2018-05-15 NOTE — Telephone Encounter (Signed)
High Blood Sugar w/New Pump Start patient was advised to contact Dr Lucianne Muss and/or Cristy Folks if blood sugars were low or high.  Daughter is reporting that Blood Sugars have been:  2:30 pm Blood Sugar was 342 3:30 pm Blood Sugar was 336  And IOB is .8  Please call Brandi back at [phone]

## 2018-05-16 ENCOUNTER — Ambulatory Visit (INDEPENDENT_AMBULATORY_CARE_PROVIDER_SITE_OTHER): Payer: Medicare Other | Admitting: Endocrinology

## 2018-05-16 ENCOUNTER — Encounter: Payer: Self-pay | Admitting: Endocrinology

## 2018-05-16 ENCOUNTER — Encounter: Payer: Medicare Other | Admitting: Nutrition

## 2018-05-16 DIAGNOSIS — E1065 Type 1 diabetes mellitus with hyperglycemia: Secondary | ICD-10-CM | POA: Diagnosis not present

## 2018-05-16 DIAGNOSIS — E1042 Type 1 diabetes mellitus with diabetic polyneuropathy: Secondary | ICD-10-CM

## 2018-05-16 NOTE — Progress Notes (Signed)
Discussed low blood sugar treatements and the idea of more carbs being needed with IOB.  Both patient and daughter reported good understanding of this.  Also discussed, rebounding, and the need to not take as much insulin when blood sugar goes high after a low.  She reported good understanding of this as well.  Boluses were given correctly yesterday, and we reviewed how to do a correction bolus, and high blood sugar protocol.   Per Dr. Remus BlakeKumar's order, daughter changed basal rate settings, I/C ratio settings and ISF settings with very little assistance from me.  They had no final questions, and said they did not need a call from me this evening.  Discussed that they can come in with a pod, if they would like another review of this, before doing it on their own.  She will reviewed the resource guide for how to change the pod and bring one in a day early tomorrow if she thinks they need another review.  They had no final questions.

## 2018-05-16 NOTE — Patient Instructions (Signed)
Read over resource manual and call if questions.

## 2018-05-16 NOTE — Progress Notes (Signed)
Patient ID: Natasha Hernandez, female   DOB: 05-21-1940, 78 y.o.   MRN: [id number]            Reason for Appointment : f/u for Type 1 Diabetes  History of Present Illness          Diagnosis: Type 1 diabetes mellitus, date of diagnosis: 1953          Previous history:  She thinks she has been on various insulin regimens over the years but has been on Lantus for 10-12 years along with mealtime insulin Previously followed by an endocrinologist but no records available. Her A1c has usually been around 7% reportedly and was 7.2 in 7/16   Recent history:   Her A1c is fairly stable at 7.6  INSULIN PUMP settings BASAL rate midnight = 0.35, 6 AM = 0.65.  9 PM = 0.35 with total basal 13.2 Carbohydrate coverage 1: 7, sensitivity 1: 50 and active insulin 4 hours. Blood sugar target 120-140    INSULIN regimen prior to pump: Tresiba 11 units daily, NOVOLOG carbohydrate coverage 1:5 and 1: 50 correction factor at meals  Current management, blood sugar patterns and problems identified:     She started her insulin pump yesterday midmorning  Prior to starting the pump her blood sugar patterns showed irregular overnight blood sugar trends with occasional tendency to hypoglycemia and periodically running somewhat high  Also was having sporadic rise in blood sugars in the late afternoon or evenings possibly from inadequate mealtime boluses  Although yesterday her blood sugar was about 80 before starting the pump it had gone up to 336 in the afternoon despite her taking 9.75 units bolus for her lunch  She was called on the phone yesterday and she was given instructions on how to do a correction bolus and change her basal rates  She got a correction bolus of 4.3 units and subsequently at dinnertime had another 2.3 units  However after 9 PM her blood sugars started coming down significantly to as low as 65 and she had 2 or 3 snacks to bring the blood sugar up  Blood sugar FASTING early this  morning was 112 and by breakfast and had started going up to 173  After breakfast blood sugar is 146 today with only 12 g carbohydrate  Her daughter-in-law is helping her program her pump  Glucose monitoring:  is being done several times a day         Glucometer: Freestyle libre  Blood Glucose readings and analysis as above  AVERAGE for the last 2 weeks 155 with 72 % of the readings within 70-180 target, 2% low and 26% high Highest blood sugar at bedtime and lowest blood sugar late morning  Factors causing Hypoglycemia: Overestimating mealtime coverage, exercise  Symptoms of hypoglycemia: Some weakness, confusion at times Treatment of hypoglycemia: Corn syrup or jellybeans/other sweets.  She will get 15 g of carbohydrate  Self-care: The diet that the patient has been following is: Carbohydrate counting  Mealtimes are: Breakfast AT 8.30 AM Dinner: 7.30   For breakfast will have toast with cheese or egg, sometimes cereal and egg Periodically eating out         Exercise:  Only occasionally uses. exercise bike or other activities         Dietician consultation: Most recent: Years ago .         CDE consultation: 04/28/16    Wt Readings from Last 3 Encounters:  05/01/18 161 lb (73 kg)  04/16/18 161  lb 9.6 oz (73.3 kg)  03/20/18 163 lb 6.4 oz (74.1 kg)    Lab Results  Component Value Date   HGBA1C 7.6 (H) 02/20/2018   HGBA1C 7.3 (H) 09/05/2017   HGBA1C 7.2 (H) 06/06/2017   Lab Results  Component Value Date   MICROALBUR <0.7 06/06/2017   LDLCALC 42 02/20/2018   CREATININE 1.02 (H) 04/16/2018    Lab Results  Component Value Date   MICRALBCREAT 1.1 06/06/2017   MICRALBCREAT 0.8 05/04/2016     Allergies as of 05/16/2018   No Known Allergies     Medication List       Accurate as of May 16, 2018 11:02 AM. Always use your most recent med list.        aspirin EC 81 MG tablet Take 1 tablet (81 mg total) by mouth daily.   CALCIUM MAGNESIUM PO Take 1 tablet by  mouth daily.   carvedilol 6.25 MG tablet Commonly known as:  COREG Take 1 tablet (6.25 mg total) by mouth 2 (two) times daily with a meal.   CO Q 10 PO Take 1 capsule by mouth daily.   ezetimibe 10 MG tablet Commonly known as:  ZETIA Take 1 tablet (10 mg total) by mouth daily.   FREESTYLE LIBRE READER Devi 1 Device by Does not apply route every 30 (thirty) days.   FREESTYLE LIBRE SENSOR SYSTEM Misc Inject 3 Devices into the skin every 14 (fourteen) days. Dx code- E10.65 NDC- 83254982641   insulin aspart 100 UNIT/ML FlexPen Commonly known as:  NOVOLOG FLEXPEN Inject 2-15 Units into the skin 3 (three) times daily before meals. Sliding scale: 0-150 equals 2 units, increase 1 units for every 50 blood sugar reading after 150. Formula for carbs is number of carbs divided by 5.   isosorbide mononitrate 60 MG 24 hr tablet Commonly known as:  IMDUR Take 0.5 tablets (30 mg total) by mouth every morning. TAke 1 tablet by mouth every evening   lisinopril 20 MG tablet Commonly known as:  PRINIVIL,ZESTRIL Take 1 tablet (20 mg total) by mouth daily.   multivitamin tablet Take 1 tablet by mouth daily.   nitroGLYCERIN 0.4 MG/SPRAY spray Commonly known as:  NITROLINGUAL Place 1 spray under the tongue every 5 (five) minutes x 3 doses as needed for chest pain (Call 911 if taking 3rd spray).   OMNIPOD DASH 5 PACK Misc 1 each by Does not apply route every 3 (three) days. Use as directed.   rosuvastatin 40 MG tablet Commonly known as:  CRESTOR TAKE 1 TABLET BY MOUTH ONCE DAILY (INCREASED  DOSE)   TRESIBA FLEXTOUCH 100 UNIT/ML Sopn FlexTouch Pen Generic drug:  insulin degludec INJECT 11 UNITS INTO THE SKIN DAILY.       Allergies: No Known Allergies  Past Medical History:  Diagnosis Date  . Carotid artery occlusion   . Coronary artery disease    CABG in 1996. Most recent cardiac catheterization in 2013 showed patent grafts including LIMA to LAD, SVG to OM, SVG to diagonal and SVG to  PDA.  . Diabetes mellitus without complication (HCC)   . Hyperlipidemia   . Hypertension   . PAD (peripheral artery disease) (HCC)     Past Surgical History:  Procedure Laterality Date  . CORONARY ARTERY BYPASS GRAFT  1996    Family History  Problem Relation Age of Onset  . Breast cancer Mother 62  . Stroke Mother   . Emphysema Father 43  . Heart attack Father  Social History:  reports that she has never smoked. She has never used smokeless tobacco. She reports that she does not drink alcohol. No history on file for drug.      Review of Systems  The following is a copy of the previous note:  Lipids:  She is on Crestor 40 mg daily and since 7/19 also on Zetia  Has history of CAD in the past several years ago with coronary bypass LDL is much better with this   Lab Results  Component Value Date   CHOL 131 02/20/2018   HDL 78.90 02/20/2018   LDLCALC 42 02/20/2018   TRIG 47.0 02/20/2018   CHOLHDL 2 02/20/2018    Has had proliferative retinopathy, has been going regularly  Neuropathy with some sensory loss: Forms filled out for diabetic shoes in 2018  LABS:  No visits with results within 1 Week(s) from this visit.  Latest known visit with results is:  Telephone on 04/18/2018  Component Date Value Ref Range Status  . Rest HR 05/01/2018 76  bpm Final  . Rest BP 05/01/2018 182/84  mmHg Final  . Peak HR 05/01/2018 89  bpm Final  . Peak BP 05/01/2018 164/66  mmHg Final  . SSS 05/01/2018 5   Final  . SRS 05/01/2018 3   Final  . SDS 05/01/2018 2   Final  . TID 05/01/2018 0.99   Final  . LV sys vol 05/01/2018 24  mL Final  . LV dias vol 05/01/2018 57  46 - 106 mL Final    Physical Examination:  There were no vitals taken for this visit.     ASSESSMENT:  Diabetes type 1, long-standing on basal bolus insulin injections  A1c last was 7.6  See history of present illness for detailed discussion of current diabetes management, blood sugar patterns and  problems identified  With using the Omni pod insulin pump her blood sugar patterns are as above She has had significant episodes of hyperglycemia and some hypoglycemia with initial settings She does appear to be needing significantly high amount of basal rate for her pump during the day and much lower basal rates late evening and overnight However some of her blood sugar variability may be related to some rebound from hypoglycemia May have had some excessive correction of high readings from her correction bolus yesterday afternoon Also postprandial readings are somewhat variable but may be getting lower than when she was using 1: 5 carbohydrate coverage with her insulin injections Currently her basal rate has been set to give her a total of 13 units basal compared to 11 units on her [place] She is not able to understand that even when her blood sugars are below 100 that she will need to cover her carbohydrates with a bolus and this was explained   PLAN:   She will make the following changes in her settings to allow for more even control and prevention of tendency to hypoglycemia late at night or with boluses  Carbohydrate coverage 1: 7 Correction factor I: 60 Continue target of 120-140 Basal rate at 9 PM will be 0.35 and at 6 AM will be 0.60 until tomorrow  There are no Patient Instructions on file for this visit.  Counseling time on subjects discussed in assessment and plan sections is over 50% of today's 25 minute visit   Reather LittlerAjay Shantay Sonn 05/16/2018, 11:02 AM   Note: This note was prepared with Dragon voice recognition system technology. Any transcriptional errors that result from this process are  unintentional.

## 2018-05-17 ENCOUNTER — Encounter: Payer: Medicare Other | Admitting: Nutrition

## 2018-05-17 ENCOUNTER — Other Ambulatory Visit: Payer: Self-pay

## 2018-05-17 ENCOUNTER — Encounter: Payer: Self-pay | Admitting: Endocrinology

## 2018-05-17 ENCOUNTER — Ambulatory Visit (INDEPENDENT_AMBULATORY_CARE_PROVIDER_SITE_OTHER): Payer: Medicare Other | Admitting: Endocrinology

## 2018-05-17 DIAGNOSIS — E1065 Type 1 diabetes mellitus with hyperglycemia: Secondary | ICD-10-CM

## 2018-05-17 DIAGNOSIS — E1042 Type 1 diabetes mellitus with diabetic polyneuropathy: Secondary | ICD-10-CM

## 2018-05-17 MED ORDER — INSULIN ASPART 100 UNIT/ML ~~LOC~~ SOLN: 40.0000 [IU] | Freq: Once | SUBCUTANEOUS | 3 refills | Status: DC

## 2018-05-17 MED ORDER — GLUCOSE BLOOD VI STRP: 1.0000 | ORAL_STRIP | 3 refills | Status: DC | PRN

## 2018-05-17 NOTE — Progress Notes (Signed)
The daugterinlaw made changes to her basal rate, per Dr. Remus Blake orders with no help from me. We discussed high blood sugar protocol, temp basal rates--when and how to use this, and reviewed all topics on the pump checklist.  She signed off as understanding all topics and had no final questions.

## 2018-05-17 NOTE — Patient Instructions (Addendum)
Read over Resource manual and call help line if questions.

## 2018-05-17 NOTE — Progress Notes (Signed)
Patient ID: Natasha Hernandez, female   DOB: 06/24/1940, 78 y.o.   MRN: [id number]            Reason for Appointment : f/u for Type 1 Diabetes  History of Present Illness          Diagnosis: Type 1 diabetes mellitus, date of diagnosis: 1953          Previous history:  She thinks she has been on various insulin regimens over the years but has been on Lantus for 10-12 years along with mealtime insulin Previously followed by an endocrinologist but no records available. Her A1c has usually been around 7% reportedly and was 7.2 in 7/16   Recent history:   Her A1c is fairly stable at 7.6  INSULIN PUMP settings BASAL rate midnight = 0.35, 6 AM = 0.6.  9 PM = 0.35 with total basal 13 Carbohydrate coverage 1: 7, sensitivity 1: 60 and active insulin 4 hours. Blood sugar target 120-140    INSULIN regimen prior to pump: Tresiba 11 units daily, NOVOLOG carbohydrate coverage 1:5 and 1: 50 correction factor at meals  Current management, blood sugar patterns and problems identified:     She did not have any further increase in her blood sugar yesterday afternoon as she did on the first day of the pump  Also with reducing her basal rate at 9 PM her bedtime blood sugar was not low  Overnight blood sugar stayed about 149 she has a dawn phenomenon with blood sugar rising around 5 AM went up from 139 up to 150 around breakfast time  With her bolus coverage of 1: 7 carbohydrate ratio blood sugar last night was before after dinner and today went up to 188 after breakfast  Per glycemia  Although blood sugar was mildly high yesterday midday he was not as active and resting more  Her daughter-in-law is helping her program her pump  Glucose monitoring:  is being done several times a day         Glucometer: Freestyle libre  Blood Glucose readings and analysis as above  Factors causing Hypoglycemia: Overestimating mealtime coverage, exercise  Symptoms of hypoglycemia: Some weakness, confusion  at times Treatment of hypoglycemia: Corn syrup or jellybeans/other sweets.  She will get 15 g of carbohydrate  Self-care: The diet that the patient has been following is: Carbohydrate counting  Mealtimes are: Breakfast AT 8.30 AM Dinner: 7.30   For breakfast will have toast with cheese or egg, sometimes cereal and egg Periodically eating out         Exercise:  Only occasionally uses. exercise bike or other activities         Dietician consultation: Most recent: Years ago .         CDE consultation: 04/28/16    Wt Readings from Last 3 Encounters:  05/01/18 161 lb (73 kg)  04/16/18 161 lb 9.6 oz (73.3 kg)  03/20/18 163 lb 6.4 oz (74.1 kg)    Lab Results  Component Value Date   HGBA1C 7.6 (H) 02/20/2018   HGBA1C 7.3 (H) 09/05/2017   HGBA1C 7.2 (H) 06/06/2017   Lab Results  Component Value Date   MICROALBUR <0.7 06/06/2017   LDLCALC 42 02/20/2018   CREATININE 1.02 (H) 04/16/2018    Lab Results  Component Value Date   MICRALBCREAT 1.1 06/06/2017   MICRALBCREAT 0.8 05/04/2016     Allergies as of 05/17/2018   No Known Allergies     Medication List  Accurate as of May 17, 2018  9:15 AM. Always use your most recent med list.        aspirin EC 81 MG tablet Take 1 tablet (81 mg total) by mouth daily.   CALCIUM MAGNESIUM PO Take 1 tablet by mouth daily.   carvedilol 6.25 MG tablet Commonly known as:  COREG Take 1 tablet (6.25 mg total) by mouth 2 (two) times daily with a meal.   CO Q 10 PO Take 1 capsule by mouth daily.   ezetimibe 10 MG tablet Commonly known as:  ZETIA Take 1 tablet (10 mg total) by mouth daily.   FREESTYLE LIBRE READER Devi 1 Device by Does not apply route every 30 (thirty) days.   FREESTYLE LIBRE SENSOR SYSTEM Misc Inject 3 Devices into the skin every 14 (fourteen) days. Dx code- E10.65 NDC- 54656812751   glucose blood test strip 1 each by Other route as needed for other. Use as instructed to check blood sugar up to 5 times  daily. DX:E10.65   insulin aspart 100 UNIT/ML injection Commonly known as:  novoLOG Inject into the skin once. USE DAILY VIA INSULIN PUMP. DX:E10.65   isosorbide mononitrate 60 MG 24 hr tablet Commonly known as:  IMDUR Take 0.5 tablets (30 mg total) by mouth every morning. TAke 1 tablet by mouth every evening   lisinopril 20 MG tablet Commonly known as:  PRINIVIL,ZESTRIL Take 1 tablet (20 mg total) by mouth daily.   multivitamin tablet Take 1 tablet by mouth daily.   nitroGLYCERIN 0.4 MG/SPRAY spray Commonly known as:  NITROLINGUAL Place 1 spray under the tongue every 5 (five) minutes x 3 doses as needed for chest pain (Call 911 if taking 3rd spray).   OMNIPOD DASH 5 PACK Misc 1 each by Does not apply route every 3 (three) days. Use as directed.   rosuvastatin 40 MG tablet Commonly known as:  CRESTOR TAKE 1 TABLET BY MOUTH ONCE DAILY (INCREASED  DOSE)       Allergies: No Known Allergies  Past Medical History:  Diagnosis Date  . Carotid artery occlusion   . Coronary artery disease    CABG in 1996. Most recent cardiac catheterization in 2013 showed patent grafts including LIMA to LAD, SVG to OM, SVG to diagonal and SVG to PDA.  . Diabetes mellitus without complication (HCC)   . Hyperlipidemia   . Hypertension   . PAD (peripheral artery disease) (HCC)     Past Surgical History:  Procedure Laterality Date  . CORONARY ARTERY BYPASS GRAFT  1996    Family History  Problem Relation Age of Onset  . Breast cancer Mother 7  . Stroke Mother   . Emphysema Father 80  . Heart attack Father     Social History:  reports that she has never smoked. She has never used smokeless tobacco. She reports that she does not drink alcohol. No history on file for drug.      Review of Systems  The following is a copy of the previous note:  Lipids:  She is on Crestor 40 mg daily and since 7/19 also on Zetia  Has history of CAD in the past several years ago with coronary bypass LDL is much better with this   Lab Results  Component Value Date   CHOL 131 02/20/2018   HDL 78.90 02/20/2018   LDLCALC 42 02/20/2018   TRIG 47.0 02/20/2018   CHOLHDL 2 02/20/2018    Has had proliferative retinopathy, has been going regularly  Neuropathy with some sensory loss: Forms filled out for diabetic shoes in 2018  LABS:  No visits with results within 1 Week(s) from this visit.  Latest known visit with results is:  Telephone on 04/18/2018  Component Date Value Ref Range Status  . Rest HR 05/01/2018 76  bpm Final  . Rest BP 05/01/2018 182/84  mmHg Final  . Peak HR 05/01/2018 89  bpm Final  . Peak BP 05/01/2018 164/66  mmHg Final  . SSS 05/01/2018 5   Final  . SRS 05/01/2018 3   Final  . SDS 05/01/2018 2   Final  . TID 05/01/2018 0.99   Final  . LV sys vol 05/01/2018 24  mL Final  . LV dias vol 05/01/2018 57  46 - 106 mL Final    Physical Examination:  There were no vitals taken for this visit.     ASSESSMENT:  Diabetes type 1, long-standing on basal bolus insulin injections  A1c last was 7.6  See history of present illness for description of current diabetes management, blood sugar patterns and problems identified  With changing her settings yesterday blood sugars did much better and were fairly stable in the afternoon and evening Also overnight blood sugars are steady except for a dawn phenomenon seen at least for 2 days Postprandial readings are covered with current carbohydrate ratio which is  less than what she was using when on injections  PLAN:   She will make the following changes in her settings  Bolus settings unchanged Basal rate at 5 AM will be 0.65 until 9 AM and then 0.6 until 9 PM No other changes and she will call if she has any unusual patterns  There are no Patient Instructions on file for this visit.     Reather Littler 05/17/2018, 9:15 AM   Note: This note was prepared with Dragon voice recognition system technology. Any transcriptional errors that result from this process are unintentional.

## 2018-05-19 ENCOUNTER — Encounter: Payer: Self-pay | Admitting: Cardiovascular Disease

## 2018-05-19 ENCOUNTER — Ambulatory Visit (INDEPENDENT_AMBULATORY_CARE_PROVIDER_SITE_OTHER): Payer: Medicare Other | Admitting: Cardiovascular Disease

## 2018-05-19 VITALS — BP 124/54 | HR 74 | Ht 64.0 in | Wt 162.5 lb

## 2018-05-19 DIAGNOSIS — I2510 Atherosclerotic heart disease of native coronary artery without angina pectoris: Secondary | ICD-10-CM

## 2018-05-19 DIAGNOSIS — E785 Hyperlipidemia, unspecified: Secondary | ICD-10-CM

## 2018-05-19 DIAGNOSIS — I739 Peripheral vascular disease, unspecified: Secondary | ICD-10-CM | POA: Diagnosis not present

## 2018-05-19 DIAGNOSIS — I10 Essential (primary) hypertension: Secondary | ICD-10-CM

## 2018-05-19 DIAGNOSIS — I779 Disorder of arteries and arterioles, unspecified: Secondary | ICD-10-CM

## 2018-05-19 NOTE — Progress Notes (Signed)
Cardiology Office Note   Date:  05/19/2018   ID:  Natasha Hernandez, DOB 01/29/1941, MRN [id number]  PCP:  Reather LittlerKumar, Ajay, MD  Cardiologist:  Kirke CorinArida  Chief Complaint  Patient presents with  . OTHER    F/u ED chest pain no complaints today f/u stress test. Meds reviewed verbally with pt.      History of Present Illness: Natasha Hernandez is a 78 y.o. female who is here today for a follow-up visit. She moved last year from [place] to be close to her family. She has extensive medical problems that include coronary artery disease status post [facility] in [facility], type 1 diabetes since she was 78 years old, hypertension, hyperlipidemia, peripheral arterial disease and carotid artery disease. Most recent cardiac catheterization was done in 2013 which showed patent grafts including SVG to OM, SVG to diagonal, SVG to PDA and LIMA to LAD. She is known to have occluded right carotid artery . She is a lifelong nonsmoker. She is known to have peripheral arterial disease with no significant claudication. Noninvasive vascular evaluation in August 2017 showed an ABI of 0.85 on the right and 1.1 on the left. Duplex showed no obstructive aortoiliac or SFA disease. There was one-vessel runoff below the knee bilaterally.  She was briefly hospitalized at the end of December with atypical chest pain.  She ruled out for myocardial infarction.  Outpatient stress test was recommended which was performed and showed no evidence of ischemia or infarct with normal ejection fraction.  The chest pain was sharp and worse with certain movements.  She has not had any recurrent symptoms and she feels well overall.  Past Medical History:  Diagnosis Date  . Carotid artery occlusion   . Coronary artery disease    CABG in 1996. Most recent cardiac catheterization in 2013 showed patent grafts including LIMA to LAD, SVG to OM, SVG to diagonal and SVG to PDA.  . Diabetes mellitus without complication (HCC)     . Hyperlipidemia   . Hypertension   . PAD (peripheral artery disease) (HCC)     Past Surgical History:  Procedure Laterality Date  . CORONARY ARTERY BYPASS GRAFT  1996     Current Outpatient Medications  Medication Sig Dispense Refill  . aspirin EC 81 MG tablet Take 1 tablet (81 mg total) by mouth daily. 90 tablet 3  . Calcium-Magnesium-Vitamin D (CALCIUM MAGNESIUM PO) Take 1 tablet by mouth daily.    . carvedilol (COREG) 6.25 MG tablet Take 1 tablet (6.25 mg total) by mouth 2 (two) times daily with a meal. 180 tablet 3  . Coenzyme Q10 (CO Q 10 PO) Take 1 capsule by mouth daily.    . Continuous Blood Gluc Receiver (FREESTYLE LIBRE READER) DEVI 1 Device by Does not apply route every 30 (thirty) days. 3 Device 0  . Continuous Blood Gluc Sensor (FREESTYLE LIBRE SENSOR SYSTEM) MISC Inject 3 Devices into the skin every 14 (fourteen) days. Dx code- E10.65 NDC- [id number] 3 each 2  . ezetimibe (ZETIA) 10 MG tablet Take 1 tablet (10 mg total) by mouth daily. 30 tablet 3  . glucose blood test strip 1 each by Other route as needed for other. Use as instructed to check blood sugar 3 times daily. DX:E10.65 100 each 3  . Insulin Disposable Pump (OMNIPOD DASH 5 PACK) MISC 1 each by Does not apply route every 3 (three) days. Use as directed. 3 each 11  . isosorbide mononitrate (IMDUR) 60 MG  24 hr tablet Take 0.5 tablets (30 mg total) by mouth every morning. TAke 1 tablet by mouth every evening (Patient taking differently: Take 30-60 mg by mouth See admin instructions. Take 1/2 tablet every morning and take 1 tablet every evening) 135 tablet 1  . lisinopril (PRINIVIL,ZESTRIL) 20 MG tablet Take 1 tablet (20 mg total) by mouth daily. 90 tablet 3  . Multiple Vitamin (MULTIVITAMIN) tablet Take 1 tablet by mouth daily.    . nitroGLYCERIN (NITROLINGUAL) 0.4 MG/SPRAY spray Place 1 spray under the tongue every 5 (five) minutes x 3 doses as needed for chest pain (Call 911 if taking 3rd spray). 4.9 g 1  .  rosuvastatin (CRESTOR) 40 MG tablet TAKE 1 TABLET BY MOUTH ONCE DAILY (INCREASED  DOSE) (Patient taking differently: Take 40 mg by mouth daily. ) 90 tablet 1  . insulin aspart (NOVOLOG) 100 UNIT/ML injection Inject 40 Units into the skin once for 1 dose. USE DAILY VIA INSULIN PUMP. DX:E10.65 20 mL 3   No current facility-administered medications for this visit.     Allergies:   Patient has no known allergies.    Social History:  The patient  reports that she has never smoked. She has never used smokeless tobacco. She reports that she does not drink alcohol.   Family History:  The patient's Family history is negative for coronary artery disease.   ROS:  Please see the history of present illness.   Otherwise, review of systems are positive for none.   All other systems are reviewed and negative.    PHYSICAL EXAM: VS:  BP (!) 124/54 (BP Location: Left Arm, Patient Position: Sitting, Cuff Size: Normal)   Pulse 74   Ht 5\' 4"  (1.626 m)   Wt 162 lb 8 oz (73.7 kg)   BMI 27.89 kg/m  , BMI Body mass index is 27.89 kg/m. GEN: Well nourished, well developed, in no acute distress  HEENT: normal  Neck: no JVD, carotid bruits, or masses Cardiac: RRR; no murmurs, rubs, or gallops,no edema  Respiratory:  clear to auscultation bilaterally, normal work of breathing GI: soft, nontender, nondistended, + BS MS: no deformity or atrophy  Skin: warm and dry, no rash Neuro:  Strength and sensation are intact Psych: euthymic mood, full affect  EKG:  EKG is ordered today. Normal sinus rhythm with no significant ST or T wave changes.  Recent Labs: 04/15/2018: Hemoglobin 11.8; Platelets 168 04/16/2018: ALT 23; BUN 28; Creatinine, Ser 1.02; Potassium 3.9; Sodium 138    Lipid Panel    Component Value Date/Time   CHOL 131 02/20/2018 0917   TRIG 47.0 02/20/2018 0917   HDL 78.90 02/20/2018 0917   CHOLHDL 2 02/20/2018 0917   VLDL 9.4 02/20/2018 0917   LDLCALC 42 02/20/2018 0917      Wt Readings  from Last 3 Encounters:  05/19/18 162 lb 8 oz (73.7 kg)  05/01/18 161 lb (73 kg)  04/16/18 161 lb 9.6 oz (73.3 kg)       No flowsheet data found.    ASSESSMENT AND PLAN:  1.  Coronary artery disease involving native coronary arteries without angina:  Most recent cardiac catheterization in 2013 showed patent grafts.  She had recent atypical chest pain.  Lexiscan Myoview was normal and her symptoms have resolved.  Continue medical therapy.  2. Peripheral arterial disease: Currently with no significant claudication and no evidence of critical limb ischemia. Continue medical therapy.   3. 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  Most recent carotid Doppler in October of 2019 this year showed chronically occluded right coronary artery with mild nonobstructive disease on the left.  Repeat study in October 2020.  4. Hyperlipidemia: Continue treatment with rosuvastatin and Zetia.  Most recent lipid profile showed significant improvement in LDL down to 42.   5. Essential hypertension: Blood pressure is well controlled on current medications.    Disposition:   FU with me in 6 months  Signed,  Lorine BearsMuhammad Arida, MD  05/19/2018 3:09 PM    [facility]

## 2018-05-19 NOTE — Patient Instructions (Signed)

## 2018-05-30 ENCOUNTER — Telehealth: Payer: Self-pay | Admitting: Endocrinology

## 2018-05-30 ENCOUNTER — Other Ambulatory Visit: Payer: Self-pay

## 2018-05-30 DIAGNOSIS — E7800 Pure hypercholesterolemia, unspecified: Secondary | ICD-10-CM

## 2018-05-30 MED ORDER — EZETIMIBE 10 MG PO TABS: 10.0000 mg | ORAL_TABLET | Freq: Every day | ORAL | 3 refills | Status: DC

## 2018-05-30 NOTE — Telephone Encounter (Signed)
ezetimibe (ZETIA) 10 MG tablet 30 tablet 3 05/30/2018    Sig - Route: Take 1 tablet (10 mg total) by mouth daily. - Oral   Sent to pharmacy as: ezetimibe (ZETIA) 10 MG tablet   E-Prescribing Status: Receipt confirmed by pharmacy (05/30/2018 4:28 PM EST)

## 2018-05-30 NOTE — Telephone Encounter (Signed)
MEDICATION: ezetimibe (ZETIA) 10 MG tablet  PHARMACY:  [facility] - [place], [place] - [address]  IS THIS A 90 DAY SUPPLY : Monthly  IS PATIENT OUT OF MEDICATION: No  IF NOT; HOW MUCH IS LEFT: enough until end of February  LAST APPOINTMENT DATE: @1 /29/2020  NEXT APPOINTMENT DATE:@3 /05/2018  DO WE HAVE YOUR PERMISSION TO LEAVE A DETAILED MESSAGE:Yes  OTHER COMMENTS:    **Let patient know to contact pharmacy at the end of the day to make sure medication is ready. **  ** Please notify patient to allow 48-72 hours to process**  **Encourage patient to contact the pharmacy for refills or they can request refills through [facility]**

## 2018-05-31 DIAGNOSIS — M5136 Other intervertebral disc degeneration, lumbar region: Secondary | ICD-10-CM | POA: Insufficient documentation

## 2018-05-31 DIAGNOSIS — M419 Scoliosis, unspecified: Secondary | ICD-10-CM | POA: Diagnosis not present

## 2018-05-31 DIAGNOSIS — M545 Low back pain: Secondary | ICD-10-CM | POA: Diagnosis not present

## 2018-05-31 DIAGNOSIS — E109 Type 1 diabetes mellitus without complications: Secondary | ICD-10-CM | POA: Diagnosis not present

## 2018-05-31 DIAGNOSIS — M51369 Other intervertebral disc degeneration, lumbar region without mention of lumbar back pain or lower extremity pain: Secondary | ICD-10-CM | POA: Insufficient documentation

## 2018-06-02 DIAGNOSIS — E109 Type 1 diabetes mellitus without complications: Secondary | ICD-10-CM | POA: Insufficient documentation

## 2018-06-06 DIAGNOSIS — Z961 Presence of intraocular lens: Secondary | ICD-10-CM | POA: Diagnosis not present

## 2018-06-06 DIAGNOSIS — H18423 Band keratopathy, bilateral: Secondary | ICD-10-CM | POA: Diagnosis not present

## 2018-06-13 ENCOUNTER — Telehealth: Payer: Self-pay | Admitting: Nutrition

## 2018-06-13 NOTE — Telephone Encounter (Signed)
Patients's daughter called saying mother is very upset because blood sugars are high after lunch and for the rest of the evening, especially at night before bed.  Patient says blood sugars are 165-mid 200s.  Daughter says she downloaded PDM, but we are not able to view it.  She was given directions on how to put the proconnect code of "" into her account.  Daughter was in the car and said she will do this in 15 min., when she gets home.

## 2018-06-13 NOTE — Telephone Encounter (Signed)
Her download was printed and shown to Dr. Elvera Lennox.  Carb count for bkfast is low--she is eating high fat and protein, which can cause the blood sugar to rise acL and in the afternoon.  Her daughter was told this. Also some readings are good acS and one was low in the last 14 days, so no basal change was given by Dr. Elvera Lennox.   No HS readings on the download.  Pt. Was encouraged to do HS reading for when she returns to see Dr. Lucianne Muss next week. Daughter had no questions, and voice understanding of these instructions.

## 2018-06-18 NOTE — Progress Notes (Signed)
Patient ID: Natasha Hernandez, female   DOB: 03/05/1941, 78 y.o.   MRN: [id number]            Reason for Appointment : f/u for Type 1 Diabetes  History of Present Illness          Diagnosis: Type 1 diabetes mellitus, date of diagnosis: 1953          Previous history:  She thinks she has been on various insulin regimens over the years but has been on Lantus for 10-12 years along with mealtime insulin Previously followed by an endocrinologist but no records available. Her A1c has usually been around 7% reportedly and was 7.2 in 7/16   Recent history:   Her A1c is fairly stable at 7.6  INSULIN PUMP settings BASAL rate midnight = 0.35, 6 AM = 0.65.  9 PM = 0.35 with total basal 13 Carbohydrate coverage 1: 7, sensitivity 1: 60 and active insulin 4 hours. Blood sugar target 120-140    INSULIN regimen prior to pump: Tresiba 11 units daily, NOVOLOG carbohydrate coverage 1:5 and 1: 50 correction factor at meals  Current management, blood sugar patterns and problems identified:     Although she was having tendency to low blood sugars at bedtime the last visit she did not have the highest readings at that time  Some of her high readings are after dinner but most of her difficulty is when she is eating higher fat meals when her blood sugar goes up more significantly and may tend to stay persistently high also  Her lowest blood sugars are late morning and before lunch undermined start trending gradually higher than the rest of the day  She is using her mealtime boluses as before and sometimes will also take a correction bolus if blood sugars are consistently high  However occasionally may not be able to do adequate correction boluses for high readings  Blood sugars at breakfast time are mildly increased  She has only occasional hypoglycemia on waking up but none in the last week  She is usually using calorie [name] to estimate her carbohydrates but not adjusting boluses based on fat  intake  Glucose monitoring:  is being done several times a day         Glucometer: Freestyle libre  Blood Glucose readings and analysis   CGM use % of time  93  2-week average/SD  157  Time in range    68    %  % Time Above 180  27  % Time above 250 2  % Time Below 70  3     PRE-MEAL Fasting Lunch Dinner Bedtime Overall  Glucose range:       Averages:  146    192  157   POST-MEAL PC Breakfast PC Lunch PC Dinner  Glucose range:     Averages:  120  139  175    Average 12 AM-2 AM = 192  Factors causing Hypoglycemia: Overestimating mealtime coverage, exercise  Symptoms of hypoglycemia: Some weakness, confusion at times Treatment of hypoglycemia: Corn syrup or jellybeans/other sweets.  She will get 15 g of carbohydrate  Self-care: The diet that the patient has been following is: Carbohydrate counting  Mealtimes are: Breakfast AT 8.30 AM Dinner: 7.30   For breakfast will have toast with cheese or egg, sometimes cereal and egg Periodically eating out         Exercise:  Only occasionally uses. exercise bike or other activities  Dietician consultation: Most recent: Years ago .         CDE consultation: 04/28/16    Wt Readings from Last 3 Encounters:  06/19/18 161 lb 9.6 oz (73.3 kg)  05/19/18 162 lb 8 oz (73.7 kg)  05/01/18 161 lb (73 kg)    Lab Results  Component Value Date   HGBA1C 7.7 (A) 06/19/2018   HGBA1C 7.6 (H) 02/20/2018   HGBA1C 7.3 (H) 09/05/2017   Lab Results  Component Value Date   MICROALBUR <0.7 06/06/2017   LDLCALC 42 02/20/2018   CREATININE 1.02 (H) 04/16/2018    Lab Results  Component Value Date   MICRALBCREAT 1.1 06/06/2017   MICRALBCREAT 0.8 05/04/2016     Allergies as of 06/19/2018   No Known Allergies     Medication List       Accurate as of June 19, 2018 10:47 AM. Always use your most recent med list.        aspirin EC 81 MG tablet Take 1 tablet (81 mg total) by mouth daily.   CALCIUM MAGNESIUM PO Take 1 tablet by  mouth daily.   carvedilol 6.25 MG tablet Commonly known as:  COREG Take 1 tablet (6.25 mg total) by mouth 2 (two) times daily with a meal.   CO Q 10 PO Take 1 capsule by mouth daily.   ezetimibe 10 MG tablet Commonly known as:  ZETIA Take 1 tablet (10 mg total) by mouth daily.   FREESTYLE LIBRE READER Devi 1 Device by Does not apply route every 30 (thirty) days.   FREESTYLE LIBRE SENSOR SYSTEM Misc Inject 3 Devices into the skin every 14 (fourteen) days. Dx code- E10.65 NDC- [id number]   glucose blood test strip 1 each by Other route as needed for other. Use as instructed to check blood sugar 3 times daily. DX:E10.65   insulin aspart 100 UNIT/ML injection Commonly known as:  novoLOG Inject 40 Units into the skin once for 1 dose. USE DAILY VIA INSULIN PUMP. DX:E10.65   isosorbide mononitrate 60 MG 24 hr tablet Commonly known as:  IMDUR Take 0.5 tablets (30 mg total) by mouth every morning. TAke 1 tablet by mouth every evening   lisinopril 20 MG tablet Commonly known as:  PRINIVIL,ZESTRIL Take 1 tablet (20 mg total) by mouth daily.   multivitamin tablet Take 1 tablet by mouth daily.   nitroGLYCERIN 0.4 MG/SPRAY spray Commonly known as:  NITROLINGUAL Place 1 spray under the tongue every 5 (five) minutes x 3 doses as needed for chest pain (Call 911 if taking 3rd spray).   OMNIPOD DASH 5 PACK Misc 1 each by Does not apply route every 3 (three) days. Use as directed.   rosuvastatin 40 MG tablet Commonly known as:  CRESTOR TAKE 1 TABLET BY MOUTH ONCE DAILY (INCREASED  DOSE)       Allergies: No Known Allergies  Past Medical History:  Diagnosis Date  . Carotid artery occlusion   . Coronary artery disease    CABG in 1996. Most recent cardiac catheterization in 2013 showed patent grafts including LIMA to LAD, SVG to OM, SVG to diagonal and SVG to PDA.  . Diabetes mellitus without complication (HCC)   . Hyperlipidemia   . Hypertension   . PAD (peripheral artery  disease) (HCC)     Past Surgical History:  Procedure Laterality Date  . CORONARY ARTERY BYPASS GRAFT  1996    Family History  Problem Relation Age of Onset  . Breast cancer Mother 12  .  Stroke Mother   . Emphysema Father 32  . Heart attack Father     Social History:  reports that she has never smoked. She has never used smokeless tobacco. She reports that she does not drink alcohol. No history on file for drug.      Review of Systems                                                            Lipids:  She is on Crestor 40 mg daily and since 7/19 also on Zetia  Has history of CAD  several years ago with coronary bypass LDL is controlled adequately   Lab Results  Component Value Date   CHOL 131 02/20/2018   HDL 78.90 02/20/2018   LDLCALC 42 02/20/2018   TRIG 47.0 02/20/2018   CHOLHDL 2 02/20/2018    Has had proliferative retinopathy, has been going regularly for exams  Neuropathy with some sensory loss: Forms filled out for diabetic shoes in 2018  LABS:  Office Visit on 06/19/2018  Component Date Value Ref Range Status  . Hemoglobin A1C 06/19/2018 7.7* 4.0 - 5.6 % Final    Physical Examination:  BP 110/60 (BP Location: Left Arm, Patient Position: Sitting, Cuff Size: Normal)   Pulse 74   Ht 5\' 4"  (1.626 m)   Wt 161 lb 9.6 oz (73.3 kg)   SpO2 94%   BMI 27.74 kg/m      ASSESSMENT:  Diabetes type 1, long-standing on basal bolus insulin injections  A1c is 7.7 and about the same  See history of present illness for description of current diabetes management, blood sugar patterns and problems identified  Her blood sugar patterns appear to be different than when she first started the pump Now she has fairly consistently high readings late at night and during the night also This may be related to higher fat meals in the evenings She is not aware of needing to add extra insulin or extend the boluses for higher fat meals Blood sugars are relatively better  during the day with some variability As discussed above her highest blood sugars are late at night after 10 PM until 2 AM on an average and lowest midmorning Has had less hypoglycemia in the last week also She is asking about adequacy of her correction bolus   PLAN:   She will make the following changes in her settings  Bolus settings will be 1: 6 carbohydrate coverage at dinnertime Consider increasing correction factor, currently 1: 60  Basal rate at 12 AM will be 0.45 5 AM will be 0. 7 0 until 9 AM, 1 9 PM = 0.45 She will add at least 30% more to her carbohydrate intake for higher fat meals To call if her blood sugars are not easily controlled within the next week  Counseling time on subjects discussed in assessment and plan sections is over 50% of today's 25 minute visit   There are no Patient Instructions on file for this visit.     Reather Littler 06/19/2018, 10:47 AM   Note: This note was prepared with Dragon voice recognition system technology. Any transcriptional errors that result from this process are unintentional.

## 2018-06-19 ENCOUNTER — Encounter: Payer: Self-pay | Admitting: Endocrinology

## 2018-06-19 ENCOUNTER — Ambulatory Visit (INDEPENDENT_AMBULATORY_CARE_PROVIDER_SITE_OTHER): Payer: Medicare Other | Admitting: Endocrinology

## 2018-06-19 VITALS — BP 110/60 | HR 74 | Ht 64.0 in | Wt 161.6 lb

## 2018-06-19 DIAGNOSIS — I2510 Atherosclerotic heart disease of native coronary artery without angina pectoris: Secondary | ICD-10-CM

## 2018-06-19 DIAGNOSIS — E1065 Type 1 diabetes mellitus with hyperglycemia: Secondary | ICD-10-CM

## 2018-06-19 LAB — POCT GLYCOSYLATED HEMOGLOBIN (HGB A1C): Hemoglobin A1C: 7.7 % — AB (ref 4.0–5.6)

## 2018-06-21 ENCOUNTER — Telehealth: Payer: Self-pay | Admitting: Nutrition

## 2018-06-21 NOTE — Telephone Encounter (Signed)
Called pt and her daughter in law answered and stated that she could not provide the blood sugars right now since pt was napping. Daughter in law requested that I call back in the morning.

## 2018-06-21 NOTE — Telephone Encounter (Signed)
Blood sugar download does not have any readings since Monday

## 2018-06-21 NOTE — Telephone Encounter (Signed)
Daughter called saying blood sugars still high on pump.  She downloaded pump.   Download put on Dr. Remus Blake desk Wednesday

## 2018-06-21 NOTE — Telephone Encounter (Signed)
I will need blood sugar readings from today and yesterday

## 2018-06-21 NOTE — Telephone Encounter (Signed)
That's when it was downloaded.  I did not pull the message off the machine until Yesterday, and downloaded it this morning

## 2018-06-22 NOTE — Telephone Encounter (Signed)
Blood sugar readings are as follows for the past few days: 06/22/2018- 218 at 0812 06/21/2018- 133 at 2203 06/21/2018- 123 at 1716 06/21/2018- 110 at 0749 06/20/2018 168 at 2340 06/20/2018- 138 at 0938  Daughter in law stated that in between the blood sugar times on the 4'th, she had several lows according to the [employer]. All the values listed above are finger sticks. Daughter in law also stated that pt is symptomatic of hypoglycemia when blood sugar is in the 120's, and her blood sugar ranges on the PDM are set at 120-140. She would like to talk about increasing the value slightly.   Also stated that Crestor, Zetia, and insulin being used in the PDM are beginning to get too expensive for the pt and she would like to know if there is an alternative.

## 2018-06-22 NOTE — Telephone Encounter (Signed)
Need to know what time of the day her blood sugars were below 100 or having symptoms of low sugars.  Also need to know if her sugar was high waking up yesterday

## 2018-06-22 NOTE — Telephone Encounter (Signed)
Confirm that she has a basal rate of 0.7 between 5 AM and 9 AM and 0.65 between 9 AM and 9 PM.  Also confirm that she did not have low sugars during sleeping hours She will then reduce her basal rate to 0.60 at 5 AM from 9 AM and 0.55 between 9 AM and 9 PM Need to have her come back for follow-up in about 2 weeks

## 2018-06-22 NOTE — Telephone Encounter (Signed)
Called pt's daughter in law and gave her this information. Daughter in law stated that she is not sure what the current basal rates are set at right now, as she is driving and not with the patient. Daughter in law pulled over and began writing down MD message and will confirm these basal rates as well as change them to the new basal rate.  Daughter in law stated that she could come for a f/u appt on 07/07/2018 at 0900. She was informed that I would ensure this slot was made available to her per the doctors request.  Judeth Cornfield, could you please create another 0900 on 07/07/2018?

## 2018-06-22 NOTE — Telephone Encounter (Signed)
Pt's daughter in law stated that anything in the 120 range, the patient is symptomatic.  Blood sugars that were below were as follows: 06/21/2018 92 @ 0547 70 @ 0745 110 @ 0749 (finger stick) 65 @ 1316 78 @ 1600 95 @ 1710 123@ 1716 (finger stick) 51 @ 1856 Low @ 1935 47 @ 2010 82 @ 2105 108 @ 2202 133 @ 2233 (finger stick)  Sugars were not high upon waking up as evidenced by readings.

## 2018-06-23 ENCOUNTER — Encounter: Payer: Self-pay | Admitting: Podiatry

## 2018-06-23 ENCOUNTER — Ambulatory Visit (INDEPENDENT_AMBULATORY_CARE_PROVIDER_SITE_OTHER): Payer: Medicare Other | Admitting: Podiatry

## 2018-06-23 DIAGNOSIS — Q828 Other specified congenital malformations of skin: Secondary | ICD-10-CM | POA: Diagnosis not present

## 2018-06-23 DIAGNOSIS — M79676 Pain in unspecified toe(s): Secondary | ICD-10-CM | POA: Diagnosis not present

## 2018-06-23 DIAGNOSIS — E1142 Type 2 diabetes mellitus with diabetic polyneuropathy: Secondary | ICD-10-CM | POA: Diagnosis not present

## 2018-06-23 DIAGNOSIS — M79675 Pain in left toe(s): Secondary | ICD-10-CM

## 2018-06-23 DIAGNOSIS — B351 Tinea unguium: Secondary | ICD-10-CM

## 2018-06-23 NOTE — Progress Notes (Signed)
Complaint:  Visit Type: Patient returns to my office for continued preventative foot care services. Complaint: Patient states" my nails have grown long and thick and become painful to walk and wear shoes" Patient has been diagnosed with DM with neuropathy.  Patient has callus both feet.. The patient presents for preventative foot care services. No changes to ROS  Podiatric Exam: Vascular: dorsalis pedis and posterior tibial pulses are palpable bilateral. Capillary return is immediate. Temperature gradient is WNL. Skin turgor WNL  Sensorium: Diminished  Semmes Weinstein monofilament test. Normal tactile sensation bilaterally. Nail Exam: Pt has thick disfigured discolored nails with subungual debris noted bilateral entire nail hallux through fifth toenails Ulcer Exam: There is no evidence of ulcer or pre-ulcerative changes or infection. Orthopedic Exam: Muscle tone and strength are WNL. No limitations in general ROM. No crepitus or effusions noted. Foot type and digits show no abnormalities. 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 No infection or ulcers  Diagnosis:  Onychomycosis, , Pain in right toe, pain in left toes,  Porokeratosis  B/L.  Treatment & Plan Procedures and Treatment: Consent by patient was obtained for treatment procedures.   Debridement of mycotic and hypertrophic toenails, 1 through 5 bilateral and clearing of subungual debris. No ulceration, no infection noted. Debridement of porokeratosis  B/L. ABN signed for 2020.  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 To be appointed with pedorthist. Return Visit-Office Procedure: Patient instructed to return to the office for a follow up visit 3 months for continued evaluation and treatment.    Helane Gunther DPM

## 2018-06-23 NOTE — Telephone Encounter (Signed)
Please reach out about NP slots and let me know which one she can do and I will add her

## 2018-06-27 ENCOUNTER — Other Ambulatory Visit: Payer: Self-pay

## 2018-06-27 MED ORDER — INSULIN LISPRO 100 UNIT/ML ~~LOC~~ SOLN: SUBCUTANEOUS | 3 refills | Status: DC

## 2018-06-27 NOTE — Telephone Encounter (Signed)
Pt is set up on 3/20 at 11 for appt with Lucianne Muss.

## 2018-06-28 ENCOUNTER — Other Ambulatory Visit: Payer: Self-pay

## 2018-06-28 MED ORDER — INSULIN LISPRO 100 UNIT/ML ~~LOC~~ SOLN: SUBCUTANEOUS | 3 refills | Status: DC

## 2018-06-29 ENCOUNTER — Other Ambulatory Visit: Payer: Self-pay | Admitting: Endocrinology

## 2018-07-05 ENCOUNTER — Telehealth: Payer: Self-pay | Admitting: Endocrinology

## 2018-07-05 NOTE — Telephone Encounter (Signed)
She needs to let us know when the pump is uploaded

## 2018-07-05 NOTE — Telephone Encounter (Signed)
Calll pt's daughter in law and informed her of this. She stated that she did not know if she was able to upload the [facility] or not, but she would try. Pt's daughter in law also verbalized understanding that the review of blood sugars, and insulin pump would be considered an E-visit and be billed to Medicare.

## 2018-07-05 NOTE — Telephone Encounter (Signed)
Will need to see if she can upload her freestyle libre also.  Please let me know when it is ready.  Also make her aware that this is considered an E-visit which will be charged to [employer]

## 2018-07-05 NOTE — Telephone Encounter (Signed)
Patient's Daughter stated that she did not want to bring her mother out for this visit due to the Virus and trying to keep her safe.  She sated that she will upload her Blood sugar numbers for Dr Lucianne Muss.

## 2018-07-05 NOTE — Telephone Encounter (Signed)
Attempted to call pt's daughter in law to inform her of this. Pt did not answer.

## 2018-07-05 NOTE — Telephone Encounter (Signed)
fyi

## 2018-07-05 NOTE — Telephone Encounter (Signed)
Patient's Daughter in law returned call from the office. Advised patient of Dr Remus Blake Message to let us know when these are downloaded. She stated she will Down Load these Thursday Evening so these are avaiable on Friday for Dr Lucianne Muss. Marland Kitchen

## 2018-07-05 NOTE — Telephone Encounter (Signed)
FYI

## 2018-07-07 ENCOUNTER — Telehealth: Payer: Self-pay | Admitting: Endocrinology

## 2018-07-07 ENCOUNTER — Ambulatory Visit: Payer: Medicare Other | Admitting: Endocrinology

## 2018-07-07 ENCOUNTER — Encounter (INDEPENDENT_AMBULATORY_CARE_PROVIDER_SITE_OTHER): Payer: Medicare Other

## 2018-07-07 DIAGNOSIS — E1065 Type 1 diabetes mellitus with hyperglycemia: Secondary | ICD-10-CM | POA: Diagnosis not present

## 2018-07-07 NOTE — Telephone Encounter (Signed)
Noted  

## 2018-07-07 NOTE — Telephone Encounter (Addendum)
Reason for consultation: Evaluation of current blood sugar patterns and abnormal blood sugars, advice on improved pump management Patient had requested the consultation through my chart  CONTINUOUS GLUCOSE MONITORING RECORD INTERPRETATION    Dates of Recording: 3/9 through 07/06/2018  Sensor description: Freestyle libre   Glycemic patterns summary: Blood sugars are quite variable and no consistent pattern seen.  However hypoglycemia has been minimal  Hyperglycemic episodes occurred 3 times later on in the evening after a 7-8 PM with blood sugars over 200 on the 13th, 15th and 17 March with highest reading 284 Also had recently infrequent higher sugars at 12 noon, after breakfast.  Overnight hypoglycemia occurred on the 16th  Hypoglycemic episodes occurred transiently with mild decrease in blood sugars in the 60s at 6 AM on the 15th and at 7 PM on 15 March.  Overnight periods: Blood sugars are quite variable with low normal readings once, otherwise either within the range or as on 3/16 persistently high overnight  Preprandial periods: Fasting readings are quite variable, low once as above but otherwise not consistent over the last 10 days  Postprandial periods: After breakfast: Blood sugars have been higher twice but otherwise not spiking After lunch: Blood sugars are usually well controlled After dinner: Are quite variable with some good readings and occasionally significantly high readings as discussed above  Review of data from the insulin pump for the last 2 weeks showed recently that she has been bolusing consistently with all meals and pre-meal blood sugars are entered have been more variable in the morning compared to lunch and dinner She is using 46% of her insulin and boluses Overall blood sugar average 184 over the last 2 weeks Unclear whether she is adding extra insulin to cover higher fat meals as discussed before  Current basal rate settings at midnight = 0.45, 5 AM = 0.6, 9  AM = 0.55 and 9 PM = 0.45 Carbohydrate coverage 1: 7 with target 120 and sensitivity 1: 60  RECOMMENDATIONS have been made on the MyChart message and she will reduce her basal rate at 5 AM down to 0.55 She does need to increase her boluses at any meal that has relatively higher fat over 5 g by adding additional 10 to 15 g carbohydrate  Total time spent on reviewing data and interpretation is 30 minutes and another 15 minutes in documentation and communication with patient  Note: Patient's insulin pump data which was analyzed was scanned separately into media section

## 2018-07-07 NOTE — Telephone Encounter (Signed)
Patient's Daughter Merry Proud stated she uploaded her information for the blood sugars  She is needing a "code" for the freestyle libra to upload.

## 2018-07-29 ENCOUNTER — Other Ambulatory Visit: Payer: Self-pay | Admitting: Endocrinology

## 2018-07-29 NOTE — Telephone Encounter (Signed)
Please refill if appropriate

## 2018-07-31 NOTE — Telephone Encounter (Signed)
Left a message for the patient to call back to confirm the Imdur dosage.

## 2018-08-01 ENCOUNTER — Ambulatory Visit: Payer: Medicare Other | Admitting: Orthotics

## 2018-08-01 ENCOUNTER — Other Ambulatory Visit: Payer: Self-pay

## 2018-08-01 VITALS — Temp 97.7°F

## 2018-08-01 DIAGNOSIS — M79675 Pain in left toe(s): Secondary | ICD-10-CM

## 2018-08-01 DIAGNOSIS — L84 Corns and callosities: Secondary | ICD-10-CM

## 2018-08-01 DIAGNOSIS — B351 Tinea unguium: Secondary | ICD-10-CM

## 2018-08-01 DIAGNOSIS — Q828 Other specified congenital malformations of skin: Secondary | ICD-10-CM

## 2018-08-01 NOTE — Telephone Encounter (Signed)
Call placed to the patient to verify the dosage of Imdur that the patient is taking due to the refill request.  The patient stated that she has been taking Imdur 30 mg in the morning and 60 mg in the evening.

## 2018-08-03 MED ORDER — ISOSORBIDE MONONITRATE ER 60 MG PO TB24: ORAL_TABLET | ORAL | 1 refills | Status: DC

## 2018-08-03 NOTE — Addendum Note (Signed)
Addended by: Sandi Mariscal on: 08/03/2018 01:07 PM   Modules accepted: Orders

## 2018-08-03 NOTE — Telephone Encounter (Signed)
Per Dr. Kirke Corin, the Imdur has been refilled.

## 2018-08-04 ENCOUNTER — Other Ambulatory Visit: Payer: Medicare Other

## 2018-08-16 ENCOUNTER — Other Ambulatory Visit: Payer: Self-pay

## 2018-08-16 MED ORDER — GLUCOSE BLOOD VI STRP: ORAL_STRIP | 3 refills | Status: DC

## 2018-08-18 ENCOUNTER — Ambulatory Visit (INDEPENDENT_AMBULATORY_CARE_PROVIDER_SITE_OTHER): Payer: Medicare Other | Admitting: Endocrinology

## 2018-08-18 ENCOUNTER — Other Ambulatory Visit: Payer: Self-pay

## 2018-08-18 ENCOUNTER — Encounter: Payer: Self-pay | Admitting: Endocrinology

## 2018-08-18 VITALS — BP 110/74 | HR 79 | Ht 64.0 in | Wt 162.4 lb

## 2018-08-18 DIAGNOSIS — I10 Essential (primary) hypertension: Secondary | ICD-10-CM | POA: Diagnosis not present

## 2018-08-18 DIAGNOSIS — E1065 Type 1 diabetes mellitus with hyperglycemia: Secondary | ICD-10-CM

## 2018-08-18 DIAGNOSIS — I2510 Atherosclerotic heart disease of native coronary artery without angina pectoris: Secondary | ICD-10-CM | POA: Diagnosis not present

## 2018-08-18 DIAGNOSIS — E7800 Pure hypercholesterolemia, unspecified: Secondary | ICD-10-CM

## 2018-08-18 LAB — COMPREHENSIVE METABOLIC PANEL
ALT: 17 U/L (ref 0–35)
AST: 17 U/L (ref 0–37)
Albumin: 4.1 g/dL (ref 3.5–5.2)
Alkaline Phosphatase: 83 U/L (ref 39–117)
BUN: 20 mg/dL (ref 6–23)
CO2: 30 mEq/L (ref 19–32)
Calcium: 9.1 mg/dL (ref 8.4–10.5)
Chloride: 104 mEq/L (ref 96–112)
Creatinine, Ser: 0.91 mg/dL (ref 0.40–1.20)
GFR: 60.04 mL/min (ref >60.00)
Glucose, Bld: 189 mg/dL — ABNORMAL HIGH (ref 70–99)
Potassium: 4.4 mEq/L (ref 3.5–5.1)
Sodium: 141 mEq/L (ref 135–145)
Total Bilirubin: 0.4 mg/dL (ref 0.2–1.2)
Total Protein: 6.3 g/dL (ref 6.0–8.3)

## 2018-08-18 LAB — MICROALBUMIN / CREATININE URINE RATIO
Creatinine,U: 83.4 mg/dL
Microalb Creat Ratio: 4.5 mg/g (ref 0.0–30.0)
Microalb, Ur: 3.7 mg/dL — ABNORMAL HIGH (ref 0.0–1.9)

## 2018-08-18 NOTE — Progress Notes (Signed)
Patient ID: Natasha Hernandez, female   DOB: 02/10/1941, 78 y.o.   MRN: [id number]            Reason for Appointment : Follow-up for Type 1 Diabetes  History of Present Illness          Diagnosis: Type 1 diabetes mellitus, date of diagnosis: 1953          Previous history:  She thinks she has been on various insulin regimens over the years but has been on Lantus for 10-12 years along with mealtime insulin Previously followed by an endocrinologist but no records available. Her A1c has usually been around 7% reportedly and was 7.2 in 7/16   Recent history:   Her A1c is fairly stable at 7.7 06/2018  INSULIN PUMP settings BASAL rate midnight = 0.45, 5 AM = 0.55.  9 PM = 0. 0.45 with total basal 13 Carbohydrate coverage 1:6, sensitivity 1: 60 and active insulin 4 hours. Blood sugar target 120-140   Current management, blood sugar patterns and problems identified:     Her basal rates were reduced about a month ago having relatively low sugars during the day  Currently she is having fairly stable blood sugars overall seen on her CGM data below  HYPOGLYCEMIA has occurred only the last few days with low sugars before dinner on Wednesday and before and after lunch on Thursday night before this her blood sugars have been low normal only at lunch once  She thinks that she feels hypoglycemic when her blood sugars are elevated  Currently she says that his blood sugars are low normal at the time of her meals she will not bolus still after eating because the blood sugar will go down further  She will then subsequently have high sugars after the meal  She is still periodically checking her blood sugar with a fingerstick because she thinks her freestyle Josephine Igolibre is not always accurate  Yesterday her blood sugar was 45 on the sensor and 70 with a fingerstick around the same time she still felt hypoglycemic  Also occasionally in the high range blood sugar may not be accurate with the sensor   She now says that she has observed that her blood sugars will be lower when she is wearing her Omni pod in her abdomen is this with every 3 parts   She is usually using calorie [name] to estimate her carbohydrates but not adjusting boluses based on fat intake.  She was told to add extra insulin to 5 meals such as pizza but she has not done so and she will have significantly high sugars when she does this especially on Sundays when she is eating relatively high fat meals at any time of the day.  Blood sugar may also be higher for a longer period of time  Otherwise her postprandial readings are generally fairly well controlled  Glucose monitoring:  is being done several times a day         Glucometer: Freestyle libre  Blood Glucose analysis and averages from freestyle libre  CGM use % of time  98  2-week average/SD  149+/-33  Time in range        75  %  % Time Above 180  16  % Time above 250  4  % Time Below 70  5     PRE-MEAL Fasting Lunch Dinner Bedtime Overall  Glucose range:       Averages:  129  143  154  161  149  POST-MEAL PC Breakfast PC Lunch PC Dinner  Glucose range:     Averages:  133  152  159      Factors causing Hypoglycemia: Overestimating mealtime coverage, exercise  Symptoms of hypoglycemia: Some weakness, confusion at times Treatment of hypoglycemia: Corn syrup or jellybeans/other sweets.  She will get 15 g of carbohydrate  Self-care: The diet that the patient has been following is: Carbohydrate counting  Mealtimes are: Breakfast AT 8.30 AM Dinner: 7.30   For breakfast will have toast with cheese or egg, sometimes cereal and egg   Exercise: Recently walking and that time will use her exercise bike        Dietician consultation: Most recent: Years ago .         CDE consultation: 04/2018   Wt Readings from Last 3 Encounters:  08/18/18 162 lb 6.4 oz (73.7 kg)  06/19/18 161 lb 9.6 oz (73.3 kg)  05/19/18 162 lb 8 oz (73.7 kg)    Lab Results  Component  Value Date   HGBA1C 7.7 (A) 06/19/2018   HGBA1C 7.6 (H) 02/20/2018   HGBA1C 7.3 (H) 09/05/2017   Lab Results  Component Value Date   MICROALBUR <0.7 06/06/2017   LDLCALC 42 02/20/2018   CREATININE 1.02 (H) 04/16/2018    Lab Results  Component Value Date   MICRALBCREAT 1.1 06/06/2017   MICRALBCREAT 0.8 05/04/2016     Allergies as of 08/18/2018   No Known Allergies     Medication List       Accurate as of Aug 18, 2018  9:01 AM. Always use your most recent med list.        aspirin EC 81 MG tablet Take 1 tablet (81 mg total) by mouth daily.   CALCIUM MAGNESIUM PO Take 1 tablet by mouth daily.   carvedilol 6.25 MG tablet Commonly known as:  COREG Take 1 tablet (6.25 mg total) by mouth 2 (two) times daily with a meal.   CO Q 10 PO Take 1 capsule by mouth daily.   ezetimibe 10 MG tablet Commonly known as:  ZETIA Take 1 tablet (10 mg total) by mouth daily.   FreeStyle Libre Reader Devi 1 Device by Does not apply route every 30 (thirty) days.   FreeStyle Libre Sensor System Misc Inject 3 Devices into the skin every 14 (fourteen) days. Dx code- E10.65 NDC- 16109604540   glucose blood test strip Commonly known as:  FreeStyle Precision Neo Test Use Freestyle Precision Neo strips to check blood sugar 3 times daily. DX:E10.65   insulin lispro 100 UNIT/ML injection Commonly known as:  HUMALOG Use a maximum of 40 units under the skin daily via omnipod insulin pump. DX:E10.65   isosorbide mononitrate 60 MG 24 hr tablet Commonly known as:  IMDUR Take half a tablet in the morning (30 mg) and a whole tablet in the evening (60 mg)   lisinopril 20 MG tablet Commonly known as:  ZESTRIL Take 1 tablet (20 mg total) by mouth daily.   multivitamin tablet Take 1 tablet by mouth daily.   nitroGLYCERIN 0.4 MG/SPRAY spray Commonly known as:  Nitrolingual Place 1 spray under the tongue every 5 (five) minutes x 3 doses as needed for chest pain (Call 911 if taking 3rd spray).    OmniPod Dash 5 Pack Misc 1 each by Does not apply route every 3 (three) days. Use as directed.   rosuvastatin 40 MG tablet Commonly known as:  CRESTOR Take 1 tablet (40 mg total) by mouth daily.  Allergies: No Known Allergies  Past Medical History:  Diagnosis Date  . Carotid artery occlusion   . Coronary artery disease    CABG in 1996. Most recent cardiac catheterization in 2013 showed patent grafts including LIMA to LAD, SVG to OM, SVG to diagonal and SVG to PDA.  . Diabetes mellitus without complication (HCC)   . Hyperlipidemia   . Hypertension   . PAD (peripheral artery disease) (HCC)     Past Surgical History:  Procedure Laterality Date  . CORONARY ARTERY BYPASS GRAFT  1996    Family History  Problem Relation Age of Onset  . Breast cancer Mother 1  . Stroke Mother   . Emphysema Father 31  . Heart attack Father     Social History:  reports that she has never smoked. She has never used smokeless tobacco. She reports that she does not drink alcohol. No history on file for drug.      Review of Systems                                                            Lipids:  She is on Crestor 40 mg daily and since 7/19 also on Zetia  Has history of CAD  several years ago with coronary bypass LDL is controlled adequately   Lab Results  Component Value Date   CHOL 131 02/20/2018   HDL 78.90 02/20/2018   LDLCALC 42 02/20/2018   TRIG 47.0 02/20/2018   CHOLHDL 2 02/20/2018    Has had proliferative retinopathy, has been going regularly for exams  Neuropathy with some sensory loss: Forms filled out for diabetic shoes previously  HYPERTENSION:  This is treated with lisinopril 20 mg daily  BP Readings from Last 3 Encounters:  08/18/18 110/74  06/19/18 110/60  05/19/18 (!) 124/54     LABS:  No visits with results within 1 Week(s) from this visit.  Latest known visit with results is:  Office Visit on 06/19/2018  Component Date Value Ref Range Status   . Hemoglobin A1C 06/19/2018 7.7* 4.0 - 5.6 % Final     Physical Examination:  BP 110/74 (BP Location: Left Arm, Patient Position: Sitting, Cuff Size: Normal)   Pulse 79   Ht  (1.626 m)   Wt 162 lb 6.4 oz (73.7 kg)   SpO2 96%   BMI 27.88 kg/m      ASSESSMENT:  Diabetes type 1, long-standing  A1c is 7.7 and about the same as of 3/20  See history of present illness for description of current diabetes management, blood sugar patterns and problems identified  She is on an Omni pod insulin pump with Humalog insulin  Her blood sugar patterns show fairly good blood sugar control most of the time She does have some days of hypoglycemia when she is eating high-fat foods on weekends but usually has excellent blood sugars considering her age and duration of diabetes  Discussed her blood sugar patterns, variability based on diet and activity level She has difficulty covering high-fat meals such as pizza and had not following instructions for adding additional hydrates to cover the high-fat meal as well as bolusing and doing corrections for postprandial hyperglycemia Also has tendency to low blood sugars recently but she thinks this is from wearing her Omni pod on her abdomen  compared to her arms  She is also concerned that she cannot bolus until sometime after eating and she has a low normal sugar otherwise she will get hypoglycemic.  Then this will sometimes cause hyperglycemia subsequently  [facility] may not be completely accurate and may get falsely low readings at times also  Pretension: Well controlled  PLAN:   Her basic pump settings will be continued unchanged However blood sugar target will be raised up to 135 so that her reverse correction will reduce her bolus insulin and she can take her bolus right at mealtime more confidently Changes were made in the office  Add 10 to 15 g for higher fat meals and monitor postprandial readings for correction  She will also  try to use temporary basal when she is exercising if blood sugars are near normal  Discussed the use of the Dexcom sensor and explained the benefits, how this is used and practical aspects of using this Patient information brochure with contact information given She will also be given test strips her fingersticks  Check microalbumin and chemistry panel today  Counseling time on subjects discussed in assessment and plan sections is over 50% of today's 25 minute visit   There are no Patient Instructions on file for this visit.     Reather Littler 08/18/2018, 9:01 AM   Note: This note was prepared with Dragon voice recognition system technology. Any transcriptional errors that result from this process are unintentional.

## 2018-08-24 ENCOUNTER — Ambulatory Visit (INDEPENDENT_AMBULATORY_CARE_PROVIDER_SITE_OTHER): Payer: Medicare Other | Admitting: Family Medicine

## 2018-08-24 ENCOUNTER — Encounter: Payer: Self-pay | Admitting: Family Medicine

## 2018-08-24 ENCOUNTER — Other Ambulatory Visit: Payer: Self-pay

## 2018-08-24 VITALS — BP 122/64 | HR 88 | Temp 98.5°F | Resp 18 | Ht 64.0 in | Wt 161.8 lb

## 2018-08-24 DIAGNOSIS — R300 Dysuria: Secondary | ICD-10-CM

## 2018-08-24 DIAGNOSIS — N390 Urinary tract infection, site not specified: Secondary | ICD-10-CM | POA: Diagnosis not present

## 2018-08-24 LAB — URINALYSIS, ROUTINE W REFLEX MICROSCOPIC
Bilirubin Urine: NEGATIVE
Glucose, UA: NEGATIVE
Hyaline Cast: NONE SEEN /LPF
Ketones, ur: NEGATIVE
Nitrite: NEGATIVE
Specific Gravity, Urine: 1.02 (ref 1.001–1.03)
pH: 6 (ref 5.0–8.0)

## 2018-08-24 LAB — MICROSCOPIC MESSAGE

## 2018-08-24 MED ORDER — CEPHALEXIN 500 MG PO CAPS: 500.0000 mg | ORAL_CAPSULE | Freq: Three times a day (TID) | ORAL | 0 refills | Status: DC

## 2018-08-24 MED ORDER — CEPHALEXIN 500 MG PO CAPS: 500.0000 mg | ORAL_CAPSULE | Freq: Three times a day (TID) | ORAL | 0 refills | Status: AC

## 2018-08-24 NOTE — Patient Instructions (Addendum)
Will start treatment for possible UTI with the best broad spectrum antibiotic.  We will call you with urine culture results.  Please follow up with [place] if your symptoms are not improving in 3-5 days.  Schedule a medicare wellness visit - it is a free visit that lets your PCP screen you annually and review and update your chart with all the information from other specialist and any other new medical history.      Below is an example of some of the health maintenance that would be updated or checked on.    Natasha Hernandez , Thank you for taking time to come for your Medicare Wellness Visit. I appreciate your ongoing commitment to your health goals. Please review the following plan we discussed and let me know if I can assist you in the future.    This is a list of the screening recommended for you and due dates:  Health Maintenance  Topic Date Due  . Eye exam for diabetics  03/21/1951  . Tetanus Vaccine  03/21/1961  . DEXA scan (bone density measurement)  03/22/2007  . Pneumonia vaccines (2 of 2 - PPSV23) 05/07/2017  . Complete foot exam   11/05/2018  . Flu Shot  11/18/2018  . Hemoglobin A1C  12/20/2018    A WORD TO OUR PATIENTS ABOUT MEDICARE AND [facility] CARE  Dear Patient,  We want you to receive wellness care - health care that may lower your risk of illness or injury. Medicare pays for some wellness care, but it does not pay for all the wellness care you might need. We want you to know about your Medicare benefits and how we can help you get the most from them.  The term "physical" is often used to describe wellness care. But Medicare does not pay for a traditional, head-to-toe physical. Medicare does pay for a wellness visit once a year to identify health risks and help you to reduce them. At your wellness visit, our health care team will take a complete health history and provide several other services:  Marland Kitchen Screenings to detect depression, risk for falling and other problems, . A  limited physical exam to check your blood pressure, weight, vision and other things depending on your age, gender and level of activity, . Recommendations for other wellness services and healthy lifestyle changes. Before your appointment, our staff will ask you some questions about your health and may ask you to fill out a form.  A wellness visit does not deal with new or existing health problems. That would be a separate service and requires a longer appointment. Please let our scheduling staff know if you need the doctor's help with a health problem, a medication refill or something else. We may need to schedule a separate appointment. A separate charge applies to these services, whether provided on the same date or a different date than the wellness visit.  We hope to help you get the most from your Medicare wellness benefits. Please contact us with any questions.

## 2018-08-24 NOTE — Progress Notes (Signed)
Patient ID: Oralia RudJimmi Kaye Hernandez, female    DOB: 05/06/1940, 78 y.o.   MRN: [id number]  PCP: Danelle Berryapia, Shaquan Puerta, PA-C  Chief Complaint  Patient presents with  . Urinary Tract Infection    polyuria, burns, chills, started 05/01, drank cranberry juice, taken cranberry pills    Subjective:   Natasha Hernandez is a 78 y.o. female, presents to clinic with CC of dysuria x 6 d Dysuria x 6 days, constant with each urination, no improvement with AZO, cranberry juice and pills.  Last AZO was 2 days ago, urine color has returned to normal.  Has T1DM sugars elevated this am to around 200.   She denies associated abdominal pain, flank pain, loss of appetite, confusion, trouble with balance. Natasha Hernandez here with her son who helps give history, agrees Natasha Hernandez is at her baseline. No UTI sx since she was a teenager  Urinary Tract Infection   This is a new problem. The current episode started in the past 7 days. The problem occurs every urination. The problem has been unchanged. The quality of the pain is described as burning. The pain is mild. There has been no fever. She is not sexually active. There is no history of pyelonephritis. Associated symptoms include frequency. Pertinent negatives include no chills, discharge, flank pain, hematuria, hesitancy, nausea, sweats, urgency or vomiting. The treatment provided no relief. There is no history of kidney stones, recurrent UTIs, a single kidney, urinary stasis or a urological procedure.    Natasha Hernandez also needs to transition care to me as new PCP, her PCP recently left.  She established with our clinic in 2018, had only one initial visit to establish care, and has not been seen since.  She has several specialist who she seen regularly, including endocrine and cardiology.  She did labs within the last week.  Per chart review she has not done a MWV in the past couple years.   She "tries to take a few shots as possible" but states last year Dr. Lucianne MussKumar, her endocrinologist, did convince her  to get the flu shot.  She states she has gotten pneumovax before.  But not shingles shot.    We discussed MWV today, I reviewed with her health maintenance in chart and a handout on what is included/done with MWV, encouraged her to schedule in the next couple months (after COVID when able to do routine well care) so that MWV will help her get established and give time to address routine care, preventative care, HM etc.   Patient Active Problem List   Diagnosis Date Noted  . Chest pain, rule out acute myocardial infarction 04/16/2018  . AKI (acute kidney injury) (HCC) 04/16/2018  . CAD (coronary artery disease) 09/27/2016  . Hx of CABG 09/27/2016  . Right carotid artery occlusion 09/27/2016  . PAD (peripheral artery disease) (HCC) 09/27/2016  . Essential hypertension 07/10/2016  . Uncontrolled type 1 diabetes mellitus with hyperglycemia (HCC) 05/07/2016  . Pure hypercholesterolemia 05/07/2016  . 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) 05/07/2016     Prior to Admission medications   Medication Sig Start Date End Date Taking? Authorizing Provider  aspirin EC 81 MG tablet Take 1 tablet (81 mg total) by mouth daily. 03/25/17  Yes Iran OuchArida, Muhammad A, MD  Calcium-Magnesium-Vitamin D (CALCIUM MAGNESIUM PO) Take 1 tablet by mouth daily.   Yes [provider]  carvedilol (COREG) 6.25 MG tablet Take 1 tablet (6.25 mg total) by mouth 2 (two) times daily with  a meal. 02/22/18  Yes Reather Littler, MD  Coenzyme Q10 (CO Q 10 PO) Take 1 capsule by mouth daily.   Yes [provider]  Continuous Blood Gluc Receiver (FREESTYLE LIBRE READER) DEVI 1 Device by Does not apply route every 30 (thirty) days. 03/30/17  Yes Reather Littler, MD  Continuous Blood Gluc Sensor (FREESTYLE LIBRE SENSOR SYSTEM) MISC Inject 3 Devices into the skin every 14 (fourteen) days. Dx code- E10.65 NDC- [id number] 05/11/16  Yes Reather Littler, MD  ezetimibe (ZETIA) 10 MG tablet Take 1 tablet  (10 mg total) by mouth daily. 05/30/18  Yes Reather Littler, MD  glucose blood (FREESTYLE PRECISION NEO TEST) test strip Use Freestyle Precision Neo strips to check blood sugar 3 times daily. DX:E10.65 08/16/18  Yes Reather Littler, MD  Insulin Disposable Pump (OMNIPOD DASH 5 PACK) MISC 1 each by Does not apply route every 3 (three) days. Use as directed. 04/06/18  Yes Reather Littler, MD  insulin lispro (HUMALOG) 100 UNIT/ML injection Use a maximum of 40 units under the skin daily via omnipod insulin pump. DX:E10.65 06/28/18  Yes Reather Littler, MD  isosorbide mononitrate (IMDUR) 60 MG 24 hr tablet Take half a tablet in the morning (30 mg) and a whole tablet in the evening (60 mg) 08/03/18  Yes Iran Ouch, MD  lisinopril (PRINIVIL,ZESTRIL) 20 MG tablet Take 1 tablet (20 mg total) by mouth daily. 02/22/18  Yes Reather Littler, MD  Multiple Vitamin (MULTIVITAMIN) tablet Take 1 tablet by mouth daily.   Yes [provider]  nitroGLYCERIN (NITROLINGUAL) 0.4 MG/SPRAY spray Place 1 spray under the tongue every 5 (five) minutes x 3 doses as needed for chest pain (Call 911 if taking 3rd spray). 01/27/16  Yes Iran Ouch, MD  rosuvastatin (CRESTOR) 40 MG tablet Take 1 tablet (40 mg total) by mouth daily. 06/29/18  Yes Reather Littler, MD     No Known Allergies   Family History  Problem Relation Age of Onset  . Breast cancer Mother 49  . Stroke Mother   . Emphysema Father 3  . Heart attack Father      Social History   Socioeconomic History  . Marital status: Widowed    Spouse name: Not on file  . Number of children: Not on file  . Years of education: Not on file  . Highest education level: Not on file  Occupational History  . Occupation: Retired  Engineer, production  . Financial resource strain: Not on file  . Food insecurity:    Worry: Not on file    Inability: Not on file  . Transportation needs:    Medical: Not on file    Non-medical: Not on file  Tobacco Use  . Smoking status: Never Smoker   . Smokeless tobacco: Never Used  Substance and Sexual Activity  . Alcohol use: No  . Drug use: Not on file  . Sexual activity: Not on file  Lifestyle  . Physical activity:    Days per week: Not on file    Minutes per session: Not on file  . Stress: Not on file  Relationships  . Social connections:    Talks on phone: Not on file    Gets together: Not on file    Attends religious service: Not on file    Active member of club or organization: Not on file    Attends meetings of clubs or organizations: Not on file    Relationship status: Not on file  . Intimate partner violence:  Fear of current or ex partner: Not on file    Emotionally abused: Not on file    Physically abused: Not on file    Forced sexual activity: Not on file  Other Topics Concern  . Not on file  Social History Narrative   Natasha Hernandez lives w/ son, dtr-in-law and grandchildren.      Review of Systems  Constitutional: Negative.  Negative for chills.  HENT: Negative.   Eyes: Negative.   Respiratory: Negative.   Cardiovascular: Negative.   Gastrointestinal: Negative.  Negative for nausea and vomiting.  Endocrine: Negative.   Genitourinary: Positive for frequency. Negative for flank pain, hematuria, hesitancy and urgency.  Musculoskeletal: Negative.   Skin: Negative.   Allergic/Immunologic: Negative.   Neurological: Negative.   Hematological: Negative.   Psychiatric/Behavioral: Negative.   All other systems reviewed and are negative.      Objective:    Vitals:   08/24/18 0913  BP: 122/64  Pulse: 88  Resp: 18  Temp: 98.5 F (36.9 C)  SpO2: 99%  Weight: 161 lb 12.8 oz (73.4 kg)  Height:  (1.626 m)      Physical Exam Vitals signs and nursing note reviewed.  Constitutional:      General: She is not in acute distress.    Appearance: Normal appearance. She is well-developed and normal weight. She is not ill-appearing, toxic-appearing or diaphoretic.     Comments: Well appearing elderly female,  wearing mask  HENT:     Head: Normocephalic and atraumatic.     Right Ear: External ear normal.     Left Ear: External ear normal.     Mouth/Throat:     Pharynx: Uvula midline.  Eyes:     General: Lids are normal. No scleral icterus.    Conjunctiva/sclera: Conjunctivae normal.     Pupils: Pupils are equal, round, and reactive to light.  Neck:     Musculoskeletal: Normal range of motion.     Trachea: Phonation normal. No tracheal deviation.  Cardiovascular:     Rate and Rhythm: Normal rate and regular rhythm.     Pulses: Normal pulses.          Radial pulses are 2+ on the right side and 2+ on the left side.       Posterior tibial pulses are 2+ on the right side and 2+ on the left side.     Heart sounds: Normal heart sounds. No murmur. No friction rub. No gallop.   Pulmonary:     Effort: Pulmonary effort is normal. No respiratory distress.     Breath sounds: Normal breath sounds. No stridor. No wheezing, rhonchi or rales.  Chest:     Chest wall: No tenderness.  Abdominal:     General: Bowel sounds are normal. There is no distension.     Palpations: Abdomen is soft.     Tenderness: There is no abdominal tenderness. There is no right CVA tenderness, left CVA tenderness, guarding or rebound.  Musculoskeletal:     Right lower leg: No edema.     Left lower leg: No edema.  Skin:    General: Skin is warm and dry.     Coloration: Skin is not pale.     Findings: No rash.  Neurological:     Mental Status: She is alert.     Motor: No abnormal muscle tone.     Gait: Gait normal.  Psychiatric:        Mood and Affect: Mood normal.  Speech: Speech normal.        Behavior: Behavior normal.        Thought Content: Thought content normal.     Results for orders placed or performed in visit on 08/24/18  Urinalysis, Routine w reflex microscopic  Result Value Ref Range   Color, Urine YELLOW YELLOW   APPearance CLOUDY (A) CLEAR   Specific Gravity, Urine 1.020 1.001 - 1.03   pH 6.0  5.0 - 8.0   Glucose, UA NEGATIVE NEGATIVE   Bilirubin Urine NEGATIVE NEGATIVE   Ketones, ur NEGATIVE NEGATIVE   Hgb urine dipstick 2+ (A) NEGATIVE   Protein, ur 2+ (A) NEGATIVE   Nitrite NEGATIVE NEGATIVE   Leukocytes,Ua 3+ (A) NEGATIVE   WBC, UA PACKED (A) 0 - 5 /HPF   RBC / HPF 3-10 (A) 0 - 2 /HPF   Squamous Epithelial / LPF 0-5 < OR = 5 /HPF   Bacteria, UA FEW (A) NONE SEEN /HPF   Hyaline Cast NONE SEEN NONE SEEN /LPF  Microscopic Message  Result Value Ref Range   Note           Assessment & Plan:      ICD-10-CM   1. Dysuria R30.0 Urinalysis, Routine w reflex microscopic    Urine Culture    cephALEXin (KEFLEX) 500 MG capsule    Microscopic Message    78 y/o female with PMHx of type 1 DM on insulin pump, presents with dysuria x 6 days.  No hx of recurrent UTI.  Urine + here, culture added.  Tx empirically with keflex, will f/up with Natasha Hernandez with culture results.  Reviewed med hx with Natasha Hernandez and with her son, had extensive discussion about PCP role and MWV, have given patient handouts and home questionnare for MWV, encouraged to schedule for a few months from now to be able to help further establish Natasha Hernandez here and with me taking over as PCP since her PCP/PA left practice last year.     Danelle Berry, PA-C 08/24/18 9:41 AM

## 2018-08-26 LAB — URINE CULTURE
MICRO NUMBER:: 454014
SPECIMEN QUALITY:: ADEQUATE

## 2018-09-18 ENCOUNTER — Ambulatory Visit (INDEPENDENT_AMBULATORY_CARE_PROVIDER_SITE_OTHER): Payer: Medicare Other | Admitting: Orthotics

## 2018-09-18 ENCOUNTER — Other Ambulatory Visit: Payer: Self-pay

## 2018-09-18 DIAGNOSIS — E1142 Type 2 diabetes mellitus with diabetic polyneuropathy: Secondary | ICD-10-CM | POA: Diagnosis not present

## 2018-09-18 DIAGNOSIS — M2041 Other hammer toe(s) (acquired), right foot: Secondary | ICD-10-CM

## 2018-09-18 DIAGNOSIS — L97521 Non-pressure chronic ulcer of other part of left foot limited to breakdown of skin: Secondary | ICD-10-CM | POA: Diagnosis not present

## 2018-09-18 DIAGNOSIS — M2042 Other hammer toe(s) (acquired), left foot: Secondary | ICD-10-CM | POA: Diagnosis not present

## 2018-09-18 DIAGNOSIS — Q828 Other specified congenital malformations of skin: Secondary | ICD-10-CM | POA: Diagnosis not present

## 2018-09-18 NOTE — Progress Notes (Signed)

## 2018-09-27 ENCOUNTER — Other Ambulatory Visit: Payer: Self-pay | Admitting: Endocrinology

## 2018-09-27 DIAGNOSIS — E7800 Pure hypercholesterolemia, unspecified: Secondary | ICD-10-CM

## 2018-09-29 ENCOUNTER — Encounter: Payer: Self-pay | Admitting: Podiatry

## 2018-09-29 ENCOUNTER — Other Ambulatory Visit: Payer: Self-pay

## 2018-09-29 ENCOUNTER — Ambulatory Visit (INDEPENDENT_AMBULATORY_CARE_PROVIDER_SITE_OTHER): Payer: Medicare Other | Admitting: Podiatry

## 2018-09-29 DIAGNOSIS — E1142 Type 2 diabetes mellitus with diabetic polyneuropathy: Secondary | ICD-10-CM | POA: Diagnosis not present

## 2018-09-29 DIAGNOSIS — B351 Tinea unguium: Secondary | ICD-10-CM | POA: Insufficient documentation

## 2018-09-29 DIAGNOSIS — L84 Corns and callosities: Secondary | ICD-10-CM | POA: Insufficient documentation

## 2018-09-29 DIAGNOSIS — E1169 Type 2 diabetes mellitus with other specified complication: Secondary | ICD-10-CM | POA: Diagnosis not present

## 2018-09-29 NOTE — Progress Notes (Signed)
Complaint:  Visit Type: Patient returns to my office for continued preventative foot care services. Complaint: Patient states" my nails have grown long and thick and become painful to walk and wear shoes" Patient has been diagnosed with DM with neuropathy.  Patient has callus left  feet.. The patient presents for preventative foot care services. No changes to ROS.  Patient is working with Liliane Channel for her diabetic shoes.  Podiatric Exam: Vascular: dorsalis pedis and posterior tibial pulses are palpable bilateral. Capillary return is immediate. Temperature gradient is WNL. Skin turgor WNL  Sensorium: Diminished  Semmes Weinstein monofilament test. Normal tactile sensation bilaterally. Nail Exam: Pt has thick disfigured discolored nails with subungual debris noted hallux  Bilateral. Ulcer Exam: There is no evidence of ulcer or pre-ulcerative changes or infection. Orthopedic Exam: Muscle tone and strength are WNL. No limitations in general ROM. No crepitus or effusions noted. Foot type and digits show no abnormalities. HAV 1st MPJ  B/L.  Hammer toes  B/L Skin:  Porokeratosis   sub 2 left.. No infection or ulcers  Diagnosis:  Onychomycosis, , Pain in right toe, pain in left toes,  Preulcerous callus  Left foot.  Treatment & Plan Procedures and Treatment: Consent by patient was obtained for treatment procedures.   Debridement of mycotic and hypertrophic toenails, 1 through 5 bilateral and clearing of subungual debris. No ulceration, no infection noted. Debridement of pre-ulcerous sub 2 lesion left foot. Return Visit-Office Procedure: Patient instructed to return to the office for a follow up visit 3 months for continued evaluation and treatment.    Gardiner Barefoot DPM

## 2018-10-02 ENCOUNTER — Other Ambulatory Visit: Payer: Self-pay

## 2018-10-02 ENCOUNTER — Ambulatory Visit: Payer: Medicare Other | Admitting: Orthotics

## 2018-10-02 DIAGNOSIS — L84 Corns and callosities: Secondary | ICD-10-CM

## 2018-10-02 DIAGNOSIS — Q828 Other specified congenital malformations of skin: Secondary | ICD-10-CM

## 2018-10-02 DIAGNOSIS — M2041 Other hammer toe(s) (acquired), right foot: Secondary | ICD-10-CM

## 2018-10-02 DIAGNOSIS — E1142 Type 2 diabetes mellitus with diabetic polyneuropathy: Secondary | ICD-10-CM

## 2018-10-02 NOTE — Progress Notes (Signed)
Patient came in to exchange shoes; the orthofeet isn't working out as she had a prominent hammertoe that is getting rubbed by top of toebox.Martin Majestic back to [employer].

## 2018-10-16 ENCOUNTER — Ambulatory Visit: Payer: Medicare Other | Admitting: Orthotics

## 2018-10-16 ENCOUNTER — Other Ambulatory Visit: Payer: Self-pay

## 2018-10-16 DIAGNOSIS — E1142 Type 2 diabetes mellitus with diabetic polyneuropathy: Secondary | ICD-10-CM

## 2018-10-16 DIAGNOSIS — Q828 Other specified congenital malformations of skin: Secondary | ICD-10-CM

## 2018-10-16 DIAGNOSIS — L84 Corns and callosities: Secondary | ICD-10-CM

## 2018-10-16 NOTE — Progress Notes (Signed)
Shoes fit well

## 2018-10-23 ENCOUNTER — Other Ambulatory Visit (INDEPENDENT_AMBULATORY_CARE_PROVIDER_SITE_OTHER): Payer: Medicare Other

## 2018-10-23 ENCOUNTER — Other Ambulatory Visit: Payer: Self-pay

## 2018-10-23 DIAGNOSIS — E7800 Pure hypercholesterolemia, unspecified: Secondary | ICD-10-CM | POA: Diagnosis not present

## 2018-10-23 DIAGNOSIS — E1065 Type 1 diabetes mellitus with hyperglycemia: Secondary | ICD-10-CM | POA: Diagnosis not present

## 2018-10-24 LAB — LIPID PANEL
Cholesterol: 113 mg/dL (ref 0–200)
HDL: 63.2 mg/dL (ref >39.00)
LDL Cholesterol: 37 mg/dL (ref 0–99)
NonHDL: 50.09
Total CHOL/HDL Ratio: 2
Triglycerides: 66 mg/dL (ref 0.0–149.0)
VLDL: 13.2 mg/dL (ref 0.0–40.0)

## 2018-10-24 LAB — BASIC METABOLIC PANEL
BUN: 25 mg/dL — ABNORMAL HIGH (ref 6–23)
CO2: 30 mEq/L (ref 19–32)
Calcium: 9.4 mg/dL (ref 8.4–10.5)
Chloride: 97 mEq/L (ref 96–112)
Creatinine, Ser: 1.07 mg/dL (ref 0.40–1.20)
GFR: 49.78 mL/min — ABNORMAL LOW (ref >60.00)
Glucose, Bld: 208 mg/dL — ABNORMAL HIGH (ref 70–99)
Potassium: 4.3 mEq/L (ref 3.5–5.1)
Sodium: 135 mEq/L (ref 135–145)

## 2018-10-24 LAB — HEMOGLOBIN A1C: Hgb A1c MFr Bld: 7.9 % — ABNORMAL HIGH (ref 4.6–6.5)

## 2018-10-24 LAB — TSH: TSH: 3.7 u[IU]/mL (ref 0.35–4.50)

## 2018-10-26 ENCOUNTER — Other Ambulatory Visit: Payer: Self-pay

## 2018-10-27 ENCOUNTER — Encounter: Payer: Self-pay | Admitting: Endocrinology

## 2018-10-27 ENCOUNTER — Ambulatory Visit (INDEPENDENT_AMBULATORY_CARE_PROVIDER_SITE_OTHER): Payer: Medicare Other | Admitting: Endocrinology

## 2018-10-27 VITALS — BP 80/40 | HR 70 | Ht 64.0 in | Wt 154.8 lb

## 2018-10-27 DIAGNOSIS — E1065 Type 1 diabetes mellitus with hyperglycemia: Secondary | ICD-10-CM

## 2018-10-27 DIAGNOSIS — I2510 Atherosclerotic heart disease of native coronary artery without angina pectoris: Secondary | ICD-10-CM

## 2018-10-27 DIAGNOSIS — I952 Hypotension due to drugs: Secondary | ICD-10-CM

## 2018-10-27 NOTE — Progress Notes (Signed)
Patient ID: Natasha Hernandez, female   DOB: 10/31/1940, 78 y.o.   MRN: [id number]            Reason for Appointment : Follow-up for Type 1 Diabetes  History of Present Illness          Diagnosis: Type 1 diabetes mellitus, date of diagnosis: 1953          Previous history:  She thinks she has been on various insulin regimens over the years but has been on Lantus for 10-12 years along with mealtime insulin Previously followed by an endocrinologist but no records available. Her A1c has usually been around 7% reportedly and was 7.2 in 7/16   Recent history:    INSULIN PUMP settings BASAL rate midnight = 0.45, 5 AM = 0.55.  9 PM =  0.45 with total basal 12.4 Carbohydrate coverage 1: 5, sensitivity 1: 60 and active insulin 4 hours. Blood sugar target 135-140  A1c is 7.9  Current management, blood sugar patterns and problems identified:     Her blood sugars have been much higher over the last 2 or 3 weeks and she does not know why  She says that she is not wanting to eat much because of fear of hyperglycemia and in the last few days has had minimal carbohydrate intake  Although she did have readings close to 400 on a couple of days this did not appear to be obviously related to pump malfunction even though the port was changed  HYPOGLYCEMIA has not occurred  Highest blood sugars on average are about 2-3 PM  In the last couple of days her blood sugars have been very little more stable and averaging about 150 yesterday also has been having minimal intake  BOLUSES: She thinks that if her blood sugars are near normal she will get hypoglycemia unless she boluses after starting the meal or after and likes to otherwise bolus right before eating  Glucose monitoring:  is being done several times a day         Glucometer: Freestyle libre  Blood Glucose analysis and averages from freestyle libre   CONTINUOUS GLUCOSE MONITORING RECORD INTERPRETATION    Dates of Recording: 6/27  through 7/10  Sensor description: [employer] libre  Results statistics:   CGM use % of time  97  Average and SD  197, GV 37  Time in range       53 %  % Time Above 180  26  % Time above 250  21  % Time Below target  0    Glycemic patterns summary: Blood sugars have been significantly variable from day-to-day and were very consistently high on 7/1 and also mostly on 7/2 and 7/5 Otherwise no consistent pattern is seen except blood sugars on average above the target range mostly in the early afternoon  Hyperglycemic episodes have occurred at all different times with no consistent pattern  Hypoglycemic episodes have not occurred  Overnight periods: Blood sugars are variably high but usually gradually improving early morning around 6-7 AM and occasionally near normal at waking up time  Preprandial periods:   Blood sugars are averaging 172 in the morning, about 200 at lunch and 190 at dinnertime  Postprandial periods:   After breakfast: She has a mild increase in blood sugars but only about 20 mg After lunch: Blood sugars are variably high on at least 2 days but not consistently After dinner: No consistent pattern is seen with significant rise in blood sugar on  7/7   PRE-MEAL Fasting Lunch Dinner Bedtime Overall  Glucose range:       Mean/median:  172  200  189   197   POST-MEAL PC Breakfast PC Lunch PC Dinner  Glucose range:     Mean/median:  190  228  188    PREVIOUS data:  CGM use % of time  98  2-week average/SD  149+/-33  Time in range        75 %  % Time Above 180  16  % Time above 250  4  % Time Below 70  5     PRE-MEAL Fasting Lunch Dinner Bedtime Overall  Glucose range:       Averages:  129  143  154  161  149   POST-MEAL PC Breakfast PC Lunch PC Dinner  Glucose range:     Averages:  133  152  159      Factors causing Hypoglycemia: Overestimating mealtime coverage, exercise  Symptoms of hypoglycemia: Some weakness, confusion at times Treatment of  hypoglycemia: Corn syrup or jellybeans/other sweets.  She will get 15 g of carbohydrate  Self-care: The diet that the patient has been following is: Carbohydrate counting  Mealtimes are: Breakfast AT 8.30 AM Dinner: 7.30   For breakfast will have toast with cheese or egg, sometimes cereal and egg  Exercise: walking and that time will use her exercise bike        Dietician consultation: Most recent: Years ago .         CDE consultation: 04/2018   Wt Readings from Last 3 Encounters:  10/27/18 154 lb 12.8 oz (70.2 kg)  08/24/18 161 lb 12.8 oz (73.4 kg)  08/18/18 162 lb 6.4 oz (73.7 kg)    Lab Results  Component Value Date   HGBA1C 7.9 (H) 10/23/2018   HGBA1C 7.7 (A) 06/19/2018   HGBA1C 7.6 (H) 02/20/2018   Lab Results  Component Value Date   MICROALBUR 3.7 (H) 08/18/2018   LDLCALC 37 10/23/2018   CREATININE 1.07 10/23/2018    Lab Results  Component Value Date   MICRALBCREAT 4.5 08/18/2018   MICRALBCREAT 1.1 06/06/2017     Allergies as of 10/27/2018   No Known Allergies     Medication List       Accurate as of October 27, 2018  9:37 AM. If you have any questions, ask your nurse or doctor.        aspirin EC 81 MG tablet Take 1 tablet (81 mg total) by mouth daily.   CALCIUM MAGNESIUM PO Take 1 tablet by mouth daily.   carvedilol 6.25 MG tablet Commonly known as: COREG Take 1 tablet (6.25 mg total) by mouth 2 (two) times daily with a meal.   CO Q 10 PO Take 1 capsule by mouth daily.   ezetimibe 10 MG tablet Commonly known as: ZETIA Take 1 tablet (10 mg total) by mouth daily.   FreeStyle Libre Reader Devi 1 Device by Does not apply route every 30 (thirty) days.   FreeStyle Libre Sensor System Misc Inject 3 Devices into the skin every 14 (fourteen) days. Dx code- E10.65 NDC- [id number]   glucose blood test strip Commonly known as: FreeStyle Precision Neo Test Use Freestyle Precision Neo strips to check blood sugar 3 times daily. DX:E10.65   insulin  lispro 100 UNIT/ML injection Commonly known as: HUMALOG Use a maximum of 40 units under the skin daily via omnipod insulin pump. DX:E10.65   isosorbide mononitrate 60 MG 24  hr tablet Commonly known as: IMDUR Take half a tablet in the morning (30 mg) and a whole tablet in the evening (60 mg)   lisinopril 20 MG tablet Commonly known as: ZESTRIL Take 1 tablet (20 mg total) by mouth daily.   multivitamin tablet Take 1 tablet by mouth daily.   nitroGLYCERIN 0.4 MG/SPRAY spray Commonly known as: Nitrolingual Place 1 spray under the tongue every 5 (five) minutes x 3 doses as needed for chest pain (Call 911 if taking 3rd spray).   OmniPod Dash 5 Pack Pods Misc 1 each by Does not apply route every 3 (three) days. Use as directed.   rosuvastatin 40 MG tablet Commonly known as: CRESTOR Take 1 tablet (40 mg total) by mouth daily.       Allergies: No Known Allergies  Past Medical History:  Diagnosis Date  . Carotid artery occlusion   . Coronary artery disease    CABG in 1996. Most recent cardiac catheterization in 2013 showed patent grafts including LIMA to LAD, SVG to OM, SVG to diagonal and SVG to PDA.  . Diabetes mellitus without complication (HCC)   . Hyperlipidemia   . Hypertension   . PAD (peripheral artery disease) (HCC)     Past Surgical History:  Procedure Laterality Date  . CORONARY ARTERY BYPASS GRAFT  1996    Family History  Problem Relation Age of Onset  . Breast cancer Mother 56  . Stroke Mother   . Emphysema Father 85  . Heart attack Father     Social History:  reports that she has never smoked. She has never used smokeless tobacco. She reports that she does not drink alcohol. No history on file for drug.      Review of Systems                                                            Lipids:  She is on Crestor 40 mg daily and since 7/19 also on Zetia  Has history of CAD  several years ago with coronary bypass LDL is controlled adequately   Lab  Results  Component Value Date   CHOL 113 10/23/2018   HDL 63.20 10/23/2018   LDLCALC 37 10/23/2018   TRIG 66.0 10/23/2018   CHOLHDL 2 10/23/2018    Has had proliferative retinopathy, has been going regularly for exams  Neuropathy with some sensory loss: Forms filled out for diabetic shoes previously  HYPERTENSION:  This is treated with lisinopril 20 mg daily along with low-dose Coreg She felt dizzy even with sitting in the office waiting room today She was feeling better after drinking water in the office Usually not checking blood pressure at home  BP Readings from Last 3 Encounters:  10/27/18 (!) 80/40  08/24/18 122/64  08/18/18 110/74     LABS:  Lab on 10/23/2018  Component Date Value Ref Range Status  . TSH 10/23/2018 3.70  0.35 - 4.50 uIU/mL Final  . Cholesterol 10/23/2018 113  0 - 200 mg/dL Final   ATP III Classification       Desirable:  < 200 mg/dL               Borderline High:  200 - 239 mg/dL          High:  > = 478  mg/dL  . Triglycerides 10/23/2018 66.0  0.0 - 149.0 mg/dL Final   Normal:  <150 mg/dLBorderline High:  150 - 199 mg/dL  . HDL 10/23/2018 63.20  >39.00 mg/dL Final  . VLDL 10/23/2018 13.2  0.0 - 40.0 mg/dL Final  . LDL Cholesterol 10/23/2018 37  0 - 99 mg/dL Final  . Total CHOL/HDL Ratio 10/23/2018 2   Final                  Men          Women1/2 Average Risk     3.4          3.3Average Risk          5.0          4.42X Average Risk          9.6          7.13X Average Risk          15.0          11.0                      . NonHDL 10/23/2018 50.09   Final   NOTE:  Non-HDL goal should be 30 mg/dL higher than patient's LDL goal (i.e. LDL goal of < 70 mg/dL, would have non-HDL goal of < 100 mg/dL)  . Sodium 10/23/2018 135  135 - 145 mEq/L Final  . Potassium 10/23/2018 4.3  3.5 - 5.1 mEq/L Final  . Chloride 10/23/2018 97  96 - 112 mEq/L Final  . CO2 10/23/2018 30  19 - 32 mEq/L Final  . Glucose, Bld 10/23/2018 208* 70 - 99 mg/dL Final  . BUN 10/23/2018  25* 6 - 23 mg/dL Final  . Creatinine, Ser 10/23/2018 1.07  0.40 - 1.20 mg/dL Final  . Calcium 10/23/2018 9.4  8.4 - 10.5 mg/dL Final  . GFR 10/23/2018 49.78* >60.00 mL/min Final  . Hgb A1c MFr Bld 10/23/2018 7.9* 4.6 - 6.5 % Final   Glycemic Control Guidelines for People with Diabetes:Non Diabetic:  <6%Goal of Therapy: <7%Additional Action Suggested:  >8%      Physical Examination:  BP (!) 80/40 (BP Location: Right Arm, Patient Position: Sitting, Cuff Size: Normal)   Pulse 70   Ht 5\' 4"  (1.626 m)   Wt 154 lb 12.8 oz (70.2 kg)   SpO2 97%   BMI 26.57 kg/m      ASSESSMENT:  Diabetes type 1, long-standing  A1c is 7.9 and about the same but gradually increasing  See history of present illness for description of current diabetes management, blood sugar patterns and problems identified  She is on an Omni pod insulin pump with Humalog insulin  Not clear why her blood sugars are running much higher recently Detailed analysis of her CGM as discussed above Her blood sugar in the lab appears to be relatively close to the freestyle libre She is showing inconsistent blood sugar patterns from day-to-day However no hypoglycemia  Currently she is trying to reduce her food intake because of fear of raising her blood sugar and has had minimal carbohydrate intake the last couple of days Also losing weight   Hypertension: Her blood pressure is much lower than usual and may be related to her decreased intake recently  LIPIDS: Well controlled  Symptoms of itching on her arms: She will need to discuss with PCP  PLAN:   Her basal rate will be increased for now However she was told to start  eating normally and bolus for what ever she is eating She can try to bolus right before eating as she feels better about preventing hypoglycemia with this She will also change her correction factor to 1: 50.9 Call if blood sugars are persistently high  New basal rates will be Midnight = 0.5, 5 AM =  0.65, 12 noon = 0.70, 6 PM = 0.60 and 9 PM = 0.55  She will cut her lisinopril in half until seen by PCP To monitor blood pressure at home  Counseling time on subjects discussed in assessment and plan sections is over 50% of today's 25 minute visit   There are no Patient Instructions on file for this visit.     Reather LittlerAjay Boris Engelmann 10/27/2018, 9:37 AM   Note: This note was prepared with Dragon voice recognition system technology. Any transcriptional errors that result from this process are unintentional.

## 2018-10-27 NOTE — Patient Instructions (Signed)
Lisinopril 1/2 daily

## 2018-10-27 NOTE — Progress Notes (Signed)
Can you please call this pt and check on her?  Her BP was really low and she was symptomatic.  She will need to hold her lisinopril and monitor her BP and we need to see her early next week.  Goal BP 100/60- 130/80, today at Dr. Visit 80/40 and dizzy.  If dizzy, feels like shes going to pass out, then needs to go to the ER if not getting better with holding meds and pushing fluids

## 2018-11-01 ENCOUNTER — Telehealth: Payer: Self-pay

## 2018-11-01 NOTE — Telephone Encounter (Signed)
Well she needs to take her BP meds again and only hold them for the day or two if her BP is low like it was at her other Dr visit (80/40 and she was lightheaded).  Have her come in to be checked if BP <100/60 or remains >160/100 or symptomatic either way.  Thanks

## 2018-11-01 NOTE — Telephone Encounter (Signed)
Pt daughter Velna Hatchet) was notified. Verbalizes understanding.

## 2018-11-01 NOTE — Telephone Encounter (Signed)
BP reading:  07/11   118/68  07/13    129/62  07/14    173/93  Hasn't taken meds since 07/10.

## 2018-11-07 ENCOUNTER — Telehealth: Payer: Self-pay | Admitting: Endocrinology

## 2018-11-07 NOTE — Telephone Encounter (Signed)
Brandi (patient's granddaughter) called re: patient gets medications through pill pack mail service but patient has been out of the following medications for approx. 3 days:  carvedilol (COREG) 6.25 MG tablet , AND ezetimibe (ZETIA) 10 MG tablet AND isosorbide mononitrate (IMDUR) 60 MG 24 hr tablet, AND rosuvastatin (CRESTOR) 40 MG tablet Brandi is inquiring if there is any way to get a 2 or 3 day supply of the 4 above medications listed above sent to:  [id number] [place] [address], [place] [phone] (Phone) [phone] (Fax)   Please call Velna Hatchet to advise at ph# [phone].

## 2018-11-08 ENCOUNTER — Other Ambulatory Visit: Payer: Self-pay

## 2018-11-08 DIAGNOSIS — E7800 Pure hypercholesterolemia, unspecified: Secondary | ICD-10-CM

## 2018-11-08 MED ORDER — CARVEDILOL 6.25 MG PO TABS: 6.2500 mg | ORAL_TABLET | Freq: Two times a day (BID) | ORAL | 0 refills | Status: DC

## 2018-11-08 MED ORDER — EZETIMIBE 10 MG PO TABS: 10.0000 mg | ORAL_TABLET | Freq: Every day | ORAL | 0 refills | Status: DC

## 2018-11-08 MED ORDER — ROSUVASTATIN CALCIUM 40 MG PO TABS: ORAL_TABLET | ORAL | 0 refills | Status: DC

## 2018-11-08 NOTE — Telephone Encounter (Signed)
Called pt's granddaughter and informed her that the three medications that Dr. Dwyane Dee prescribe have been sent to the pharmacy requested, but Imdur is not prescribed by Dr. Dwyane Dee. Brandi verbalized understanding.

## 2018-11-13 ENCOUNTER — Encounter: Payer: Self-pay | Admitting: Podiatry

## 2018-11-13 ENCOUNTER — Ambulatory Visit (INDEPENDENT_AMBULATORY_CARE_PROVIDER_SITE_OTHER): Payer: Medicare Other

## 2018-11-13 ENCOUNTER — Other Ambulatory Visit: Payer: Self-pay

## 2018-11-13 ENCOUNTER — Ambulatory Visit (INDEPENDENT_AMBULATORY_CARE_PROVIDER_SITE_OTHER): Payer: Medicare Other | Admitting: Podiatry

## 2018-11-13 ENCOUNTER — Ambulatory Visit: Payer: Medicare Other | Admitting: Orthotics

## 2018-11-13 DIAGNOSIS — E1142 Type 2 diabetes mellitus with diabetic polyneuropathy: Secondary | ICD-10-CM

## 2018-11-13 DIAGNOSIS — E08621 Diabetes mellitus due to underlying condition with foot ulcer: Secondary | ICD-10-CM

## 2018-11-13 DIAGNOSIS — L97521 Non-pressure chronic ulcer of other part of left foot limited to breakdown of skin: Secondary | ICD-10-CM

## 2018-11-13 DIAGNOSIS — L84 Corns and callosities: Secondary | ICD-10-CM

## 2018-11-13 DIAGNOSIS — Q828 Other specified congenital malformations of skin: Secondary | ICD-10-CM

## 2018-11-13 MED ORDER — MUPIROCIN 2 % EX OINT: TOPICAL_OINTMENT | CUTANEOUS | 0 refills | Status: AC

## 2018-11-13 MED ORDER — AMOXICILLIN-POT CLAVULANATE 500-125 MG PO TABS: 1.0000 | ORAL_TABLET | Freq: Three times a day (TID) | ORAL | 0 refills | Status: AC

## 2018-11-13 NOTE — Patient Instructions (Signed)
Diabetes Mellitus and Foot Care Foot care is an important part of your health, especially when you have diabetes. Diabetes may cause you to have problems because of poor blood flow (circulation) to your feet and legs, which can cause your skin to:  Become thinner and drier.  Break more easily.  Heal more slowly.  Peel and crack. You may also have nerve damage (neuropathy) in your legs and feet, causing decreased feeling in them. This means that you may not notice minor injuries to your feet that could lead to more serious problems. Noticing and addressing any potential problems early is the best way to prevent future foot problems. How to care for your feet Foot hygiene  Wash your feet daily with warm water and mild soap. Do not use hot water. Then, pat your feet and the areas between your toes until they are completely dry. Do not soak your feet as this can dry your skin.  Trim your toenails straight across. Do not dig under them or around the cuticle. File the edges of your nails with an emery board or nail file.  Apply a moisturizing lotion or petroleum jelly to the skin on your feet and to dry, brittle toenails. Use lotion that does not contain alcohol and is unscented. Do not apply lotion between your toes. Shoes and socks  Wear clean socks or stockings every day. Make sure they are not too tight. Do not wear knee-high stockings since they may decrease blood flow to your legs.  Wear shoes that fit properly and have enough cushioning. Always look in your shoes before you put them on to be sure there are no objects inside.  To break in new shoes, wear them for just a few hours a day. This prevents injuries on your feet. Wounds, scrapes, corns, and calluses  Check your feet daily for blisters, cuts, bruises, sores, and redness. If you cannot see the bottom of your feet, use a mirror or ask someone for help.  Do not cut corns or calluses or try to remove them with medicine.  If you  find a minor scrape, cut, or break in the skin on your feet, keep it and the skin around it clean and dry. You may clean these areas with mild soap and water. Do not clean the area with peroxide, alcohol, or iodine.  If you have a wound, scrape, corn, or callus on your foot, look at it several times a day to make sure it is healing and not infected. Check for: ? Redness, swelling, or pain. ? Fluid or blood. ? Warmth. ? Pus or a bad smell. General instructions  Do not cross your legs. This may decrease blood flow to your feet.  Do not use heating pads or hot water bottles on your feet. They may burn your skin. If you have lost feeling in your feet or legs, you may not know this is happening until it is too late.  Protect your feet from hot and cold by wearing shoes, such as at the beach or on hot pavement.  Schedule a complete foot exam at least once a year (annually) or more often if you have foot problems. If you have foot problems, report any cuts, sores, or bruises to your health care provider immediately. Contact a health care provider if:  You have a medical condition that increases your risk of infection and you have any cuts, sores, or bruises on your feet.  You have an injury that is not   healing.  You have redness on your legs or feet.  You feel burning or tingling in your legs or feet.  You have pain or cramps in your legs and feet.  Your legs or feet are numb.  Your feet always feel cold.  You have pain around a toenail. Get help right away if:  You have a wound, scrape, corn, or callus on your foot and: ? You have pain, swelling, or redness that gets worse. ? You have fluid or blood coming from the wound, scrape, corn, or callus. ? Your wound, scrape, corn, or callus feels warm to the touch. ? You have pus or a bad smell coming from the wound, scrape, corn, or callus. ? You have a fever. ? You have a red line going up your leg. Summary  Check your feet every day  for cuts, sores, red spots, swelling, and blisters.  Moisturize feet and legs daily.  Wear shoes that fit properly and have enough cushioning.  If you have foot problems, report any cuts, sores, or bruises to your health care provider immediately.  Schedule a complete foot exam at least once a year (annually) or more often if you have foot problems. This information is not intended to replace advice given to you by your health care provider. Make sure you discuss any questions you have with your health care provider. Document Released: 04/02/2000 Document Revised: 05/18/2017 Document Reviewed: 05/07/2016 Elsevier Patient Education  2020 [employer]  Corns and Calluses Corns are small areas of thickened skin that occur on the top, sides, or tip of a toe. They contain a cone-shaped core with a point that can press on a nerve below. This causes pain.  Calluses are areas of thickened skin that can occur anywhere on the body, including the hands, fingers, palms, soles of the feet, and heels. Calluses are usually larger than corns. What are the causes? Corns and calluses are caused by rubbing (friction) or pressure, such as from shoes that are too tight or do not fit properly. What increases the risk? Corns are more likely to develop in people who have misshapen toes (toe deformities), such as hammer toes. Calluses can occur with friction to any area of the skin. They are more likely to develop in people who:  Work with their hands.  Wear shoes that fit poorly, are too tight, or are high-heeled.  Have toe deformities. What are the signs or symptoms? Symptoms of a corn or callus include:  A hard growth on the skin.  Pain or tenderness under the skin.  Redness and swelling.  Increased discomfort while wearing tight-fitting shoes, if your feet are affected. If a corn or callus becomes infected, symptoms may include:  Redness and swelling that gets worse.  Pain.  Fluid, blood, or  pus draining from the corn or callus. How is this diagnosed? Corns and calluses may be diagnosed based on your symptoms, your medical history, and a physical exam. How is this treated? Treatment for corns and calluses may include:  Removing the cause of the friction or pressure. This may involve: ? Changing your shoes. ? Wearing shoe inserts (orthotics) or other protective layers in your shoes, such as a corn pad. ? Wearing gloves.  Applying medicine to the skin (topical medicine) to help soften skin in the hardened, thickened areas.  Removing layers of dead skin with a file to reduce the size of the corn or callus.  Removing the corn or callus with a scalpel or   laser.  Taking antibiotic medicines, if your corn or callus is infected.  Having surgery, if a toe deformity is the cause. Follow these instructions at home:   Take over-the-counter and prescription medicines only as told by your health care provider.  If you were prescribed an antibiotic, take it as told by your health care provider. Do not stop taking it even if your condition starts to improve.  Wear shoes that fit well. Avoid wearing high-heeled shoes and shoes that are too tight or too loose.  Wear any padding, protective layers, gloves, or orthotics as told by your health care provider.  Soak your hands or feet and then use a file or pumice stone to soften your corn or callus. Do this as told by your health care provider.  Check your corn or callus every day for symptoms of infection. Contact a health care provider if you:  Notice that your symptoms do not improve with treatment.  Have redness or swelling that gets worse.  Notice that your corn or callus becomes painful.  Have fluid, blood, or pus coming from your corn or callus.  Have new symptoms. Summary  Corns are small areas of thickened skin that occur on the top, sides, or tip of a toe.  Calluses are areas of thickened skin that can occur anywhere  on the body, including the hands, fingers, palms, and soles of the feet. Calluses are usually larger than corns.  Corns and calluses are caused by rubbing (friction) or pressure, such as from shoes that are too tight or do not fit properly.  Treatment may include wearing any padding, protective layers, gloves, or orthotics as told by your health care provider. This information is not intended to replace advice given to you by your health care provider. Make sure you discuss any questions you have with your health care provider. Document Released: 01/10/2004 Document Revised: 07/26/2018 Document Reviewed: 02/16/2017 Elsevier Patient Education  2020 [employer] Inc[employer]  

## 2018-11-13 NOTE — Progress Notes (Signed)
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

## 2018-11-15 ENCOUNTER — Ambulatory Visit: Payer: Medicare Other | Admitting: Podiatry

## 2018-11-15 NOTE — Progress Notes (Signed)
Subjective: Patient presents today for evaluation regarding her left foot callus. Patient was seen by our Pedorthist for shoe modification on today and Pedorthist was concerned about appearance of her lesion. Patient does relate pain when weightbearing. Per son, he occasionally files it down with a Ped-Egg and than does give her relief between visits, but it is especially tender today when weightbearing. She denies any fever, chills, nightsweats, nausea or vomiting.  Delsa Grana, PA-C is her PCP.   Medications reviewed in chart.  No Known Allergies   Objective:  Vascular Examination: Capillary refill time immediate x 10 digits.  Dorsalis pedis pulses palpable b/l.  Posterior tibial pulses palpable b/l.  Digital hair absent x 10 digits.  Skin temperature gradient WNL b/l.  Dermatological Examination: Skin thin and atrophic b/l.  Toenails 1-5 b/l recently debrided in June.       Ulceration located plantar aspect left foot submet head 2.  Predebridement measurements carried out today of 2.7 x 2.0 cm with calloused roof. There is visible subdermal hemorrhage. There is tenderness to palpation. There is no periwound erythema, no edema, no odor, no active drainage. Small area of flocculence noted at central area of her lesion.    Postdebridement measurements today are: 2.5 x 1.0 cm and it is partial thickness. There is no erythema, no edema, no odor, no tunneling/tracking, no undermining, no active drainage.  Musculoskeletal: Muscle strength 5/5 to all LE muscle groups.  HAV with bunion b/l.  Hammertoes b/l.  Neurological: Sensation diminished with 10 gram monofilament.  Vibratory sensation intact.  Xrays left foot: No bone erosion noted at area of ulceration. No gas in tissues +Vessel calcification leg Joint space narrowing 1st MPJ Hammertoe deformity lesser digits  Assessment: 1.  Diabetic partial thickness Ulceration left foot 2.  NIDDM with peripheral neuropathy   Plan: 1. Discussed ulceration and treatment plan with patient. It is partial thickness. Ulcer was debrided and reactive hyperkeratoses was resected to the level of bleeding or viable tissue. Ulcer was cleansed with wound cleanser. Iodosorb Gel was applied to base of wound with light dressing. 2. Xray of the left foot was performed and reviewed with patient. 3. Surgical shoe was dispensed for left foot. 4. Patient was given instructions on offloading and dressing change/aftercare and was instructed to call immediately if any signs or symptoms of infection arise. She is to apply Mupirocin Ointment (prescription sent to pharmacy) and band-aid to area once daily.Keep foot dry at all times. 5. Will cover her empirically with Augmentin 500 mg po tid x 10 days. 6. Patient instructed to report to emergency department with worsening appearance of ulcer/toe/foot, increased pain, foul odor, increased redness, swelling, drainage, fever, chills, nightsweats, nausea, vomiting, increased blood sugar.  7. Patient/POA related understanding. 8. Follow up one week. 9. Patient/POA to call should there be a concern in the interim.

## 2018-11-20 ENCOUNTER — Ambulatory Visit (INDEPENDENT_AMBULATORY_CARE_PROVIDER_SITE_OTHER): Payer: Medicare Other | Admitting: Podiatry

## 2018-11-20 ENCOUNTER — Encounter: Payer: Self-pay | Admitting: Podiatry

## 2018-11-20 ENCOUNTER — Other Ambulatory Visit: Payer: Self-pay

## 2018-11-20 ENCOUNTER — Ambulatory Visit: Payer: Medicare Other | Admitting: Orthotics

## 2018-11-20 VITALS — BP 110/51 | Temp 97.2°F

## 2018-11-20 DIAGNOSIS — E08621 Diabetes mellitus due to underlying condition with foot ulcer: Secondary | ICD-10-CM

## 2018-11-20 DIAGNOSIS — L84 Corns and callosities: Secondary | ICD-10-CM

## 2018-11-20 DIAGNOSIS — L97521 Non-pressure chronic ulcer of other part of left foot limited to breakdown of skin: Secondary | ICD-10-CM | POA: Diagnosis not present

## 2018-11-20 DIAGNOSIS — E1142 Type 2 diabetes mellitus with diabetic polyneuropathy: Secondary | ICD-10-CM | POA: Diagnosis not present

## 2018-11-20 NOTE — Progress Notes (Signed)
Adusted RT f/o's to add valgus wedge to keep her from rolling out.

## 2018-11-20 NOTE — Patient Instructions (Signed)
Diabetes Mellitus and Foot Care Foot care is an important part of your health, especially when you have diabetes. Diabetes may cause you to have problems because of poor blood flow (circulation) to your feet and legs, which can cause your skin to:  Become thinner and drier.  Break more easily.  Heal more slowly.  Peel and crack. You may also have nerve damage (neuropathy) in your legs and feet, causing decreased feeling in them. This means that you may not notice minor injuries to your feet that could lead to more serious problems. Noticing and addressing any potential problems early is the best way to prevent future foot problems. How to care for your feet Foot hygiene  Wash your feet daily with warm water and mild soap. Do not use hot water. Then, pat your feet and the areas between your toes until they are completely dry. Do not soak your feet as this can dry your skin.  Trim your toenails straight across. Do not dig under them or around the cuticle. File the edges of your nails with an emery board or nail file.  Apply a moisturizing lotion or petroleum jelly to the skin on your feet and to dry, brittle toenails. Use lotion that does not contain alcohol and is unscented. Do not apply lotion between your toes. Shoes and socks  Wear clean socks or stockings every day. Make sure they are not too tight. Do not wear knee-high stockings since they may decrease blood flow to your legs.  Wear shoes that fit properly and have enough cushioning. Always look in your shoes before you put them on to be sure there are no objects inside.  To break in new shoes, wear them for just a few hours a day. This prevents injuries on your feet. Wounds, scrapes, corns, and calluses  Check your feet daily for blisters, cuts, bruises, sores, and redness. If you cannot see the bottom of your feet, use a mirror or ask someone for help.  Do not cut corns or calluses or try to remove them with medicine.  If you  find a minor scrape, cut, or break in the skin on your feet, keep it and the skin around it clean and dry. You may clean these areas with mild soap and water. Do not clean the area with peroxide, alcohol, or iodine.  If you have a wound, scrape, corn, or callus on your foot, look at it several times a day to make sure it is healing and not infected. Check for: ? Redness, swelling, or pain. ? Fluid or blood. ? Warmth. ? Pus or a bad smell. General instructions  Do not cross your legs. This may decrease blood flow to your feet.  Do not use heating pads or hot water bottles on your feet. They may burn your skin. If you have lost feeling in your feet or legs, you may not know this is happening until it is too late.  Protect your feet from hot and cold by wearing shoes, such as at the beach or on hot pavement.  Schedule a complete foot exam at least once a year (annually) or more often if you have foot problems. If you have foot problems, report any cuts, sores, or bruises to your health care provider immediately. Contact a health care provider if:  You have a medical condition that increases your risk of infection and you have any cuts, sores, or bruises on your feet.  You have an injury that is not   healing.  You have redness on your legs or feet.  You feel burning or tingling in your legs or feet.  You have pain or cramps in your legs and feet.  Your legs or feet are numb.  Your feet always feel cold.  You have pain around a toenail. Get help right away if:  You have a wound, scrape, corn, or callus on your foot and: ? You have pain, swelling, or redness that gets worse. ? You have fluid or blood coming from the wound, scrape, corn, or callus. ? Your wound, scrape, corn, or callus feels warm to the touch. ? You have pus or a bad smell coming from the wound, scrape, corn, or callus. ? You have a fever. ? You have a red line going up your leg. Summary  Check your feet every day  for cuts, sores, red spots, swelling, and blisters.  Moisturize feet and legs daily.  Wear shoes that fit properly and have enough cushioning.  If you have foot problems, report any cuts, sores, or bruises to your health care provider immediately.  Schedule a complete foot exam at least once a year (annually) or more often if you have foot problems. This information is not intended to replace advice given to you by your health care provider. Make sure you discuss any questions you have with your health care provider. Document Released: 04/02/2000 Document Revised: 05/18/2017 Document Reviewed: 05/07/2016 Elsevier Patient Education  2020 [employer]  

## 2018-11-20 NOTE — Progress Notes (Signed)
Subjective:   [name] presents for continued care of ulceration submet head 2 left foot. Patient has been performing daily dressing changes of Mupirocin Ointment and band-aid right foot. She has been keeping her foot dry and wearing her Darco shoe daily.  Pt. denies any new complaints.  Patient denies any fever, chills, nightsweats, nausea or vomiting.  She states her son and daughter-in-law have both been monitoring her foot and both feel it has improved since her last visit.  Her pain symptoms have also resolved.  She is accompanied by her granddaughter on today's visit.   She states she has a few more antibiotic pills left.   Current Outpatient Medications:  .  amoxicillin-clavulanate (AUGMENTIN) 500-125 MG tablet, Take 1 tablet (500 mg total) by mouth 3 (three) times daily for 10 days., Disp: 30 tablet, Rfl: 0 .  aspirin EC 81 MG tablet, Take 1 tablet (81 mg total) by mouth daily., Disp: 90 tablet, Rfl: 3 .  Calcium-Magnesium-Vitamin D (CALCIUM MAGNESIUM PO), Take 1 tablet by mouth daily., Disp: , Rfl:  .  carvedilol (COREG) 6.25 MG tablet, Take 1 tablet (6.25 mg total) by mouth 2 (two) times daily with a meal., Disp: 10 tablet, Rfl: 0 .  Coenzyme Q10 (CO Q 10 PO), Take 1 capsule by mouth daily., Disp: , Rfl:  .  Continuous Blood Gluc Receiver (FREESTYLE LIBRE READER) DEVI, 1 Device by Does not apply route every 30 (thirty) days., Disp: 3 Device, Rfl: 0 .  Continuous Blood Gluc Sensor (FREESTYLE LIBRE SENSOR SYSTEM) MISC, Inject 3 Devices into the skin every 14 (fourteen) days. 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  ezetimibe (ZETIA) 10 MG tablet, Take 1 tablet (10 mg total) by mouth daily., Disp: 5 tablet, Rfl: 0 .  glucose blood (FREESTYLE PRECISION NEO TEST) test strip, Use Freestyle Precision Neo strips to check blood sugar 3 times daily. DX:E10.65, Disp: 100 each, Rfl: 3 .  Insulin Disposable Pump (OMNIPOD DASH 5 PACK) MISC, 1 each by Does not apply  route every 3 (three) days. Use as directed., Disp: 3 each, Rfl: 11 .  insulin lispro (HUMALOG) 100 UNIT/ML injection, Use a maximum of 40 units under the skin daily via omnipod insulin pump. DX:E10.65, Disp: 20 mL, Rfl: 3 .  isosorbide mononitrate (IMDUR) 60 MG 24 hr tablet, Take half a tablet in the morning (30 mg) and a whole tablet in the evening (60 mg), Disp: 135 tablet, Rfl: 1 .  lisinopril (PRINIVIL,ZESTRIL) 20 MG tablet, Take 1 tablet (20 mg total) by mouth daily., Disp: 90 tablet, Rfl: 3 .  Multiple Vitamin (MULTIVITAMIN) tablet, Take 1 tablet by mouth daily., Disp: , Rfl:  .  mupirocin ointment (BACTROBAN) 2 %, Apply to left foot ulcer once daily with dressing., Disp: 22 g, Rfl: 0 .  nitroGLYCERIN (NITROLINGUAL) 0.4 MG/SPRAY spray, Place 1 spray under the tongue every 5 (five) minutes x 3 doses as needed for chest pain (Call 911 if taking 3rd spray)., Disp: 4.9 g, Rfl: 1 .  rosuvastatin (CRESTOR) 40 MG tablet, Take 1 tablet (40 mg total) by mouth daily., Disp: 5 tablet, Rfl: 0   No Known Allergies   Objective:   Vascular Examination: Vitals:   11/20/18 0952  BP: (!) 110/51  Temp: (!) 97.2 F (36.2 C)    Capillary refill time immediate x 10 digits.  Dorsalis pedis and posterior tibial pulses palpable b/l.  Digital hair absent x 10 digits.  Skin temperature gradient WNL  b/l.  Dermatological Examination: Skin thin, shiny and atrophic b/l.  Toenails 1-5 b/l recently debrided.  Ulceration located submet head 2nd left foot: Predebridement measurements carried out today of 2.0 x 1.0 cm. No pain on palpation today. Hyperkeratotic roof with subdermal hemorrhage.  No periulcerative erythema, no edema, no drainage.  Flocculence is absent.  Malodor absent.  Postdebridement measurements today are: 2.0 x 1.0 cm and remains epithelialized.  No tracking, no tunneling, no undermining. No probing to bone, no purulent drainage.  No deep abscess evident.  Musculoskeletal: Muscle strength  5/5 to all LE muscle groups.  HAV with bunion deformity b/l.  Hammertoes b/l.  Neurological: Sensation diminished with 10 gram monofilament.  Vibratory sensation intact b/l.  Xrays:  Assessment:   1.  Diabetic Ulceration submet head 2 left foot, partial thickness 2.    NIDDM with peripheral neuropathy  Plan: 1. Ulcer was debrided of reactive hyperkeratosis. Ulcer was cleansed with wound cleanser and band-aid applied. 2. Continue oral antibiotics as prescribed until all are gone. 3. Xray was not indicated on today's visit as it has improved and she has no pain today. 4. Continue surgical shoe for the next 7 days, then she may start wearing her diabetic shoes. Liliane Channel has modified her left foot insert as well as several others she has brought in on today's visit. 5. She may get left foot wet.  6. Patient was instructed to apply Mupirocin Ointment and band-aid to left foot once daily.  7. Patient is to follow up 3 weeks for her preventative diabetic foot care visit. 8. Patient instructed to report to emergency department with worsening appearance of ulcer/toe/foot, increased pain, foul odor, increased redness, swelling, drainage, fever, chills, nightsweats, nausea, vomiting, increased blood sugar.  9. Patient/POA related understanding.

## 2018-11-27 ENCOUNTER — Encounter: Payer: Medicare Other | Admitting: Endocrinology

## 2018-11-27 ENCOUNTER — Other Ambulatory Visit: Payer: Self-pay

## 2018-11-27 DIAGNOSIS — Z0289 Encounter for other administrative examinations: Secondary | ICD-10-CM

## 2018-11-27 NOTE — Progress Notes (Signed)
This encounter was created in error - please disregard.

## 2018-12-01 ENCOUNTER — Other Ambulatory Visit: Payer: Self-pay

## 2018-12-01 ENCOUNTER — Encounter: Payer: Self-pay | Admitting: Endocrinology

## 2018-12-01 ENCOUNTER — Ambulatory Visit (INDEPENDENT_AMBULATORY_CARE_PROVIDER_SITE_OTHER): Payer: Medicare Other | Admitting: Endocrinology

## 2018-12-01 DIAGNOSIS — E1065 Type 1 diabetes mellitus with hyperglycemia: Secondary | ICD-10-CM | POA: Diagnosis not present

## 2018-12-01 DIAGNOSIS — I2510 Atherosclerotic heart disease of native coronary artery without angina pectoris: Secondary | ICD-10-CM

## 2018-12-01 NOTE — Progress Notes (Signed)
Patient ID: Natasha Hernandez, female   DOB: 11/02/1940, 78 y.o.   MRN: [id number]            Reason for Appointment : Follow-up for Type 1 Diabetes  Today's office visit was provided via telemedicine using video technique The patient was explained the limitations of evaluation and management by telemedicine and the availability of in person appointments.  The patient understood the limitations and agreed to proceed. Patient also understood that the telehealth visit is billable. . Location of the patient: Patient's home . Location of the provider: Physician office Only the patient and myself were participating in the encounter    History of Present Illness          Diagnosis: Type 1 diabetes mellitus, date of diagnosis: 1953          Previous history:  She thinks she has been on various insulin regimens over the years but has been on Lantus for 10-12 years along with mealtime insulin Previously followed by an endocrinologist but no records available. Her A1c has usually been around 7% reportedly and was 7.2 in 7/16   Recent history:    INSULIN PUMP settings BASAL rate midnight = 0.5, 5 AM = 0.65.  12 PM = 0.70.  6 PM = 0.6 and 9 PM =  0. 55 with total basal 14  Carbohydrate coverage 1: 6, sensitivity 1: 60 and active insulin 4 hours. Blood sugar target 135-140  A1c is 7.9 last  Current management, blood sugar patterns and problems identified:     She was seen for hyperglycemia in 10/2018 and not clear what was making her blood sugars go higher  With increasing her basal rates throughout the day her blood sugars are now significantly better  However she now says that her blood sugars are having a tendency to getting low frequently  This is not apparent on her CGM download since this was done about 5 days ago  Also her blood sugar readings in the pump are entered only with the fingerstick right before the meal and no low sugars are documented  HYPOGLYCEMIA is reported  early morning between 6-7 AM as well as some tendency to low sugars before lunch and dinner  CGM indicates occasional tendency to low or low normal blood sugar after her lunch bolus around 2 PM also.  No low sugars overnight, she does try to check sugars during the night periodically  She does not appear to be having rare missed boluses at lunch or dinner  She has been eating small meals with daily average carbohydrate intake under 50 g  POSTPRANDIAL blood sugars: These appear to be well controlled only occasional rise in blood sugar after breakfast or lunch.  However generally is having a tendency to low readings after lunch bolus, currently eating between 20-35 g at lunch of carbohydrate  Does have some hypoglycemia late at night after 10 PM possibly related to having a snack and not bolusing for it since she is afraid of nighttime low sugars  Glucose monitoring:  is being done several times a day         Glucometer: Freestyle libre  Patient thinks that her freestyle Josephine Igolibre is within 10 mg of fingerstick blood sugars  Blood Glucose analysis and averages from freestyle libre  CGM use % of time  100  2-week average/SD  146, was 197  Time in range    82    %  % Time Above 180  16  %  Time above 250  1  % Time Below 70  1     PRE-MEAL Fasting Lunch Dinner Bedtime Overall  Glucose range:  139-219      Averages:  138  138  129  150  146   POST-MEAL PC Breakfast PC Lunch PC Dinner  Glucose range:     Averages:  153  136  151     Factors causing Hypoglycemia: Overestimating mealtime coverage, excessive basal rate  Symptoms of hypoglycemia: Some weakness, confusion at times Treatment of hypoglycemia: Corn syrup or jellybeans/other sweets.  She will get 15 g of carbohydrate  Self-care: The diet that the patient has been following is: Carbohydrate counting  Mealtimes are: Breakfast AT 8.30 AM Dinner: 7.30   For breakfast will have toast with cheese or egg, sometimes cereal and egg   Exercise: walking and that time will use her exercise bike        Dietician consultation: Most recent: Years ago .         CDE consultation: 04/2018   Wt Readings from Last 3 Encounters:  10/27/18 154 lb 12.8 oz (70.2 kg)  08/24/18 161 lb 12.8 oz (73.4 kg)  08/18/18 162 lb 6.4 oz (73.7 kg)    Lab Results  Component Value Date   HGBA1C 7.9 (H) 10/23/2018   HGBA1C 7.7 (A) 06/19/2018   HGBA1C 7.6 (H) 02/20/2018   Lab Results  Component Value Date   MICROALBUR 3.7 (H) 08/18/2018   LDLCALC 37 10/23/2018   CREATININE 1.07 10/23/2018    Lab Results  Component Value Date   MICRALBCREAT 4.5 08/18/2018   MICRALBCREAT 1.1 06/06/2017     Allergies as of 12/01/2018   No Known Allergies     Medication List       Accurate as of December 01, 2018 10:55 AM. If you have any questions, ask your nurse or doctor.        aspirin EC 81 MG tablet Take 1 tablet (81 mg total) by mouth daily.   CALCIUM MAGNESIUM PO Take 1 tablet by mouth daily.   carvedilol 6.25 MG tablet Commonly known as: COREG Take 1 tablet (6.25 mg total) by mouth 2 (two) times daily with a meal.   CO Q 10 PO Take 1 capsule by mouth daily.   ezetimibe 10 MG tablet Commonly known as: ZETIA Take 1 tablet (10 mg total) by mouth daily.   FreeStyle Libre Reader Devi 1 Device by Does not apply route every 30 (thirty) days.   FreeStyle Libre Sensor System Misc Inject 3 Devices into the skin every 14 (fourteen) days. Dx code- E10.65 NDC- 16109604540   glucose blood test strip Commonly known as: FreeStyle Precision Neo Test Use Freestyle Precision Neo strips to check blood sugar 3 times daily. DX:E10.65   insulin lispro 100 UNIT/ML injection Commonly known as: HUMALOG Use a maximum of 40 units under the skin daily via omnipod insulin pump. DX:E10.65   isosorbide mononitrate 60 MG 24 hr tablet Commonly known as: IMDUR Take half a tablet in the morning (30 mg) and a whole tablet in the evening (60 mg)    lisinopril 20 MG tablet Commonly known as: ZESTRIL Take 1 tablet (20 mg total) by mouth daily.   multivitamin tablet Take 1 tablet by mouth daily.   mupirocin ointment 2 % Commonly known as: BACTROBAN Apply to left foot ulcer once daily with dressing.   nitroGLYCERIN 0.4 MG/SPRAY spray Commonly known as: Nitrolingual Place 1 spray under the tongue every 5 (  five) minutes x 3 doses as needed for chest pain (Call 911 if taking 3rd spray).   OmniPod Dash 5 Pack Pods Misc 1 each by Does not apply route every 3 (three) days. Use as directed.   rosuvastatin 40 MG tablet Commonly known as: CRESTOR Take 1 tablet (40 mg total) by mouth daily.       Allergies: No Known Allergies  Past Medical History:  Diagnosis Date  . Carotid artery occlusion   . Coronary artery disease    CABG in 1996. Most recent cardiac catheterization in 2013 showed patent grafts including LIMA to LAD, SVG to OM, SVG to diagonal and SVG to PDA.  . Diabetes mellitus without complication (HCC)   . Hyperlipidemia   . Hypertension   . PAD (peripheral artery disease) (HCC)     Past Surgical History:  Procedure Laterality Date  . CORONARY ARTERY BYPASS GRAFT  1996    Family History  Problem Relation Age of Onset  . Breast cancer Mother [id number]  . Stroke Mother   . Emphysema Father [id number]  . Heart attack Father     Social History:  reports that she has never smoked. She has never used smokeless tobacco. She reports that she does not drink alcohol. No history on file for drug.      Review of Systems                                                            Lipids:  She is on Crestor 40 mg daily and since 7/19 also on Zetia  Has history of CAD  several years ago with coronary bypass LDL is controlled adequately   Lab Results  Component Value Date   CHOL 113 10/23/2018   HDL 63.20 10/23/2018   LDLCALC 37 10/23/2018   TRIG 66.0 10/23/2018   CHOLHDL 2 10/23/2018    Has had proliferative retinopathy,  has been going regularly for exams  Neuropathy with some sensory loss: Forms filled out for diabetic shoes previously  HYPERTENSION:  This is treated with lisinopril 20 mg daily along with low-dose Coreg On her last visit in the office her blood pressure was low normal and she was dizzy Also creatinine was relatively higher She was told to reduce her lisinopril in half but her PCP told her not to do so Blood pressure in the podiatrist office recently is low normal  Recently not checking blood pressure at home, does not have a proper cuff  BP Readings from Last 3 Encounters:  11/20/18 (!) 110/51  10/27/18 (!) 80/40  08/24/18 122/64    Lab Results  Component Value Date   CREATININE 1.07 10/23/2018   CREATININE 0.91 08/18/2018   CREATININE 1.02 (H) 04/16/2018     LABS:  No visits with results within 1 Week(s) from this visit.  Latest known visit with results is:  Lab on 10/23/2018  Component Date Value Ref Range Status  . TSH 10/23/2018 3.70  0.35 - 4.50 uIU/mL Final  . Cholesterol 10/23/2018 113  0 - 200 mg/dL Final   ATP III Classification       Desirable:  < 200 mg/dL               Borderline High:  200 - 239 mg/dL  High:  > = 240 mg/dL  . Triglycerides 10/23/2018 66.0  0.0 - 149.0 mg/dL Final   Normal:  <161<150 mg/dLBorderline High:  150 - 199 mg/dL  . HDL 10/23/2018 63.20  >39.00 mg/dL Final  . VLDL 09/60/454007/09/2018 13.2  0.0 - 40.0 mg/dL Final  . LDL Cholesterol 10/23/2018 37  0 - 99 mg/dL Final  . Total CHOL/HDL Ratio 10/23/2018 2   Final                  Men          Women1/2 Average Risk     3.4          3.3Average Risk          5.0          4.42X Average Risk          9.6          7.13X Average Risk          15.0          11.0                      . NonHDL 10/23/2018 50.09   Final   NOTE:  Non-HDL goal should be 30 mg/dL higher than patient's LDL goal (i.e. LDL goal of < 70 mg/dL, would have non-HDL goal of < 100 mg/dL)  . Sodium 10/23/2018 135  135 - 145 mEq/L  Final  . Potassium 10/23/2018 4.3  3.5 - 5.1 mEq/L Final  . Chloride 10/23/2018 97  96 - 112 mEq/L Final  . CO2 10/23/2018 30  19 - 32 mEq/L Final  . Glucose, Bld 10/23/2018 208* 70 - 99 mg/dL Final  . BUN 98/11/914707/09/2018 25* 6 - 23 mg/dL Final  . Creatinine, Ser 10/23/2018 1.07  0.40 - 1.20 mg/dL Final  . Calcium 82/95/621307/09/2018 9.4  8.4 - 10.5 mg/dL Final  . GFR 08/65/784607/09/2018 49.78* >60.00 mL/min Final  . Hgb A1c MFr Bld 10/23/2018 7.9* 4.6 - 6.5 % Final   Glycemic Control Guidelines for People with Diabetes:Non Diabetic:  <6%Goal of Therapy: <7%Additional Action Suggested:  >8%      Physical Examination:  There were no vitals taken for this visit.     ASSESSMENT:  Diabetes type 1, long-standing  A1c is 7.9 on the last visit  See history of present illness for analysis of current diabetes management, blood sugar patterns and problems identified  She is on an Omni pod insulin pump with Humalog insulin  Her day-to-day management was discussed in detail including review of her CGM Also since recent CGM results are not available online her recent blood sugar patterns were discussed Discussed factors causing low and high sugars She also tends to be afraid of low sugars and usually will overtreat low normal readings causing her blood sugars at mealtimes recently to be mildly high throughout the day  She is reporting blood sugars that are tending to be low normal or low after waking up, sometimes before lunch and after lunch, occasionally may feel sugar going down before dinner also Her basal rates may be excessive in the morning hours as well as carbohydrate coverage appears to be slightly excessive at her lunch meal   Hypertension: Her blood pressure is continuing to be low normal which is not recommended considering her age Has not followed up in person with PCP  LIPIDS: Well controlled    PLAN:   Basal rates will be reduced accordingly Carbohydrate coverage  1: 7 at lunch  New  basal rates will be 5 AM = 0.55, no other changes needed No need for late night bedtime snack unless her blood sugar is below about 140 Regular exercise  She will cut her lisinopril in half consistently  To monitor blood pressure at home  Counseling time on subjects discussed in assessment and plan sections is over 50% of today's 25 minute visit   There are no Patient Instructions on file for this visit.     Reather Littler 12/01/2018, 10:55 AM   Note: This note was prepared with Dragon voice recognition system technology. Any transcriptional errors that result from this process are unintentional.

## 2018-12-08 ENCOUNTER — Other Ambulatory Visit: Payer: Self-pay

## 2018-12-08 ENCOUNTER — Telehealth: Payer: Self-pay

## 2018-12-08 ENCOUNTER — Ambulatory Visit: Payer: Medicare Other | Admitting: Podiatry

## 2018-12-08 MED ORDER — LISINOPRIL 10 MG PO TABS: 10.0000 mg | ORAL_TABLET | Freq: Every day | ORAL | 1 refills | Status: DC

## 2018-12-08 NOTE — Telephone Encounter (Signed)
Rx sent 

## 2018-12-08 NOTE — Telephone Encounter (Signed)
Received fax from [facility] regarding pt. Pt is requesting that a new Rx be sent for Lisinopril 20, taking 1/2 tab by mouth daily. Last office visit note indicates that she was instructed by Dr. Dwyane Dee to reduce to half tab, but PCP instructed pt not to.  How would you like to proceed with this? Send new Rx for half tabs, or have pt speak with PCP?

## 2018-12-08 NOTE — Telephone Encounter (Signed)
Change to 10 mg daily

## 2018-12-11 ENCOUNTER — Ambulatory Visit (INDEPENDENT_AMBULATORY_CARE_PROVIDER_SITE_OTHER): Payer: Medicare Other | Admitting: Podiatry

## 2018-12-11 ENCOUNTER — Encounter: Payer: Self-pay | Admitting: Podiatry

## 2018-12-11 ENCOUNTER — Other Ambulatory Visit: Payer: Self-pay

## 2018-12-11 VITALS — Temp 97.2°F

## 2018-12-11 DIAGNOSIS — B351 Tinea unguium: Secondary | ICD-10-CM

## 2018-12-11 DIAGNOSIS — M79675 Pain in left toe(s): Secondary | ICD-10-CM

## 2018-12-11 DIAGNOSIS — M79674 Pain in right toe(s): Secondary | ICD-10-CM

## 2018-12-11 DIAGNOSIS — L84 Corns and callosities: Secondary | ICD-10-CM | POA: Diagnosis not present

## 2018-12-11 DIAGNOSIS — E1142 Type 2 diabetes mellitus with diabetic polyneuropathy: Secondary | ICD-10-CM

## 2018-12-11 NOTE — Patient Instructions (Signed)
Diabetes Mellitus and Foot Care Foot care is an important part of your health, especially when you have diabetes. Diabetes may cause you to have problems because of poor blood flow (circulation) to your feet and legs, which can cause your skin to:  Become thinner and drier.  Break more easily.  Heal more slowly.  Peel and crack. You may also have nerve damage (neuropathy) in your legs and feet, causing decreased feeling in them. This means that you may not notice minor injuries to your feet that could lead to more serious problems. Noticing and addressing any potential problems early is the best way to prevent future foot problems. How to care for your feet Foot hygiene  Wash your feet daily with warm water and mild soap. Do not use hot water. Then, pat your feet and the areas between your toes until they are completely dry. Do not soak your feet as this can dry your skin.  Trim your toenails straight across. Do not dig under them or around the cuticle. File the edges of your nails with an emery board or nail file.  Apply a moisturizing lotion or petroleum jelly to the skin on your feet and to dry, brittle toenails. Use lotion that does not contain alcohol and is unscented. Do not apply lotion between your toes. Shoes and socks  Wear clean socks or stockings every day. Make sure they are not too tight. Do not wear knee-high stockings since they may decrease blood flow to your legs.  Wear shoes that fit properly and have enough cushioning. Always look in your shoes before you put them on to be sure there are no objects inside.  To break in new shoes, wear them for just a few hours a day. This prevents injuries on your feet. Wounds, scrapes, corns, and calluses  Check your feet daily for blisters, cuts, bruises, sores, and redness. If you cannot see the bottom of your feet, use a mirror or ask someone for help.  Do not cut corns or calluses or try to remove them with medicine.  If you  find a minor scrape, cut, or break in the skin on your feet, keep it and the skin around it clean and dry. You may clean these areas with mild soap and water. Do not clean the area with peroxide, alcohol, or iodine.  If you have a wound, scrape, corn, or callus on your foot, look at it several times a day to make sure it is healing and not infected. Check for: ? Redness, swelling, or pain. ? Fluid or blood. ? Warmth. ? Pus or a bad smell. General instructions  Do not cross your legs. This may decrease blood flow to your feet.  Do not use heating pads or hot water bottles on your feet. They may burn your skin. If you have lost feeling in your feet or legs, you may not know this is happening until it is too late.  Protect your feet from hot and cold by wearing shoes, such as at the beach or on hot pavement.  Schedule a complete foot exam at least once a year (annually) or more often if you have foot problems. If you have foot problems, report any cuts, sores, or bruises to your health care provider immediately. Contact a health care provider if:  You have a medical condition that increases your risk of infection and you have any cuts, sores, or bruises on your feet.  You have an injury that is not   healing.  You have redness on your legs or feet.  You feel burning or tingling in your legs or feet.  You have pain or cramps in your legs and feet.  Your legs or feet are numb.  Your feet always feel cold.  You have pain around a toenail. Get help right away if:  You have a wound, scrape, corn, or callus on your foot and: ? You have pain, swelling, or redness that gets worse. ? You have fluid or blood coming from the wound, scrape, corn, or callus. ? Your wound, scrape, corn, or callus feels warm to the touch. ? You have pus or a bad smell coming from the wound, scrape, corn, or callus. ? You have a fever. ? You have a red line going up your leg. Summary  Check your feet every day  for cuts, sores, red spots, swelling, and blisters.  Moisturize feet and legs daily.  Wear shoes that fit properly and have enough cushioning.  If you have foot problems, report any cuts, sores, or bruises to your health care provider immediately.  Schedule a complete foot exam at least once a year (annually) or more often if you have foot problems. This information is not intended to replace advice given to you by your health care provider. Make sure you discuss any questions you have with your health care provider. Document Released: 04/02/2000 Document Revised: 05/18/2017 Document Reviewed: 05/07/2016 Elsevier Patient Education  2020 [employer]  

## 2018-12-20 NOTE — Progress Notes (Signed)
Subjective:   [name] presents for follow up care of ulceration of left foot.  Patient has been performing daily dressing changes to left foot daily utilizing Mupirocin Ointment. Pt. denies any new complaints.  Patient denies any fever, chills, nightsweats, nausea or vomiting.  She states her foot feels much better now and is ambulating pain free.   Current Outpatient Medications:  .  aspirin EC 81 MG tablet, Take 1 tablet (81 mg total) by mouth daily., Disp: 90 tablet, Rfl: 3 .  Calcium-Magnesium-Vitamin D (CALCIUM MAGNESIUM PO), Take 1 tablet by mouth daily., Disp: , Rfl:  .  carvedilol (COREG) 6.25 MG tablet, Take 1 tablet (6.25 mg total) by mouth 2 (two) times daily with a meal., Disp: 10 tablet, Rfl: 0 .  Coenzyme Q10 (CO Q 10 PO), Take 1 capsule by mouth daily., Disp: , Rfl:  .  Continuous Blood Gluc Receiver (FREESTYLE LIBRE READER) DEVI, 1 Device by Does not apply route every 30 (thirty) days., Disp: 3 Device, Rfl: 0 .  Continuous Blood Gluc Sensor (FREESTYLE LIBRE SENSOR SYSTEM) MISC, Inject 3 Devices into the skin every 14 (fourteen) days. Dx code- E10.65 NDC- 61443154008, Disp: 3 each, Rfl: 2 .  ezetimibe (ZETIA) 10 MG tablet, Take 1 tablet (10 mg total) by mouth daily., Disp: 5 tablet, Rfl: 0 .  glucose blood (FREESTYLE PRECISION NEO TEST) test strip, Use Freestyle Precision Neo strips to check blood sugar 3 times daily. DX:E10.65, Disp: 100 each, Rfl: 3 .  Insulin Disposable Pump (OMNIPOD DASH 5 PACK) MISC, 1 each by Does not apply route every 3 (three) days. Use as directed., Disp: 3 each, Rfl: 11 .  insulin lispro (HUMALOG) 100 UNIT/ML injection, Use a maximum of 40 units under the skin daily via omnipod insulin pump. DX:E10.65, Disp: 20 mL, Rfl: 3 .  isosorbide mononitrate (IMDUR) 60 MG 24 hr tablet, Take half a tablet in the morning (30 mg) and a whole tablet in the evening (60 mg), Disp: 135 tablet, Rfl: 1 .  lisinopril (ZESTRIL) 10 MG tablet, Take 1 tablet (10 mg  total) by mouth daily. Take 1 tablet by mouth once daily., Disp: 90 tablet, Rfl: 1 .  Multiple Vitamin (MULTIVITAMIN) tablet, Take 1 tablet by mouth daily., Disp: , Rfl:  .  nitroGLYCERIN (NITROLINGUAL) 0.4 MG/SPRAY spray, Place 1 spray under the tongue every 5 (five) minutes x 3 doses as needed for chest pain (Call 911 if taking 3rd spray)., Disp: 4.9 g, Rfl: 1 .  rosuvastatin (CRESTOR) 40 MG tablet, Take 1 tablet (40 mg total) by mouth daily., Disp: 5 tablet, Rfl: 0   No Known Allergies   Objective:   Vascular Examination: Vitals:   12/11/18 1451  Temp: (!) 97.2 F (36.2 C)    Capillary refill time immediate x 10 digits.  Dorsalis pedis pulses palpable b/l.  Posterior tibial pulses palpable b/l.  Digital hair absent x 10 digits.  Skin temperature gradient WNL b/l.  Dermatological Examination: Skin thin, shiny and atrophic b/l.  Toenails 1-5 b/l discolored, thick, dystrophic with subungual debris and pain with palpation to nailbeds due to thickness of nails.  Ulceration located submet head 2 left foot: Predebridement measurements carried out today of 2.0 x 1.0 cm.  No periulcerative erythema, no edema, no drainage.  Flocculence is negative.  Malodor negative.  Postdebridement measurements today are: 2.0 x 1.0 and noted to be epithelialized.    Musculoskeletal: Muscle strength 5/5 to all LE muscle groups.  Neurological: Sensation diminished  with 10 gram monofilament.  Assessment:   1. Preulcerative callus plantar left foot 2. Painful onychomycosis 3. NIDDM with peripheral neuropathy  Plan: 1. Toenails 1-5 b/l debrided in length and girth without complication. 2. Preulcerative callus was debrided of reactive hyperkeratosis left foot. 3. Patient was given instructions on offloading and was instructed to call immediately if any signs or symptoms of infection arise.  4. Patient is to follow up one month. 5. Patient instructed to report to emergency department with  worsening appearance of ulcer/toe/foot, increased pain, foul odor, increased redness, swelling, drainage, fever, chills, nightsweats, nausea, vomiting, increased blood sugar.  6. Patient/POA related understanding.

## 2018-12-24 ENCOUNTER — Other Ambulatory Visit: Payer: Self-pay | Admitting: Endocrinology

## 2018-12-24 DIAGNOSIS — E7800 Pure hypercholesterolemia, unspecified: Secondary | ICD-10-CM

## 2019-01-05 ENCOUNTER — Other Ambulatory Visit: Payer: Self-pay

## 2019-01-05 DIAGNOSIS — E1065 Type 1 diabetes mellitus with hyperglycemia: Secondary | ICD-10-CM

## 2019-01-05 MED ORDER — GLUCOSE BLOOD VI STRP: ORAL_STRIP | 12 refills | Status: DC

## 2019-01-12 ENCOUNTER — Ambulatory Visit: Payer: Medicare Other | Admitting: Podiatry

## 2019-01-19 DIAGNOSIS — Z961 Presence of intraocular lens: Secondary | ICD-10-CM | POA: Diagnosis not present

## 2019-01-19 DIAGNOSIS — H35373 Puckering of macula, bilateral: Secondary | ICD-10-CM | POA: Diagnosis not present

## 2019-01-19 DIAGNOSIS — H18423 Band keratopathy, bilateral: Secondary | ICD-10-CM | POA: Diagnosis not present

## 2019-01-19 DIAGNOSIS — Z794 Long term (current) use of insulin: Secondary | ICD-10-CM | POA: Diagnosis not present

## 2019-01-19 DIAGNOSIS — E113553 Type 2 diabetes mellitus with stable proliferative diabetic retinopathy, bilateral: Secondary | ICD-10-CM | POA: Diagnosis not present

## 2019-01-20 ENCOUNTER — Other Ambulatory Visit: Payer: Self-pay | Admitting: Endocrinology

## 2019-02-01 ENCOUNTER — Telehealth: Payer: Self-pay

## 2019-02-01 NOTE — Telephone Encounter (Signed)
Requesting refill on medication isosorbide mononitrate (IMDUR) 60 MG 24 hr tablet. Needing a 90 day supply.   Please advise

## 2019-02-01 NOTE — Telephone Encounter (Signed)
Called pt's daughter and informed her that this medication is prescribed by Dr. Mora Appl and must be refilled by his office.

## 2019-02-08 ENCOUNTER — Telehealth: Payer: Self-pay | Admitting: Cardiovascular Disease

## 2019-02-08 ENCOUNTER — Other Ambulatory Visit: Payer: Self-pay | Admitting: *Deleted

## 2019-02-08 MED ORDER — ISOSORBIDE MONONITRATE ER 60 MG PO TB24: ORAL_TABLET | ORAL | 1 refills | Status: DC

## 2019-02-08 NOTE — Telephone Encounter (Signed)
°*  STAT* If patient is at the pharmacy, call can be transferred to refill team.   1. Which medications need to be refilled? (please list name of each medication and dose if known) isosorbide mononitrate (IMDUR) 60 MG - 0.5 tablet in morning and whole tablet in evening   2. Which pharmacy/location (including street and city if local pharmacy) is medication to be sent to? [facility]  3. Do they need a 30 day or 90 day supply? 30 day

## 2019-02-08 NOTE — Telephone Encounter (Signed)
Requested Prescriptions   Signed Prescriptions Disp Refills  . isosorbide mononitrate (IMDUR) 60 MG 24 hr tablet 45 tablet 1    Sig: Take half a tablet in the morning (30 mg) and a whole tablet in the evening (60 mg)    Authorizing Provider: Kathlyn Sacramento A    Ordering User: Britt Bottom

## 2019-02-08 NOTE — Telephone Encounter (Signed)
Requested Prescriptions   Signed Prescriptions Disp Refills  . isosorbide mononitrate (IMDUR) 60 MG 24 hr tablet 45 tablet 1    Sig: Take half a tablet in the morning (30 mg) and a whole tablet in the evening (60 mg)    Authorizing Provider: ARIDA, MUHAMMAD A    Ordering User: LOPEZ, MARINA C    

## 2019-02-12 ENCOUNTER — Ambulatory Visit: Payer: Medicare Other | Admitting: Podiatry

## 2019-02-12 DIAGNOSIS — E113552 Type 2 diabetes mellitus with stable proliferative diabetic retinopathy, left eye: Secondary | ICD-10-CM | POA: Diagnosis not present

## 2019-02-12 DIAGNOSIS — H35372 Puckering of macula, left eye: Secondary | ICD-10-CM | POA: Diagnosis not present

## 2019-02-12 DIAGNOSIS — H35371 Puckering of macula, right eye: Secondary | ICD-10-CM | POA: Diagnosis not present

## 2019-02-12 DIAGNOSIS — E113551 Type 2 diabetes mellitus with stable proliferative diabetic retinopathy, right eye: Secondary | ICD-10-CM | POA: Diagnosis not present

## 2019-02-12 LAB — HM DIABETES EYE EXAM

## 2019-02-13 ENCOUNTER — Ambulatory Visit (INDEPENDENT_AMBULATORY_CARE_PROVIDER_SITE_OTHER): Payer: Medicare Other | Admitting: Podiatry

## 2019-02-13 ENCOUNTER — Other Ambulatory Visit: Payer: Self-pay

## 2019-02-13 ENCOUNTER — Encounter: Payer: Self-pay | Admitting: Podiatry

## 2019-02-13 DIAGNOSIS — M79674 Pain in right toe(s): Secondary | ICD-10-CM

## 2019-02-13 DIAGNOSIS — E1142 Type 2 diabetes mellitus with diabetic polyneuropathy: Secondary | ICD-10-CM

## 2019-02-13 DIAGNOSIS — L84 Corns and callosities: Secondary | ICD-10-CM | POA: Diagnosis not present

## 2019-02-13 DIAGNOSIS — M79675 Pain in left toe(s): Secondary | ICD-10-CM

## 2019-02-13 DIAGNOSIS — B351 Tinea unguium: Secondary | ICD-10-CM

## 2019-02-13 NOTE — Patient Instructions (Signed)
Diabetes Mellitus and Foot Care Foot care is an important part of your health, especially when you have diabetes. Diabetes may cause you to have problems because of poor blood flow (circulation) to your feet and legs, which can cause your skin to:  Become thinner and drier.  Break more easily.  Heal more slowly.  Peel and crack. You may also have nerve damage (neuropathy) in your legs and feet, causing decreased feeling in them. This means that you may not notice minor injuries to your feet that could lead to more serious problems. Noticing and addressing any potential problems early is the best way to prevent future foot problems. How to care for your feet Foot hygiene  Wash your feet daily with warm water and mild soap. Do not use hot water. Then, pat your feet and the areas between your toes until they are completely dry. Do not soak your feet as this can dry your skin.  Trim your toenails straight across. Do not dig under them or around the cuticle. File the edges of your nails with an emery board or nail file.  Apply a moisturizing lotion or petroleum jelly to the skin on your feet and to dry, brittle toenails. Use lotion that does not contain alcohol and is unscented. Do not apply lotion between your toes. Shoes and socks  Wear clean socks or stockings every day. Make sure they are not too tight. Do not wear knee-high stockings since they may decrease blood flow to your legs.  Wear shoes that fit properly and have enough cushioning. Always look in your shoes before you put them on to be sure there are no objects inside.  To break in new shoes, wear them for just a few hours a day. This prevents injuries on your feet. Wounds, scrapes, corns, and calluses  Check your feet daily for blisters, cuts, bruises, sores, and redness. If you cannot see the bottom of your feet, use a mirror or ask someone for help.  Do not cut corns or calluses or try to remove them with medicine.  If you  find a minor scrape, cut, or break in the skin on your feet, keep it and the skin around it clean and dry. You may clean these areas with mild soap and water. Do not clean the area with peroxide, alcohol, or iodine.  If you have a wound, scrape, corn, or callus on your foot, look at it several times a day to make sure it is healing and not infected. Check for: ? Redness, swelling, or pain. ? Fluid or blood. ? Warmth. ? Pus or a bad smell. General instructions  Do not cross your legs. This may decrease blood flow to your feet.  Do not use heating pads or hot water bottles on your feet. They may burn your skin. If you have lost feeling in your feet or legs, you may not know this is happening until it is too late.  Protect your feet from hot and cold by wearing shoes, such as at the beach or on hot pavement.  Schedule a complete foot exam at least once a year (annually) or more often if you have foot problems. If you have foot problems, report any cuts, sores, or bruises to your health care provider immediately. Contact a health care provider if:  You have a medical condition that increases your risk of infection and you have any cuts, sores, or bruises on your feet.  You have an injury that is not   healing.  You have redness on your legs or feet.  You feel burning or tingling in your legs or feet.  You have pain or cramps in your legs and feet.  Your legs or feet are numb.  Your feet always feel cold.  You have pain around a toenail. Get help right away if:  You have a wound, scrape, corn, or callus on your foot and: ? You have pain, swelling, or redness that gets worse. ? You have fluid or blood coming from the wound, scrape, corn, or callus. ? Your wound, scrape, corn, or callus feels warm to the touch. ? You have pus or a bad smell coming from the wound, scrape, corn, or callus. ? You have a fever. ? You have a red line going up your leg. Summary  Check your feet every day  for cuts, sores, red spots, swelling, and blisters.  Moisturize feet and legs daily.  Wear shoes that fit properly and have enough cushioning.  If you have foot problems, report any cuts, sores, or bruises to your health care provider immediately.  Schedule a complete foot exam at least once a year (annually) or more often if you have foot problems. This information is not intended to replace advice given to you by your health care provider. Make sure you discuss any questions you have with your health care provider. Document Released: 04/02/2000 Document Revised: 05/18/2017 Document Reviewed: 05/07/2016 Elsevier Patient Education  2020 [employer]  

## 2019-02-15 NOTE — Progress Notes (Signed)
Subjective: Natasha Hernandez presents today with history of diabetes and neuropathy. She has h/o ulceration of the left foot which has healed.  Patient is also seen for follow up of chronic, painful mycotic toenails b/l feet. Pain is aggravated when wearing enclosed shoe gear. Pain is getting progressively worse and relieved with periodic professional debridement.   Today, she relates concern of lesions on her right foot. She feels she is getting more pressure on that foot now. Symptomatic is submet head 5 of the right foot.  Danelle Berry, PA-C is her PCP.   Current Outpatient Medications on File Prior to Visit  Medication Sig Dispense Refill  . aspirin EC 81 MG tablet Take 1 tablet (81 mg total) by mouth daily. 90 tablet 3  . calcium carbonate (OS-CAL) 600 MG TABS tablet Take by mouth.    . Calcium-Magnesium-Vitamin D (CALCIUM MAGNESIUM PO) Take 1 tablet by mouth daily.    . carvedilol (COREG) 6.25 MG tablet Take 1 tablet by mouth twice daily with meals. 180 tablet 0  . Coenzyme Q10 (CO Q 10 PO) Take 1 capsule by mouth daily.    . Continuous Blood Gluc Receiver (FREESTYLE LIBRE READER) DEVI 1 Device by Does not apply route every 30 (thirty) days. 3 Device 0  . Continuous Blood Gluc Sensor (FREESTYLE LIBRE SENSOR SYSTEM) MISC Inject 3 Devices into the skin every 14 (fourteen) days. Dx code- E10.65 NDC- 29798921194 3 each 2  . ezetimibe (ZETIA) 10 MG tablet Take 1 tablet by mouth daily. 90 tablet 1  . glucose blood (FREESTYLE PRECISION NEO TEST) test strip Use Freestyle Precision Neo strips to check blood sugar 3 times daily. DX:E10.65 100 each 3  . glucose blood test strip Use to monitor glucose levels 4 times daily 100 each 12  . Insulin Disposable Pump (OMNIPOD DASH 5 PACK) MISC 1 each by Does not apply route every 3 (three) days. Use as directed. 3 each 11  . insulin lispro (HUMALOG) 100 UNIT/ML injection Use a maximum of 40 units under the skin daily via omnipod insulin pump. DX:E10.65  20 mL 3  . isosorbide mononitrate (IMDUR) 60 MG 24 hr tablet Take half a tablet in the morning (30 mg) and a whole tablet in the evening (60 mg) 45 tablet 1  . lisinopril (ZESTRIL) 10 MG tablet Take 1 tablet (10 mg total) by mouth daily. Take 1 tablet by mouth once daily. 90 tablet 1  . Multiple Vitamin (MULTIVITAMIN) tablet Take 1 tablet by mouth daily.    . nitroGLYCERIN (NITROLINGUAL) 0.4 MG/SPRAY spray Place 1 spray under the tongue every 5 (five) minutes x 3 doses as needed for chest pain (Call 911 if taking 3rd spray). 4.9 g 1  . rosuvastatin (CRESTOR) 40 MG tablet Take 1 tablet by mouth daily. 90 tablet 1   No current facility-administered medications on file prior to visit.     No Known Allergies  Objective:  Vascular Examination: Capillary refill time immediate b/l.  Dorsalis pedis pulses present b/l.  Posterior tibial pulses present b/l.  No digital hair x 10 digits.  Skin temperature WNL b/l.  Dermatological Examination: Skin thin, shiny and atrophic b/l.  Toenails 1-5 b/l discolored, thick, dystrophic with subungual debris and pain with palpation to nailbeds due to thickness of nails.  Hyperkeratotic lesions submet head 1, 5 right foot, submet head 3 left foot. No erythema, no edema, no drainage, no flocculence noted.   Musculoskeletal: Muscle strength 5/5 to all LE muscle groups.  Hammertoes 1-5  b/l.  Neurological: Sensation diminished with 10 gram monofilament.  Assessment: 1. Painful onychomycosis toenails 1-5 b/l 2. Calluses submet head 1, 5 right, submet head 3 left 3. NIDDM with neuropathy   Plan: 1. Toenails 1-5 b/l were debrided in length and girth without iatrogenic bleeding. 2. Calluses pared submet head 1, 5 right, submet head 3 left utilizing sterile scalpel blade without incident. 3. Pedorthist offloaded diabetic insert for lesions on right foot, submet heads 1, 5 right foot. 4. Patient to continue soft, supportive shoe gear  daily. 5. Patient to report any pedal injuries to medical professional immediately. 6. Follow up  9 weeks. 7. Patient/POA to call should there be a concern in the interim.

## 2019-03-05 DIAGNOSIS — Z961 Presence of intraocular lens: Secondary | ICD-10-CM | POA: Diagnosis not present

## 2019-03-05 DIAGNOSIS — H18423 Band keratopathy, bilateral: Secondary | ICD-10-CM | POA: Diagnosis not present

## 2019-03-05 DIAGNOSIS — E113593 Type 2 diabetes mellitus with proliferative diabetic retinopathy without macular edema, bilateral: Secondary | ICD-10-CM | POA: Diagnosis not present

## 2019-03-05 DIAGNOSIS — H35373 Puckering of macula, bilateral: Secondary | ICD-10-CM | POA: Diagnosis not present

## 2019-03-05 DIAGNOSIS — Z794 Long term (current) use of insulin: Secondary | ICD-10-CM | POA: Diagnosis not present

## 2019-03-19 ENCOUNTER — Other Ambulatory Visit: Payer: Medicare Other

## 2019-03-22 ENCOUNTER — Ambulatory Visit: Payer: Medicare Other | Admitting: Endocrinology

## 2019-03-23 ENCOUNTER — Other Ambulatory Visit: Payer: Self-pay | Admitting: Cardiovascular Disease

## 2019-03-26 ENCOUNTER — Encounter (HOSPITAL_COMMUNITY): Payer: Self-pay

## 2019-03-26 ENCOUNTER — Inpatient Hospital Stay (HOSPITAL_COMMUNITY)
Admission: EM | Admit: 2019-03-26 | Discharge: 2019-03-30 | DRG: 177 | Disposition: A | Discharge status: Home / Self Care | Payer: Medicare Other | Attending: Student | Admitting: Student

## 2019-03-26 ENCOUNTER — Emergency Department (HOSPITAL_COMMUNITY): Payer: Medicare Other

## 2019-03-26 DIAGNOSIS — Z803 Family history of malignant neoplasm of breast: Secondary | ICD-10-CM

## 2019-03-26 DIAGNOSIS — Z20828 Contact with and (suspected) exposure to other viral communicable diseases: Secondary | ICD-10-CM | POA: Diagnosis not present

## 2019-03-26 DIAGNOSIS — M5136 Other intervertebral disc degeneration, lumbar region: Secondary | ICD-10-CM | POA: Diagnosis present

## 2019-03-26 DIAGNOSIS — E871 Hypo-osmolality and hyponatremia: Secondary | ICD-10-CM | POA: Diagnosis not present

## 2019-03-26 DIAGNOSIS — R110 Nausea: Secondary | ICD-10-CM

## 2019-03-26 DIAGNOSIS — J1289 Other viral pneumonia: Secondary | ICD-10-CM | POA: Diagnosis not present

## 2019-03-26 DIAGNOSIS — Z951 Presence of aortocoronary bypass graft: Secondary | ICD-10-CM | POA: Diagnosis not present

## 2019-03-26 DIAGNOSIS — I2510 Atherosclerotic heart disease of native coronary artery without angina pectoris: Secondary | ICD-10-CM | POA: Diagnosis not present

## 2019-03-26 DIAGNOSIS — K5900 Constipation, unspecified: Secondary | ICD-10-CM | POA: Diagnosis not present

## 2019-03-26 DIAGNOSIS — E1042 Type 1 diabetes mellitus with diabetic polyneuropathy: Secondary | ICD-10-CM | POA: Diagnosis present

## 2019-03-26 DIAGNOSIS — R9431 Abnormal electrocardiogram [ECG] [EKG]: Secondary | ICD-10-CM | POA: Diagnosis not present

## 2019-03-26 DIAGNOSIS — U071 COVID-19: Secondary | ICD-10-CM | POA: Diagnosis not present

## 2019-03-26 DIAGNOSIS — Z8249 Family history of ischemic heart disease and other diseases of the circulatory system: Secondary | ICD-10-CM

## 2019-03-26 DIAGNOSIS — E109 Type 1 diabetes mellitus without complications: Secondary | ICD-10-CM

## 2019-03-26 DIAGNOSIS — Z823 Family history of stroke: Secondary | ICD-10-CM

## 2019-03-26 DIAGNOSIS — E7800 Pure hypercholesterolemia, unspecified: Secondary | ICD-10-CM | POA: Diagnosis present

## 2019-03-26 DIAGNOSIS — J1282 Pneumonia due to coronavirus disease 2019: Secondary | ICD-10-CM | POA: Diagnosis present

## 2019-03-26 DIAGNOSIS — E785 Hyperlipidemia, unspecified: Secondary | ICD-10-CM | POA: Diagnosis present

## 2019-03-26 DIAGNOSIS — R112 Nausea with vomiting, unspecified: Secondary | ICD-10-CM | POA: Diagnosis present

## 2019-03-26 DIAGNOSIS — Z825 Family history of asthma and other chronic lower respiratory diseases: Secondary | ICD-10-CM

## 2019-03-26 DIAGNOSIS — Z9641 Presence of insulin pump (external) (internal): Secondary | ICD-10-CM | POA: Diagnosis present

## 2019-03-26 DIAGNOSIS — I10 Essential (primary) hypertension: Secondary | ICD-10-CM | POA: Diagnosis present

## 2019-03-26 DIAGNOSIS — Z794 Long term (current) use of insulin: Secondary | ICD-10-CM

## 2019-03-26 DIAGNOSIS — Z7982 Long term (current) use of aspirin: Secondary | ICD-10-CM

## 2019-03-26 DIAGNOSIS — E1051 Type 1 diabetes mellitus with diabetic peripheral angiopathy without gangrene: Secondary | ICD-10-CM | POA: Diagnosis present

## 2019-03-26 DIAGNOSIS — E1010 Type 1 diabetes mellitus with ketoacidosis without coma: Secondary | ICD-10-CM | POA: Diagnosis not present

## 2019-03-26 DIAGNOSIS — E869 Volume depletion, unspecified: Secondary | ICD-10-CM | POA: Diagnosis present

## 2019-03-26 LAB — CBC WITH DIFFERENTIAL/PLATELET
Abs Immature Granulocytes: 0.02 10*3/uL (ref 0.00–0.07)
Basophils Absolute: 0 10*3/uL (ref 0.0–0.1)
Basophils Relative: 0 %
Eosinophils Absolute: 0 10*3/uL (ref 0.0–0.5)
Eosinophils Relative: 0 %
HCT: 40.6 % (ref 36.0–46.0)
Hemoglobin: 13.4 g/dL (ref 12.0–15.0)
Immature Granulocytes: 0 %
Lymphocytes Relative: 9 %
Lymphs Abs: 0.5 10*3/uL — ABNORMAL LOW (ref 0.7–4.0)
MCH: 33.3 pg (ref 26.0–34.0)
MCHC: 33 g/dL (ref 30.0–36.0)
MCV: 100.7 fL — ABNORMAL HIGH (ref 80.0–100.0)
Monocytes Absolute: 0.2 10*3/uL (ref 0.1–1.0)
Monocytes Relative: 3 %
Neutro Abs: 4.4 10*3/uL (ref 1.7–7.7)
Neutrophils Relative %: 88 %
Platelets: 179 10*3/uL (ref 150–400)
RBC: 4.03 MIL/uL (ref 3.87–5.11)
RDW: 13.4 % (ref 11.5–15.5)
WBC: 5.1 10*3/uL (ref 4.0–10.5)
nRBC: 0 % (ref 0.0–0.2)

## 2019-03-26 LAB — COMPREHENSIVE METABOLIC PANEL
ALT: 27 U/L (ref 0–44)
AST: 30 U/L (ref 15–41)
Albumin: 3.5 g/dL (ref 3.5–5.0)
Alkaline Phosphatase: 106 U/L (ref 38–126)
Anion gap: 11 (ref 5–15)
BUN: 17 mg/dL (ref 8–23)
CO2: 29 mmol/L (ref 22–32)
Calcium: 9 mg/dL (ref 8.9–10.3)
Chloride: 90 mmol/L — ABNORMAL LOW (ref 98–111)
Creatinine, Ser: 0.63 mg/dL (ref 0.44–1.00)
GFR calc Af Amer: >60 mL/min (ref >60)
GFR calc non Af Amer: >60 mL/min (ref >60)
Glucose, Bld: 129 mg/dL — ABNORMAL HIGH (ref 70–99)
Potassium: 4 mmol/L (ref 3.5–5.1)
Sodium: 130 mmol/L — ABNORMAL LOW (ref 135–145)
Total Bilirubin: 0.7 mg/dL (ref 0.3–1.2)
Total Protein: 6.5 g/dL (ref 6.5–8.1)

## 2019-03-26 LAB — LACTATE DEHYDROGENASE: LDH: 113 U/L (ref 98–192)

## 2019-03-26 LAB — POC SARS CORONAVIRUS 2 AG -  ED: SARS Coronavirus 2 Ag: NEGATIVE

## 2019-03-26 LAB — FERRITIN: Ferritin: 301 ng/mL (ref 11–307)

## 2019-03-26 LAB — LIPASE, BLOOD: Lipase: 22 U/L (ref 11–51)

## 2019-03-26 LAB — FIBRINOGEN: Fibrinogen: 459 mg/dL (ref 210–475)

## 2019-03-26 LAB — TROPONIN I (HIGH SENSITIVITY): Troponin I (High Sensitivity): 9 ng/L (ref <18)

## 2019-03-26 LAB — LACTIC ACID, PLASMA: Lactic Acid, Venous: 1 mmol/L (ref 0.5–1.9)

## 2019-03-26 LAB — C-REACTIVE PROTEIN: CRP: 7.1 mg/dL — ABNORMAL HIGH (ref <1.0)

## 2019-03-26 MED ORDER — SODIUM CHLORIDE 0.9 % IV BOLUS
500.0000 mL | Freq: Once | INTRAVENOUS | Status: AC
  Administered 2019-03-26: 500 mL via INTRAVENOUS

## 2019-03-26 MED ORDER — ONDANSETRON HCL 4 MG/2ML IJ SOLN
4.0000 mg | Freq: Once | INTRAMUSCULAR | Status: AC
  Administered 2019-03-26: 4 mg via INTRAVENOUS
  Filled 2019-03-26: qty 2

## 2019-03-26 NOTE — ED Provider Notes (Addendum)
[facility] DEPT Provider Note   CSN: [id number] Arrival date & time: 03/26/19  2053     History   Chief Complaint Chief Complaint  Patient presents with  . Nausea    HPI Natasha Hernandez is a 78 y.o. female.     78 year old female with prior medical history as detailed below presents for evaluation of nausea, vomiting, weakness, cough, fatigue.  Patient reports that she been feeling poorly for the last 4 days.  She reports that her son who has been caring for her at home tested positive for Covid today.  Patient denies shortness of breath.  She denies fever.  Patient denies chest pain.  The history is provided by the patient and medical records.  Illness Location:  Nausea, vomiting, weakness, suspected Covid infection Severity:  Mild Onset quality:  Gradual Duration:  4 days Timing:  Constant Progression:  Worsening Chronicity:  New Associated symptoms: nausea and vomiting   Associated symptoms: no shortness of breath     Past Medical History:  Diagnosis Date  . Carotid artery occlusion   . Coronary artery disease    CABG in 1996. Most recent cardiac catheterization in 2013 showed patent grafts including LIMA to LAD, SVG to OM, SVG to diagonal and SVG to PDA.  . Diabetes mellitus without complication ([place])   . Hyperlipidemia   . Hypertension   . PAD (peripheral artery disease) [facility])     Patient Active Problem List   Diagnosis Date Noted  . Onychomycosis of multiple toenails with type 2 diabetes mellitus and peripheral neuropathy ([place]) 09/29/2018  . Pre-ulcerative calluses 09/29/2018  . Type 1 diabetes mellitus ([place]) 06/02/2018  . Degeneration of lumbar intervertebral disc 05/31/2018  . Scoliosis deformity of spine 05/31/2018  . Chest pain, rule out acute myocardial infarction 04/16/2018  . AKI (acute kidney injury) ([place]) 04/16/2018  . CAD (coronary artery disease) 09/27/2016  . Hx of CABG 09/27/2016  . Right carotid artery  occlusion 09/27/2016  . PAD (peripheral artery disease) ([place]) 09/27/2016  . Essential hypertension 07/10/2016  . Uncontrolled type 1 diabetes mellitus with hyperglycemia ([place]) 05/07/2016  . Pure hypercholesterolemia 05/07/2016  . Diabetic peripheral neuropathy associated with type 1 diabetes mellitus ([place]) 05/07/2016  . Pain in finger of right hand 04/13/2016  . Stiffness of finger joint of right hand 04/13/2016  . Stiffness of right wrist joint 04/13/2016  . Closed fracture of right distal radius 04/08/2016    Past Surgical History:  Procedure Laterality Date  . CORONARY ARTERY BYPASS GRAFT  1996     OB History   No obstetric history on file.      Home Medications    Prior to Admission medications   Medication Sig Start Date End Date Taking? Authorizing Provider  aspirin EC 81 MG tablet Take 1 tablet (81 mg total) by mouth daily. 03/25/17  Yes Wellington Hampshire, MD  Calcium-Magnesium-Vitamin D (CALCIUM MAGNESIUM PO) Take 1 tablet by mouth daily.   Yes [provider]  carvedilol (COREG) 6.25 MG tablet Take 1 tablet by mouth twice daily with meals. 01/22/19  Yes Elayne Snare, MD  Coenzyme Q10 (CO Q 10 PO) Take 1 capsule by mouth daily.   Yes [provider]  ezetimibe (ZETIA) 10 MG tablet Take 1 tablet by mouth daily. 12/24/18  Yes Elayne Snare, MD  insulin lispro (HUMALOG) 100 UNIT/ML injection Use a maximum of 40 units under the skin daily via omnipod insulin pump. DX:E10.65 06/28/18  Yes Elayne Snare, MD  isosorbide mononitrate (IMDUR) 60 MG 24 hr tablet Take 1/2 tablets by mouth every morning, take 1 tablet by mouth every evening. Patient taking differently: Take 60 mg by mouth daily.  03/23/19  Yes Iran Ouch, MD  lisinopril (ZESTRIL) 10 MG tablet Take 1 tablet (10 mg total) by mouth daily. Take 1 tablet by mouth once daily. Patient taking differently: Take 10 mg by mouth daily.  12/08/18  Yes Reather Littler, MD  Multiple Vitamin (MULTIVITAMIN) tablet Take 1  tablet by mouth daily.   Yes [provider]  nitroGLYCERIN (NITROLINGUAL) 0.4 MG/SPRAY spray Place 1 spray under the tongue every 5 (five) minutes x 3 doses as needed for chest pain (Call 911 if taking 3rd spray). 01/27/16  Yes Iran Ouch, MD  rosuvastatin (CRESTOR) 40 MG tablet Take 1 tablet by mouth daily. 12/24/18  Yes Reather Littler, MD  Continuous Blood Gluc Receiver (FREESTYLE LIBRE READER) DEVI 1 Device by Does not apply route every 30 (thirty) days. 03/30/17   Reather Littler, MD  Continuous Blood Gluc Sensor (FREESTYLE LIBRE SENSOR SYSTEM) MISC Inject 3 Devices into the skin every 14 (fourteen) days. Dx code- E10.65 [place]- [id number] 05/11/16   Reather Littler, MD  glucose blood (FREESTYLE PRECISION NEO TEST) test strip Use Freestyle Precision Neo strips to check blood sugar 3 times daily. DX:E10.65 08/16/18   Reather Littler, MD  glucose blood test strip Use to monitor glucose levels 4 times daily 01/05/19   Romero Belling, MD  Insulin Disposable Pump (OMNIPOD DASH 5 PACK) MISC 1 each by Does not apply route every 3 (three) days. Use as directed. 04/06/18   Reather Littler, MD    Family History Family History  Problem Relation Age of Onset  . Breast cancer Mother 18  . Stroke Mother   . Emphysema Father 20  . Heart attack Father     Social History Social History   Tobacco Use  . Smoking status: Never Smoker  . Smokeless tobacco: Never Used  Substance Use Topics  . Alcohol use: No  . Drug use: Not on file     Allergies   Patient has no known allergies.   Review of Systems Review of Systems  Respiratory: Negative for shortness of breath.   Gastrointestinal: Positive for nausea and vomiting.  All other systems reviewed and are negative.    Physical Exam Updated Vital Signs BP (!) 153/76 (BP Location: Right Arm)   Pulse 72   Temp 98.7 F (37.1 C) (Oral)   Resp 18   Ht  (1.626 m)   Wt 68 kg   SpO2 94%   BMI 25.75 kg/m   Physical Exam Vitals signs and nursing  note reviewed.  Constitutional:      General: She is not in acute distress.    Appearance: Normal appearance. She is well-developed.  HENT:     Head: Normocephalic and atraumatic.  Eyes:     Conjunctiva/sclera: Conjunctivae normal.     Pupils: Pupils are equal, round, and reactive to light.  Neck:     Musculoskeletal: Normal range of motion and neck supple.  Cardiovascular:     Rate and Rhythm: Normal rate and regular rhythm.     Heart sounds: Normal heart sounds.  Pulmonary:     Effort: Pulmonary effort is normal. No respiratory distress.     Breath sounds: Normal breath sounds.  Abdominal:     General: There is no distension.     Palpations: Abdomen is soft.  Tenderness: There is no abdominal tenderness.  Musculoskeletal: Normal range of motion.        General: No deformity.  Skin:    General: Skin is warm and dry.  Neurological:     General: No focal deficit present.     Mental Status: She is alert and oriented to person, place, and time. Mental status is at baseline.      ED Treatments / Results  Labs (all labs ordered are listed, but only abnormal results are displayed) Labs Reviewed  COMPREHENSIVE METABOLIC PANEL - Abnormal; Notable for the following components:      Result Value   Sodium 130 (*)    Chloride 90 (*)    Glucose, Bld 129 (*)    All other components within normal limits  CBC WITH DIFFERENTIAL/PLATELET - Abnormal; Notable for the following components:   MCV 100.7 (*)    Lymphs Abs 0.5 (*)    All other components within normal limits  RESPIRATORY PANEL BY RT PCR (FLU A&B, COVID)  LACTIC ACID, PLASMA  LIPASE, BLOOD  LACTIC ACID, PLASMA  URINALYSIS, ROUTINE W REFLEX MICROSCOPIC  LACTATE DEHYDROGENASE  C-REACTIVE PROTEIN  FERRITIN  FIBRINOGEN  POC SARS CORONAVIRUS 2 AG -  ED  TROPONIN I (HIGH SENSITIVITY)    EKG None  Radiology Dg Chest Port 1 View  Result Date: 03/26/2019 CLINICAL DATA:  Weakness EXAM: PORTABLE CHEST 1 VIEW  COMPARISON:  04/15/2018 FINDINGS: Cardiac shadow is mildly enlarged but stable. Postsurgical changes are again seen. Aortic calcifications are noted. The lungs are well aerated bilaterally. Patchy airspace opacities are noted in the mid and lower right lung field. No bony abnormality is seen. IMPRESSION: Patchy airspace opacities within the right mid and lower lung. Correlation with recently obtained COVID-19 testing is recommended. Electronically Signed   By: Alcide Clever M.D.   On: 03/26/2019 21:55    Procedures Procedures (including critical care time)  Medications Ordered in ED Medications  ondansetron (ZOFRAN) injection 4 mg (has no administration in time range)  sodium chloride 0.9 % bolus 500 mL (has no administration in time range)     Initial Impression / Assessment and Plan / ED Course  I have reviewed the triage vital signs and the nursing notes.  Pertinent labs & imaging results that were available during my care of the patient were reviewed by me and considered in my medical decision making (see chart for details).        MDM  Screen complete  Natasha Hernandez was evaluated in Emergency Department on 03/26/2019 for the symptoms described in the history of present illness. She was evaluated in the context of the global COVID-19 pandemic, which necessitated consideration that the patient might be at risk for infection with the SARS-CoV-2 virus that causes COVID-19. Institutional protocols and algorithms that pertain to the evaluation of patients at risk for COVID-19 are in a state of rapid change based on information released by regulatory bodies including the CDC and federal and state organizations. These policies and algorithms were followed during the patient's care in the ED.  Patient is presenting for evaluation of nausea, vomiting, fatigue, and suspected COVID.  Patient's initial POC AG Covid test is negative.   However, patient's constellation of symptoms and close  contact with COVID positive family member are very concerning.   Patient likely would benefit from admission for further workup and treatment.   Hospitalist service (Dr. Dartha Lodge) is aware of case and will evaluate for admission.    Final  Clinical Impressions(s) / ED Diagnoses   Final diagnoses:  Nausea  Nausea and vomiting, intractability of vomiting not specified, unspecified vomiting type    ED Discharge Orders    None       Wynetta FinesMessick, Mara Favero C, MD 03/26/19 2307    Wynetta FinesMessick, Gaither Biehn C, MD 03/27/19 [phone]

## 2019-03-26 NOTE — ED Triage Notes (Signed)
Per EMS, Pt has had a covid exposure. Symptoms began apprx 4 days ago. Pt has had N/V, low grade fever, fatigue, cough. Denies SOB, chest pain. Pt does not require any oxygen. PT called for N/V with no relief.

## 2019-03-26 NOTE — ED Triage Notes (Signed)
PT was given 4 mg of Zofran in route.

## 2019-03-26 NOTE — ED Notes (Addendum)
Natasha Hernandez, pts POA and point of contact would like to be contacted with updates regarding pt. [phone])

## 2019-03-26 NOTE — ED Notes (Signed)
Pt placed on Purewick 

## 2019-03-27 ENCOUNTER — Other Ambulatory Visit: Payer: Self-pay

## 2019-03-27 ENCOUNTER — Ambulatory Visit: Payer: Medicare Other | Admitting: Family Medicine

## 2019-03-27 DIAGNOSIS — E871 Hypo-osmolality and hyponatremia: Secondary | ICD-10-CM | POA: Diagnosis present

## 2019-03-27 DIAGNOSIS — E869 Volume depletion, unspecified: Secondary | ICD-10-CM | POA: Diagnosis present

## 2019-03-27 DIAGNOSIS — Z20828 Contact with and (suspected) exposure to other viral communicable diseases: Secondary | ICD-10-CM | POA: Diagnosis present

## 2019-03-27 DIAGNOSIS — I25118 Atherosclerotic heart disease of native coronary artery with other forms of angina pectoris: Secondary | ICD-10-CM | POA: Diagnosis not present

## 2019-03-27 DIAGNOSIS — E1065 Type 1 diabetes mellitus with hyperglycemia: Secondary | ICD-10-CM | POA: Diagnosis not present

## 2019-03-27 DIAGNOSIS — Z825 Family history of asthma and other chronic lower respiratory diseases: Secondary | ICD-10-CM | POA: Diagnosis not present

## 2019-03-27 DIAGNOSIS — M5136 Other intervertebral disc degeneration, lumbar region: Secondary | ICD-10-CM | POA: Diagnosis present

## 2019-03-27 DIAGNOSIS — J1282 Pneumonia due to coronavirus disease 2019: Secondary | ICD-10-CM | POA: Diagnosis present

## 2019-03-27 DIAGNOSIS — E7800 Pure hypercholesterolemia, unspecified: Secondary | ICD-10-CM | POA: Diagnosis present

## 2019-03-27 DIAGNOSIS — R110 Nausea: Secondary | ICD-10-CM

## 2019-03-27 DIAGNOSIS — Z7982 Long term (current) use of aspirin: Secondary | ICD-10-CM | POA: Diagnosis not present

## 2019-03-27 DIAGNOSIS — J1289 Other viral pneumonia: Secondary | ICD-10-CM | POA: Diagnosis present

## 2019-03-27 DIAGNOSIS — K5900 Constipation, unspecified: Secondary | ICD-10-CM | POA: Diagnosis not present

## 2019-03-27 DIAGNOSIS — Z951 Presence of aortocoronary bypass graft: Secondary | ICD-10-CM | POA: Diagnosis not present

## 2019-03-27 DIAGNOSIS — R112 Nausea with vomiting, unspecified: Secondary | ICD-10-CM | POA: Diagnosis present

## 2019-03-27 DIAGNOSIS — U071 COVID-19: Secondary | ICD-10-CM

## 2019-03-27 DIAGNOSIS — Z823 Family history of stroke: Secondary | ICD-10-CM | POA: Diagnosis not present

## 2019-03-27 DIAGNOSIS — Z803 Family history of malignant neoplasm of breast: Secondary | ICD-10-CM | POA: Diagnosis not present

## 2019-03-27 DIAGNOSIS — I10 Essential (primary) hypertension: Secondary | ICD-10-CM | POA: Diagnosis present

## 2019-03-27 DIAGNOSIS — E1042 Type 1 diabetes mellitus with diabetic polyneuropathy: Secondary | ICD-10-CM | POA: Diagnosis present

## 2019-03-27 DIAGNOSIS — Z794 Long term (current) use of insulin: Secondary | ICD-10-CM | POA: Diagnosis not present

## 2019-03-27 DIAGNOSIS — Z9641 Presence of insulin pump (external) (internal): Secondary | ICD-10-CM | POA: Diagnosis present

## 2019-03-27 DIAGNOSIS — E785 Hyperlipidemia, unspecified: Secondary | ICD-10-CM | POA: Diagnosis present

## 2019-03-27 DIAGNOSIS — E1051 Type 1 diabetes mellitus with diabetic peripheral angiopathy without gangrene: Secondary | ICD-10-CM | POA: Diagnosis present

## 2019-03-27 DIAGNOSIS — Z8249 Family history of ischemic heart disease and other diseases of the circulatory system: Secondary | ICD-10-CM | POA: Diagnosis not present

## 2019-03-27 DIAGNOSIS — I2510 Atherosclerotic heart disease of native coronary artery without angina pectoris: Secondary | ICD-10-CM | POA: Diagnosis present

## 2019-03-27 HISTORY — DX: COVID-19: U07.1

## 2019-03-27 LAB — CBC
HCT: 36.2 % (ref 36.0–46.0)
Hemoglobin: 11.9 g/dL — ABNORMAL LOW (ref 12.0–15.0)
MCH: 33.5 pg (ref 26.0–34.0)
MCHC: 32.9 g/dL (ref 30.0–36.0)
MCV: 102 fL — ABNORMAL HIGH (ref 80.0–100.0)
Platelets: 149 10*3/uL — ABNORMAL LOW (ref 150–400)
RBC: 3.55 MIL/uL — ABNORMAL LOW (ref 3.87–5.11)
RDW: 13.4 % (ref 11.5–15.5)
WBC: 3.8 10*3/uL — ABNORMAL LOW (ref 4.0–10.5)
nRBC: 0 % (ref 0.0–0.2)

## 2019-03-27 LAB — HEMOGLOBIN A1C
Hgb A1c MFr Bld: 7.8 % — ABNORMAL HIGH (ref 4.8–5.6)
Mean Plasma Glucose: 177.16 mg/dL

## 2019-03-27 LAB — CBG MONITORING, ED
Glucose-Capillary: 132 mg/dL — ABNORMAL HIGH (ref 70–99)
Glucose-Capillary: 193 mg/dL — ABNORMAL HIGH (ref 70–99)

## 2019-03-27 LAB — RESPIRATORY PANEL BY RT PCR (FLU A&B, COVID)
Influenza A by PCR: NEGATIVE
Influenza B by PCR: NEGATIVE
SARS Coronavirus 2 by RT PCR: POSITIVE — AB

## 2019-03-27 LAB — GLUCOSE, CAPILLARY
Glucose-Capillary: 121 mg/dL — ABNORMAL HIGH (ref 70–99)
Glucose-Capillary: 131 mg/dL — ABNORMAL HIGH (ref 70–99)
Glucose-Capillary: 284 mg/dL — ABNORMAL HIGH (ref 70–99)

## 2019-03-27 LAB — CREATININE, SERUM
Creatinine, Ser: 0.54 mg/dL (ref 0.44–1.00)
GFR calc Af Amer: >60 mL/min (ref >60)
GFR calc non Af Amer: >60 mL/min (ref >60)

## 2019-03-27 LAB — LACTIC ACID, PLASMA: Lactic Acid, Venous: 1.2 mmol/L (ref 0.5–1.9)

## 2019-03-27 LAB — TROPONIN I (HIGH SENSITIVITY): Troponin I (High Sensitivity): 8 ng/L (ref <18)

## 2019-03-27 LAB — D-DIMER, QUANTITATIVE: D-Dimer, Quant: 1.21 ug/mL-FEU — ABNORMAL HIGH (ref 0.00–0.50)

## 2019-03-27 LAB — ABO/RH: ABO/RH(D): A POS

## 2019-03-27 MED ORDER — INSULIN ASPART 100 UNIT/ML ~~LOC~~ SOLN
0.0000 [IU] | Freq: Three times a day (TID) | SUBCUTANEOUS | Status: DC
  Administered 2019-03-27: 1 [IU] via SUBCUTANEOUS
  Administered 2019-03-27 – 2019-03-28 (×2): 5 [IU] via SUBCUTANEOUS
  Administered 2019-03-28: 3 [IU] via SUBCUTANEOUS
  Filled 2019-03-27: qty 0.09

## 2019-03-27 MED ORDER — FOLIC ACID 1 MG PO TABS
1.0000 mg | ORAL_TABLET | Freq: Every day | ORAL | Status: DC
  Administered 2019-03-27 – 2019-03-30 (×4): 1 mg via ORAL
  Filled 2019-03-27 (×5): qty 1

## 2019-03-27 MED ORDER — ROSUVASTATIN CALCIUM 20 MG PO TABS
40.0000 mg | ORAL_TABLET | Freq: Every day | ORAL | Status: DC
  Administered 2019-03-27 – 2019-03-30 (×4): 40 mg via ORAL
  Filled 2019-03-27 (×4): qty 2

## 2019-03-27 MED ORDER — SODIUM CHLORIDE 0.9 % IV SOLN
500.0000 mg | INTRAVENOUS | Status: DC
  Administered 2019-03-27: 500 mg via INTRAVENOUS
  Filled 2019-03-27: qty 500

## 2019-03-27 MED ORDER — ZINC SULFATE 220 (50 ZN) MG PO CAPS
220.0000 mg | ORAL_CAPSULE | Freq: Every day | ORAL | Status: DC
  Administered 2019-03-27 – 2019-03-30 (×4): 220 mg via ORAL
  Filled 2019-03-27 (×3): qty 1

## 2019-03-27 MED ORDER — INSULIN ASPART 100 UNIT/ML ~~LOC~~ SOLN
0.0000 [IU] | Freq: Three times a day (TID) | SUBCUTANEOUS | Status: DC
  Filled 2019-03-27: qty 0.06

## 2019-03-27 MED ORDER — EZETIMIBE 10 MG PO TABS
10.0000 mg | ORAL_TABLET | Freq: Every day | ORAL | Status: DC
  Administered 2019-03-27 – 2019-03-30 (×3): 10 mg via ORAL
  Filled 2019-03-27 (×3): qty 1

## 2019-03-27 MED ORDER — INSULIN GLARGINE 100 UNIT/ML ~~LOC~~ SOLN
12.0000 [IU] | Freq: Every day | SUBCUTANEOUS | Status: DC
  Administered 2019-03-27: 12 [IU] via SUBCUTANEOUS
  Filled 2019-03-27 (×3): qty 0.12

## 2019-03-27 MED ORDER — INSULIN PUMP
Freq: Three times a day (TID) | SUBCUTANEOUS | Status: DC
  Filled 2019-03-27: qty 1

## 2019-03-27 MED ORDER — SODIUM CHLORIDE 0.9 % IV SOLN
1.0000 g | INTRAVENOUS | Status: DC
  Administered 2019-03-27: 1 g via INTRAVENOUS
  Filled 2019-03-27: qty 10

## 2019-03-27 MED ORDER — CARVEDILOL 6.25 MG PO TABS
6.2500 mg | ORAL_TABLET | Freq: Two times a day (BID) | ORAL | Status: DC
  Administered 2019-03-27 – 2019-03-30 (×6): 6.25 mg via ORAL
  Filled 2019-03-27 (×7): qty 1

## 2019-03-27 MED ORDER — DEXAMETHASONE SODIUM PHOSPHATE 10 MG/ML IJ SOLN
6.0000 mg | INTRAMUSCULAR | Status: DC
  Administered 2019-03-27 – 2019-03-30 (×4): 6 mg via INTRAVENOUS
  Filled 2019-03-27 (×4): qty 1

## 2019-03-27 MED ORDER — INSULIN ASPART 100 UNIT/ML ~~LOC~~ SOLN
5.0000 [IU] | Freq: Three times a day (TID) | SUBCUTANEOUS | Status: DC
  Administered 2019-03-27 – 2019-03-28 (×4): 5 [IU] via SUBCUTANEOUS
  Filled 2019-03-27: qty 0.05

## 2019-03-27 MED ORDER — ASPIRIN EC 81 MG PO TBEC
81.0000 mg | DELAYED_RELEASE_TABLET | Freq: Every day | ORAL | Status: DC
  Administered 2019-03-27 – 2019-03-30 (×3): 81 mg via ORAL
  Filled 2019-03-27 (×3): qty 1

## 2019-03-27 MED ORDER — ADULT MULTIVITAMIN W/MINERALS CH
1.0000 | ORAL_TABLET | Freq: Every day | ORAL | Status: DC
  Administered 2019-03-27 – 2019-03-30 (×4): 1 via ORAL
  Filled 2019-03-27 (×4): qty 1

## 2019-03-27 MED ORDER — LISINOPRIL 10 MG PO TABS
10.0000 mg | ORAL_TABLET | Freq: Every day | ORAL | Status: DC
  Administered 2019-03-27 – 2019-03-30 (×3): 10 mg via ORAL
  Filled 2019-03-27 (×3): qty 1

## 2019-03-27 MED ORDER — SODIUM CHLORIDE 0.9 % IV SOLN
200.0000 mg | Freq: Once | INTRAVENOUS | Status: AC
  Administered 2019-03-27: 200 mg via INTRAVENOUS
  Filled 2019-03-27: qty 200

## 2019-03-27 MED ORDER — ISOSORBIDE MONONITRATE ER 60 MG PO TB24
60.0000 mg | ORAL_TABLET | Freq: Every day | ORAL | Status: DC
  Administered 2019-03-27 – 2019-03-30 (×3): 60 mg via ORAL
  Filled 2019-03-27 (×3): qty 1

## 2019-03-27 MED ORDER — VITAMIN B-1 100 MG PO TABS
100.0000 mg | ORAL_TABLET | Freq: Every day | ORAL | Status: DC
  Administered 2019-03-27 – 2019-03-30 (×4): 100 mg via ORAL
  Filled 2019-03-27 (×4): qty 1

## 2019-03-27 MED ORDER — INSULIN ASPART 100 UNIT/ML ~~LOC~~ SOLN
0.0000 [IU] | Freq: Every day | SUBCUTANEOUS | Status: DC
  Filled 2019-03-27: qty 0.05

## 2019-03-27 MED ORDER — ENOXAPARIN SODIUM 40 MG/0.4ML ~~LOC~~ SOLN
40.0000 mg | SUBCUTANEOUS | Status: DC
  Administered 2019-03-27 – 2019-03-30 (×4): 40 mg via SUBCUTANEOUS
  Filled 2019-03-27 (×4): qty 0.4

## 2019-03-27 MED ORDER — SODIUM CHLORIDE 0.9 % IV SOLN
100.0000 mg | Freq: Every day | INTRAVENOUS | Status: DC
  Administered 2019-03-28 – 2019-03-30 (×3): 100 mg via INTRAVENOUS
  Filled 2019-03-27 (×3): qty 100

## 2019-03-27 MED ORDER — VITAMIN C 500 MG PO TABS
500.0000 mg | ORAL_TABLET | Freq: Every day | ORAL | Status: DC
  Administered 2019-03-27 – 2019-03-30 (×3): 500 mg via ORAL
  Filled 2019-03-27 (×3): qty 1

## 2019-03-27 NOTE — H&P (Addendum)
History and Physical  Natasha Hernandez WJX:[id number] DOB: 06/15/1940 DOA: 03/26/2019  Referring physician: ER provider PCP: Salley Scarleturham, Kawanta F, MD  Outpatient Specialists:    Patient coming from: Home  Chief Complaint: Fever, nausea vomiting.  HPI:  Patient is a 78 year old female past medical history significant for peripheral artery disease, hypertension, hyperlipidemia, diabetes mellitus, coronary artery disease and carotid artery occlusion.  Patient presents with 4-day history of nausea, vomiting, fever and chills, mild nonproductive cough and weakness.  Apparently, the patient is exposed to Covid 19.  Patient's son, who happens to be the patient's carer, was diagnosed with COVID-19 today.  Patient feels very weak.  Point-of-care COVID-19 test came back negative.  COVID-19 PCR is being awaited.  No headache, no neck pain, no shortness of breath, no sore throat, no URI symptoms and no urinary symptoms.  Chest x-ray revealed right middle lobe and right lower lobe patchy airspace opacity, worrisome for viral pneumonia.  Hospitalist service has been asked to admit patient for further assessment and management.  ED Course: On presentation to the hospital, temperature is 98.7, blood pressure 132/70, heart rate of 74, respiratory rate of 16 and O2 sat of 97% on room air.  As documented above, point of care COVID-19 test came back negative.  Sodium is 130.  CRP 7.1, ferritin of 301, lactic acid of 1, D-dimer of 1.21 with fibrinogen of 459.  Pertinent labs: As documented above.  EKG: Independently reviewed.   Imaging: independently reviewed.   Review of Systems: Negative for visual changes, sore throat, rash, new muscle aches, chest pain, dysuria, bleeding, diarrhea.    Past Medical History:  Diagnosis Date   Carotid artery occlusion    Coronary artery disease    CABG in 1996. Most recent cardiac catheterization in 2013 showed patent grafts including LIMA to LAD, SVG to OM, SVG to diagonal  and SVG to PDA.   Diabetes mellitus without complication ([facility])    Hyperlipidemia    Hypertension    PAD (peripheral artery disease) ([facility])     Past Surgical History:  Procedure Laterality Date   CORONARY ARTERY BYPASS GRAFT  1996     reports that she has never smoked. She has never used smokeless tobacco. She reports that she does not drink alcohol. No history on file for drug.  No Known Allergies  Family History  Problem Relation Age of Onset   Breast cancer Mother [id number]   Stroke Mother    Emphysema Father [id number]   Heart attack Father      Prior to Admission medications   Medication Sig Start Date End Date Taking? Authorizing Provider  aspirin EC 81 MG tablet Take 1 tablet (81 mg total) by mouth daily. 03/25/17  Yes Iran OuchArida, Muhammad A, MD  Calcium-Magnesium-Vitamin D (CALCIUM MAGNESIUM PO) Take 1 tablet by mouth daily.   Yes [provider]  carvedilol (COREG) 6.25 MG tablet Take 1 tablet by mouth twice daily with meals. 01/22/19  Yes Reather LittlerKumar, Ajay, MD  Coenzyme Q10 (CO Q 10 PO) Take 1 capsule by mouth daily.   Yes [provider]  ezetimibe (ZETIA) 10 MG tablet Take 1 tablet by mouth daily. 12/24/18  Yes Reather LittlerKumar, Ajay, MD  insulin lispro (HUMALOG) 100 UNIT/ML injection Use a maximum of 40 units under the skin daily via omnipod insulin pump. DX:E10.65 06/28/18  Yes Reather LittlerKumar, Ajay, MD  isosorbide mononitrate (IMDUR) 60 MG 24 hr tablet Take 1/2 tablets by mouth every morning, take 1 tablet by mouth every evening. Patient  taking differently: Take 60 mg by mouth daily.  03/23/19  Yes Wellington Hampshire, MD  lisinopril (ZESTRIL) 10 MG tablet Take 1 tablet (10 mg total) by mouth daily. Take 1 tablet by mouth once daily. Patient taking differently: Take 10 mg by mouth daily.  12/08/18  Yes Elayne Snare, MD  Multiple Vitamin (MULTIVITAMIN) tablet Take 1 tablet by mouth daily.   Yes [provider]  nitroGLYCERIN (NITROLINGUAL) 0.4 MG/SPRAY spray Place 1 spray under the  tongue every 5 (five) minutes x 3 doses as needed for chest pain (Call 911 if taking 3rd spray). 01/27/16  Yes Wellington Hampshire, MD  rosuvastatin (CRESTOR) 40 MG tablet Take 1 tablet by mouth daily. 12/24/18  Yes Elayne Snare, MD  Continuous Blood Gluc Receiver (FREESTYLE LIBRE READER) DEVI 1 Device by Does not apply route every 30 (thirty) days. 03/30/17   Elayne Snare, MD  Continuous Blood Gluc Sensor (FREESTYLE LIBRE SENSOR SYSTEM) MISC Inject 3 Devices into the skin every 14 (fourteen) days. Dx code- E10.65 [place]- [id number] 05/11/16   Elayne Snare, MD  glucose blood (FREESTYLE PRECISION NEO TEST) test strip Use Freestyle Precision Neo strips to check blood sugar 3 times daily. DX:E10.65 08/16/18   Elayne Snare, MD  glucose blood test strip Use to monitor glucose levels 4 times daily 01/05/19   Renato Shin, MD  Insulin Disposable Pump (OMNIPOD DASH 5 PACK) MISC 1 each by Does not apply route every 3 (three) days. Use as directed. 04/06/18   Elayne Snare, MD    Physical Exam: Vitals:   03/26/19 2101 03/26/19 2129 03/26/19 2315  BP:  (!) 153/76 138/70  Pulse:  72 74  Resp:  18 16  Temp:  98.7 F (37.1 C)   TempSrc:  Oral   SpO2: 96% 94% 97%  Weight:  68 kg   Height:  5\' 4"  (1.626 m)     Constitutional:   Appears weak.   Eyes:   No pallor. No jaundice.  ENMT:   external ears, nose appear normal Neck:   Neck is supple. No JVD Respiratory:   Decreased air entry anteriorly.  Respiratory effort normal. No retractions or accessory muscle use Cardiovascular:   S1S2  No LE extremity edema   Abdomen:   Abdomen is soft and non tender. Organs are difficult to assess. Neurologic:   Awake and alert.  Moves all limbs.  Wt Readings from Last 3 Encounters:  03/26/19 68 kg  10/27/18 70.2 kg  08/24/18 73.4 kg    I have personally reviewed following labs and imaging studies  Labs on Admission:  CBC: Recent Labs  Lab 03/26/19 2135  WBC 5.1  NEUTROABS 4.4  HGB 13.4  HCT  40.6  MCV 100.7*  PLT 235   Basic Metabolic Panel: Recent Labs  Lab 03/26/19 2135  NA 130*  K 4.0  CL 90*  CO2 29  GLUCOSE 129*  BUN 17  CREATININE 0.63  CALCIUM 9.0   Liver Function Tests: Recent Labs  Lab 03/26/19 2135  AST 30  ALT 27  ALKPHOS 106  BILITOT 0.7  PROT 6.5  ALBUMIN 3.5   Recent Labs  Lab 03/26/19 2135  LIPASE 22   No results for input(s): AMMONIA in the last 168 hours. Coagulation Profile: No results for input(s): INR, PROTIME in the last 168 hours. Cardiac Enzymes: No results for input(s): CKTOTAL, CKMB, CKMBINDEX, TROPONINI in the last 168 hours. BNP (last 3 results) No results for input(s): PROBNP in the last 8760 hours. HbA1C:  No results for input(s): HGBA1C in the last 72 hours. CBG: No results for input(s): GLUCAP in the last 168 hours. Lipid Profile: No results for input(s): CHOL, HDL, LDLCALC, TRIG, CHOLHDL, LDLDIRECT in the last 72 hours. Thyroid Function Tests: No results for input(s): TSH, T4TOTAL, FREET4, T3FREE, THYROIDAB in the last 72 hours. Anemia Panel: Recent Labs    03/26/19 2255  FERRITIN 301   Urine analysis:    Component Value Date/Time   COLORURINE YELLOW 08/24/2018 0941   APPEARANCEUR CLOUDY (A) 08/24/2018 0941   LABSPEC 1.020 08/24/2018 0941   PHURINE 6.0 08/24/2018 0941   GLUCOSEU NEGATIVE 08/24/2018 0941   GLUCOSEU 100 (A) 05/04/2016 1535   HGBUR 2+ (A) 08/24/2018 0941   BILIRUBINUR NEGATIVE 05/04/2016 1535   KETONESUR NEGATIVE 08/24/2018 0941   PROTEINUR 2+ (A) 08/24/2018 0941   UROBILINOGEN 0.2 05/04/2016 1535   NITRITE NEGATIVE 08/24/2018 0941   LEUKOCYTESUR 3+ (A) 08/24/2018 0941   Sepsis Labs: @LABRCNTIP (procalcitonin:4,lacticidven:4) )No results found for this or any previous visit (from the past 240 hour(s)).    Radiological Exams on Admission: Dg Chest Port 1 View  Result Date: 03/26/2019 CLINICAL DATA:  Weakness EXAM: PORTABLE CHEST 1 VIEW COMPARISON:  04/15/2018 FINDINGS: Cardiac shadow  is mildly enlarged but stable. Postsurgical changes are again seen. Aortic calcifications are noted. The lungs are well aerated bilaterally. Patchy airspace opacities are noted in the mid and lower right lung field. No bony abnormality is seen. IMPRESSION: Patchy airspace opacities within the right mid and lower lung. Correlation with recently obtained COVID-19 testing is recommended. Electronically Signed   By: 04/17/2018 M.D.   On: 03/26/2019 21:55    EKG: Independently reviewed.   Active Problems:   Pneumonia due to COVID-19 virus   Assessment/Plan Possible pneumonia due to COVID-19 infection: -Admit patient  -Suspect point of care to COVID-19 test that came back negative is likely false negative. -Check inflammatory markers daily. -Supportive care. -Await COVID-19 PCR. -Have a low threshold to start patient on IV Decadron.  Hyponatremia: -This likely secondary to volume depletion. -Patient has had nausea and vomiting. -Check urine sodium, urine osmolality and serum osmolality.  Check TSH and cortisol level.  Volume depletion: -Cautious hydration considering possible COVID-19 infection.   Nausea and vomiting: -Suspect secondary to Covid infection -Supportive care.  Elevated MCV: Continue to work-up for possible etiology Cannot rule out marrow problem Continue to monitor closely  Diabetes mellitus: -Continue to optimize.  Hypertension: -Stable.  Continue to monitor closely.  Further management depend on hospital course.  DVT prophylaxis: Subacute Lovenox Code Status: Full code Family Communication:  Disposition Plan: This will depend on hospital course Consults called: None Admission status: Inpatient  Time spent: 65 minutes  14/10/2018, MD  [facility] Pager #: [phone] 7PM-7AM contact night coverage as above  03/27/2019, 12:36 AM

## 2019-03-27 NOTE — Progress Notes (Signed)
Patient seen and examined, admitted earlier this morning by Dr.Obgata -This is a 78 year old female with history of type 1 diabetes mellitus on insulin pump, PAD, hypertension, dyslipidemia, CAD, carotid artery disease presented to the ED with 4-day history of nausea vomiting fever chills productive cough and weakness. -Her son tested positive for Covid on 12/7 -In the emergency room she was afebrile with stable blood pressure heart rate, O2 sats were 97% on room air, COVID-19 PCR was positive, chest x-ray noted mild patchy right middle and lower lobe infiltrate  1.  SARS COVID-19 infection/pneumonia -Chest x-ray with mild right middle and lower lobe infiltrates -O2 sats at rest in the 92 to 95% range on room air -Continue daily Decadron, start IV remdesivir -Monitor inflammatory markers daily  2.  Type 1 diabetes mellitus -Resume insulin pump, urgent diabetes coordinator consult requested -If she develops hyperglycemia on steroids will need to increase her basal dose  Domenic Polite, MD

## 2019-03-27 NOTE — Progress Notes (Signed)
Inpatient Diabetes Program Recommendations  AACE/ADA: New Consensus Statement on Inpatient Glycemic Control (2015)  Target Ranges:  Prepandial:   less than 140 mg/dL      Peak postprandial:   less than 180 mg/dL (1-2 hours)      Critically ill patients:  140 - 180 mg/dL   Lab Results  Component Value Date   GLUCAP 193 (H) 03/27/2019   HGBA1C 7.8 (H) 03/27/2019    Review of Glycemic Control  Diabetes history: DM1 Outpatient Diabetes medications: Insulin pump - OmniPod Current orders for Inpatient glycemic control: Novolog 0-6 units tidwc and 0-5 units QHS  Does not have pump supplies from home, therefore will d/c insulin pump. HgbA1C - 7.8%  INSULIN PUMP settings - 0.55/hour (updated on 12/01/18)  with total basal 13.2  Carbohydrate coverage 1: 6, sensitivity 1: 60 and active insulin 4 hours. Blood sugar target 135-140  Inpatient Diabetes Program Recommendations:     Lantus 12 units QHS Novolog 0-9 units tidwc Novolog 6 units tidwc for meal coverage insulin. Do not give if pt does not eat 50% meal  Will also call Dr. Ronnie Derby office for any other recs regarding basal/bolus insulin.  Will follow closely.  Thank you. Lorenda Peck, RD, LDN, CDE Inpatient Diabetes Coordinator [phone]

## 2019-03-28 DIAGNOSIS — I10 Essential (primary) hypertension: Secondary | ICD-10-CM

## 2019-03-28 DIAGNOSIS — I2510 Atherosclerotic heart disease of native coronary artery without angina pectoris: Secondary | ICD-10-CM

## 2019-03-28 DIAGNOSIS — E1065 Type 1 diabetes mellitus with hyperglycemia: Secondary | ICD-10-CM

## 2019-03-28 LAB — GLUCOSE, CAPILLARY
Glucose-Capillary: 207 mg/dL — ABNORMAL HIGH (ref 70–99)
Glucose-Capillary: 237 mg/dL — ABNORMAL HIGH (ref 70–99)
Glucose-Capillary: 260 mg/dL — ABNORMAL HIGH (ref 70–99)
Glucose-Capillary: 248 mg/dL — ABNORMAL HIGH (ref 70–99)
Glucose-Capillary: 304 mg/dL — ABNORMAL HIGH (ref 70–99)

## 2019-03-28 LAB — CBC WITH DIFFERENTIAL/PLATELET
Abs Immature Granulocytes: 0 10*3/uL (ref 0.00–0.07)
Basophils Absolute: 0 10*3/uL (ref 0.0–0.1)
Basophils Relative: 0 %
Eosinophils Absolute: 0 10*3/uL (ref 0.0–0.5)
Eosinophils Relative: 0 %
HCT: 36.4 % (ref 36.0–46.0)
Hemoglobin: 12.8 g/dL (ref 12.0–15.0)
Lymphocytes Relative: 7 %
Lymphs Abs: 0.4 10*3/uL — ABNORMAL LOW (ref 0.7–4.0)
MCH: 35.8 pg — ABNORMAL HIGH (ref 26.0–34.0)
MCHC: 35.2 g/dL (ref 30.0–36.0)
MCV: 101.7 fL — ABNORMAL HIGH (ref 80.0–100.0)
Monocytes Absolute: 0.2 10*3/uL (ref 0.1–1.0)
Monocytes Relative: 3 %
Neutro Abs: 4.9 10*3/uL (ref 1.7–7.7)
Neutrophils Relative %: 90 %
Platelets: 209 10*3/uL (ref 150–400)
RBC: 3.58 MIL/uL — ABNORMAL LOW (ref 3.87–5.11)
RDW: 13.4 % (ref 11.5–15.5)
WBC: 5.4 10*3/uL (ref 4.0–10.5)
nRBC: 0 % (ref 0.0–0.2)

## 2019-03-28 LAB — C-REACTIVE PROTEIN: CRP: 5.1 mg/dL — ABNORMAL HIGH (ref <1.0)

## 2019-03-28 LAB — COMPREHENSIVE METABOLIC PANEL
ALT: 26 U/L (ref 0–44)
AST: 29 U/L (ref 15–41)
Albumin: 3 g/dL — ABNORMAL LOW (ref 3.5–5.0)
Alkaline Phosphatase: 93 U/L (ref 38–126)
Anion gap: 11 (ref 5–15)
BUN: 24 mg/dL — ABNORMAL HIGH (ref 8–23)
CO2: 25 mmol/L (ref 22–32)
Calcium: 8.6 mg/dL — ABNORMAL LOW (ref 8.9–10.3)
Chloride: 97 mmol/L — ABNORMAL LOW (ref 98–111)
Creatinine, Ser: 0.68 mg/dL (ref 0.44–1.00)
GFR calc Af Amer: >60 mL/min (ref >60)
GFR calc non Af Amer: >60 mL/min (ref >60)
Glucose, Bld: 227 mg/dL — ABNORMAL HIGH (ref 70–99)
Potassium: 4.2 mmol/L (ref 3.5–5.1)
Sodium: 133 mmol/L — ABNORMAL LOW (ref 135–145)
Total Bilirubin: 0.4 mg/dL (ref 0.3–1.2)
Total Protein: 5.7 g/dL — ABNORMAL LOW (ref 6.5–8.1)

## 2019-03-28 LAB — D-DIMER, QUANTITATIVE: D-Dimer, Quant: 0.81 ug/mL-FEU — ABNORMAL HIGH (ref 0.00–0.50)

## 2019-03-28 LAB — FERRITIN: Ferritin: 286 ng/mL (ref 11–307)

## 2019-03-28 MED ORDER — INSULIN ASPART 100 UNIT/ML ~~LOC~~ SOLN
0.0000 [IU] | Freq: Every day | SUBCUTANEOUS | Status: DC
  Administered 2019-03-28: 4 [IU] via SUBCUTANEOUS
  Administered 2019-03-29: 3 [IU] via SUBCUTANEOUS

## 2019-03-28 MED ORDER — INSULIN DETEMIR 100 UNIT/ML ~~LOC~~ SOLN
12.0000 [IU] | Freq: Two times a day (BID) | SUBCUTANEOUS | Status: DC
  Administered 2019-03-28 (×2): 12 [IU] via SUBCUTANEOUS
  Filled 2019-03-28 (×3): qty 0.12

## 2019-03-28 MED ORDER — INSULIN ASPART 100 UNIT/ML ~~LOC~~ SOLN
0.0000 [IU] | Freq: Three times a day (TID) | SUBCUTANEOUS | Status: DC
  Administered 2019-03-28: 5 [IU] via SUBCUTANEOUS
  Administered 2019-03-29: 3 [IU] via SUBCUTANEOUS
  Administered 2019-03-29: 5 [IU] via SUBCUTANEOUS
  Administered 2019-03-29: 8 [IU] via SUBCUTANEOUS
  Administered 2019-03-30: 3 [IU] via SUBCUTANEOUS

## 2019-03-28 MED ORDER — INSULIN ASPART 100 UNIT/ML ~~LOC~~ SOLN
4.0000 [IU] | Freq: Three times a day (TID) | SUBCUTANEOUS | Status: DC
  Administered 2019-03-28: 4 [IU] via SUBCUTANEOUS

## 2019-03-28 NOTE — Progress Notes (Addendum)
Night RN reported that patient had home med's in room. Patient verified medications taken this am Aspirin 81 mg, Calcium/Vit D 600/4001 , carvedilol  6.25 mg, Ezetimibe 10 mg, 0.5 tab Isosorbide mono 60 mg, Lisinopril 10 mg. Per pharmacy recommendation medications are to be sealed in pharmacy bag in patients room. Pt provided education regarding home medication policy and safety measures. Pt agreeable to plan of care.

## 2019-03-28 NOTE — Progress Notes (Signed)
PROGRESS NOTE  Natasha Hernandez MBW:[id number] DOB: 1941-04-08   PCP: Salley Scarlet, MD  Patient is from: Home  DOA: 03/26/2019 LOS: 1  Brief Narrative / Interim history: 78 year old female with history of DM-1 on insulin pump, HTN, HLD, PAD and CAD presenting with nausea, vomiting, fever, chills and mild nonproductive cough and generalized weakness, and admitted for COVID-19 pneumonia.  In ED, COVID-19 test positive.  CXR with RML and RLL patchy airspace opacity concerning for viral pneumonia.  Hemodynamically stable.  Saturating at 97% on RA.  Sodium 130.  CRP 7.1.  Ferritin 301.  LA 1.0.  D-dimer 1.1.  Fibrinogen 459.  Admitted for COVID-19 pneumonia and started on Decadron and remdesivir.  Subjective: No major events overnight of this morning.  No complaint this morning.  Saturating in low 90s on RA.  Reports intermittent cough but denies dyspnea.  Denies chest pain, nausea, vomiting or abdominal pain.  Denies UTI symptoms.  Objective: Vitals:   03/27/19 1808 03/27/19 2053 03/28/19 0540 03/28/19 1351  BP: (!) 178/69 (!) 114/49 (!) 143/59 (!) 143/55  Pulse: 77 76 70 81  Resp: 16 16 18 18   Temp: 98.9 F (37.2 C) 99.2 F (37.3 C) 98.8 F (37.1 C) 98.3 F (36.8 C)  TempSrc: Oral Oral Oral Oral  SpO2: 97% 92% 92% 97%  Weight:      Height:        Intake/Output Summary (Last 24 hours) at 03/28/2019 1418 Last data filed at 03/28/2019 0500 Gross per 24 hour  Intake 250 ml  Output -  Net 250 ml   Filed Weights   03/26/19 2129 03/27/19 1807  Weight: 68 kg 72.7 kg    Examination:  GENERAL: No acute distress.  Appears well.  HEENT: MMM.  Vision and hearing grossly intact.  NECK: Supple.  No apparent JVD.  RESP:  No IWOB. Good air movement bilaterally. CVS:  RRR. Heart sounds normal.  ABD/GI/GU: Bowel sounds present. Soft. Non tender.  MSK/EXT:  No apparent deformity or edema. Moves extremities. SKIN: no apparent skin lesion or wound NEURO: Awake, alert and oriented  appropriately.  No gross deficit.  PSYCH: Calm. Normal affect.  Procedures:  None  Assessment & Plan: COVID-19 pneumonia: On room air.  Has significantly elevated CRP and CXR concerning for COVID-19 infection. -Inflammatory markers improved. -Continue remdesivir and Decadron per protocol 12/8>> -Supportive care with breathing treatments, mucolytic's, antitussives and vitamins. -Trend inflammatory markers. Recent Labs    03/26/19 2255 03/28/19 0401  DDIMER 1.21* 0.81*  FERRITIN 301 286  LDH 113  --   CRP 7.1* 5.1*    Uncontrolled DM-1 with hyperglycemia: On insulin pump at home.  Hyperglycemia likely due to steroid. Recent Labs    03/28/19 0502 03/28/19 0921 03/28/19 1208  GLUCAP 207* 237* 260*  -Levemir 12 units twice daily instead of Lantus 12 units nightly -NovoLog 4 units AC and SSI-moderate with night coverage -Continue statin, Zetia and CBG monitoring -Check hemoglobin A1c  Nausea/vomiting: Resolved.  Likely due to the above. -As needed antiemetics  Hyponatremia: Improved. -Continue monitoring  History of CAD/PAD: Stable. -Continue Coreg, Imdur, aspirin, statin and Zetia.  Essential hypertension: BP slightly elevated. -Continue cardiac meds as above                 DVT prophylaxis: Subcu Lovenox Code Status: Full code Family Communication: Patient and/or RN.  Will update family later. Disposition Plan: Remains inpatient Consultants: None   Microbiology summarized: COVID-19 positive.  Sch Meds:  Scheduled Meds: .  aspirin EC  81 mg Oral Daily  . carvedilol  6.25 mg Oral BID WC  . dexamethasone (DECADRON) injection  6 mg Intravenous Q24H  . enoxaparin (LOVENOX) injection  40 mg Subcutaneous Q24H  . ezetimibe  10 mg Oral Daily  . folic acid  1 mg Oral Daily  . insulin aspart  0-15 Units Subcutaneous TID WC  . insulin aspart  0-5 Units Subcutaneous QHS  . insulin aspart  4 Units Subcutaneous TID WC  . insulin detemir  12 Units Subcutaneous  BID  . isosorbide mononitrate  60 mg Oral Daily  . lisinopril  10 mg Oral Daily  . multivitamin with minerals  1 tablet Oral Daily  . rosuvastatin  40 mg Oral Daily  . thiamine  100 mg Oral Daily  . vitamin C  500 mg Oral Daily  . zinc sulfate  220 mg Oral Daily   Continuous Infusions: . remdesivir 100 mg in NS 100 mL 100 mg (03/28/19 0958)   PRN Meds:.  Antimicrobials: Anti-infectives (From admission, onward)   Start     Dose/Rate Route Frequency Ordered Stop   03/28/19 1000  remdesivir 100 mg in sodium chloride 0.9 % 100 mL IVPB     100 mg 200 mL/hr over 30 Minutes Intravenous Daily 03/27/19 1508 04/01/19 0959   03/27/19 1600  remdesivir 200 mg in sodium chloride 0.9% 250 mL IVPB     200 mg 580 mL/hr over 30 Minutes Intravenous Once 03/27/19 1508 03/27/19 1746   03/27/19 0130  azithromycin (ZITHROMAX) 500 mg in sodium chloride 0.9 % 250 mL IVPB  Status:  Discontinued     500 mg 250 mL/hr over 60 Minutes Intravenous Every 24 hours 03/27/19 0127 03/27/19 1447   03/27/19 0130  cefTRIAXone (ROCEPHIN) 1 g in sodium chloride 0.9 % 100 mL IVPB  Status:  Discontinued     1 g 200 mL/hr over 30 Minutes Intravenous Every 24 hours 03/27/19 0127 03/27/19 1447       I have personally reviewed the following labs and images: CBC: Recent Labs  Lab 03/26/19 2135 03/27/19 0202 03/28/19 0401  WBC 5.1 3.8* 5.4  NEUTROABS 4.4  --  4.9  HGB 13.4 11.9* 12.8  HCT 40.6 36.2 36.4  MCV 100.7* 102.0* 101.7*  PLT 179 149* 209   BMP &GFR Recent Labs  Lab 03/26/19 2135 03/27/19 0202 03/28/19 0401  NA 130*  --  133*  K 4.0  --  4.2  CL 90*  --  97*  CO2 29  --  25  GLUCOSE 129*  --  227*  BUN 17  --  24*  CREATININE 0.63 0.54 0.68  CALCIUM 9.0  --  8.6*   Estimated Creatinine Clearance: 58.2 mL/min (by C-G formula based on SCr of 0.68 mg/dL). Liver & Pancreas: Recent Labs  Lab 03/26/19 2135 03/28/19 0401  AST 30 29  ALT 27 26  ALKPHOS 106 93  BILITOT 0.7 0.4  PROT 6.5 5.7*   ALBUMIN 3.5 3.0*   Recent Labs  Lab 03/26/19 2135  LIPASE 22   No results for input(s): AMMONIA in the last 168 hours. Diabetic: Recent Labs    03/27/19 0202  HGBA1C 7.8*   Recent Labs  Lab 03/27/19 1904 03/27/19 2049 03/28/19 0502 03/28/19 0921 03/28/19 1208  GLUCAP 131* 284* 207* 237* 260*   Cardiac Enzymes: No results for input(s): CKTOTAL, CKMB, CKMBINDEX, TROPONINI in the last 168 hours. No results for input(s): PROBNP in the last 8760 hours. Coagulation Profile:  No results for input(s): INR, PROTIME in the last 168 hours. Thyroid Function Tests: No results for input(s): TSH, T4TOTAL, FREET4, T3FREE, THYROIDAB in the last 72 hours. Lipid Profile: No results for input(s): CHOL, HDL, LDLCALC, TRIG, CHOLHDL, LDLDIRECT in the last 72 hours. Anemia Panel: Recent Labs    03/26/19 2255 03/28/19 0401  FERRITIN 301 286   Urine analysis:    Component Value Date/Time   COLORURINE YELLOW 08/24/2018 0941   APPEARANCEUR CLOUDY (A) 08/24/2018 0941   LABSPEC 1.020 08/24/2018 0941   PHURINE 6.0 08/24/2018 0941   GLUCOSEU NEGATIVE 08/24/2018 0941   GLUCOSEU 100 (A) 05/04/2016 1535   HGBUR 2+ (A) 08/24/2018 0941   BILIRUBINUR NEGATIVE 05/04/2016 1535   KETONESUR NEGATIVE 08/24/2018 0941   PROTEINUR 2+ (A) 08/24/2018 0941   UROBILINOGEN 0.2 05/04/2016 1535   NITRITE NEGATIVE 08/24/2018 0941   LEUKOCYTESUR 3+ (A) 08/24/2018 0941   Sepsis Labs: Invalid input(s): PROCALCITONIN, LACTICIDVEN  Microbiology: Recent Results (from the past 240 hour(s))  Respiratory Panel by RT PCR (Flu A&B, Covid) - Nasopharyngeal Swab     Status: Abnormal   Collection Time: 03/26/19 10:34 PM   Specimen: Nasopharyngeal Swab  Result Value Ref Range Status   SARS Coronavirus 2 by RT PCR POSITIVE (A) NEGATIVE Final    Comment: RESULT CALLED TO, READ BACK BY AND VERIFIED WITH: BOWEN,MIKE @ 0756 03/27/2019 PERRY, J. (NOTE) SARS-CoV-2 target nucleic acids are DETECTED. SARS-CoV-2 RNA is  generally detectable in upper respiratory specimens  during the acute phase of infection. Positive results are indicative of the presence of the identified virus, but do not rule out bacterial infection or co-infection with other pathogens not detected by the test. Clinical correlation with patient history and other diagnostic information is necessary to determine patient infection status. The expected result is Negative. Fact Sheet for Patients:  https://www.moore.com/https://www.fda.gov/media/142436/download Fact Sheet for Healthcare Providers: https://www.young.biz/https://www.fda.gov/media/142435/download This test is not yet approved or cleared by the [place] FDA and  has been authorized for detection and/or diagnosis of SARS-CoV-2 by FDA under an Emergency Use Authorization (EUA).  This EUA will remain in effect (meaning this test can be used)  for the duration of  the COVID-19 declaration under Section 564(b)(1) of the Act, 21 U.S.C. section 360bbb-3(b)(1), unless the authorization is terminated or revoked sooner.    Influenza A by PCR NEGATIVE NEGATIVE Final   Influenza B by PCR NEGATIVE NEGATIVE Final    Comment: (NOTE) The Xpert Xpress SARS-CoV-2/FLU/RSV assay is intended as an aid in  the diagnosis of influenza from Nasopharyngeal swab specimens and  should not be used as a sole basis for treatment. Nasal washings and  aspirates are unacceptable for Xpert Xpress SARS-CoV-2/FLU/RSV  testing. Fact Sheet for Patients: https://www.moore.com/https://www.fda.gov/media/142436/download Fact Sheet for Healthcare Providers: https://www.young.biz/https://www.fda.gov/media/142435/download This test is not yet approved or cleared by the [place] FDA and  has been authorized for detection and/or diagnosis of SARS-CoV-2 by  FDA under an Emergency Use Authorization (EUA). This EUA will remain  in effect (meaning this test can be used) for the duration of the  Covid-19 declaration under Section 564(b)(1) of the Act, 21  U.S.C. section 360bbb-3(b)(1), unless the  authorization is  terminated or revoked. Performed at [facility], [address] [place], [place] [id number]     Radiology Studies: No results found.  35 minutes with more than 50% spent in reviewing records, counseling patient/family and coordinating care.   Ayub Kirsh T. Linzy Laury [facility]  If 7PM-7AM, please contact night-coverage [email or web address]  Password [email or web address] 03/28/2019, 2:18 PM

## 2019-03-28 NOTE — Progress Notes (Signed)
Pt has a blood glucose of 207 fpr the 3AM reading.  Pt also has a set of her home medications with her. I  Addition, her insulin ump has run out. She has questions as to whether she should continue to take her med packs from home in addition to her new medications ordered here. Expresses concerns regarding taking medications twice. Please ask the pt. If she has taken her home medications prior to administration of meds.  Pts. Insulin pump has also ran out. Pharmacy was called for replacement cartridge but they state that "they do not carry the cartridge".Insulin pump cannot be resumed as such.  A bottle of Novolog was tubed up for SQ injections.

## 2019-03-29 DIAGNOSIS — K5900 Constipation, unspecified: Secondary | ICD-10-CM

## 2019-03-29 LAB — GLUCOSE, CAPILLARY
Glucose-Capillary: 196 mg/dL — ABNORMAL HIGH (ref 70–99)
Glucose-Capillary: 180 mg/dL — ABNORMAL HIGH (ref 70–99)
Glucose-Capillary: 239 mg/dL — ABNORMAL HIGH (ref 70–99)
Glucose-Capillary: 280 mg/dL — ABNORMAL HIGH (ref 70–99)
Glucose-Capillary: 281 mg/dL — ABNORMAL HIGH (ref 70–99)

## 2019-03-29 LAB — C-REACTIVE PROTEIN: CRP: 2.5 mg/dL — ABNORMAL HIGH (ref <1.0)

## 2019-03-29 LAB — CBC WITH DIFFERENTIAL/PLATELET
Abs Immature Granulocytes: 0.04 10*3/uL (ref 0.00–0.07)
Basophils Absolute: 0 10*3/uL (ref 0.0–0.1)
Basophils Relative: 0 %
Eosinophils Absolute: 0 10*3/uL (ref 0.0–0.5)
Eosinophils Relative: 0 %
HCT: 37.9 % (ref 36.0–46.0)
Hemoglobin: 12.7 g/dL (ref 12.0–15.0)
Immature Granulocytes: 1 %
Lymphocytes Relative: 8 %
Lymphs Abs: 0.5 10*3/uL — ABNORMAL LOW (ref 0.7–4.0)
MCH: 33.9 pg (ref 26.0–34.0)
MCHC: 33.5 g/dL (ref 30.0–36.0)
MCV: 101.1 fL — ABNORMAL HIGH (ref 80.0–100.0)
Monocytes Absolute: 0.2 10*3/uL (ref 0.1–1.0)
Monocytes Relative: 3 %
Neutro Abs: 5.8 10*3/uL (ref 1.7–7.7)
Neutrophils Relative %: 88 %
Platelets: 248 10*3/uL (ref 150–400)
RBC: 3.75 MIL/uL — ABNORMAL LOW (ref 3.87–5.11)
RDW: 13.3 % (ref 11.5–15.5)
WBC: 6.6 10*3/uL (ref 4.0–10.5)
nRBC: 0 % (ref 0.0–0.2)

## 2019-03-29 LAB — FERRITIN: Ferritin: 273 ng/mL (ref 11–307)

## 2019-03-29 LAB — COMPREHENSIVE METABOLIC PANEL
ALT: 29 U/L (ref 0–44)
AST: 27 U/L (ref 15–41)
Albumin: 3.1 g/dL — ABNORMAL LOW (ref 3.5–5.0)
Alkaline Phosphatase: 90 U/L (ref 38–126)
Anion gap: 10 (ref 5–15)
BUN: 26 mg/dL — ABNORMAL HIGH (ref 8–23)
CO2: 28 mmol/L (ref 22–32)
Calcium: 8.7 mg/dL — ABNORMAL LOW (ref 8.9–10.3)
Chloride: 97 mmol/L — ABNORMAL LOW (ref 98–111)
Creatinine, Ser: 0.7 mg/dL (ref 0.44–1.00)
GFR calc Af Amer: >60 mL/min (ref >60)
GFR calc non Af Amer: >60 mL/min (ref >60)
Glucose, Bld: 212 mg/dL — ABNORMAL HIGH (ref 70–99)
Potassium: 4.1 mmol/L (ref 3.5–5.1)
Sodium: 135 mmol/L (ref 135–145)
Total Bilirubin: 0.6 mg/dL (ref 0.3–1.2)
Total Protein: 5.9 g/dL — ABNORMAL LOW (ref 6.5–8.1)

## 2019-03-29 LAB — D-DIMER, QUANTITATIVE: D-Dimer, Quant: 0.73 ug/mL-FEU — ABNORMAL HIGH (ref 0.00–0.50)

## 2019-03-29 MED ORDER — SENNOSIDES-DOCUSATE SODIUM 8.6-50 MG PO TABS
1.0000 | ORAL_TABLET | Freq: Every day | ORAL | Status: DC
  Administered 2019-03-29 – 2019-03-30 (×2): 1 via ORAL
  Filled 2019-03-29 (×2): qty 1

## 2019-03-29 MED ORDER — INSULIN DETEMIR 100 UNIT/ML ~~LOC~~ SOLN
15.0000 [IU] | Freq: Two times a day (BID) | SUBCUTANEOUS | Status: DC
  Administered 2019-03-29 – 2019-03-30 (×3): 15 [IU] via SUBCUTANEOUS
  Filled 2019-03-29 (×4): qty 0.15

## 2019-03-29 MED ORDER — POLYETHYLENE GLYCOL 3350 17 G PO PACK
17.0000 g | PACK | Freq: Every day | ORAL | Status: DC
  Administered 2019-03-29: 17 g via ORAL
  Filled 2019-03-29 (×2): qty 1

## 2019-03-29 MED ORDER — INSULIN ASPART 100 UNIT/ML ~~LOC~~ SOLN
6.0000 [IU] | Freq: Three times a day (TID) | SUBCUTANEOUS | Status: DC
  Administered 2019-03-29 (×2): 6 [IU] via SUBCUTANEOUS

## 2019-03-29 MED ORDER — AMLODIPINE BESYLATE 5 MG PO TABS
5.0000 mg | ORAL_TABLET | Freq: Every day | ORAL | Status: DC
  Administered 2019-03-29 – 2019-03-30 (×2): 5 mg via ORAL
  Filled 2019-03-29 (×2): qty 1

## 2019-03-29 NOTE — Progress Notes (Signed)
PROGRESS NOTE  Natasha Hernandez TZG:[id number] DOB: 1940-11-19   PCP: Salley Scarlet, MD  Patient is from: Home  DOA: 03/26/2019 LOS: 2  Brief Narrative / Interim history: 78 year old female with history of DM-1 on insulin pump, HTN, HLD, PAD and CAD presenting with nausea, vomiting, fever, chills and mild nonproductive cough and generalized weakness, and admitted for COVID-19 pneumonia.  In ED, COVID-19 test positive.  CXR with RML and RLL patchy airspace opacity concerning for viral pneumonia.  Hemodynamically stable.  Saturating at 97% on RA.  Sodium 130.  CRP 7.1.  Ferritin 301.  LA 1.0.  D-dimer 1.1.  Fibrinogen 459.  Admitted for COVID-19 pneumonia and started on Decadron and remdesivir.  Subjective: No major events overnight or this morning.  No complaints other than constipation.  She says she has not had a bowel movement in 2 to 3 days.  Shortness of breath and cough improved.  Denies chest pain, nausea, vomiting, diarrhea or UTI symptoms.  Objective: Vitals:   03/28/19 0540 03/28/19 1351 03/28/19 2118 03/29/19 0631  BP: (!) 143/59 (!) 143/55 (!) 140/50 (!) 170/67  Pulse: 70 81 77 70  Resp: 18 18 20 18   Temp: 98.8 F (37.1 C) 98.3 F (36.8 C) 99.1 F (37.3 C) 97.9 F (36.6 C)  TempSrc: Oral Oral Oral Oral  SpO2: 92% 97% 95% 95%  Weight:      Height:        Intake/Output Summary (Last 24 hours) at 03/29/2019 1143 Last data filed at 03/29/2019 0300 Gross per 24 hour  Intake 110 ml  Output -  Net 110 ml   Filed Weights   03/26/19 2129 03/27/19 1807  Weight: 68 kg 72.7 kg    Examination:  GENERAL: No acute distress.  Appears well.  HEENT: MMM.  Vision and hearing grossly intact.  NECK: Supple.  No apparent JVD.  RESP:  No IWOB.  Fair aeration bilaterally. CVS:  RRR. Heart sounds normal.  ABD/GI/GU: Bowel sounds present. Soft. Non tender.  MSK/EXT:  Moves extremities. No apparent deformity or edema.  SKIN: no apparent skin lesion or wound NEURO: Awake,  alert and oriented appropriately.  No apparent focal neuro deficit. PSYCH: Calm. Normal affect.  Procedures:  None  Assessment & Plan: COVID-19 pneumonia: On room air.  Elevated CRP and CXR concerning for COVID-19 infection. -Inflammatory markers improved. -Continue remdesivir and Decadron per protocol 12/8>> -Supportive care with breathing treatments, mucolytic's, antitussives and vitamins. -Trend inflammatory markers. Recent Labs    03/26/19 2255 03/28/19 0401 03/29/19 0437  DDIMER 1.21* 0.81* 0.73*  FERRITIN 301 286 273  LDH 113  --   --   CRP 7.1* 5.1* 2.5*    Uncontrolled DM-1 with hyperglycemia: On insulin pump at home.  Hyperglycemia likely due to steroid. Recent Labs    03/28/19 2121 03/29/19 0435 03/29/19 0746  GLUCAP 304* 196* 180*  -Increased Levemir 12 to 15 units twice daily -Increased NovoLog 4 to 6 units AC  -Continue SSI-moderate with night coverage -Continue statin, Zetia and CBG monitoring -Check hemoglobin A1c  Nausea/vomiting: Resolved.  Likely due to the above. -As needed antiemetics  Hyponatremia: Resolved. -Continue monitoring  History of CAD/PAD: Stable. -Continue Coreg, Imdur, lisinopril, aspirin, statin and Zetia.  Essential hypertension: BP elevated. -Add amlodipine 5 mg daily -Continue Coreg, Imdur and lisinopril  Constipation -Start Senokot-S and MiraLAX               DVT prophylaxis: Subcu Lovenox Code Status: Full code Family Communication: Updated patient's  son and daughter-in-law over the phone 12/9. Disposition Plan: Anticipate discharge home 12/11 if remains stable Consultants: None   Microbiology summarized: COVID-19 positive.  Sch Meds:  Scheduled Meds: . amLODipine  5 mg Oral Daily  . aspirin EC  81 mg Oral Daily  . carvedilol  6.25 mg Oral BID WC  . dexamethasone (DECADRON) injection  6 mg Intravenous Q24H  . enoxaparin (LOVENOX) injection  40 mg Subcutaneous Q24H  . ezetimibe  10 mg Oral Daily  .  folic acid  1 mg Oral Daily  . insulin aspart  0-15 Units Subcutaneous TID WC  . insulin aspart  0-5 Units Subcutaneous QHS  . insulin aspart  6 Units Subcutaneous TID WC  . insulin detemir  15 Units Subcutaneous BID  . isosorbide mononitrate  60 mg Oral Daily  . lisinopril  10 mg Oral Daily  . multivitamin with minerals  1 tablet Oral Daily  . polyethylene glycol  17 g Oral Daily  . rosuvastatin  40 mg Oral Daily  . senna-docusate  1 tablet Oral Daily  . thiamine  100 mg Oral Daily  . vitamin C  500 mg Oral Daily  . zinc sulfate  220 mg Oral Daily   Continuous Infusions: . remdesivir 100 mg in NS 100 mL 100 mg (03/29/19 1058)   PRN Meds:.  Antimicrobials: Anti-infectives (From admission, onward)   Start     Dose/Rate Route Frequency Ordered Stop   03/28/19 1000  remdesivir 100 mg in sodium chloride 0.9 % 100 mL IVPB     100 mg 200 mL/hr over 30 Minutes Intravenous Daily 03/27/19 1508 04/01/19 0959   03/27/19 1600  remdesivir 200 mg in sodium chloride 0.9% 250 mL IVPB     200 mg 580 mL/hr over 30 Minutes Intravenous Once 03/27/19 1508 03/27/19 1746   03/27/19 0130  azithromycin (ZITHROMAX) 500 mg in sodium chloride 0.9 % 250 mL IVPB  Status:  Discontinued     500 mg 250 mL/hr over 60 Minutes Intravenous Every 24 hours 03/27/19 0127 03/27/19 1447   03/27/19 0130  cefTRIAXone (ROCEPHIN) 1 g in sodium chloride 0.9 % 100 mL IVPB  Status:  Discontinued     1 g 200 mL/hr over 30 Minutes Intravenous Every 24 hours 03/27/19 0127 03/27/19 1447       I have personally reviewed the following labs and images: CBC: Recent Labs  Lab 03/26/19 2135 03/27/19 0202 03/28/19 0401 03/29/19 0437  WBC 5.1 3.8* 5.4 6.6  NEUTROABS 4.4  --  4.9 5.8  HGB 13.4 11.9* 12.8 12.7  HCT 40.6 36.2 36.4 37.9  MCV 100.7* 102.0* 101.7* 101.1*  PLT 179 149* 209 248   BMP &GFR Recent Labs  Lab 03/26/19 2135 03/27/19 0202 03/28/19 0401 03/29/19 0437  NA 130*  --  133* 135  K 4.0  --  4.2 4.1   CL 90*  --  97* 97*  CO2 29  --  25 28  GLUCOSE 129*  --  227* 212*  BUN 17  --  24* 26*  CREATININE 0.63 0.54 0.68 0.70  CALCIUM 9.0  --  8.6* 8.7*   Estimated Creatinine Clearance: 58.2 mL/min (by C-G formula based on SCr of 0.7 mg/dL). Liver & Pancreas: Recent Labs  Lab 03/26/19 2135 03/28/19 0401 03/29/19 0437  AST 30 29 27   ALT 27 26 29   ALKPHOS 106 93 90  BILITOT 0.7 0.4 0.6  PROT 6.5 5.7* 5.9*  ALBUMIN 3.5 3.0* 3.1*   Recent Labs  Lab 03/26/19 2135  LIPASE 22   No results for input(s): AMMONIA in the last 168 hours. Diabetic: Recent Labs    03/27/19 0202  HGBA1C 7.8*   Recent Labs  Lab 03/28/19 1208 03/28/19 1657 03/28/19 2121 03/29/19 0435 03/29/19 0746  GLUCAP 260* 248* 304* 196* 180*   Cardiac Enzymes: No results for input(s): CKTOTAL, CKMB, CKMBINDEX, TROPONINI in the last 168 hours. No results for input(s): PROBNP in the last 8760 hours. Coagulation Profile: No results for input(s): INR, PROTIME in the last 168 hours. Thyroid Function Tests: No results for input(s): TSH, T4TOTAL, FREET4, T3FREE, THYROIDAB in the last 72 hours. Lipid Profile: No results for input(s): CHOL, HDL, LDLCALC, TRIG, CHOLHDL, LDLDIRECT in the last 72 hours. Anemia Panel: Recent Labs    03/28/19 0401 03/29/19 0437  FERRITIN 286 273   Urine analysis:    Component Value Date/Time   COLORURINE YELLOW 08/24/2018 0941   APPEARANCEUR CLOUDY (A) 08/24/2018 0941   LABSPEC 1.020 08/24/2018 0941   PHURINE 6.0 08/24/2018 0941   GLUCOSEU NEGATIVE 08/24/2018 0941   GLUCOSEU 100 (A) 05/04/2016 1535   HGBUR 2+ (A) 08/24/2018 0941   BILIRUBINUR NEGATIVE 05/04/2016 1535   KETONESUR NEGATIVE 08/24/2018 0941   PROTEINUR 2+ (A) 08/24/2018 0941   UROBILINOGEN 0.2 05/04/2016 1535   NITRITE NEGATIVE 08/24/2018 0941   LEUKOCYTESUR 3+ (A) 08/24/2018 0941   Sepsis Labs: Invalid input(s): PROCALCITONIN, LACTICIDVEN  Microbiology: Recent Results (from the past 240 hour(s))   Respiratory Panel by RT PCR (Flu A&B, Covid) - Nasopharyngeal Swab     Status: Abnormal   Collection Time: 03/26/19 10:34 PM   Specimen: Nasopharyngeal Swab  Result Value Ref Range Status   SARS Coronavirus 2 by RT PCR POSITIVE (A) NEGATIVE Final    Comment: RESULT CALLED TO, READ BACK BY AND VERIFIED WITH: BOWEN,MIKE @ 0756 03/27/2019 PERRY, J. (NOTE) SARS-CoV-2 target nucleic acids are DETECTED. SARS-CoV-2 RNA is generally detectable in upper respiratory specimens  during the acute phase of infection. Positive results are indicative of the presence of the identified virus, but do not rule out bacterial infection or co-infection with other pathogens not detected by the test. Clinical correlation with patient history and other diagnostic information is necessary to determine patient infection status. The expected result is Negative. Fact Sheet for Patients:  https://www.moore.com/ Fact Sheet for Healthcare Providers: https://www.young.biz/ This test is not yet approved or cleared by the [place] FDA and  has been authorized for detection and/or diagnosis of SARS-CoV-2 by FDA under an Emergency Use Authorization (EUA).  This EUA will remain in effect (meaning this test can be used)  for the duration of  the COVID-19 declaration under Section 564(b)(1) of the Act, 21 U.S.C. section 360bbb-3(b)(1), unless the authorization is terminated or revoked sooner.    Influenza A by PCR NEGATIVE NEGATIVE Final   Influenza B by PCR NEGATIVE NEGATIVE Final    Comment: (NOTE) The Xpert Xpress SARS-CoV-2/FLU/RSV assay is intended as an aid in  the diagnosis of influenza from Nasopharyngeal swab specimens and  should not be used as a sole basis for treatment. Nasal washings and  aspirates are unacceptable for Xpert Xpress SARS-CoV-2/FLU/RSV  testing. Fact Sheet for Patients: https://www.moore.com/ Fact Sheet for Healthcare Providers:  https://www.young.biz/ This test is not yet approved or cleared by the [place] FDA and  has been authorized for detection and/or diagnosis of SARS-CoV-2 by  FDA under an Emergency Use Authorization (EUA). This EUA will remain  in effect (meaning this test can  be used) for the duration of the  Covid-19 declaration under Section 564(b)(1) of the Act, 21  U.S.C. section 360bbb-3(b)(1), unless the authorization is  terminated or revoked. Performed at [facility], [place] [address], [place], [place] [address]     Radiology Studies: No results found.  35 minutes with more than 50% spent in reviewing records, counseling patient/family and coordinating care.   Rene Gonsoulin T. [place]  If 7PM-7AM, please contact night-coverage [email or web address] Password [email or web address] 03/29/2019, 11:43 AM

## 2019-03-29 NOTE — Care Management Important Message (Signed)
Important Message  Patient Details IM Letter given to Gabriel Earing RN Case Manager to present to the Patient Name: Natasha Hernandez MRN: [id number] Date of Birth: 11/23/1940   Medicare Important Message Given:  Yes     Kerin Salen 03/29/2019, 3:05 PM

## 2019-03-29 NOTE — Progress Notes (Signed)
SATURATION QUALIFICATIONS: (This note is used to comply with regulatory documentation for home oxygen)  Patient Saturations on Room Air at Rest = 95%  Patient Saturations on Room Air while Ambulating = 93%  Patient Saturations on n/a Liters of oxygen while Ambulating = n/a%  Please briefly explain why patient needs home oxygen: patient does not require oxygen

## 2019-03-29 NOTE — Plan of Care (Signed)
Patient up in chair and walking around the room for most of shift, denies shortness of breath with exertion.  Oxygen saturation remains in the mid 90s on room air.  Tolerating carb mod diet without complaint.

## 2019-03-30 DIAGNOSIS — I25118 Atherosclerotic heart disease of native coronary artery with other forms of angina pectoris: Secondary | ICD-10-CM

## 2019-03-30 LAB — CBC WITH DIFFERENTIAL/PLATELET
Abs Immature Granulocytes: 0.06 10*3/uL (ref 0.00–0.07)
Basophils Absolute: 0 10*3/uL (ref 0.0–0.1)
Basophils Relative: 0 %
Eosinophils Absolute: 0 10*3/uL (ref 0.0–0.5)
Eosinophils Relative: 0 %
HCT: 35.4 % — ABNORMAL LOW (ref 36.0–46.0)
Hemoglobin: 12.1 g/dL (ref 12.0–15.0)
Immature Granulocytes: 1 %
Lymphocytes Relative: 8 %
Lymphs Abs: 0.6 10*3/uL — ABNORMAL LOW (ref 0.7–4.0)
MCH: 35.3 pg — ABNORMAL HIGH (ref 26.0–34.0)
MCHC: 34.2 g/dL (ref 30.0–36.0)
MCV: 103.2 fL — ABNORMAL HIGH (ref 80.0–100.0)
Monocytes Absolute: 0.4 10*3/uL (ref 0.1–1.0)
Monocytes Relative: 4 %
Neutro Abs: 7.1 10*3/uL (ref 1.7–7.7)
Neutrophils Relative %: 87 %
Platelets: 264 10*3/uL (ref 150–400)
RBC: 3.43 MIL/uL — ABNORMAL LOW (ref 3.87–5.11)
RDW: 13.4 % (ref 11.5–15.5)
WBC: 8.1 10*3/uL (ref 4.0–10.5)
nRBC: 0 % (ref 0.0–0.2)

## 2019-03-30 LAB — COMPREHENSIVE METABOLIC PANEL
ALT: 34 U/L (ref 0–44)
AST: 33 U/L (ref 15–41)
Albumin: 3 g/dL — ABNORMAL LOW (ref 3.5–5.0)
Alkaline Phosphatase: 87 U/L (ref 38–126)
Anion gap: 11 (ref 5–15)
BUN: 27 mg/dL — ABNORMAL HIGH (ref 8–23)
CO2: 25 mmol/L (ref 22–32)
Calcium: 8.4 mg/dL — ABNORMAL LOW (ref 8.9–10.3)
Chloride: 99 mmol/L (ref 98–111)
Creatinine, Ser: 0.64 mg/dL (ref 0.44–1.00)
GFR calc Af Amer: >60 mL/min (ref >60)
GFR calc non Af Amer: >60 mL/min (ref >60)
Glucose, Bld: 122 mg/dL — ABNORMAL HIGH (ref 70–99)
Potassium: 4 mmol/L (ref 3.5–5.1)
Sodium: 135 mmol/L (ref 135–145)
Total Bilirubin: 0.4 mg/dL (ref 0.3–1.2)
Total Protein: 5.7 g/dL — ABNORMAL LOW (ref 6.5–8.1)

## 2019-03-30 LAB — GLUCOSE, CAPILLARY
Glucose-Capillary: 147 mg/dL — ABNORMAL HIGH (ref 70–99)
Glucose-Capillary: 122 mg/dL — ABNORMAL HIGH (ref 70–99)
Glucose-Capillary: 68 mg/dL — ABNORMAL LOW (ref 70–99)
Glucose-Capillary: 174 mg/dL — ABNORMAL HIGH (ref 70–99)

## 2019-03-30 LAB — HEMOGLOBIN A1C
Hgb A1c MFr Bld: 8.2 % — ABNORMAL HIGH (ref 4.8–5.6)
Mean Plasma Glucose: 188.64 mg/dL

## 2019-03-30 LAB — C-REACTIVE PROTEIN: CRP: 1.4 mg/dL — ABNORMAL HIGH (ref <1.0)

## 2019-03-30 LAB — FERRITIN: Ferritin: 282 ng/mL (ref 11–307)

## 2019-03-30 LAB — D-DIMER, QUANTITATIVE: D-Dimer, Quant: 0.63 ug/mL-FEU — ABNORMAL HIGH (ref 0.00–0.50)

## 2019-03-30 NOTE — Progress Notes (Signed)
Pt discharged home today per Dr. Cyndia Skeeters. Pt's IV site D/C'd and WDL. Pt's VSS. Pt provided with home medication list and discharge instructions. Verbalized understanding. Currently awaiting arrival of family for transport home.

## 2019-03-30 NOTE — Plan of Care (Signed)
  Problem: Health Behavior/Discharge Planning: Goal: Ability to manage health-related needs will improve Outcome: Completed/Met   Problem: Clinical Measurements: Goal: Ability to maintain clinical measurements within normal limits will improve Outcome: Completed/Met Goal: Will remain free from infection Outcome: Completed/Met Goal: Diagnostic test results will improve Outcome: Completed/Met Goal: Respiratory complications will improve Outcome: Completed/Met Goal: Cardiovascular complication will be avoided Outcome: Completed/Met   Problem: Activity: Goal: Risk for activity intolerance will decrease Outcome: Completed/Met   Problem: Nutrition: Goal: Adequate nutrition will be maintained Outcome: Completed/Met   Problem: Coping: Goal: Level of anxiety will decrease Outcome: Completed/Met   Problem: Elimination: Goal: Will not experience complications related to bowel motility Outcome: Completed/Met Goal: Will not experience complications related to urinary retention Outcome: Completed/Met   Problem: Pain Managment: Goal: General experience of comfort will improve Outcome: Completed/Met   Problem: Safety: Goal: Ability to remain free from injury will improve Outcome: Completed/Met   Problem: Skin Integrity: Goal: Risk for impaired skin integrity will decrease Outcome: Completed/Met   Problem: Education: Goal: Knowledge of General Education information will improve Description: Including pain rating scale, medication(s)/side effects and non-pharmacologic comfort measures Outcome: Completed/Met   Problem: Health Behavior/Discharge Planning: Goal: Ability to manage health-related needs will improve Outcome: Completed/Met   Problem: Clinical Measurements: Goal: Ability to maintain clinical measurements within normal limits will improve Outcome: Completed/Met Goal: Will remain free from infection Outcome: Completed/Met Goal: Diagnostic test results will  improve Outcome: Completed/Met Goal: Respiratory complications will improve Outcome: Completed/Met Goal: Cardiovascular complication will be avoided Outcome: Completed/Met   Problem: Activity: Goal: Risk for activity intolerance will decrease Outcome: Completed/Met   Problem: Nutrition: Goal: Adequate nutrition will be maintained Outcome: Completed/Met   Problem: Coping: Goal: Level of anxiety will decrease Outcome: Completed/Met   Problem: Elimination: Goal: Will not experience complications related to bowel motility Outcome: Completed/Met Goal: Will not experience complications related to urinary retention Outcome: Completed/Met   Problem: Pain Managment: Goal: General experience of comfort will improve Outcome: Completed/Met   Problem: Safety: Goal: Ability to remain free from injury will improve Outcome: Completed/Met   Problem: Skin Integrity: Goal: Risk for impaired skin integrity will decrease Outcome: Completed/Met

## 2019-03-30 NOTE — TOC Progression Note (Signed)
Transition of Care [facility] - Progression Note    Patient Details  Name: Natasha Hernandez MRN: [id number] Date of Birth: February 14, 1941  [facility] of [facility] CM/SW Contact  Purcell Mouton, RN Phone Number: 03/30/2019, 1:05 PM  Clinical Narrative:     Pt will discharge home with [facility] needs. Pt lives with adult children.        Expected Discharge Plan and Services           Expected Discharge Date: 03/30/19                                     Social Determinants of Health (SDOH) Interventions    Readmission Risk Interventions No flowsheet data found.

## 2019-03-30 NOTE — Discharge Instructions (Addendum)

## 2019-03-30 NOTE — Discharge Summary (Signed)
Physician Discharge Summary  Natasha Hernandez NWG:[id number] DOB: 17-Mar-1941 DOA: 03/26/2019  PCP: Salley Scarlet, MD  Admit date: 03/26/2019 Discharge date: 03/30/2019  Admitted From: Home Disposition: Home  Recommendations for Outpatient Follow-up:  1. Follow ups as below. 2. Please obtain CBC/BMP/Mag at follow up 3. Please follow up on the following pending results: None  Home Health: None Equipment/Devices: None  Discharge Condition: Stable CODE STATUS: Full code  Follow-up Information    [place], Velna Hatchet, MD. Schedule an appointment as soon as possible for a visit in 2 week(s).   Specialty: Family Medicine Contact information: [address] [place] [address] [phone]        Iran Ouch, MD Follow up in 2 week(s).   Specialty: Cardiology Contact information: [address] [place] [address] [place] [address] [phone]           Hospital Course: 78 year old female with history of DM-1 on insulin pump, HTN, HLD, PAD and CAD presenting with nausea, vomiting, fever, chills and mild nonproductive cough and generalized weakness, and admitted for COVID-19 pneumonia.  In ED, COVID-19 test positive.  CXR with RML and RLL patchy airspace opacity concerning for viral pneumonia.  Hemodynamically stable.  Saturating at 97% on RA.  Sodium 130.  CRP 7.1.  Ferritin 301.  LA 1.0.  D-dimer 1.1.  Fibrinogen 459.  Admitted for COVID-19 pneumonia and started on Decadron and remdesivir.   Patient continued on Decadron and remdesivir.  Symptoms and inflammatory markers improved.  On the day of discharge, she felt well and ready to go home.  Patient and family counseled on infection and return precautions.  See individual problem list below for more on hospital course.  Discharge Diagnoses:  COVID-19 pneumonia: On room air.  Elevated CRP and CXR concerning for COVID-19 infection. COVID-19 Labs  Recent Labs    03/28/19 0401 03/29/19 0437  03/30/19 0403  DDIMER 0.81* 0.73* 0.63*  FERRITIN 286 273 282  CRP 5.1* 2.5* 1.4*  -Symptoms and inflammatory markers improved -Remdesivir and Decadron per protocol 12/8>> 12/11  Uncontrolled DM-1 with hyperglycemia: Hemoglobin A1c 8.2%.  On insulin pump at home.  Hyperglycemia likely due to steroid. Recent Labs    03/30/19 0754 03/30/19 0827 03/30/19 1229  GLUCAP 68* 122* 174*  -Discharged on home insulin pump and statin  Nausea/vomiting: Resolved.  Likely due to the above.  Hyponatremia: Resolved.  History of CAD/PAD: Stable. -Continue Coreg, Imdur, lisinopril, aspirin, statin and Zetia.  Essential hypertension: BP mildly elevated. -Continue Coreg, Imdur and lisinopril.  -Consider adjusting antihypertensive if remains elevated after acute illness.  Constipation: Resolved  Discharge Instructions  Discharge Instructions    Call MD for:  difficulty breathing, headache or visual disturbances   Complete by: As directed    Call MD for:  extreme fatigue   Complete by: As directed    Call MD for:  persistant nausea and vomiting   Complete by: As directed    Diet - low sodium heart healthy   Complete by: As directed    Diet Carb Modified   Complete by: As directed    Increase activity slowly   Complete by: As directed    MyChart COVID-19 home monitoring program   Complete by: Mar 30, 2019    Is the patient willing to use the MyChart Mobile App for home monitoring?: Yes   Temperature monitoring   Complete by: Mar 30, 2019    After how many days would you like to receive  a notification of this patient's flowsheet entries?: 1     Allergies as of 03/30/2019   No Known Allergies     Medication List    TAKE these medications   aspirin EC 81 MG tablet Take 1 tablet (81 mg total) by mouth daily.   CALCIUM MAGNESIUM PO Take 1 tablet by mouth daily.   carvedilol 6.25 MG tablet Commonly known as: COREG Take 1 tablet by mouth twice daily with meals.   CO Q 10  PO Take 1 capsule by mouth daily.   ezetimibe 10 MG tablet Commonly known as: ZETIA Take 1 tablet by mouth daily.   FreeStyle Libre Reader Devi 1 Device by Does not apply route every 30 (thirty) days.   FreeStyle Libre Sensor System Misc Inject 3 Devices into the skin every 14 (fourteen) days. Dx code- E10.65 NDC- [id number]   glucose blood test strip Commonly known as: FreeStyle Precision Neo Test Use Freestyle Precision Neo strips to check blood sugar 3 times daily. DX:E10.65   glucose blood test strip Use to monitor glucose levels 4 times daily   insulin lispro 100 UNIT/ML injection Commonly known as: HUMALOG Use a maximum of 40 units under the skin daily via omnipod insulin pump. DX:E10.65   isosorbide mononitrate 60 MG 24 hr tablet Commonly known as: IMDUR Take 1/2 tablets by mouth every morning, take 1 tablet by mouth every evening. What changed:   how much to take  how to take this  when to take this  additional instructions   lisinopril 10 MG tablet Commonly known as: ZESTRIL Take 1 tablet (10 mg total) by mouth daily. Take 1 tablet by mouth once daily. What changed: additional instructions   multivitamin tablet Take 1 tablet by mouth daily.   nitroGLYCERIN 0.4 MG/SPRAY spray Commonly known as: Nitrolingual Place 1 spray under the tongue every 5 (five) minutes x 3 doses as needed for chest pain (Call 911 if taking 3rd spray).   OmniPod Dash 5 Pack Pods Misc 1 each by Does not apply route every 3 (three) days. Use as directed.   rosuvastatin 40 MG tablet Commonly known as: CRESTOR Take 1 tablet by mouth daily.       Consultations:  None  Procedures/Studies:  2D Echo: None   DG Chest Port 1 View  Result Date: 03/26/2019 CLINICAL DATA:  Weakness EXAM: PORTABLE CHEST 1 VIEW COMPARISON:  04/15/2018 FINDINGS: Cardiac shadow is mildly enlarged but stable. Postsurgical changes are again seen. Aortic calcifications are noted. The lungs are well  aerated bilaterally. Patchy airspace opacities are noted in the mid and lower right lung field. No bony abnormality is seen. IMPRESSION: Patchy airspace opacities within the right mid and lower lung. Correlation with recently obtained COVID-19 testing is recommended. Electronically Signed   By: Inez Catalina M.D.   On: 03/26/2019 21:55       Discharge Exam: Vitals:   03/29/19 2041 03/30/19 0530  BP: (!) 144/71 (!) 170/69  Pulse: 75 69  Resp: 20 20  Temp: 97.6 F (36.4 C) 98 F (36.7 C)  SpO2: 96% 98%    GENERAL: No acute distress.  Appears well.  HEENT: MMM.  Vision and hearing grossly intact.  NECK: Supple.  No apparent JVD.  RESP:  No IWOB.  Fair air movement bilaterally CVS:  RRR. Heart sounds normal.  ABD/GI/GU: Bowel sounds present. Soft. Non tender.  MSK/EXT:  Moves extremities. No apparent deformity or edema.  SKIN: no apparent skin lesion or wound NEURO: Awake, alert  and oriented appropriately.  No apparent focal neuro deficit. PSYCH: Calm. Normal affect.    The results of significant diagnostics from this hospitalization (including imaging, microbiology, ancillary and laboratory) are listed below for reference.     Microbiology: Recent Results (from the past 240 hour(s))  Respiratory Panel by RT PCR (Flu A&B, Covid) - Nasopharyngeal Swab     Status: Abnormal   Collection Time: 03/26/19 10:34 PM   Specimen: Nasopharyngeal Swab  Result Value Ref Range Status   SARS Coronavirus 2 by RT PCR POSITIVE (A) NEGATIVE Final    Comment: RESULT CALLED TO, READ BACK BY AND VERIFIED WITH: BOWEN,MIKE @ 0756 03/27/2019 PERRY, J. (NOTE) SARS-CoV-2 target nucleic acids are DETECTED. SARS-CoV-2 RNA is generally detectable in upper respiratory specimens  during the acute phase of infection. Positive results are indicative of the presence of the identified virus, but do not rule out bacterial infection or co-infection with other pathogens not detected by the test. Clinical  correlation with patient history and other diagnostic information is necessary to determine patient infection status. The expected result is Negative. Fact Sheet for Patients:  https://www.moore.com/ Fact Sheet for Healthcare Providers: https://www.young.biz/ This test is not yet approved or cleared by the [place] FDA and  has been authorized for detection and/or diagnosis of SARS-CoV-2 by FDA under an Emergency Use Authorization (EUA).  This EUA will remain in effect (meaning this test can be used)  for the duration of  the COVID-19 declaration under Section 564(b)(1) of the Act, 21 U.S.C. section 360bbb-3(b)(1), unless the authorization is terminated or revoked sooner.    Influenza A by PCR NEGATIVE NEGATIVE Final   Influenza B by PCR NEGATIVE NEGATIVE Final    Comment: (NOTE) The Xpert Xpress SARS-CoV-2/FLU/RSV assay is intended as an aid in  the diagnosis of influenza from Nasopharyngeal swab specimens and  should not be used as a sole basis for treatment. Nasal washings and  aspirates are unacceptable for Xpert Xpress SARS-CoV-2/FLU/RSV  testing. Fact Sheet for Patients: https://www.moore.com/ Fact Sheet for Healthcare Providers: https://www.young.biz/ This test is not yet approved or cleared by the [place] FDA and  has been authorized for detection and/or diagnosis of SARS-CoV-2 by  FDA under an Emergency Use Authorization (EUA). This EUA will remain  in effect (meaning this test can be used) for the duration of the  Covid-19 declaration under Section 564(b)(1) of the Act, 21  U.S.C. section 360bbb-3(b)(1), unless the authorization is  terminated or revoked. Performed at [facility], [address], [place], [place] [address]      Labs: BNP (last 3 results) No results for input(s): BNP in the last 8760 hours. Basic Metabolic Panel: Recent Labs  Lab  03/26/19 2135 03/27/19 0202 03/28/19 0401 03/29/19 0437 03/30/19 0403  NA 130*  --  133* 135 135  K 4.0  --  4.2 4.1 4.0  CL 90*  --  97* 97* 99  CO2 29  --  GLUCOSE 129*  --  227* 212* 122*  BUN 17  --  24* 26* 27*  CREATININE 0.63 0.54 0.68 0.70 0.64  CALCIUM 9.0  --  8.6* 8.7* 8.4*   Liver Function Tests: Recent Labs  Lab 03/26/19 2135 03/28/19 0401 03/29/19 0437 03/30/19 0403  AST 33  ALT 34  ALKPHOS 106 93 90 87  BILITOT 0.7 0.4 0.6 0.4  PROT 6.5 5.7* 5.9* 5.7*  ALBUMIN 3.5 3.0* 3.1* 3.0*  Recent Labs  Lab 03/26/19 2135  LIPASE 22   No results for input(s): AMMONIA in the last 168 hours. CBC: Recent Labs  Lab 03/26/19 2135 03/27/19 0202 03/28/19 0401 03/29/19 0437 03/30/19 0403  WBC 5.1 3.8* 5.4 6.6 8.1  NEUTROABS 4.4  --  4.9 5.8 7.1  HGB 13.4 11.9* 12.8 12.7 12.1  HCT 40.6 36.2 36.4 37.9 35.4*  MCV 100.7* 102.0* 101.7* 101.1* 103.2*  PLT 179 149* 209 248 264   Cardiac Enzymes: No results for input(s): CKTOTAL, CKMB, CKMBINDEX, TROPONINI in the last 168 hours. BNP: Invalid input(s): POCBNP CBG: Recent Labs  Lab 03/29/19 2043 03/30/19 0150 03/30/19 0754 03/30/19 0827 03/30/19 1229  GLUCAP 281* 147* 68* 122* 174*   D-Dimer Recent Labs    03/29/19 0437 03/30/19 0403  DDIMER 0.73* 0.63*   Hgb A1c Recent Labs    03/30/19 0403  HGBA1C 8.2*   Lipid Profile No results for input(s): CHOL, HDL, LDLCALC, TRIG, CHOLHDL, LDLDIRECT in the last 72 hours. Thyroid function studies No results for input(s): TSH, T4TOTAL, T3FREE, THYROIDAB in the last 72 hours.  Invalid input(s): FREET3 Anemia work up Recent Labs    03/29/19 0437 03/30/19 0403  FERRITIN 273 282   Urinalysis    Component Value Date/Time   COLORURINE YELLOW 08/24/2018 0941   APPEARANCEUR CLOUDY (A) 08/24/2018 0941   LABSPEC 1.020 08/24/2018 0941   PHURINE 6.0 08/24/2018 0941   GLUCOSEU NEGATIVE 08/24/2018 0941   GLUCOSEU 100 (A) 05/04/2016  1535   HGBUR 2+ (A) 08/24/2018 0941   BILIRUBINUR NEGATIVE 05/04/2016 1535   KETONESUR NEGATIVE 08/24/2018 0941   PROTEINUR 2+ (A) 08/24/2018 0941   UROBILINOGEN 0.2 05/04/2016 1535   NITRITE NEGATIVE 08/24/2018 0941   LEUKOCYTESUR 3+ (A) 08/24/2018 0941   Sepsis Labs Invalid input(s): PROCALCITONIN,  WBC,  LACTICIDVEN   Time coordinating discharge: 35 minutes  SIGNED:  Almon Herculesaye T Jannet Calip, MD  [facility] 03/30/2019, 8:55 PM  If 7PM-7AM, please contact night-coverage [email or web address] Password [email or web address]

## 2019-03-30 NOTE — Progress Notes (Signed)
Hypoglycemic Event  CBG: 68  Treatment: 4 oz juice/soda  Symptoms: None  Follow-up CBG: Time: 0827 CBG Result:122  Possible Reasons for Event: Unknown  Comments/MD notified: Dr. Orbie Hurst, [place] Found

## 2019-04-05 ENCOUNTER — Other Ambulatory Visit: Payer: Self-pay

## 2019-04-05 ENCOUNTER — Encounter (HOSPITAL_COMMUNITY): Payer: Self-pay | Admitting: Emergency Medicine

## 2019-04-05 ENCOUNTER — Emergency Department (HOSPITAL_COMMUNITY): Payer: Medicare Other

## 2019-04-05 ENCOUNTER — Emergency Department (HOSPITAL_COMMUNITY)
Admission: EM | Admit: 2019-04-05 | Discharge: 2019-04-06 | Disposition: A | Discharge status: Home / Self Care | Payer: Medicare Other | Attending: Emergency Medicine | Admitting: Emergency Medicine

## 2019-04-05 ENCOUNTER — Other Ambulatory Visit: Payer: Self-pay | Admitting: Endocrinology

## 2019-04-05 ENCOUNTER — Telehealth: Payer: Self-pay | Admitting: Endocrinology

## 2019-04-05 DIAGNOSIS — Z7982 Long term (current) use of aspirin: Secondary | ICD-10-CM | POA: Diagnosis not present

## 2019-04-05 DIAGNOSIS — R0602 Shortness of breath: Secondary | ICD-10-CM | POA: Diagnosis present

## 2019-04-05 DIAGNOSIS — Z951 Presence of aortocoronary bypass graft: Secondary | ICD-10-CM | POA: Insufficient documentation

## 2019-04-05 DIAGNOSIS — U071 COVID-19: Secondary | ICD-10-CM

## 2019-04-05 DIAGNOSIS — E109 Type 1 diabetes mellitus without complications: Secondary | ICD-10-CM | POA: Diagnosis not present

## 2019-04-05 DIAGNOSIS — Z79899 Other long term (current) drug therapy: Secondary | ICD-10-CM | POA: Diagnosis not present

## 2019-04-05 DIAGNOSIS — Z794 Long term (current) use of insulin: Secondary | ICD-10-CM | POA: Diagnosis not present

## 2019-04-05 DIAGNOSIS — I2510 Atherosclerotic heart disease of native coronary artery without angina pectoris: Secondary | ICD-10-CM | POA: Insufficient documentation

## 2019-04-05 DIAGNOSIS — R410 Disorientation, unspecified: Secondary | ICD-10-CM | POA: Diagnosis not present

## 2019-04-05 DIAGNOSIS — I6789 Other cerebrovascular disease: Secondary | ICD-10-CM | POA: Diagnosis not present

## 2019-04-05 LAB — URINALYSIS, ROUTINE W REFLEX MICROSCOPIC
Bilirubin Urine: NEGATIVE
Glucose, UA: 150 mg/dL — AB
Hgb urine dipstick: NEGATIVE
Ketones, ur: NEGATIVE mg/dL
Leukocytes,Ua: NEGATIVE
Nitrite: NEGATIVE
Protein, ur: NEGATIVE mg/dL
Specific Gravity, Urine: 1.026 (ref 1.005–1.030)
pH: 8 (ref 5.0–8.0)

## 2019-04-05 LAB — CBC WITH DIFFERENTIAL/PLATELET
Abs Immature Granulocytes: 0.04 10*3/uL (ref 0.00–0.07)
Basophils Absolute: 0 10*3/uL (ref 0.0–0.1)
Basophils Relative: 0 %
Eosinophils Absolute: 0.1 10*3/uL (ref 0.0–0.5)
Eosinophils Relative: 1 %
HCT: 38.2 % (ref 36.0–46.0)
Hemoglobin: 12.6 g/dL (ref 12.0–15.0)
Immature Granulocytes: 1 %
Lymphocytes Relative: 10 %
Lymphs Abs: 0.7 10*3/uL (ref 0.7–4.0)
MCH: 33.5 pg (ref 26.0–34.0)
MCHC: 33 g/dL (ref 30.0–36.0)
MCV: 101.6 fL — ABNORMAL HIGH (ref 80.0–100.0)
Monocytes Absolute: 0.5 10*3/uL (ref 0.1–1.0)
Monocytes Relative: 7 %
Neutro Abs: 5.7 10*3/uL (ref 1.7–7.7)
Neutrophils Relative %: 81 %
Platelets: 210 10*3/uL (ref 150–400)
RBC: 3.76 MIL/uL — ABNORMAL LOW (ref 3.87–5.11)
RDW: 13.3 % (ref 11.5–15.5)
WBC: 7 10*3/uL (ref 4.0–10.5)
nRBC: 0 % (ref 0.0–0.2)

## 2019-04-05 LAB — COMPREHENSIVE METABOLIC PANEL
ALT: 29 U/L (ref 0–44)
AST: 24 U/L (ref 15–41)
Albumin: 2.8 g/dL — ABNORMAL LOW (ref 3.5–5.0)
Alkaline Phosphatase: 84 U/L (ref 38–126)
Anion gap: 9 (ref 5–15)
BUN: 24 mg/dL — ABNORMAL HIGH (ref 8–23)
CO2: 25 mmol/L (ref 22–32)
Calcium: 8.2 mg/dL — ABNORMAL LOW (ref 8.9–10.3)
Chloride: 100 mmol/L (ref 98–111)
Creatinine, Ser: 0.93 mg/dL (ref 0.44–1.00)
GFR calc Af Amer: >60 mL/min (ref >60)
GFR calc non Af Amer: 59 mL/min — ABNORMAL LOW (ref >60)
Glucose, Bld: 355 mg/dL — ABNORMAL HIGH (ref 70–99)
Potassium: 4.5 mmol/L (ref 3.5–5.1)
Sodium: 134 mmol/L — ABNORMAL LOW (ref 135–145)
Total Bilirubin: 0.5 mg/dL (ref 0.3–1.2)
Total Protein: 5.3 g/dL — ABNORMAL LOW (ref 6.5–8.1)

## 2019-04-05 LAB — LACTATE DEHYDROGENASE: LDH: 233 U/L — ABNORMAL HIGH (ref 98–192)

## 2019-04-05 LAB — C-REACTIVE PROTEIN: CRP: 0.6 mg/dL (ref <1.0)

## 2019-04-05 LAB — PROCALCITONIN: Procalcitonin: <0.1 ng/mL

## 2019-04-05 LAB — TRIGLYCERIDES: Triglycerides: 46 mg/dL (ref <150)

## 2019-04-05 LAB — FERRITIN: Ferritin: 217 ng/mL (ref 11–307)

## 2019-04-05 LAB — D-DIMER, QUANTITATIVE: D-Dimer, Quant: 0.72 ug/mL-FEU — ABNORMAL HIGH (ref 0.00–0.50)

## 2019-04-05 LAB — LACTIC ACID, PLASMA: Lactic Acid, Venous: 1.4 mmol/L (ref 0.5–1.9)

## 2019-04-05 LAB — FIBRINOGEN: Fibrinogen: 417 mg/dL (ref 210–475)

## 2019-04-05 LAB — MAGNESIUM: Magnesium: 2 mg/dL (ref 1.7–2.4)

## 2019-04-05 LAB — CBG MONITORING, ED: Glucose-Capillary: 208 mg/dL — ABNORMAL HIGH (ref 70–99)

## 2019-04-05 MED ORDER — HUMALOG 100 UNIT/ML ~~LOC~~ SOLN: SUBCUTANEOUS | 3 refills | Status: DC

## 2019-04-05 MED ORDER — IOHEXOL 350 MG/ML SOLN
100.0000 mL | Freq: Once | INTRAVENOUS | Status: AC | PRN
  Administered 2019-04-05: 100 mL via INTRAVENOUS

## 2019-04-05 NOTE — ED Notes (Signed)
Off the floor for imaging

## 2019-04-05 NOTE — ED Notes (Signed)
Back on the Floor

## 2019-04-05 NOTE — ED Notes (Signed)
Sent urine culture with urine specimen ?

## 2019-04-05 NOTE — Telephone Encounter (Signed)
This has already been done.

## 2019-04-05 NOTE — ED Triage Notes (Signed)
Pt recently admitted for COVID, family reports since discharge from hospital pt has complained of increased shortness of breath, intermittent chest pain, fatigue, nausea and vomiting. Pt hard of hearing, answering questions appropriately. Hx diabetes, blood sugars running high.

## 2019-04-05 NOTE — ED Notes (Signed)
Pt. Is still confused, she is oriented to herself and location but not month. She stated it was Christmas but she didn't know what month it was. GCS completed. Will continue to monitor

## 2019-04-05 NOTE — Telephone Encounter (Signed)
Pharmacy at [facility] called to advise that the prescriptions they received today need to have the ICD-10 (DX) codes on them so that they can bill through Medicare Part B.  Pharmacist requested a resend

## 2019-04-05 NOTE — ED Notes (Signed)
[place] wants an update states mother is having difficulty understanding

## 2019-04-05 NOTE — ED Provider Notes (Signed)
[facility] EMERGENCY DEPARTMENT Provider Note   CSN: [id number] Arrival date & time: 04/05/19  1722     History Chief Complaint  Patient presents with  . Shortness of Breath    Natasha Hernandez is a 78 y.o. female.  Pt presents to the ED today with sob and weakness.  Pt was diagnosed with covid on 12/7.  She was admitted to the hospital from 12/7-11.  Since d/c, she has not been feeling any better.  She can't catch her breath and has a hard time walking.  She has diabetes and her blood sugar has been running high.  She lives alone, but her family checks on her frequently.  Pt's family also said she's been more confused than normal.  She has been sleeping a lot.         Past Medical History:  Diagnosis Date  . Carotid artery occlusion   . Coronary artery disease    CABG in 1996. Most recent cardiac catheterization in 2013 showed patent grafts including LIMA to LAD, SVG to OM, SVG to diagonal and SVG to PDA.  . Diabetes mellitus without complication (HCC)   . Hyperlipidemia   . Hypertension   . PAD (peripheral artery disease) [facility])     Patient Active Problem List   Diagnosis Date Noted  . Pneumonia due to COVID-19 virus 03/27/2019  . Onychomycosis of multiple toenails with type 2 diabetes mellitus and peripheral neuropathy (HCC) 09/29/2018  . Pre-ulcerative calluses 09/29/2018  . Type 1 diabetes mellitus (HCC) 06/02/2018  . Degeneration of lumbar intervertebral disc 05/31/2018  . Scoliosis deformity of spine 05/31/2018  . Chest pain, rule out acute myocardial infarction 04/16/2018  . AKI (acute kidney injury) (HCC) 04/16/2018  . CAD (coronary artery disease) 09/27/2016  . Hx of CABG 09/27/2016  . Right carotid artery occlusion 09/27/2016  . PAD (peripheral artery disease) (HCC) 09/27/2016  . Essential hypertension 07/10/2016  . Uncontrolled type 1 diabetes mellitus with hyperglycemia (HCC) 05/07/2016  . Pure hypercholesterolemia 05/07/2016  .  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) 05/07/2016  . Pain in finger of right hand 04/13/2016  . Stiffness of finger joint of right hand 04/13/2016  . Stiffness of right wrist joint 04/13/2016  . Closed fracture of right distal radius 04/08/2016    Past Surgical History:  Procedure Laterality Date  . CORONARY ARTERY BYPASS GRAFT  1996     OB History   No obstetric history on file.     Family History  Problem Relation Age of Onset  . Breast cancer Mother 67  . Stroke Mother   . Emphysema Father 46  . Heart attack Father     Social History   Tobacco Use  . Smoking status: Never Smoker  . Smokeless tobacco: Never Used  Substance Use Topics  . Alcohol use: No  . Drug use: Not on file    Home Medications Prior to Admission medications   Medication Sig Start Date End Date Taking? Authorizing Provider  aspirin EC 81 MG tablet Take 1 tablet (81 mg total) by mouth daily. 03/25/17   Iran Ouch, MD  Calcium-Magnesium-Vitamin D (CALCIUM MAGNESIUM PO) Take 1 tablet by mouth daily.    [provider]  carvedilol (COREG) 6.25 MG tablet Take 1 tablet by mouth twice daily with meals. 01/22/19   Reather Littler, MD  Coenzyme Q10 (CO Q 10 PO) Take 1 capsule by mouth daily.    [provider]  Continuous  Blood Gluc Receiver (FREESTYLE LIBRE READER) DEVI 1 Device by Does not apply route every 30 (thirty) days. 03/30/17   Reather Littler, MD  Continuous Blood Gluc Sensor (FREESTYLE LIBRE SENSOR SYSTEM) MISC Inject 3 Devices into the skin every 14 (fourteen) days. Dx code- E10.65 NDC- [id number] 05/11/16   Reather Littler, MD  ezetimibe (ZETIA) 10 MG tablet Take 1 tablet by mouth daily. 12/24/18   Reather Littler, MD  glucose blood (FREESTYLE PRECISION NEO TEST) test strip Use Freestyle Precision Neo strips to check blood sugar 3 times daily. DX:E10.65 08/16/18   Reather Littler, MD  glucose blood test strip Use to monitor glucose levels 4 times daily  01/05/19   Romero Belling, MD  HUMALOG 100 UNIT/ML injection USE A MAXIMUM OF 40 UNITS UNDER THE SKIN DAILY VIA OMNIPOD INSULIN PUMP. DX:E10.65 04/05/19   Reather Littler, MD  Insulin Disposable Pump (OMNIPOD DASH 5 PACK) MISC 1 each by Does not apply route every 3 (three) days. Use as directed. 04/06/18   Reather Littler, MD  isosorbide mononitrate (IMDUR) 60 MG 24 hr tablet Take 1/2 tablets by mouth every morning, take 1 tablet by mouth every evening. Patient taking differently: Take 60 mg by mouth daily.  03/23/19   Iran Ouch, MD  lisinopril (ZESTRIL) 10 MG tablet Take 1 tablet (10 mg total) by mouth daily. Take 1 tablet by mouth once daily. Patient taking differently: Take 10 mg by mouth daily.  12/08/18   Reather Littler, MD  Multiple Vitamin (MULTIVITAMIN) tablet Take 1 tablet by mouth daily.    [provider]  nitroGLYCERIN (NITROLINGUAL) 0.4 MG/SPRAY spray Place 1 spray under the tongue every 5 (five) minutes x 3 doses as needed for chest pain (Call 911 if taking 3rd spray). 01/27/16   Iran Ouch, MD  rosuvastatin (CRESTOR) 40 MG tablet Take 1 tablet by mouth daily. 12/24/18   Reather Littler, MD    Allergies    Patient has no known allergies.  Review of Systems   Review of Systems  Respiratory: Positive for shortness of breath.   Neurological: Positive for weakness.  All other systems reviewed and are negative.   Physical Exam Updated Vital Signs BP (!) 173/83   Pulse 80   Temp 99.4 F (37.4 C) (Oral)   Resp 17   Ht 5\' 5"  (1.651 m)   Wt 69.9 kg   SpO2 99%   BMI 25.63 kg/m   Physical Exam Vitals and nursing note reviewed.  Constitutional:      Appearance: She is well-developed.  HENT:     Head: Normocephalic and atraumatic.     Mouth/Throat:     Mouth: Mucous membranes are moist.     Pharynx: Oropharynx is clear.  Eyes:     Extraocular Movements: Extraocular movements intact.     Pupils: Pupils are equal, round, and reactive to light.  Cardiovascular:      Rate and Rhythm: Normal rate and regular rhythm.  Pulmonary:     Effort: Pulmonary effort is normal.     Breath sounds: Normal breath sounds.  Abdominal:     General: Bowel sounds are normal.     Palpations: Abdomen is soft.  Musculoskeletal:        General: Normal range of motion.     Cervical back: Normal range of motion and neck supple.  Skin:    General: Skin is warm and dry.     Capillary Refill: Capillary refill takes less than 2 seconds.  Neurological:  General: No focal deficit present.     Mental Status: She is alert and oriented to person, place, and time.  Psychiatric:        Mood and Affect: Mood normal.        Behavior: Behavior normal.     ED Results / Procedures / Treatments   Labs (all labs ordered are listed, but only abnormal results are displayed) Labs Reviewed  CBC WITH DIFFERENTIAL/PLATELET - Abnormal; Notable for the following components:      Result Value   RBC 3.76 (*)    MCV 101.6 (*)    All other components within normal limits  COMPREHENSIVE METABOLIC PANEL - Abnormal; Notable for the following components:   Sodium 134 (*)    Glucose, Bld 355 (*)    BUN 24 (*)    Calcium 8.2 (*)    Total Protein 5.3 (*)    Albumin 2.8 (*)    GFR calc non Af Amer 59 (*)    All other components within normal limits  D-DIMER, QUANTITATIVE (NOT AT [facility]) - Abnormal; Notable for the following components:   D-Dimer, Quant 0.72 (*)    All other components within normal limits  LACTATE DEHYDROGENASE - Abnormal; Notable for the following components:   LDH 233 (*)    All other components within normal limits  URINALYSIS, ROUTINE W REFLEX MICROSCOPIC - Abnormal; Notable for the following components:   APPearance HAZY (*)    Glucose, UA 150 (*)    All other components within normal limits  CBG MONITORING, ED - Abnormal; Notable for the following components:   Glucose-Capillary 208 (*)    All other components within normal limits  CULTURE, BLOOD (ROUTINE X 2)   CULTURE, BLOOD (ROUTINE X 2)  LACTIC ACID, PLASMA  PROCALCITONIN  FERRITIN  TRIGLYCERIDES  FIBRINOGEN  C-REACTIVE PROTEIN  MAGNESIUM  LACTIC ACID, PLASMA    EKG EKG Interpretation  Date/Time:  Thursday April 05 2019 17:36:58 EST Ventricular Rate:  81 PR Interval:  158 QRS Duration: 60 QT Interval:  380 QTC Calculation: 441 R Axis:   14 Text Interpretation: Normal sinus rhythm with sinus arrhythmia Low voltage QRS Cannot rule out Anterior infarct , age undetermined T wave abnormality, consider lateral ischemia Abnormal ECG No significant change since last tracing Confirmed by Jacalyn LefevreHaviland, Fabrizzio Marcella [phone]([id number]) on 04/05/2019 5:58:27 PM   Radiology CT Angio Chest PE W and/or Wo Contrast  Result Date: 04/05/2019 CLINICAL DATA:  Recent admission for COVID. Shortness of breath, chest pain EXAM: CT ANGIOGRAPHY CHEST WITH CONTRAST TECHNIQUE: Multidetector CT imaging of the chest was performed using the standard protocol during bolus administration of intravenous contrast. Multiplanar CT image reconstructions and MIPs were obtained to evaluate the vascular anatomy. CONTRAST:  100mL OMNIPAQUE IOHEXOL 350 MG/ML SOLN COMPARISON:  Chest x-ray today FINDINGS: Cardiovascular: No filling defects in the pulmonary arteries to suggest pulmonary emboli. Extensive coronary artery calcifications. Scattered aortic calcifications. Heart is normal size. Aorta is normal caliber. Mediastinum/Nodes: No mediastinal, hilar, or axillary adenopathy. Trachea and esophagus are unremarkable. Thyroid unremarkable. Lungs/Pleura: Minimal/vague ground-glass densities peripherally in the right upper lobe and right lower lobe. Left lung clear. No effusions. Dependent atelectasis. Upper Abdomen: Imaging into the upper abdomen shows no acute findings. Calcifications in the liver and spleen compatible with old granulomatous disease. Musculoskeletal: No acute bony abnormality. Review of the MIP images confirms the above findings.  IMPRESSION: No evidence of pulmonary embolus. Dependent atelectasis in the lower lobes. Minimal ground-glass densities in the right upper and lower  lobe which could reflect early infiltrates. Extensive coronary artery disease. Aortic Atherosclerosis (ICD10-I70.0). Electronically Signed   By: Rolm Baptise M.D.   On: 04/05/2019 21:49   DG Chest Port 1 View  Result Date: 04/05/2019 CLINICAL DATA:  Shortness of breath EXAM: PORTABLE CHEST 1 VIEW COMPARISON:  03/26/2019 FINDINGS: Prior CABG. Heart is upper limits normal in size. Lungs clear. No effusions or edema. No acute bony abnormality. IMPRESSION: No active disease. Electronically Signed   By: Rolm Baptise M.D.   On: 04/05/2019 18:55    Procedures Procedures (including critical care time)  Medications Ordered in ED Medications  iohexol (OMNIPAQUE) 350 MG/ML injection 100 mL (100 mLs Intravenous Contrast Given 04/05/19 2141)    ED Course  I have reviewed the triage vital signs and the nursing notes.  Pertinent labs & imaging results that were available during my care of the patient were reviewed by me and considered in my medical decision making (see chart for details).    MDM Rules/Calculators/A&P                      Due to confusion, a MRI has been ordered.  Pt's family does not want her to go to a rehab facility.  They are interested in home health.  So, if MRI is nl, they will take her back home.  I did order home health.  Pt's oxygenation has been good while here.  CTA was negative for PE.  She does have some minimal infiltrate, but she had that on admission.  MRI is pending at shift change.  Pt signed out to Dr. Randal Buba.   Willella Shaunessy Dobratz was evaluated in Emergency Department on 04/05/2019 for the symptoms described in the history of present illness. She was evaluated in the context of the global COVID-19 pandemic, which necessitated consideration that the patient might be at risk for infection with the SARS-CoV-2 virus  that causes COVID-19. Institutional protocols and algorithms that pertain to the evaluation of patients at risk for COVID-19 are in a state of rapid change based on information released by regulatory bodies including the CDC and federal and state organizations. These policies and algorithms were followed during the patient's care in the ED.   Final Clinical Impression(s) / ED Diagnoses Final diagnoses:  COVID-19 virus infection    Rx / DC Orders ED Discharge Orders    None       Isla Pence, MD 04/05/19 2318

## 2019-04-06 ENCOUNTER — Emergency Department (HOSPITAL_COMMUNITY): Payer: Medicare Other

## 2019-04-06 ENCOUNTER — Telehealth: Payer: Medicare Other | Admitting: Family Medicine

## 2019-04-06 DIAGNOSIS — I6789 Other cerebrovascular disease: Secondary | ICD-10-CM | POA: Diagnosis not present

## 2019-04-06 DIAGNOSIS — U071 COVID-19: Secondary | ICD-10-CM | POA: Diagnosis not present

## 2019-04-06 LAB — LACTIC ACID, PLASMA: Lactic Acid, Venous: 1.7 mmol/L (ref 0.5–1.9)

## 2019-04-06 MED ORDER — GADOBUTROL 1 MMOL/ML IV SOLN
7.0000 mL | Freq: Once | INTRAVENOUS | Status: AC | PRN
  Administered 2019-04-06: 7 mL via INTRAVENOUS

## 2019-04-06 NOTE — ED Notes (Signed)
Attempted to contact MRI, no answer then had Secretary attempt same with same response

## 2019-04-06 NOTE — ED Notes (Signed)
Made contact with [facility] they stated were busy with another Pt. And would call when ready

## 2019-04-06 NOTE — ED Notes (Signed)
17 back from [facility]

## 2019-04-06 NOTE — ED Notes (Signed)
Off the floor to Mri approx 0250 hrs

## 2019-04-06 NOTE — ED Notes (Signed)
Gave Pt. A diet gingeer ale a fewe crackers and cheese. Pt.thanked me for the food

## 2019-04-06 NOTE — ED Notes (Signed)
Pt. Is resting peacefully. I can observe her chest rise and fall as shne breathes. Vitals are WDL

## 2019-04-06 NOTE — ED Notes (Signed)
Visually checked on Pt. And saw monitor was off. Entered room and reconnected leadds and took new vitals signs. Pt stated she was cold and asked for another blanket which was prov ided. Pt. Had been asleep when I entered the room. Pt. Is oriented x 2 and continues to follow her baseline since her admission.

## 2019-04-06 NOTE — ED Notes (Signed)
Contacted [facility] after giving food to Pt. They stated that would be fine and not cause any problems. MD notified.

## 2019-04-09 ENCOUNTER — Other Ambulatory Visit: Payer: Self-pay

## 2019-04-09 ENCOUNTER — Telehealth: Payer: Self-pay

## 2019-04-09 ENCOUNTER — Other Ambulatory Visit: Payer: Self-pay | Admitting: Endocrinology

## 2019-04-09 MED ORDER — INSULIN ASPART 100 UNIT/ML ~~LOC~~ SOLN: SUBCUTANEOUS | 3 refills | Status: DC

## 2019-04-09 NOTE — Telephone Encounter (Signed)
Recevied fax from [employer] stating that the pt has been approved for Omnipod Dash 5 pack pods misc.  This approval is good from 01/09/2019 through 04/08/2020.

## 2019-04-09 NOTE — Telephone Encounter (Signed)
PA initiated via [email or web address] for Omnipod Dash pods.  Gabbi Albee KeyMarnee Spring - PA Case ID: [id number] - Rx #: [id number] Need help? Call us at [phone] Status Sent to [employer] OmniPod Dash 5 Pack Pods Form [facility] Medicare Electronic PA Form Original Claim Info [id number] NON-FORMULARY DRUG, CONTACT PRESCRIBER(PHARMACY HELP DESK [phone])

## 2019-04-10 LAB — CULTURE, BLOOD (ROUTINE X 2)
Culture: NO GROWTH
Special Requests: ADEQUATE

## 2019-04-11 LAB — CULTURE, BLOOD (ROUTINE X 2): Culture: NO GROWTH

## 2019-04-17 ENCOUNTER — Other Ambulatory Visit: Payer: Self-pay

## 2019-04-20 ENCOUNTER — Other Ambulatory Visit: Payer: Self-pay | Admitting: Endocrinology

## 2019-04-27 ENCOUNTER — Ambulatory Visit: Payer: Medicare Other | Admitting: Cardiovascular Disease

## 2019-05-02 ENCOUNTER — Other Ambulatory Visit: Payer: Self-pay | Admitting: Cardiovascular Disease

## 2019-05-14 ENCOUNTER — Other Ambulatory Visit (INDEPENDENT_AMBULATORY_CARE_PROVIDER_SITE_OTHER): Payer: Medicare Other

## 2019-05-14 ENCOUNTER — Other Ambulatory Visit: Payer: Self-pay

## 2019-05-14 DIAGNOSIS — E1065 Type 1 diabetes mellitus with hyperglycemia: Secondary | ICD-10-CM

## 2019-05-14 LAB — COMPREHENSIVE METABOLIC PANEL
ALT: 22 U/L (ref 0–35)
AST: 23 U/L (ref 0–37)
Albumin: 3.6 g/dL (ref 3.5–5.2)
Alkaline Phosphatase: 71 U/L (ref 39–117)
BUN: 25 mg/dL — ABNORMAL HIGH (ref 6–23)
CO2: 30 mEq/L (ref 19–32)
Calcium: 9.2 mg/dL (ref 8.4–10.5)
Chloride: 105 mEq/L (ref 96–112)
Creatinine, Ser: 0.76 mg/dL (ref 0.40–1.20)
GFR: 73.77 mL/min (ref >60.00)
Glucose, Bld: 136 mg/dL — ABNORMAL HIGH (ref 70–99)
Potassium: 4.3 mEq/L (ref 3.5–5.1)
Sodium: 140 mEq/L (ref 135–145)
Total Bilirubin: 0.5 mg/dL (ref 0.2–1.2)
Total Protein: 6.2 g/dL (ref 6.0–8.3)

## 2019-05-14 LAB — HEMOGLOBIN A1C: Hgb A1c MFr Bld: 7.8 % — ABNORMAL HIGH (ref 4.6–6.5)

## 2019-05-16 ENCOUNTER — Other Ambulatory Visit: Payer: Self-pay

## 2019-05-18 ENCOUNTER — Ambulatory Visit (INDEPENDENT_AMBULATORY_CARE_PROVIDER_SITE_OTHER): Payer: Medicare Other | Admitting: Endocrinology

## 2019-05-18 ENCOUNTER — Other Ambulatory Visit: Payer: Self-pay

## 2019-05-18 ENCOUNTER — Encounter: Payer: Self-pay | Admitting: Endocrinology

## 2019-05-18 VITALS — BP 120/58 | HR 90 | Ht 65.0 in | Wt 163.0 lb

## 2019-05-18 DIAGNOSIS — E1065 Type 1 diabetes mellitus with hyperglycemia: Secondary | ICD-10-CM

## 2019-05-18 DIAGNOSIS — I10 Essential (primary) hypertension: Secondary | ICD-10-CM

## 2019-05-18 DIAGNOSIS — E7800 Pure hypercholesterolemia, unspecified: Secondary | ICD-10-CM | POA: Diagnosis not present

## 2019-05-18 DIAGNOSIS — Z23 Encounter for immunization: Secondary | ICD-10-CM

## 2019-05-18 NOTE — Progress Notes (Signed)
Patient ID: Natasha Hernandez, female   DOB: [birth year], 79 y.o.   MRN: [id number]            Reason for Appointment : Follow-up for Type 1 Diabetes    History of Present Illness          Diagnosis: Type 1 diabetes mellitus, date of diagnosis: 1953          Previous history:  She thinks she has been on various insulin regimens over the years but has been on Lantus for 10-12 years along with mealtime insulin Previously followed by an endocrinologist but no records available. Her A1c has usually been around 7% reportedly and was 7.2 in 7/16   Recent history:    INSULIN PUMP settings BASAL rate midnight = 0.5, 5 AM = 0.65.  12 PM = 0.70.  6 PM = 0.6 and 9 PM =  0. 55 with total basal 14  Carbohydrate coverage 1: 6, sensitivity 1: 60 and active insulin 4 hours. Blood sugar target 135-140  A1c is 7.8 and has been generally around 8%  Current management, blood sugar patterns and problems identified:     She was last seen in 8/20  She has now started using the Dexcom instead of the freestyle libre  However she appears to be having mostly hyperglycemic patterns late in the evenings and overnight  She claims that if she is having near normal blood sugars at mealtime she cannot bolus until the blood sugar goes up after eating as the blood sugar may drop  Otherwise appears that most of her premeal blood sugars are high anyway  Postprandial hyperglycemia has been nonexistent recently but blood sugars are usually rising late in the evening  Has marked hyperglycemia in the last week after about midnight and not clear why  Glucose monitoring:  is being done several times a day         Glucometer: Dexcom, previously Freestyle libre   CONTINUOUS GLUCOSE MONITORING RECORD INTERPRETATION    Dates of Recording: 1/16 through 1/29  Sensor description: Dexcom G6  Results statistics:   CGM use % of time  99  Average and SD  182  Time in range      54%  % Time Above 180  46  %  Time above 250  9  % Time Below target  0    PRE-MEAL Fasting Lunch Dinner Bedtime Overall  Glucose range:       Mean/median:  167  175  169     POST-MEAL PC Breakfast PC Lunch PC Dinner  Glucose range:     Mean/median:  159  176  176     Glycemic patterns summary: Blood sugar patterns are fairly consistent with hyperglycemia starting around 8-9 PM and continuing until 6-7 AM.  Has more variability in the evenings after 6-7 PM No hypoglycemia  Hyperglycemic episodes usually related in the evenings after about 8 PM and more consistently between midnight-3 AM  Hypoglycemic episodes not present  Overnight periods: Blood sugars are rising fairly significantly after work 10 PM with some variability and more consistently high between midnight-4 AM and then gradually declining  Preprandial periods: Blood sugars are averaging as above with somewhat more variability at lunchtime and dinnertime and somewhat lower blood sugars in the mornings  Postprandial periods:   After breakfast: Blood sugars are relatively flat postprandially  After lunch: Blood sugars show only mild increase but with some variability After dinner: Blood sugars are increasing less than  10 mg on an average blood there is more variability with blood sugars as high as 266 postprandially  PREVIOUS data: Blood Glucose analysis and averages from freestyle libre  CGM use % of time  100  2-week average/SD  146, was 197  Time in range    82    %  % Time Above 180  16  % Time above 250  1  % Time Below 70  1     PRE-MEAL Fasting Lunch Dinner Bedtime Overall  Glucose range:  139-219      Averages:  138  138  129  150  146   POST-MEAL PC Breakfast PC Lunch PC Dinner  Glucose range:     Averages:  153  136  151     Factors causing Hypoglycemia: Overestimating mealtime coverage, excessive basal rate  Symptoms of hypoglycemia: Some weakness, confusion at times Treatment of hypoglycemia: Corn syrup or  jellybeans/other sweets.  She will get 15 g of carbohydrate  Self-care: The diet that the patient has been following is: Carbohydrate counting  Mealtimes are: Breakfast AT 8.30 AM Dinner: 7.30   For breakfast will have toast with cheese or egg, sometimes cereal and egg  Exercise: walking and that time will use her exercise bike        Dietician consultation: Most recent: Years ago .         CDE consultation: 04/2018   Wt Readings from Last 3 Encounters:  05/18/19 163 lb (73.9 kg)  04/05/19 154 lb (69.9 kg)  03/27/19 160 lb 4.4 oz (72.7 kg)    Lab Results  Component Value Date   HGBA1C 7.8 (H) 05/14/2019   HGBA1C 8.2 (H) 03/30/2019   HGBA1C 7.8 (H) 03/27/2019   Lab Results  Component Value Date   MICROALBUR 3.7 (H) 08/18/2018   LDLCALC 37 10/23/2018   CREATININE 0.76 05/14/2019    Lab Results  Component Value Date   MICRALBCREAT 4.5 08/18/2018   MICRALBCREAT 1.1 06/06/2017     Allergies as of 05/18/2019   No Known Allergies     Medication List       Accurate as of May 18, 2019 11:59 PM. If you have any questions, ask your nurse or doctor.        STOP taking these medications   FreeStyle Libre Reader Kerrin Mo Stopped by: Elayne Snare, MD   glucose blood test strip Stopped by: Elayne Snare, MD   insulin aspart 100 UNIT/ML injection Commonly known as: NovoLOG Stopped by: Elayne Snare, MD     TAKE these medications   aspirin EC 81 MG tablet Take 1 tablet (81 mg total) by mouth daily.   CALCIUM MAGNESIUM PO Take 1 tablet by mouth daily.   carvedilol 6.25 MG tablet Commonly known as: COREG Take 1 tablet by mouth twice daily with meals.   CO Q 10 PO Take 1 capsule by mouth daily.   Dexcom G6 Sensor Misc 1 each by Does not apply route See admin instructions. Use one sensor once every 10 days to monitor blood sugars. What changed: Another medication with the same name was removed. Continue taking this medication, and follow the directions you see here.  Changed by: Elayne Snare, MD   Dexcom G6 Transmitter Misc 1 each by Does not apply route See admin instructions. Use one transmitter once every 90 days to transmit blood sugars.   ezetimibe 10 MG tablet Commonly known as: ZETIA Take 1 tablet by mouth daily.   HumaLOG 100 UNIT/ML  cartridge Generic drug: insulin lispro Inject 40 Units into the skin daily. Use max of 40 units daily via insulin pump.   isosorbide mononitrate 60 MG 24 hr tablet Commonly known as: IMDUR Take 1/2 tablets by mouth every morning, take 1 tablet by mouth every evening. What changed:   how much to take  how to take this  when to take this  additional instructions   lisinopril 10 MG tablet Commonly known as: ZESTRIL Take 1 tablet (10 mg total) by mouth daily. Take 1 tablet by mouth once daily. What changed: additional instructions   multivitamin tablet Take 1 tablet by mouth daily.   nitroGLYCERIN 0.4 MG/SPRAY spray Commonly known as: Nitrolingual Place 1 spray under the tongue every 5 (five) minutes x 3 doses as needed for chest pain (Call 911 if taking 3rd spray).   OmniPod Dash 5 Pack Pods Misc APPLY ONE POD EVERY 3 DAYS AS DIRECTED   rosuvastatin 40 MG tablet Commonly known as: CRESTOR Take 1 tablet by mouth daily.       Allergies: No Known Allergies  Past Medical History:  Diagnosis Date  . Carotid artery occlusion   . Coronary artery disease    CABG in 1996. Most recent cardiac catheterization in 2013 showed patent grafts including LIMA to LAD, SVG to OM, SVG to diagonal and SVG to PDA.  . Diabetes mellitus without complication (HCC)   . Hyperlipidemia   . Hypertension   . PAD (peripheral artery disease) (HCC)     Past Surgical History:  Procedure Laterality Date  . CORONARY ARTERY BYPASS GRAFT  1996    Family History  Problem Relation Age of Onset  . Breast cancer Mother 72  . Stroke Mother   . Emphysema Father 69  . Heart attack Father     Social History:  reports  that she has never smoked. She has never used smokeless tobacco. She reports that she does not drink alcohol. No history on file for drug.      Review of Systems                                                            Lipids:  She is on Crestor 40 mg daily and since 7/19 also on Zetia  Has history of CAD  several years ago with coronary bypass LDL is controlled as follows   Lab Results  Component Value Date   CHOL 113 10/23/2018   HDL 63.20 10/23/2018   LDLCALC 37 10/23/2018   TRIG 46 04/05/2019   CHOLHDL 2 10/23/2018    Has had proliferative retinopathy, has been going regularly for exams  Neuropathy with some sensory loss: Forms filled out for diabetic shoes previously  HYPERTENSION:  This is treated with lisinopril 10 mg daily along with low-dose Coreg Previously lisinopril was reduced when blood pressure was low normal  BP Readings from Last 3 Encounters:  05/18/19 (!) 120/58  04/06/19 (!) 142/109  03/30/19 (!) 170/69     Lab Results  Component Value Date   CREATININE 0.76 05/14/2019   CREATININE 0.93 04/05/2019   CREATININE 0.64 03/30/2019    Recent Covid infection: She appears to have recovered clinically  LABS:  Lab on 05/14/2019  Component Date Value Ref Range Status  . Sodium 05/14/2019 140  135 -  145 mEq/L Final  . Potassium 05/14/2019 4.3  3.5 - 5.1 mEq/L Final  . Chloride 05/14/2019 105  96 - 112 mEq/L Final  . CO2 05/14/2019 30  19 - 32 mEq/L Final  . Glucose, Bld 05/14/2019 136* 70 - 99 mg/dL Final  . BUN 33/83/2919 25* 6 - 23 mg/dL Final  . Creatinine, Ser 05/14/2019 0.76  0.40 - 1.20 mg/dL Final  . Total Bilirubin 05/14/2019 0.5  0.2 - 1.2 mg/dL Final  . Alkaline Phosphatase 05/14/2019 71  39 - 117 U/L Final  . AST 05/14/2019 23  0 - 37 U/L Final  . ALT 05/14/2019 22  0 - 35 U/L Final  . Total Protein 05/14/2019 6.2  6.0 - 8.3 g/dL Final  . Albumin 16/60/6004 3.6  3.5 - 5.2 g/dL Final  . GFR 59/97/7414 73.77  >60.00 mL/min Final  .  Calcium 05/14/2019 9.2  8.4 - 10.5 mg/dL Final  . Hgb E3T MFr Bld 05/14/2019 7.8* 4.6 - 6.5 % Final   Glycemic Control Guidelines for People with Diabetes:Non Diabetic:  <6%Goal of Therapy: <7%Additional Action Suggested:  >8%      Physical Examination:  BP (!) 120/58 (BP Location: Left Arm, Patient Position: Sitting, Cuff Size: Normal)   Pulse 90   Ht 5\' 5"  (1.651 m)   Wt 163 lb (73.9 kg)   SpO2 96%   BMI 27.12 kg/m      ASSESSMENT:  Diabetes type 1, long-standing  A1c is 7.8  See history of present illness for analysis of current diabetes management, blood sugar patterns and problems identified  She is on an Omni pod insulin pump with Humalog insulin Even with using the Dexcom instead of the freestyle her blood sugars are not improving  She is concerned about potential for hypoglycemia and does not usually report tendency to consistently high readings which is occurring late in the evenings and overnight At times also has postprandial hyperglycemia with some variability She does not like to bolus before eating unless the blood sugar is over 140 for fear of hypoglycemia Still not interested in the closed-loop insulin pump with using the tubing and wants to continue OmniPod   Hypertension: Her blood pressure is stable with 10 mg lisinopril Renal function is stable  Will need follow-up microalbumin this year  LIPIDS: Well controlled in 2020 and will need follow-up    PLAN:   Basal rates will be increased to as follows Midnight = 0.6 instead of 0.5 and 9 PM = 0.65 instead of 0.55 Carbohydrate coverage 1: 7 at lunch and dinner also  Discussed that if her blood sugars are near normal she can bolus right after finishing eating and not wait till later than that If she continues to have high alerts of her blood sugars at night or any certain time she will need to let 2021 know Also discussed that she should not withhold any food, or meal intake or snack if she is having a  high sugar since take correction factor will bring her blood sugar down She can have a bedtime snack with a bolus  Influenza vaccine given even though she was reluctant to do so, likely can do her Covid vaccination in about 3 months after her infectious episode as generally recommended  There are no Patient Instructions on file for this visit.     [place] 05/20/2019, 3:10 PM   Note: This note was prepared with Dragon voice recognition system technology. Any transcriptional errors that result from this process  are unintentional.

## 2019-05-20 ENCOUNTER — Other Ambulatory Visit: Payer: Self-pay | Admitting: Cardiovascular Disease

## 2019-05-20 ENCOUNTER — Other Ambulatory Visit: Payer: Self-pay | Admitting: Endocrinology

## 2019-05-21 DIAGNOSIS — Z961 Presence of intraocular lens: Secondary | ICD-10-CM | POA: Diagnosis not present

## 2019-05-21 DIAGNOSIS — Z7952 Long term (current) use of systemic steroids: Secondary | ICD-10-CM | POA: Diagnosis not present

## 2019-05-21 DIAGNOSIS — E113593 Type 2 diabetes mellitus with proliferative diabetic retinopathy without macular edema, bilateral: Secondary | ICD-10-CM | POA: Diagnosis not present

## 2019-05-21 DIAGNOSIS — Z79899 Other long term (current) drug therapy: Secondary | ICD-10-CM | POA: Diagnosis not present

## 2019-05-21 DIAGNOSIS — H18423 Band keratopathy, bilateral: Secondary | ICD-10-CM | POA: Diagnosis not present

## 2019-05-21 DIAGNOSIS — Z794 Long term (current) use of insulin: Secondary | ICD-10-CM | POA: Diagnosis not present

## 2019-05-21 DIAGNOSIS — H35373 Puckering of macula, bilateral: Secondary | ICD-10-CM | POA: Diagnosis not present

## 2019-05-25 DIAGNOSIS — Z961 Presence of intraocular lens: Secondary | ICD-10-CM | POA: Diagnosis not present

## 2019-05-25 DIAGNOSIS — E113593 Type 2 diabetes mellitus with proliferative diabetic retinopathy without macular edema, bilateral: Secondary | ICD-10-CM | POA: Diagnosis not present

## 2019-05-25 DIAGNOSIS — H18423 Band keratopathy, bilateral: Secondary | ICD-10-CM | POA: Diagnosis not present

## 2019-05-25 DIAGNOSIS — Z794 Long term (current) use of insulin: Secondary | ICD-10-CM | POA: Diagnosis not present

## 2019-05-25 DIAGNOSIS — H35373 Puckering of macula, bilateral: Secondary | ICD-10-CM | POA: Diagnosis not present

## 2019-05-30 ENCOUNTER — Other Ambulatory Visit: Payer: Self-pay

## 2019-05-30 MED ORDER — GLUCOSE BLOOD VI STRP: ORAL_STRIP | 1 refills | Status: DC

## 2019-06-08 ENCOUNTER — Encounter: Payer: Self-pay | Admitting: Cardiovascular Disease

## 2019-06-08 ENCOUNTER — Ambulatory Visit (INDEPENDENT_AMBULATORY_CARE_PROVIDER_SITE_OTHER): Payer: Medicare Other | Admitting: Cardiovascular Disease

## 2019-06-08 ENCOUNTER — Other Ambulatory Visit: Payer: Self-pay

## 2019-06-08 VITALS — BP 120/56 | HR 81 | Ht 64.0 in | Wt 162.5 lb

## 2019-06-08 DIAGNOSIS — E785 Hyperlipidemia, unspecified: Secondary | ICD-10-CM

## 2019-06-08 DIAGNOSIS — I6521 Occlusion and stenosis of right carotid artery: Secondary | ICD-10-CM

## 2019-06-08 DIAGNOSIS — I2510 Atherosclerotic heart disease of native coronary artery without angina pectoris: Secondary | ICD-10-CM

## 2019-06-08 DIAGNOSIS — I10 Essential (primary) hypertension: Secondary | ICD-10-CM | POA: Diagnosis not present

## 2019-06-08 NOTE — Patient Instructions (Signed)
Medication Instructions:  Your physician recommends that you continue on your current medications as directed. Please refer to the Current Medication list given to you today.  *If you need a refill on your cardiac medications before your next appointment, please call your pharmacy*  Lab Work: None ordered If you have labs (blood work) drawn today and your tests are completely normal, you will receive your results only by: . MyChart Message (if you have MyChart) OR . A paper copy in the mail If you have any lab test that is abnormal or we need to change your treatment, we will call you to review the results.  Testing/Procedures: Your physician has requested that you have a carotid duplex. This test is an ultrasound of the carotid arteries in your neck. It looks at blood flow through these arteries that supply the brain with blood. Allow one hour for this exam. There are no restrictions or special instructions.    Follow-Up: At [facility], you and your health needs are our priority.  As part of our continuing mission to provide you with exceptional heart care, we have created designated Provider Care Teams.  These Care Teams include your primary Cardiologist (physician) and Advanced Practice Providers (APPs -  Physician Assistants and Nurse Practitioners) who all work together to provide you with the care you need, when you need it.  Your next appointment:   6 month(s)  The format for your next appointment:   In Person  Provider:    You may see Muhammad Arida, MD or one of the following Advanced Practice Providers on your designated Care Team:    Christopher Berge, NP  Ryan Dunn, PA-C  Jacquelyn Visser, PA-C   Other Instructions N/A  

## 2019-06-08 NOTE — Progress Notes (Signed)
Cardiology Office Note   Date:  06/08/2019   ID:  Natasha Hernandez, DOB 1940-09-01, MRN [id number]  PCP:  Salley Scarlet, MD  Cardiologist:  Kirke Corin  Chief Complaint  Patient presents with  . office visit    6 month F/U; Meds verbally reviewed with patient.      History of Present Illness: Natasha Hernandez is a 79 y.o. female who is here today for a follow-up visit regarding coronary artery disease and peripheral arterial disease.  She has extensive medical problems that include coronary artery disease status post CABG in 1996 at [facility], type 1 diabetes since she was 79 years old, hypertension, hyperlipidemia, peripheral arterial disease and carotid artery disease. Most recent cardiac catheterization was done in 2013 which showed patent grafts including SVG to OM, SVG to diagonal, SVG to PDA and LIMA to LAD. She is known to have occluded right carotid artery . She is a lifelong nonsmoker. She is known to have peripheral arterial disease with no significant claudication. Noninvasive vascular evaluation in August 2017 showed an ABI of 0.85 on the right and 1.1 on the left. Duplex showed no obstructive aortoiliac or SFA disease. There was one-vessel runoff below the knee bilaterally.  Most recent Lexiscan Myoview in January 2020 showed no evidence of ischemia with normal ejection fraction.  She was hospitalized in December with COVID-19 infection.  High-sensitivity troponin was normal.  She recovered completely from that.  She has been doing well with no recent chest pain, shortness of breath or palpitations.  Past Medical History:  Diagnosis Date  . Carotid artery occlusion   . Coronary artery disease    CABG in 1996. Most recent cardiac catheterization in 2013 showed patent grafts including LIMA to LAD, SVG to OM, SVG to diagonal and SVG to PDA.  . Diabetes mellitus without complication (HCC)   . Hyperlipidemia   . Hypertension   . PAD (peripheral artery  disease) (HCC)     Past Surgical History:  Procedure Laterality Date  . CARDIAC CATHETERIZATION    . CORONARY ARTERY BYPASS GRAFT  1996  . EYE SURGERY       Current Outpatient Medications  Medication Sig Dispense Refill  . aspirin EC 81 MG tablet Take 1 tablet (81 mg total) by mouth daily. 90 tablet 3  . Calcium-Magnesium-Vitamin D (CALCIUM MAGNESIUM PO) Take 1 tablet by mouth daily.    . carvedilol (COREG) 6.25 MG tablet Take 1 tablet by mouth twice daily with meals.**Please schedule a follow up appointment for additional refills.** 60 tablet 0  . Coenzyme Q10 (CO Q 10 PO) Take 1 capsule by mouth daily.    . Continuous Blood Gluc Sensor (DEXCOM G6 SENSOR) MISC 1 each by Does not apply route See admin instructions. Use one sensor once every 10 days to monitor blood sugars.    . Continuous Blood Gluc Transmit (DEXCOM G6 TRANSMITTER) MISC 1 each by Does not apply route See admin instructions. Use one transmitter once every 90 days to transmit blood sugars.    Marland Kitchen ezetimibe (ZETIA) 10 MG tablet Take 1 tablet by mouth daily. 90 tablet 1  . glucose blood test strip Use Contour Next test strips as instructed to check blood sugar 4 times daily. DX:E10.65 150 each 1  . Insulin Disposable Pump (OMNIPOD DASH 5 PACK PODS) MISC APPLY ONE POD EVERY 3 DAYS AS DIRECTED 10 each 11  . insulin lispro (HUMALOG) 100 UNIT/ML cartridge Inject 40 Units into the skin daily.  Use max of 40 units daily via insulin pump.    . isosorbide mononitrate (IMDUR) 60 MG 24 hr tablet Take 1/2 tablets by mouth every morning and,, take 1 tablet by mouth every evening. 45 tablet 0  . lisinopril (ZESTRIL) 10 MG tablet Take 1 tablet by mouth daily. 90 tablet 1  . Multiple Vitamin (MULTIVITAMIN) tablet Take 1 tablet by mouth daily.    . nitroGLYCERIN (NITROLINGUAL) 0.4 MG/SPRAY spray Place 1 spray under the tongue every 5 (five) minutes x 3 doses as needed for chest pain (Call 911 if taking 3rd spray). 4.9 g 1  . rosuvastatin  (CRESTOR) 40 MG tablet Take 1 tablet by mouth daily. 90 tablet 1   No current facility-administered medications for this visit.    Allergies:   Patient has no known allergies.    Social History:  The patient  reports that she has never smoked. She has never used smokeless tobacco. She reports that she does not drink alcohol.   Family History:  The patient's Family history is negative for coronary artery disease.   ROS:  Please see the history of present illness.   Otherwise, review of systems are positive for none.   All other systems are reviewed and negative.    PHYSICAL EXAM: VS:  BP (!) 120/56 (BP Location: Right Arm, Patient Position: Sitting, Cuff Size: Normal)   Pulse 81   Ht 5\' 4"  (1.626 m)   Wt 162 lb 8 oz (73.7 kg)   SpO2 96%   BMI 27.89 kg/m  , BMI Body mass index is 27.89 kg/m. GEN: Well nourished, well developed, in no acute distress  HEENT: normal  Neck: no JVD, carotid bruits, or masses Cardiac: RRR; no murmurs, rubs, or gallops,no edema  Respiratory:  clear to auscultation bilaterally, normal work of breathing GI: soft, nontender, nondistended, + BS MS: no deformity or atrophy  Skin: warm and dry, no rash Neuro:  Strength and sensation are intact Psych: euthymic mood, full affect She does have palpable distal pulses.  EKG:  EKG is ordered today. Normal sinus rhythm with no significant ST or T wave changes.  Recent Labs: 10/23/2018: TSH 3.70 04/05/2019: Hemoglobin 12.6; Magnesium 2.0; Platelets 210 05/14/2019: ALT 22; BUN 25; Creatinine, Ser 0.76; Potassium 4.3; Sodium 140    Lipid Panel    Component Value Date/Time   CHOL 113 10/23/2018 1559   TRIG 46 04/05/2019 1821   HDL 63.20 10/23/2018 1559   CHOLHDL 2 10/23/2018 1559   VLDL 13.2 10/23/2018 1559   LDLCALC 37 10/23/2018 1559      Wt Readings from Last 3 Encounters:  06/08/19 162 lb 8 oz (73.7 kg)  05/18/19 163 lb (73.9 kg)  04/05/19 154 lb (69.9 kg)       No flowsheet data  found.    ASSESSMENT AND PLAN:  1.  Coronary artery disease involving native coronary arteries without angina:  Most recent cardiac catheterization in 2013 showed patent grafts.  Lexiscan Myoview last year was normal.  Continue medical therapy.  2. Peripheral arterial disease: Currently with no significant claudication and no evidence of critical limb ischemia. Continue medical therapy.   3. 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   Most recent carotid Doppler in October of 2019 showed chronically occluded right coronary artery with mild nonobstructive disease on the left.  I requested repeat carotid Doppler.  4. Hyperlipidemia: Continue treatment with rosuvastatin and Zetia.  Most recent lipid profile showed an LDL of 37 and triglyceride  of 46.   5. Essential hypertension: Blood pressure is well controlled on current medications.    Disposition:   FU with me in 6 months  Signed,  Kathlyn Sacramento, MD  06/08/2019 1:46 PM    [place]

## 2019-06-18 ENCOUNTER — Other Ambulatory Visit: Payer: Self-pay | Admitting: Cardiovascular Disease

## 2019-06-18 ENCOUNTER — Other Ambulatory Visit: Payer: Self-pay

## 2019-06-18 ENCOUNTER — Ambulatory Visit (INDEPENDENT_AMBULATORY_CARE_PROVIDER_SITE_OTHER): Payer: Medicare Other

## 2019-06-18 DIAGNOSIS — I6521 Occlusion and stenosis of right carotid artery: Secondary | ICD-10-CM

## 2019-06-18 MED ORDER — OMNIPOD DASH PODS (GEN 4) MISC: 1.0000 | 11 refills | Status: DC

## 2019-06-19 ENCOUNTER — Other Ambulatory Visit: Payer: Self-pay | Admitting: Cardiovascular Disease

## 2019-06-19 ENCOUNTER — Other Ambulatory Visit: Payer: Self-pay | Admitting: Endocrinology

## 2019-06-19 DIAGNOSIS — E7800 Pure hypercholesterolemia, unspecified: Secondary | ICD-10-CM

## 2019-07-31 ENCOUNTER — Ambulatory Visit: Payer: Medicare Other | Admitting: Nurse Practitioner

## 2019-08-02 ENCOUNTER — Other Ambulatory Visit: Payer: Self-pay

## 2019-08-02 ENCOUNTER — Ambulatory Visit (INDEPENDENT_AMBULATORY_CARE_PROVIDER_SITE_OTHER): Payer: Medicare Other | Admitting: Nurse Practitioner

## 2019-08-02 VITALS — BP 138/70 | HR 83 | Temp 97.6°F | Resp 18 | Wt 166.4 lb

## 2019-08-02 DIAGNOSIS — H6123 Impacted cerumen, bilateral: Secondary | ICD-10-CM | POA: Diagnosis not present

## 2019-08-02 NOTE — Progress Notes (Signed)
Established Patient Office Visit  Subjective:  Patient ID: Natasha Hernandez, female    DOB: 14-Feb-1941  Age: 80 y.o. MRN: [id number]  CC:  Chief Complaint  Patient presents with  . Ear Fullness    needs cleaned    HPI Natasha Hernandez is a 79 year old female presenting for c/o ear pressure. She wears bilateral hearing aids and has h/o cerumen impaction. She reported that her ENT attempted ear lavage and was unsuccessful. She denied ear pain, nasal congestion, sob, cp, ct, gu/gi sxs, edema, or recent falls. No treatments have been tried.   Past Medical History:  Diagnosis Date  . Carotid artery occlusion   . Coronary artery disease    CABG in 1996. Most recent cardiac catheterization in 2013 showed patent grafts including LIMA to LAD, SVG to OM, SVG to diagonal and SVG to PDA.  . Diabetes mellitus without complication ([facility])   . Hyperlipidemia   . Hypertension   . PAD (peripheral artery disease) ([place])     Past Surgical History:  Procedure Laterality Date  . CARDIAC CATHETERIZATION    . CORONARY ARTERY BYPASS GRAFT  1996  . EYE SURGERY      Family History  Problem Relation Age of Onset  . Breast cancer Mother 46  . Stroke Mother   . Emphysema Father 5  . Heart attack Father     Social History   Socioeconomic History  . Marital status: Widowed    Spouse name: Not on file  . Number of children: Not on file  . Years of education: Not on file  . Highest education level: Not on file  Occupational History  . Occupation: Retired  Tobacco Use  . Smoking status: Never Smoker  . Smokeless tobacco: Never Used  Substance and Sexual Activity  . Alcohol use: No  . Drug use: Not on file  . Sexual activity: Not on file  Other Topics Concern  . Not on file  Social History Narrative   Pt lives w/ son, dtr-in-law and grandchildren.    Social Determinants of Health   Financial Resource Strain:   . Difficulty of Paying Living Expenses:   Food Insecurity:   . Worried  About Charity fundraiser in the Last Year:   . Arboriculturist in the Last Year:   Transportation Needs:   . Film/video editor (Medical):   Marland Kitchen Lack of Transportation (Non-Medical):   Physical Activity:   . Days of Exercise per Week:   . Minutes of Exercise per Session:   Stress:   . Feeling of Stress :   Social Connections:   . Frequency of Communication with Friends and Family:   . Frequency of Social Gatherings with Friends and Family:   . Attends Religious Services:   . Active Member of Clubs or Organizations:   . Attends Archivist Meetings:   Marland Kitchen Marital Status:   Intimate Partner Violence:   . Fear of Current or Ex-Partner:   . Emotionally Abused:   Marland Kitchen Physically Abused:   . Sexually Abused:     Outpatient Medications Prior to Visit  Medication Sig Dispense Refill  . aspirin EC 81 MG tablet Take 1 tablet (81 mg total) by mouth daily. 90 tablet 3  . Calcium-Magnesium-Vitamin D (CALCIUM MAGNESIUM PO) Take 1 tablet by mouth daily.    . carvedilol (COREG) 6.25 MG tablet Take 1 tablet by mouth twice daily with meals.**Please schedule a follow up appointment for additional  refills.** 60 tablet 3  . Coenzyme Q10 (CO Q 10 PO) Take 1 capsule by mouth daily.    . Continuous Blood Gluc Sensor (DEXCOM G6 SENSOR) MISC 1 each by Does not apply route See admin instructions. Use one sensor once every 10 days to monitor blood sugars.    . Continuous Blood Gluc Transmit (DEXCOM G6 TRANSMITTER) MISC 1 each by Does not apply route See admin instructions. Use one transmitter once every 90 days to transmit blood sugars.    Marland Kitchen ezetimibe (ZETIA) 10 MG tablet Take 1 tablet by mouth daily. 90 tablet 1  . glucose blood test strip Use Contour Next test strips as instructed to check blood sugar 4 times daily. DX:E10.65 150 each 1  . Insulin Disposable Pump (OMNIPOD DASH 5 PACK PODS) MISC 1 each by Other route See admin instructions. APPLY ONE POD EVERY 3 DAYS AS DIRECTED 10 each 11  . insulin  lispro (HUMALOG) 100 UNIT/ML cartridge Inject 40 Units into the skin daily. Use max of 40 units daily via insulin pump.    . isosorbide mononitrate (IMDUR) 60 MG 24 hr tablet Take 1/2 tablets by mouth every morning and,, take 1 tablet by mouth every evening. 45 tablet 3  . lisinopril (ZESTRIL) 10 MG tablet Take 1 tablet by mouth daily. 90 tablet 1  . Multiple Vitamin (MULTIVITAMIN) tablet Take 1 tablet by mouth daily.    . nitroGLYCERIN (NITROLINGUAL) 0.4 MG/SPRAY spray Place 1 spray under the tongue every 5 (five) minutes x 3 doses as needed for chest pain (Call 911 if taking 3rd spray). 4.9 g 1  . rosuvastatin (CRESTOR) 40 MG tablet Take 1 tablet by mouth daily. 90 tablet 1   No facility-administered medications prior to visit.    No Known Allergies  ROS Review of Systems  All other systems reviewed and are negative.  Physical Exam Vitals and nursing note reviewed.  Constitutional:      Appearance: Normal appearance.  HENT:     Head: Normocephalic.     Ears:     Comments: Bilateral hearing aids.  Bilateral cerumen impaction  Ear lavage well tolerated, partial removal to left ear, removal to right ear.  Right ear canal, ext ear, and partial TM  Visualized WNL remaining light whitish colored cerumen unable to irrigate., Left TM NL, small ear canal NL, ext ear WNL.     Nose: Nose normal.  Cardiovascular:     Rate and Rhythm: Normal rate.  Pulmonary:     Effort: Pulmonary effort is normal.  Musculoskeletal:        General: No swelling. Normal range of motion.     Cervical back: Normal range of motion and neck supple. No rigidity.  Skin:    General: Skin is warm and dry.     Capillary Refill: Capillary refill takes less than 2 seconds.  Neurological:     General: No focal deficit present.     Mental Status: She is alert and oriented to person, place, and time.        Objective:     BP 138/70 (BP Location: Left Arm, Patient Position: Sitting, Cuff Size: Normal)   Pulse  83   Temp 97.6 F (36.4 C) (Temporal)   Resp 18   Wt 166 lb 6.4 oz (75.5 kg)   SpO2 96%   BMI 28.56 kg/m  Wt Readings from Last 3 Encounters:  08/02/19 166 lb 6.4 oz (75.5 kg)  06/08/19 162 lb 8 oz (73.7 kg)  05/18/19  163 lb (73.9 kg)     Health Maintenance Due  Topic Date Due  . TETANUS/TDAP  Never done  . DEXA SCAN  Never done  . FOOT EXAM  11/05/2018    Lab Results  Component Value Date   TSH 3.70 10/23/2018   Lab Results  Component Value Date   WBC 7.0 04/05/2019   HGB 12.6 04/05/2019   HCT 38.2 04/05/2019   MCV 101.6 (H) 04/05/2019   PLT 210 04/05/2019   Lab Results  Component Value Date   NA 140 05/14/2019   K 4.3 05/14/2019   CO2 30 05/14/2019   GLUCOSE 136 (H) 05/14/2019   BUN 25 (H) 05/14/2019   CREATININE 0.76 05/14/2019   BILITOT 0.5 05/14/2019   ALKPHOS 71 05/14/2019   AST 23 05/14/2019   ALT 22 05/14/2019   PROT 6.2 05/14/2019   ALBUMIN 3.6 05/14/2019   CALCIUM 9.2 05/14/2019   ANIONGAP 9 04/05/2019   GFR 73.77 05/14/2019   Lab Results  Component Value Date   CHOL 113 10/23/2018   Lab Results  Component Value Date   HDL 63.20 10/23/2018   Lab Results  Component Value Date   LDLCALC 37 10/23/2018   Lab Results  Component Value Date   TRIG 46 04/05/2019   Lab Results  Component Value Date   CHOLHDL 2 10/23/2018   Lab Results  Component Value Date   HGBA1C 7.8 (H) 05/14/2019      Assessment & Plan:   Problem List Items Addressed This Visit    None    Visit Diagnoses    Bilateral impacted cerumen    -  Primary   Relevant Orders   Ear wax removal    ear lavage completed and partially clearedcerumen impaction Continue Debrox over the counter at home  Earwax buildup Adult English reviewed and printed for pt.  Follow-up: Return if symptoms worsen or fail to improve.    Elmore Guise, FNP

## 2019-08-02 NOTE — Patient Instructions (Addendum)
ear lavage completed and partially clearedcerumen impaction Continue Debrox over the counter at home  Earwax buildup Adult English reviewed and printed for pt.

## 2019-08-06 ENCOUNTER — Encounter (INDEPENDENT_AMBULATORY_CARE_PROVIDER_SITE_OTHER): Payer: Medicare Other | Admitting: Ophthalmology

## 2019-08-13 ENCOUNTER — Other Ambulatory Visit: Payer: Self-pay

## 2019-08-13 ENCOUNTER — Ambulatory Visit (INDEPENDENT_AMBULATORY_CARE_PROVIDER_SITE_OTHER): Payer: Medicare Other | Admitting: Ophthalmology

## 2019-08-13 ENCOUNTER — Encounter (INDEPENDENT_AMBULATORY_CARE_PROVIDER_SITE_OTHER): Payer: Self-pay | Admitting: Ophthalmology

## 2019-08-13 DIAGNOSIS — E103551 Type 1 diabetes mellitus with stable proliferative diabetic retinopathy, right eye: Secondary | ICD-10-CM | POA: Diagnosis not present

## 2019-08-13 DIAGNOSIS — E103512 Type 1 diabetes mellitus with proliferative diabetic retinopathy with macular edema, left eye: Secondary | ICD-10-CM

## 2019-08-13 DIAGNOSIS — H35372 Puckering of macula, left eye: Secondary | ICD-10-CM

## 2019-08-13 DIAGNOSIS — H35371 Puckering of macula, right eye: Secondary | ICD-10-CM

## 2019-08-13 DIAGNOSIS — E103511 Type 1 diabetes mellitus with proliferative diabetic retinopathy with macular edema, right eye: Secondary | ICD-10-CM | POA: Diagnosis not present

## 2019-08-13 DIAGNOSIS — E113551 Type 2 diabetes mellitus with stable proliferative diabetic retinopathy, right eye: Secondary | ICD-10-CM

## 2019-08-13 DIAGNOSIS — E11319 Type 2 diabetes mellitus with unspecified diabetic retinopathy without macular edema: Secondary | ICD-10-CM | POA: Insufficient documentation

## 2019-08-13 DIAGNOSIS — E103552 Type 1 diabetes mellitus with stable proliferative diabetic retinopathy, left eye: Secondary | ICD-10-CM | POA: Diagnosis not present

## 2019-08-13 DIAGNOSIS — E113552 Type 2 diabetes mellitus with stable proliferative diabetic retinopathy, left eye: Secondary | ICD-10-CM | POA: Insufficient documentation

## 2019-08-13 NOTE — Assessment & Plan Note (Signed)
The nature of regressed proliferative diabetic retinopathy was discussed with the patient. The patient was advised to maintain good glucose, blood pressure, monitor kidney function and serum lipid control as advised by personal physician. Rare risk for reactivation of progression exist with untreated severe anemia, untreated renal failure, untreated heart failure, and smoking. Complete avoidance of smoking was recommended. The chance of recurrent proliferative diabetic retinopathy was discussed as well as the chance of vitreous hemorrhage for which further treatments may be necessary. 

## 2019-08-13 NOTE — Progress Notes (Signed)
08/13/2019     CHIEF COMPLAINT Patient presents for Retina Follow Up   HISTORY OF PRESENT ILLNESS: Natasha Hernandez is a 79 y.o. female who presents to the clinic today for:   HPI    Retina Follow Up    Patient presents with  Diabetic Retinopathy.  In both eyes.  This started 6 months ago.  Severity is mild.  Duration of 6 months.  Since onset it is stable.          Comments    6 Month Diabetic Exam OU  Pt denies noticeable changes to [place] OU since last visit. Pt denies ocular pain, flashes of light, or floaters OU.  LBS: 160 this AM A1c: 7.2, 01/2019        Last edited by Ileana Roup, COA on 08/13/2019  8:17 AM. (History)      Referring physician: Salley Scarlet, MD [id number] [place],  [place] [address]  HISTORICAL INFORMATION:   Selected notes from the MEDICAL RECORD NUMBER    Lab Results  Component Value Date   HGBA1C 7.8 (H) 05/14/2019     CURRENT MEDICATIONS: No current outpatient medications on file. (Ophthalmic Drugs)   No current facility-administered medications for this visit. (Ophthalmic Drugs)   Current Outpatient Medications (Other)  Medication Sig  . aspirin EC 81 MG tablet Take 1 tablet (81 mg total) by mouth daily.  . Calcium-Magnesium-Vitamin D (CALCIUM MAGNESIUM PO) Take 1 tablet by mouth daily.  . carvedilol (COREG) 6.25 MG tablet Take 1 tablet by mouth twice daily with meals.**Please schedule a follow up appointment for additional refills.**  . Coenzyme Q10 (CO Q 10 PO) Take 1 capsule by mouth daily.  . Continuous Blood Gluc Sensor (DEXCOM G6 SENSOR) MISC 1 each by Does not apply route See admin instructions. Use one sensor once every 10 days to monitor blood sugars.  . Continuous Blood Gluc Transmit (DEXCOM G6 TRANSMITTER) MISC 1 each by Does not apply route See admin instructions. Use one transmitter once every 90 days to transmit blood sugars.  Marland Kitchen ezetimibe (ZETIA) 10 MG tablet Take 1 tablet by mouth daily.  Marland Kitchen glucose blood  test strip Use Contour Next test strips as instructed to check blood sugar 4 times daily. DX:E10.65  . Insulin Disposable Pump (OMNIPOD DASH 5 PACK PODS) MISC 1 each by Other route See admin instructions. APPLY ONE POD EVERY 3 DAYS AS DIRECTED  . insulin lispro (HUMALOG) 100 UNIT/ML cartridge Inject 40 Units into the skin daily. Use max of 40 units daily via insulin pump.  . isosorbide mononitrate (IMDUR) 60 MG 24 hr tablet Take 1/2 tablets by mouth every morning and,, take 1 tablet by mouth every evening.  Marland Kitchen lisinopril (ZESTRIL) 10 MG tablet Take 1 tablet by mouth daily.  . Multiple Vitamin (MULTIVITAMIN) tablet Take 1 tablet by mouth daily.  . nitroGLYCERIN (NITROLINGUAL) 0.4 MG/SPRAY spray Place 1 spray under the tongue every 5 (five) minutes x 3 doses as needed for chest pain (Call 911 if taking 3rd spray).  . rosuvastatin (CRESTOR) 40 MG tablet Take 1 tablet by mouth daily.   No current facility-administered medications for this visit. (Other)      REVIEW OF SYSTEMS:    ALLERGIES No Known Allergies  PAST MEDICAL HISTORY Past Medical History:  Diagnosis Date  . Carotid artery occlusion   . Coronary artery disease    CABG in 1996. Most recent cardiac catheterization in 2013 showed patent grafts including LIMA to  LAD, SVG to OM, SVG to diagonal and SVG to PDA.  . Diabetes mellitus without complication ([place])   . Hyperlipidemia   . Hypertension   . PAD (peripheral artery disease) ([place])    Past Surgical History:  Procedure Laterality Date  . CARDIAC CATHETERIZATION    . CORONARY ARTERY BYPASS GRAFT  1996  . EYE SURGERY      FAMILY HISTORY Family History  Problem Relation Age of Onset  . Breast cancer Mother 29  . Stroke Mother   . Emphysema Father 16  . Heart attack Father     SOCIAL HISTORY Social History   Tobacco Use  . Smoking status: Never Smoker  . Smokeless tobacco: Never Used  Substance Use Topics  . Alcohol use: No  . Drug use: Not on file          OPHTHALMIC EXAM:  Base Eye Exam    Visual Acuity (Snellen - Linear)      Right Left   Dist cc 20/25 -2 20/25 -2   Correction: Glasses       Tonometry (Tonopen, 8:21 AM)      Right Left   Pressure 14 13       Pupils      Pupils Dark Light Shape React APD   Right PERRL 4 3 Round Brisk None   Left PERRL 4 3 Round Brisk None       Visual Fields (Counting fingers)      Left Right    Full Full       Extraocular Movement      Right Left    Full Full       Neuro/Psych    Oriented x3: Yes   Mood/Affect: Normal       Dilation    Both eyes: 1.0% Mydriacyl, 2.5% Phenylephrine @ 8:22 AM        Slit Lamp and Fundus Exam    External Exam      Right Left   External Normal Normal       Slit Lamp Exam      Right Left   Lids/Lashes Normal Normal   Conjunctiva/Sclera White and quiet White and quiet   Cornea Clear Clear   Anterior Chamber Deep and quiet Deep and quiet   Iris Round and reactive Round and reactive   Lens Posterior chamber intraocular lens, Open posterior capsule Posterior chamber intraocular lens, Open posterior capsule   Anterior Vitreous Normal Normal       Fundus Exam      Right Left   Posterior Vitreous  Clear media with vitreal papillary traction   Disc Normal Neovascularization of the disc, white fibrotic, vit Pap traction   C/D Ratio 0.1 0.1   Macula Few microaneurysms,, Epiretinal membrane Few microaneurysms,, Epiretinal membrane   Vessels PDR, quiescent PDR, quiescent   Periphery Good PRP retina attached Good PRP retina attached          IMAGING AND PROCEDURES  Imaging and Procedures for @TODAY @  Color Fundus Photography Optos - OU - Both Eyes       Right Eye Disc findings include normal observations. Macula : epiretinal membrane, microaneurysms.   Left Eye Progression has been stable. Disc findings include pallor, neovascularization. Macula : epiretinal membrane, microaneurysms.   Notes Quiescent proliferative diabetic  retinopathy with extensive previous peripheral panretinal photocoagulation, stable. 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 The patient was advised to maintain good glucose, blood pressure, monitor kidney function and serum lipid control as advised by personal physician. Rare risk for reactivation of progression exist with untreated severe anemia, untreated renal failure, untreated heart failure, and smoking. Complete avoidance of smoking was recommended. The chance of recurrent proliferative diabetic retinopathy was discussed as well as the chance of vitreous hemorrhage for which further treatments may be necessary.  Right epiretinal membrane The nature of macular pucker (epiretinal membrane ERM) was discussed with the patient as well as threshold criteria for vitrectomy surgery. I explained that in rare cases another surgery is needed to actually remove a second wrinkle should it regrow.  Most often, the epiretinal membrane and underlying wrinkled internal limiting membrane are removed with the first surgery, to accomplish the goals.   If the operative eye is [place] (natural lens still present), cataract surgery is often recommended prior to Vitrectomy. This will enable the retina surgeon to have the best view during surgery and the patient to obtain optimal results in the future. Treatment options were discussed.      ICD-10-CM   1. Stable treated proliferative diabetic retinopathy of right eye determined by examination associated with type 2 diabetes mellitus (HCC)  O70.9628 Color Fundus Photography Optos - OU - Both Eyes  2. Right epiretinal membrane  H35.371 OCT, Retina - OU - Both Eyes  3. Left  epiretinal membrane  H35.372   4. Stable treated proliferative diabetic retinopathy of left eye determined by examination associated with type 2 diabetes mellitus (HCC)  Z66.2947 Color Fundus Photography Optos - OU - Both Eyes  5. Stable treated proliferative diabetic retinopathy of right eye with macular edema determined by examination associated with type 1 diabetes mellitus (HCC)  M54.6503    E10.3511   6. 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  2.  3.  Ophthalmic Meds Ordered this visit:  No orders of the defined types were placed in this encounter.      Return in about 9 months (around 05/14/2020) for DILATE OU, OCT.  There are no Patient Instructions on file for this visit.   Explained the diagnoses, plan, and follow up with the patient and they expressed understanding.  Patient expressed understanding of the importance of proper follow up care.   Alford Highland Fedor Kazmierski M.D. Diseases & Surgery of the Retina and Vitreous [facility] @TODAY @     Abbreviations: M myopia (nearsighted); A astigmatism; H hyperopia (farsighted); P presbyopia; Mrx spectacle prescription;  CTL contact lenses; OD right eye; OS left eye; OU both eyes  XT exotropia; ET esotropia; PEK punctate epithelial keratitis; PEE punctate epithelial erosions; DES dry eye syndrome; MGD meibomian gland dysfunction; ATs artificial tears; PFAT's preservative free artificial tears; NSC nuclear sclerotic cataract; PSC posterior subcapsular cataract; ERM epi-retinal membrane; PVD posterior vitreous detachment; RD retinal detachment; DM diabetes mellitus; DR diabetic retinopathy; NPDR non-proliferative diabetic retinopathy; PDR proliferative diabetic retinopathy; CSME clinically significant macular edema; DME diabetic macular edema; dbh dot blot hemorrhages; CWS cotton wool  spot; POAG primary open angle glaucoma; C/D cup-to-disc ratio; HVF humphrey visual field; GVF goldmann visual field; OCT optical coherence tomography;  IOP intraocular pressure; BRVO Branch retinal vein occlusion; CRVO central retinal vein occlusion; CRAO central retinal artery occlusion; BRAO branch retinal artery occlusion; RT retinal tear; SB scleral buckle; PPV pars plana vitrectomy; VH Vitreous hemorrhage; PRP panretinal laser photocoagulation; IVK intravitreal kenalog; VMT vitreomacular traction; MH Macular hole;  NVD neovascularization of the disc; NVE neovascularization elsewhere; AREDS age related eye disease study; ARMD age related macular degeneration; POAG primary open angle glaucoma; EBMD epithelial/anterior basement membrane dystrophy; ACIOL anterior chamber intraocular lens; IOL intraocular lens; PCIOL posterior chamber intraocular lens; Phaco/IOL phacoemulsification with intraocular lens placement; [place] photorefractive keratectomy; LASIK laser assisted in situ keratomileusis; HTN hypertension; DM diabetes mellitus; COPD chronic obstructive pulmonary disease

## 2019-08-13 NOTE — Assessment & Plan Note (Signed)
>>  ASSESSMENT AND PLAN FOR STABLE TREATED PROLIFERATIVE DIABETIC RETINOPATHY OF LEFT EYE WITH MACULAR EDEMA DETERMINED BY EXAMINATION ASSOCIATED WITH TYPE 1 DIABETES MELLITUS (HCC) WRITTEN ON 08/13/2019  9:16 AM BY ELNER ARLEY LABOR, MD  The nature of regressed proliferative diabetic retinopathy was discussed with the patient. The patient was advised to maintain good glucose, blood pressure, monitor kidney function and serum lipid control as advised by personal physician. Rare risk for reactivation of progression exist with untreated severe anemia, untreated renal failure, untreated heart failure, and smoking. Complete avoidance of smoking was recommended. The chance of recurrent proliferative diabetic retinopathy was discussed as well as the chance of vitreous hemorrhage for which further treatments may be necessary.

## 2019-08-13 NOTE — Progress Notes (Signed)
08/13/2019     CHIEF COMPLAINT Patient presents for Retina Follow Up   HISTORY OF PRESENT ILLNESS: Natasha Hernandez is a 79 y.o. female who presents to the clinic today for:   HPI    Retina Follow Up    Patient presents with  Diabetic Retinopathy.  In both eyes.  This started 6 months ago.  Severity is mild.  Duration of 6 months.  Since onset it is stable.          Comments    6 Month Diabetic Exam OU  Pt denies noticeable changes to [place] OU since last visit. Pt denies ocular pain, flashes of light, or floaters OU.  LBS: 160 this AM A1c: 7.2, 01/2019        Last edited by Ileana Roup, COA on 08/13/2019  8:17 AM. (History)      Referring physician: Salley Scarlet, MD [id number] [place],  [place] [address]  HISTORICAL INFORMATION:   Selected notes from the MEDICAL RECORD NUMBER    Lab Results  Component Value Date   HGBA1C 7.8 (H) 05/14/2019     CURRENT MEDICATIONS: No current outpatient medications on file. (Ophthalmic Drugs)   No current facility-administered medications for this visit. (Ophthalmic Drugs)   Current Outpatient Medications (Other)  Medication Sig  . aspirin EC 81 MG tablet Take 1 tablet (81 mg total) by mouth daily.  . Calcium-Magnesium-Vitamin D (CALCIUM MAGNESIUM PO) Take 1 tablet by mouth daily.  . carvedilol (COREG) 6.25 MG tablet Take 1 tablet by mouth twice daily with meals.**Please schedule a follow up appointment for additional refills.**  . Coenzyme Q10 (CO Q 10 PO) Take 1 capsule by mouth daily.  . Continuous Blood Gluc Sensor (DEXCOM G6 SENSOR) MISC 1 each by Does not apply route See admin instructions. Use one sensor once every 10 days to monitor blood sugars.  . Continuous Blood Gluc Transmit (DEXCOM G6 TRANSMITTER) MISC 1 each by Does not apply route See admin instructions. Use one transmitter once every 90 days to transmit blood sugars.  Marland Kitchen ezetimibe (ZETIA) 10 MG tablet Take 1 tablet by mouth daily.  Marland Kitchen glucose blood  test strip Use Contour Next test strips as instructed to check blood sugar 4 times daily. DX:E10.65  . Insulin Disposable Pump (OMNIPOD DASH 5 PACK PODS) MISC 1 each by Other route See admin instructions. APPLY ONE POD EVERY 3 DAYS AS DIRECTED  . insulin lispro (HUMALOG) 100 UNIT/ML cartridge Inject 40 Units into the skin daily. Use max of 40 units daily via insulin pump.  . isosorbide mononitrate (IMDUR) 60 MG 24 hr tablet Take 1/2 tablets by mouth every morning and,, take 1 tablet by mouth every evening.  Marland Kitchen lisinopril (ZESTRIL) 10 MG tablet Take 1 tablet by mouth daily.  . Multiple Vitamin (MULTIVITAMIN) tablet Take 1 tablet by mouth daily.  . nitroGLYCERIN (NITROLINGUAL) 0.4 MG/SPRAY spray Place 1 spray under the tongue every 5 (five) minutes x 3 doses as needed for chest pain (Call 911 if taking 3rd spray).  . rosuvastatin (CRESTOR) 40 MG tablet Take 1 tablet by mouth daily.   No current facility-administered medications for this visit. (Other)      REVIEW OF SYSTEMS:    ALLERGIES No Known Allergies  PAST MEDICAL HISTORY Past Medical History:  Diagnosis Date  . Carotid artery occlusion   . Coronary artery disease    CABG in 1996. Most recent cardiac catheterization in 2013 showed patent grafts including LIMA to  LAD, SVG to OM, SVG to diagonal and SVG to PDA.  . Diabetes mellitus without complication ([place])   . Hyperlipidemia   . Hypertension   . PAD (peripheral artery disease) ([place])    Past Surgical History:  Procedure Laterality Date  . CARDIAC CATHETERIZATION    . CORONARY ARTERY BYPASS GRAFT  1996  . EYE SURGERY      FAMILY HISTORY Family History  Problem Relation Age of Onset  . Breast cancer Mother 53  . Stroke Mother   . Emphysema Father 42  . Heart attack Father     SOCIAL HISTORY Social History   Tobacco Use  . Smoking status: Never Smoker  . Smokeless tobacco: Never Used  Substance Use Topics  . Alcohol use: No  . Drug use: Not on file          OPHTHALMIC EXAM: Base Eye Exam    Visual Acuity (Snellen - Linear)      Right Left   Dist cc 20/25 -2 20/25 -2   Correction: Glasses       Tonometry (Tonopen, 8:21 AM)      Right Left   Pressure 14 13       Pupils      Pupils Dark Light Shape React APD   Right PERRL 4 3 Round Brisk None   Left PERRL 4 3 Round Brisk None       Visual Fields (Counting fingers)      Left Right    Full Full       Extraocular Movement      Right Left    Full Full       Neuro/Psych    Oriented x3: Yes   Mood/Affect: Normal       Dilation    Both eyes: 1.0% Mydriacyl, 2.5% Phenylephrine @ 8:22 AM        Slit Lamp and Fundus Exam    External Exam      Right Left   External Normal Normal       Slit Lamp Exam      Right Left   Lids/Lashes Normal Normal   Conjunctiva/Sclera White and quiet White and quiet   Cornea Clear Clear   Anterior Chamber Deep and quiet Deep and quiet   Iris Round and reactive Round and reactive   Lens Posterior chamber intraocular lens, Open posterior capsule Posterior chamber intraocular lens, Open posterior capsule   Anterior Vitreous Normal Normal       Fundus Exam      Right Left   Posterior Vitreous  Clear media with vitreal papillary traction   Disc Normal Neovascularization of the disc, white fibrotic, vit Pap traction   C/D Ratio 0.1 0.1   Macula Few microaneurysms,, Epiretinal membrane Few microaneurysms,, Epiretinal membrane   Vessels PDR, quiescent PDR, quiescent   Periphery Good PRP retina attached Good PRP retina attached          IMAGING AND PROCEDURES  Imaging and Procedures for 08/13/19  Color Fundus Photography Optos - OU - Both Eyes       Right Eye Disc findings include normal observations. Macula : epiretinal membrane, microaneurysms.   Left Eye Progression has been stable. Disc findings include pallor, neovascularization. Macula : epiretinal membrane, microaneurysms.   Notes Quiescent proliferative diabetic  retinopathy with extensive previous peripheral panretinal photocoagulation, stable. 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 The patient was advised to maintain good glucose, blood pressure, monitor kidney function and serum lipid control as advised by personal physician. Rare risk for reactivation of progression exist with untreated severe anemia, untreated renal failure, untreated heart failure, and smoking. Complete avoidance of smoking was recommended. The chance of recurrent proliferative diabetic retinopathy was discussed as well as the chance of vitreous hemorrhage for which further treatments may be necessary.      ICD-10-CM   1. Stable treated proliferative diabetic retinopathy of right eye determined by examination associated with type 2 diabetes mellitus (HCC)  V76.1607 Color Fundus Photography Optos - OU - Both Eyes  2. Right epiretinal membrane  H35.371 OCT, Retina - OU - Both Eyes  3. Left epiretinal membrane  H35.372   4. Stable treated proliferative diabetic retinopathy of left eye determined by examination associated with type 2 diabetes mellitus (HCC)  P71.0626 Color Fundus Photography Optos - OU - Both Eyes  5. Stable treated proliferative diabetic retinopathy of right eye with macular edema determined by examination associated with type 1 diabetes mellitus (HCC)  R48.5462    E10.3511   6. Stable treated proliferative diabetic retinopathy of left eye with macular edema determined by examination associated with type 1 diabetes mellitus (HCC)  V03.5009    F81.8299     1.  2.  3.  Ophthalmic Meds Ordered this visit:  No orders of the defined types were placed in this  encounter.      Return in about 9 months (around 05/14/2020) for DILATE OU, OCT.  There are no Patient Instructions on file for this visit.   Explained the diagnoses, plan, and follow up with the patient and they expressed understanding.  Patient expressed understanding of the importance of proper follow up care.   Alford Highland Anabia Weatherwax M.D. Diseases & Surgery of the Retina and Vitreous [facility] 08/13/19     Abbreviations: M myopia (nearsighted); A astigmatism; H hyperopia (farsighted); P presbyopia; Mrx spectacle prescription;  CTL contact lenses; OD right eye; OS left eye; OU both eyes  XT exotropia; ET esotropia; PEK punctate epithelial keratitis; PEE punctate epithelial erosions; DES dry eye syndrome; MGD meibomian gland dysfunction; ATs artificial tears; PFAT's preservative free artificial tears; NSC nuclear sclerotic cataract; PSC posterior subcapsular cataract; ERM epi-retinal membrane; PVD posterior vitreous detachment; RD retinal detachment; DM diabetes mellitus; DR diabetic retinopathy; NPDR non-proliferative diabetic retinopathy; PDR proliferative diabetic retinopathy; CSME clinically significant macular edema; DME diabetic macular edema; dbh dot blot hemorrhages; CWS cotton wool spot; POAG primary open angle glaucoma; C/D cup-to-disc ratio; HVF humphrey visual field; GVF goldmann visual field; OCT optical coherence tomography; IOP intraocular pressure; BRVO Branch retinal vein occlusion; CRVO central retinal vein occlusion; CRAO central retinal artery occlusion; BRAO branch retinal artery occlusion; RT retinal tear; SB scleral buckle; PPV pars plana vitrectomy; VH Vitreous hemorrhage; PRP panretinal laser photocoagulation; IVK intravitreal kenalog; VMT vitreomacular traction; MH Macular hole;  NVD neovascularization of the disc; NVE neovascularization elsewhere; AREDS age related eye disease study; ARMD age related macular degeneration; POAG primary open angle glaucoma;  EBMD epithelial/anterior basement membrane dystrophy; ACIOL anterior chamber intraocular lens; IOL intraocular lens; PCIOL posterior chamber intraocular lens; Phaco/IOL phacoemulsification with intraocular lens placement; PRK photorefractive keratectomy; LASIK laser assisted in situ keratomileusis; HTN hypertension; DM diabetes mellitus; COPD chronic  obstructive pulmonary disease

## 2019-08-13 NOTE — Assessment & Plan Note (Signed)

## 2019-08-20 ENCOUNTER — Other Ambulatory Visit: Payer: Medicare Other

## 2019-08-21 ENCOUNTER — Other Ambulatory Visit: Payer: Medicare Other

## 2019-08-22 ENCOUNTER — Other Ambulatory Visit: Payer: Medicare Other

## 2019-08-24 ENCOUNTER — Ambulatory Visit: Payer: Medicare Other | Admitting: Endocrinology

## 2019-09-07 ENCOUNTER — Other Ambulatory Visit (INDEPENDENT_AMBULATORY_CARE_PROVIDER_SITE_OTHER): Payer: Medicare Other

## 2019-09-07 ENCOUNTER — Other Ambulatory Visit: Payer: Self-pay

## 2019-09-07 DIAGNOSIS — E1065 Type 1 diabetes mellitus with hyperglycemia: Secondary | ICD-10-CM | POA: Diagnosis not present

## 2019-09-07 DIAGNOSIS — E7800 Pure hypercholesterolemia, unspecified: Secondary | ICD-10-CM

## 2019-09-07 LAB — MICROALBUMIN / CREATININE URINE RATIO
Creatinine,U: 132.3 mg/dL
Microalb Creat Ratio: 0.9 mg/g (ref 0.0–30.0)
Microalb, Ur: 1.2 mg/dL (ref 0.0–1.9)

## 2019-09-07 LAB — COMPREHENSIVE METABOLIC PANEL
ALT: 20 U/L (ref 0–35)
AST: 20 U/L (ref 0–37)
Albumin: 3.9 g/dL (ref 3.5–5.2)
Alkaline Phosphatase: 72 U/L (ref 39–117)
BUN: 37 mg/dL — ABNORMAL HIGH (ref 6–23)
CO2: 32 mEq/L (ref 19–32)
Calcium: 9.3 mg/dL (ref 8.4–10.5)
Chloride: 102 mEq/L (ref 96–112)
Creatinine, Ser: 0.99 mg/dL (ref 0.40–1.20)
GFR: 54.32 mL/min — ABNORMAL LOW (ref >60.00)
Glucose, Bld: 131 mg/dL — ABNORMAL HIGH (ref 70–99)
Potassium: 4.4 mEq/L (ref 3.5–5.1)
Sodium: 137 mEq/L (ref 135–145)
Total Bilirubin: 0.4 mg/dL (ref 0.2–1.2)
Total Protein: 5.9 g/dL — ABNORMAL LOW (ref 6.0–8.3)

## 2019-09-07 LAB — LIPID PANEL
Cholesterol: 131 mg/dL (ref 0–200)
HDL: 70.3 mg/dL (ref >39.00)
LDL Cholesterol: 51 mg/dL (ref 0–99)
NonHDL: 60.59
Total CHOL/HDL Ratio: 2
Triglycerides: 47 mg/dL (ref 0.0–149.0)
VLDL: 9.4 mg/dL (ref 0.0–40.0)

## 2019-09-07 LAB — HEMOGLOBIN A1C: Hgb A1c MFr Bld: 8.4 % — ABNORMAL HIGH (ref 4.6–6.5)

## 2019-09-10 ENCOUNTER — Encounter: Payer: Self-pay | Admitting: Endocrinology

## 2019-09-10 ENCOUNTER — Other Ambulatory Visit: Payer: Self-pay

## 2019-09-10 ENCOUNTER — Ambulatory Visit (INDEPENDENT_AMBULATORY_CARE_PROVIDER_SITE_OTHER): Payer: Medicare Other | Admitting: Endocrinology

## 2019-09-10 VITALS — BP 112/66 | HR 81 | Ht 64.0 in | Wt 166.2 lb

## 2019-09-10 DIAGNOSIS — E1065 Type 1 diabetes mellitus with hyperglycemia: Secondary | ICD-10-CM | POA: Diagnosis not present

## 2019-09-10 DIAGNOSIS — I10 Essential (primary) hypertension: Secondary | ICD-10-CM | POA: Diagnosis not present

## 2019-09-10 DIAGNOSIS — E7800 Pure hypercholesterolemia, unspecified: Secondary | ICD-10-CM | POA: Diagnosis not present

## 2019-09-10 DIAGNOSIS — I6521 Occlusion and stenosis of right carotid artery: Secondary | ICD-10-CM | POA: Diagnosis not present

## 2019-09-10 NOTE — Progress Notes (Signed)
Patient ID: Natasha Hernandez, female   DOB: 08/22/1940, 79 y.o.   MRN: [id number]            Reason for Appointment : Follow-up for Type 1 Diabetes    History of Present Illness          Diagnosis: Type 1 diabetes mellitus, date of diagnosis: 1953          Previous history:  She thinks she has been on various insulin regimens over the years but has been on Lantus for 10-12 years along with mealtime insulin Previously followed by an endocrinologist but no records available. Her A1c has usually been around 7% reportedly and was 7.2 in 7/16   Recent history:    INSULIN PUMP settings BASAL rate midnight = 0.6, 5 AM = 0. 55.  12 PM = 0.70.  6 PM = 0.6 and 9 PM = 0.65 with total basal 15 units  Carbohydrate coverage 1: 7 breakfast and dinner, 1: 6 at lunch, sensitivity 1: 60 and active insulin 4 hours. Blood sugar target 135-140  A1c is 8.4 compared to 7.8 and has been generally around 8%  Current management, blood sugar patterns and problems identified:     She was last seen in 1/21  She has almost no change in her blood sugar patterns and level of control compared to her last visit as judged by her CGM  Despite increasing her basal rate late evening and overnight her blood sugars are still about the same  Also may be getting higher readings after evening meals  She thinks that her blood sugars do not react well enough with her correction boluses  Usually consistent with bolusing before starting the meal  She has changed her coverage for breakfast and dinner to 1: 7 instead of 1.6  No hypoglycemia  Glucose monitoring:  is being done several times a day         Glucometer: Dexcom, previously Freestyle libre   CONTINUOUS GLUCOSE MONITORING RECORD INTERPRETATION    Dates of Recording: Last 2 weeks  Sensor description: G6  Results statistics:   CGM use % of time  97  Average and SD  185+/-56  Time in range     55   %, was 54  % Time Above 180  45  % Time above  250  13  % Time Below target  0    Glycemic patterns summary: Blood sugars are fairly consistently high late evening after 9 PM and usually until about 7-8 AM Otherwise blood sugars during the day are relatively well controlled with occasional blood sugar spikes  Hyperglycemic episodes are occurring sporadically midday but fairly consistently overnight with highest blood sugar almost 400 at 2 AM.  Also blood sugars are frequently rising after 8-9 PM  Hypoglycemic episodes have not recurred  Overnight periods: Blood sugars are highest overnight and averaging over 200 between 11 PM and 4 AM and only once blood sugar did come down more quickly after midnight  Preprandial periods: Blood sugars are variably higher at breakfast usually averaging 150-160 and about the same at other meals  Postprandial periods:   After breakfast:   Most blood sugars are fairly well controlled at breakfast After lunch:   Has only 1 significant hypoglycemic episode otherwise blood sugars are flat to lower after the meal After dinner: Glucose is frequently rising after dinner although less in the last week    Previous data:   CGM use % of time  99  Average and SD  182  Time in range      54%  % Time Above 180  46  % Time above 250  9  % Time Below target  0    PRE-MEAL Fasting Lunch Dinner Bedtime Overall  Glucose range:       Mean/median:  167  175  169     POST-MEAL PC Breakfast PC Lunch PC Dinner  Glucose range:     Mean/median:  159  176  176     Glycemic patterns summary: Blood sugar patterns are fairly consistent with hyperglycemia starting around 8-9 PM and continuing until 6-7 AM.  Has more variability in the evenings after 6-7 PM No hypoglycemia  Hyperglycemic episodes usually related in the evenings after about 8 PM and more consistently between midnight-3 AM  Hypoglycemic episodes not present  Overnight periods: Blood sugars are rising fairly significantly after work 10 PM with some  variability and more consistently high between midnight-4 AM and then gradually declining  Preprandial periods: Blood sugars are averaging as above with somewhat more variability at lunchtime and dinnertime and somewhat lower blood sugars in the mornings  Postprandial periods:   After breakfast: Blood sugars are relatively flat postprandially  After lunch: Blood sugars show only mild increase but with some variability After dinner: Blood sugars are increasing less than 10 mg on an average blood there is more variability with blood sugars as high as 266 postprandially  PREVIOUS data: Blood Glucose analysis and averages from freestyle libre  CGM use % of time  100  2-week average/SD  146, was 197  Time in range    82    %  % Time Above 180  16  % Time above 250  1  % Time Below 70  1     PRE-MEAL Fasting Lunch Dinner Bedtime Overall  Glucose range:  139-219      Averages:  138  138  129  150  146   POST-MEAL PC Breakfast PC Lunch PC Dinner  Glucose range:     Averages:  153  136  151     Factors causing Hypoglycemia: Overestimating mealtime coverage, excessive basal rate  Symptoms of hypoglycemia: Some weakness, confusion at times Treatment of hypoglycemia: Corn syrup or jellybeans/other sweets.  She will get 15 g of carbohydrate  Self-care: The diet that the patient has been following is: Carbohydrate counting  Mealtimes are: Breakfast AT 8.30 AM Dinner: 7.30   For breakfast will have toast with cheese or egg, sometimes cereal and egg  Exercise: walking and that time will use her exercise bike        Dietician consultation: Most recent: Years ago .         CDE consultation: 04/2018   Wt Readings from Last 3 Encounters:  09/10/19 166 lb 4 oz (75.4 kg)  08/02/19 166 lb 6.4 oz (75.5 kg)  06/08/19 162 lb 8 oz (73.7 kg)    Lab Results  Component Value Date   HGBA1C 8.4 (H) 09/07/2019   HGBA1C 7.8 (H) 05/14/2019   HGBA1C 8.2 (H) 03/30/2019   Lab Results  Component  Value Date   MICROALBUR 1.2 09/07/2019   LDLCALC 51 09/07/2019   CREATININE 0.99 09/07/2019    Lab Results  Component Value Date   MICRALBCREAT 0.9 09/07/2019   MICRALBCREAT 4.5 08/18/2018     Allergies as of 09/10/2019   No Known Allergies     Medication List  Accurate as of Sep 10, 2019 11:59 PM. If you have any questions, ask your nurse or doctor.        aspirin EC 81 MG tablet Take 1 tablet (81 mg total) by mouth daily.   CALCIUM MAGNESIUM PO Take 1 tablet by mouth daily.   carvedilol 6.25 MG tablet Commonly known as: COREG Take 1 tablet by mouth twice daily with meals.**Please schedule a follow up appointment for additional refills.**   CO Q 10 PO Take 1 capsule by mouth daily.   Dexcom G6 Sensor Misc 1 each by Does not apply route See admin instructions. Use one sensor once every 10 days to monitor blood sugars.   Dexcom G6 Transmitter Misc 1 each by Does not apply route See admin instructions. Use one transmitter once every 90 days to transmit blood sugars.   ezetimibe 10 MG tablet Commonly known as: ZETIA Take 1 tablet by mouth daily.   glucose blood test strip Use Contour Next test strips as instructed to check blood sugar 4 times daily. DX:E10.65   HumaLOG 100 UNIT/ML cartridge Generic drug: insulin lispro Inject 40 Units into the skin daily. Use max of 40 units daily via insulin pump.   isosorbide mononitrate 60 MG 24 hr tablet Commonly known as: IMDUR Take 1/2 tablets by mouth every morning and,, take 1 tablet by mouth every evening.   lisinopril 10 MG tablet Commonly known as: ZESTRIL Take 1 tablet by mouth daily.   multivitamin tablet Take 1 tablet by mouth daily.   nitroGLYCERIN 0.4 MG/SPRAY spray Commonly known as: Nitrolingual Place 1 spray under the tongue every 5 (five) minutes x 3 doses as needed for chest pain (Call 911 if taking 3rd spray).   OmniPod Dash 5 Pack Pods Misc 1 each by Other route See admin instructions.  APPLY ONE POD EVERY 3 DAYS AS DIRECTED   rosuvastatin 40 MG tablet Commonly known as: CRESTOR Take 1 tablet by mouth daily.       Allergies: No Known Allergies  Past Medical History:  Diagnosis Date  . Carotid artery occlusion   . Coronary artery disease    CABG in 1996. Most recent cardiac catheterization in 2013 showed patent grafts including LIMA to LAD, SVG to OM, SVG to diagonal and SVG to PDA.  . Diabetes mellitus without complication ([place])   . Hyperlipidemia   . Hypertension   . PAD (peripheral artery disease) ([place])     Past Surgical History:  Procedure Laterality Date  . CARDIAC CATHETERIZATION    . CORONARY ARTERY BYPASS GRAFT  1996  . EYE SURGERY      Family History  Problem Relation Age of Onset  . Breast cancer Mother 50  . Stroke Mother   . Emphysema Father 74  . Heart attack Father     Social History:  reports that she has never smoked. She has never used smokeless tobacco. She reports that she does not drink alcohol. No history on file for drug.      Review of Systems                                                            Lipids:  She is on Crestor 40 mg daily and since 7/19 also on Zetia  Has history of CAD  several  years ago with coronary bypass LDL is below 70 as follows   Lab Results  Component Value Date   CHOL 131 09/07/2019   HDL 70.30 09/07/2019   LDLCALC 51 09/07/2019   TRIG 47.0 09/07/2019   CHOLHDL 2 09/07/2019    Has had proliferative retinopathy, has been going regularly for exams  Neuropathy with some sensory loss: Forms filled out for diabetic shoes previously  HYPERTENSION:  This is treated with lisinopril 10 mg daily along with low-dose Coreg Previously lisinopril was reduced when blood pressure was low normal Blood pressure was initially high when she came in  BP Readings from Last 3 Encounters:  09/10/19 112/66  08/02/19 138/70  06/08/19 (!) 120/56     Lab Results  Component Value Date   CREATININE  0.99 09/07/2019   CREATININE 0.76 05/14/2019   CREATININE 0.93 04/05/2019    History of Covid infection: No sequelae but she is refusing to consider the Covid vaccine  LABS:  Lab on 09/07/2019  Component Date Value Ref Range Status  . Cholesterol 09/07/2019 131  0 - 200 mg/dL Final   ATP III Classification       Desirable:  < 200 mg/dL               Borderline High:  200 - 239 mg/dL          High:  > = 751 mg/dL  . Triglycerides 09/07/2019 47.0  0.0 - 149.0 mg/dL Final   Normal:  <025 mg/dLBorderline High:  150 - 199 mg/dL  . HDL 09/07/2019 70.30  >39.00 mg/dL Final  . VLDL 85/27/7824 9.4  0.0 - 23.5 mg/dL Final  . LDL Cholesterol 09/07/2019 51  0 - 99 mg/dL Final  . Total CHOL/HDL Ratio 09/07/2019 2   Final                  Men          Women1/2 Average Risk     3.4          3.3Average Risk          5.0          4.42X Average Risk          9.6          7.13X Average Risk          15.0          11.0                      . NonHDL 09/07/2019 60.59   Final   NOTE:  Non-HDL goal should be 30 mg/dL higher than patient's LDL goal (i.e. LDL goal of < 70 mg/dL, would have non-HDL goal of < 100 mg/dL)  . Microalb, Ur 09/07/2019 1.2  0.0 - 1.9 mg/dL Final  . Creatinine,U 36/14/4315 132.3  mg/dL Final  . Microalb Creat Ratio 09/07/2019 0.9  0.0 - 30.0 mg/g Final  . Sodium 09/07/2019 137  135 - 145 mEq/L Final  . Potassium 09/07/2019 4.4  3.5 - 5.1 mEq/L Final  . Chloride 09/07/2019 102  96 - 112 mEq/L Final  . CO2 09/07/2019 32  19 - 32 mEq/L Final  . Glucose, Bld 09/07/2019 131* 70 - 99 mg/dL Final  . BUN 40/11/6759 37* 6 - 23 mg/dL Final  . Creatinine, Ser 09/07/2019 0.99  0.40 - 1.20 mg/dL Final  . Total Bilirubin 09/07/2019 0.4  0.2 - 1.2 mg/dL Final  . Alkaline Phosphatase  09/07/2019 72  39 - 117 U/L Final  . AST 09/07/2019 20  0 - 37 U/L Final  . ALT 09/07/2019 20  0 - 35 U/L Final  . Total Protein 09/07/2019 5.9* 6.0 - 8.3 g/dL Final  . Albumin 53/97/6734 3.9  3.5 - 5.2 g/dL Final    . GFR 19/37/9024 54.32* >60.00 mL/min Final  . Calcium 09/07/2019 9.3  8.4 - 10.5 mg/dL Final  . Hgb O9B MFr Bld 09/07/2019 8.4* 4.6 - 6.5 % Final   Glycemic Control Guidelines for People with Diabetes:Non Diabetic:  <6%Goal of Therapy: <7%Additional Action Suggested:  >8%      Physical Examination:  BP 112/66   Pulse 81   Ht 5\' 4"  (1.626 m)   Wt 166 lb 4 oz (75.4 kg)   SpO2 96%   BMI 28.54 kg/m      ASSESSMENT:  Diabetes type 1, long-standing  A1c is higher at 8.4  See history of present illness for analysis of current diabetes management, blood sugar patterns and problems identified  She is on an Omni pod insulin pump with Humalog insulin and now using the Dexcom  Even with increasing her nighttime basal rates her blood sugar patterns and timing in range is about the same Not clear why she is more insulin resistant overnight Previously afraid of hypoglycemia but this is not occurring Likely also has higher postprandial readings after dinner Occasionally will have higher fat meals but not consistently Exercise is minimal except for some gardening Also more weight gain   Hypertension: Her blood pressure is excellent with 10 mg lisinopril and no orthostatic symptoms Renal function is stable  Normal microalbumin  LIPIDS: Well controlled with LDL below 70    PLAN:   Basal rates will be increased to as follows Midnight = 0.6 and 9 PM will be 0.7 Carbohydrate coverage 1: 6 at dinner now Correction factor I: 40 instead of 50, this will provide extra insulin when she needs for better correction of high readings We will avoid increasing her overnight basal rate much because of potential for hypoglycemia However will eventually benefit from the closed-loop pump when it is available  She will call if she has any further issues with continued hyperglycemia  Discussed benefits and safety of Covid vaccine and given patient information to take home also, strongly  encouraged her to take this    There are no Patient Instructions on file for this visit.     09/11/2019, 9:29 AM   Note: This note was prepared with Dragon voice recognition system technology. Any transcriptional errors that result from this process are unintentional.

## 2019-09-11 ENCOUNTER — Encounter: Payer: Self-pay | Admitting: Endocrinology

## 2019-09-12 ENCOUNTER — Ambulatory Visit (INDEPENDENT_AMBULATORY_CARE_PROVIDER_SITE_OTHER): Payer: Medicare Other | Admitting: Podiatry

## 2019-09-12 ENCOUNTER — Other Ambulatory Visit: Payer: Self-pay

## 2019-09-12 ENCOUNTER — Encounter: Payer: Self-pay | Admitting: Podiatry

## 2019-09-12 DIAGNOSIS — E1142 Type 2 diabetes mellitus with diabetic polyneuropathy: Secondary | ICD-10-CM | POA: Diagnosis not present

## 2019-09-12 DIAGNOSIS — L84 Corns and callosities: Secondary | ICD-10-CM

## 2019-09-12 DIAGNOSIS — M79675 Pain in left toe(s): Secondary | ICD-10-CM | POA: Diagnosis not present

## 2019-09-12 DIAGNOSIS — M2042 Other hammer toe(s) (acquired), left foot: Secondary | ICD-10-CM

## 2019-09-12 DIAGNOSIS — M2041 Other hammer toe(s) (acquired), right foot: Secondary | ICD-10-CM | POA: Diagnosis not present

## 2019-09-12 DIAGNOSIS — M79674 Pain in right toe(s): Secondary | ICD-10-CM

## 2019-09-12 DIAGNOSIS — M216X1 Other acquired deformities of right foot: Secondary | ICD-10-CM | POA: Diagnosis not present

## 2019-09-12 DIAGNOSIS — B351 Tinea unguium: Secondary | ICD-10-CM | POA: Diagnosis not present

## 2019-09-12 NOTE — Patient Instructions (Signed)
Diabetes Mellitus and Foot Care Foot care is an important part of your health, especially when you have diabetes. Diabetes may cause you to have problems because of poor blood flow (circulation) to your feet and legs, which can cause your skin to:  Become thinner and drier.  Break more easily.  Heal more slowly.  Peel and crack. You may also have nerve damage (neuropathy) in your legs and feet, causing decreased feeling in them. This means that you may not notice minor injuries to your feet that could lead to more serious problems. Noticing and addressing any potential problems early is the best way to prevent future foot problems. How to care for your feet Foot hygiene  Wash your feet daily with warm water and mild soap. Do not use hot water. Then, pat your feet and the areas between your toes until they are completely dry. Do not soak your feet as this can dry your skin.  Trim your toenails straight across. Do not dig under them or around the cuticle. File the edges of your nails with an emery board or nail file.  Apply a moisturizing lotion or petroleum jelly to the skin on your feet and to dry, brittle toenails. Use lotion that does not contain alcohol and is unscented. Do not apply lotion between your toes. Shoes and socks  Wear clean socks or stockings every day. Make sure they are not too tight. Do not wear knee-high stockings since they may decrease blood flow to your legs.  Wear shoes that fit properly and have enough cushioning. Always look in your shoes before you put them on to be sure there are no objects inside.  To break in new shoes, wear them for just a few hours a day. This prevents injuries on your feet. Wounds, scrapes, corns, and calluses  Check your feet daily for blisters, cuts, bruises, sores, and redness. If you cannot see the bottom of your feet, use a mirror or ask someone for help.  Do not cut corns or calluses or try to remove them with medicine.  If you  find a minor scrape, cut, or break in the skin on your feet, keep it and the skin around it clean and dry. You may clean these areas with mild soap and water. Do not clean the area with peroxide, alcohol, or iodine.  If you have a wound, scrape, corn, or callus on your foot, look at it several times a day to make sure it is healing and not infected. Check for: ? Redness, swelling, or pain. ? Fluid or blood. ? Warmth. ? Pus or a bad smell. General instructions  Do not cross your legs. This may decrease blood flow to your feet.  Do not use heating pads or hot water bottles on your feet. They may burn your skin. If you have lost feeling in your feet or legs, you may not know this is happening until it is too late.  Protect your feet from hot and cold by wearing shoes, such as at the beach or on hot pavement.  Schedule a complete foot exam at least once a year (annually) or more often if you have foot problems. If you have foot problems, report any cuts, sores, or bruises to your health care provider immediately. Contact a health care provider if:  You have a medical condition that increases your risk of infection and you have any cuts, sores, or bruises on your feet.  You have an injury that is not   healing.  You have redness on your legs or feet.  You feel burning or tingling in your legs or feet.  You have pain or cramps in your legs and feet.  Your legs or feet are numb.  Your feet always feel cold.  You have pain around a toenail. Get help right away if:  You have a wound, scrape, corn, or callus on your foot and: ? You have pain, swelling, or redness that gets worse. ? You have fluid or blood coming from the wound, scrape, corn, or callus. ? Your wound, scrape, corn, or callus feels warm to the touch. ? You have pus or a bad smell coming from the wound, scrape, corn, or callus. ? You have a fever. ? You have a red line going up your leg. Summary  Check your feet every day  for cuts, sores, red spots, swelling, and blisters.  Moisturize feet and legs daily.  Wear shoes that fit properly and have enough cushioning.  If you have foot problems, report any cuts, sores, or bruises to your health care provider immediately.  Schedule a complete foot exam at least once a year (annually) or more often if you have foot problems. This information is not intended to replace advice given to you by your health care provider. Make sure you discuss any questions you have with your health care provider. Document Revised: 12/27/2018 Document Reviewed: 05/07/2016 Elsevier Patient Education  2020 Elsevier Inc.  

## 2019-09-14 ENCOUNTER — Other Ambulatory Visit: Payer: Self-pay

## 2019-09-14 ENCOUNTER — Other Ambulatory Visit: Payer: Medicare Other | Admitting: Orthotics

## 2019-09-17 NOTE — Progress Notes (Signed)
Subjective: Natasha Hernandez presents today at risk foot care. Pt has h/o NIDDM with PAD and painful callus(es) right foot and painful mycotic toenails b/l that are difficult to trim. Pain interferes with ambulation. Aggravating factors include wearing enclosed shoe gear. Pain is relieved with periodic professional debridement.   Her granddaughter, Natasha Hernandez, is present during today's visit.  Ms. Buffone is concerned about her diabetic inserts for her right foot and would like an appointment with our Orthotics/Prosthetics Dept. She feels her right ankle is turning in and unstable when she is walking.  She is followed by Dr. Fletcher Anon for Cardiology and Dr. Dwyane Dee for Endocrinology.  Natasha Hernandez, Natasha Nunnery, MD is patient's PCP.  Past Medical History:  Diagnosis Date  . Carotid artery occlusion   . Coronary artery disease    CABG in 1996. Most recent cardiac catheterization in 2013 showed patent grafts including LIMA to LAD, SVG to OM, SVG to diagonal and SVG to PDA.  . Diabetes mellitus without complication ([place])   . Hyperlipidemia   . Hypertension   . PAD (peripheral artery disease) ([place])      Current Outpatient Medications on File Prior to Visit  Medication Sig Dispense Refill  . aspirin EC 81 MG tablet Take 1 tablet (81 mg total) by mouth daily. 90 tablet 3  . Calcium-Magnesium-Vitamin D (CALCIUM MAGNESIUM PO) Take 1 tablet by mouth daily.    . carvedilol (COREG) 6.25 MG tablet Take 1 tablet by mouth twice daily with meals.**Please schedule a follow up appointment for additional refills.** 60 tablet 3  . Coenzyme Q10 (CO Q 10 PO) Take 1 capsule by mouth daily.    . Continuous Blood Gluc Sensor (DEXCOM G6 SENSOR) MISC 1 each by Does not apply route See admin instructions. Use one sensor once every 10 days to monitor blood sugars.    . Continuous Blood Gluc Transmit (DEXCOM G6 TRANSMITTER) MISC 1 each by Does not apply route See admin instructions. Use one transmitter once every 90 days to transmit  blood sugars.    Marland Kitchen ezetimibe (ZETIA) 10 MG tablet Take 1 tablet by mouth daily. 90 tablet 1  . glucose blood test strip Use Contour Next test strips as instructed to check blood sugar 4 times daily. DX:E10.65 150 each 1  . Insulin Disposable Pump (OMNIPOD DASH 5 PACK PODS) MISC 1 each by Other route See admin instructions. APPLY ONE POD EVERY 3 DAYS AS DIRECTED 10 each 11  . insulin lispro (HUMALOG) 100 UNIT/ML cartridge Inject 40 Units into the skin daily. Use max of 40 units daily via insulin pump.    . isosorbide mononitrate (IMDUR) 60 MG 24 hr tablet Take 1/2 tablets by mouth every morning and,, take 1 tablet by mouth every evening. 45 tablet 3  . lisinopril (ZESTRIL) 10 MG tablet Take 1 tablet by mouth daily. 90 tablet 1  . Multiple Vitamin (MULTIVITAMIN) tablet Take 1 tablet by mouth daily.    . nitroGLYCERIN (NITROLINGUAL) 0.4 MG/SPRAY spray Place 1 spray under the tongue every 5 (five) minutes x 3 doses as needed for chest pain (Call 911 if taking 3rd spray). 4.9 g 1  . rosuvastatin (CRESTOR) 40 MG tablet Take 1 tablet by mouth daily. 90 tablet 1   No current facility-administered medications on file prior to visit.     No Known Allergies  Objective: Natasha Hernandez is a pleasant 79 y.o. y.o. Patient Race: White or Caucasian [1]  female in NAD. AAO x 3.  There were no vitals  filed for this visit.  Vascular Examination: Neurovascular status unchanged b/l lower extremities. Capillary refill time to digits immediate b/l. Palpable DP pulses b/l. Palpable PT pulses b/l. Pedal hair present b/l. Skin temperature gradient within normal limits b/l.  Dermatological Examination: Pedal skin is thin shiny, atrophic bilaterally. No open wounds bilaterally. No interdigital macerations bilaterally. Toenails 1-5 b/l elongated, discolored, dystrophic, thickened, crumbly with subungual debris and tenderness to dorsal palpation. Hyperkeratotic lesion(s) submet head 1 right foot and submet head 5  right foot.  No erythema, no edema, no drainage, no flocculence.   Callus plantar left foot has resolved.  Musculoskeletal: Normal muscle strength 5/5 to all lower extremity muscle groups bilaterally. No pain crepitus or joint limitation noted with ROM b/l. Hammertoes noted to the 1-5 bilaterally.   Fixed inversion deformity right ankle. Decreased eversion right foot.  Neurological Examination: Protective sensation diminished with 10g monofilament b/l. Vibratory sensation intact b/l. Proprioception intact bilaterally. Babinski reflex negative b/l. Clonus negative b/l.  Assessment: 1. Pain due to onychomycosis of toenails of both feet   2. Callus   3. Inversion deformity of right foot   4. Acquired hammertoes of both feet   5. Diabetic peripheral neuropathy associated with type 2 diabetes mellitus (HCC)   Plan: -Examined patient. -Ms. Dockter to be scheduled with [facility] this Friday for evaluation of inserts/shoes as well as bracing for RLE for inversion deformity of right ankle. She was asked to bring in all of her shoes/inserts for modification.  -Continue diabetic foot care principles. Literature dispensed on today.  -Toenails 1-5 b/l were debrided in length and girth with sterile nail nippers and dremel without iatrogenic bleeding.  -Patient to continue soft, supportive shoe gear daily. -Patient to report any pedal injuries to medical professional immediately. -Patient/POA to call should there be question/concern in the interim.  Return in about 3 months (around 12/13/2019) for diabetic nail and callus trim.  Freddie Breech, DPM

## 2019-10-04 ENCOUNTER — Telehealth: Payer: Self-pay

## 2019-10-04 NOTE — Telephone Encounter (Signed)
FAXED DOCUMENTS  Company: [facility] (faxed on 10/02/2019)  Document: Request for diabetic shoes Other records requested: none at this time.  All above requested information has been faxed successfully to [employer] listed above. Documents and fax confirmation have been placed in the faxed file for future reference.

## 2019-10-10 ENCOUNTER — Telehealth: Payer: Self-pay

## 2019-10-10 NOTE — Telephone Encounter (Signed)
FAXED DOCUMENTS  Company: [employer] (faxed on 10/05/19)  Document: Prescription for CGM Other records requested: none at this time.  All above requested information has been faxed successfully to [employer] listed above. Documents and fax confirmation have been placed in the faxed file for future reference.

## 2019-10-13 ENCOUNTER — Ambulatory Visit (HOSPITAL_COMMUNITY)
Admission: EM | Admit: 2019-10-13 | Discharge: 2019-10-13 | Disposition: A | Discharge status: Home / Self Care | Payer: Medicare Other | Attending: Family Medicine | Admitting: Family Medicine

## 2019-10-13 ENCOUNTER — Encounter (HOSPITAL_COMMUNITY): Payer: Self-pay

## 2019-10-13 ENCOUNTER — Other Ambulatory Visit: Payer: Self-pay

## 2019-10-13 DIAGNOSIS — L259 Unspecified contact dermatitis, unspecified cause: Secondary | ICD-10-CM | POA: Diagnosis not present

## 2019-10-13 DIAGNOSIS — R21 Rash and other nonspecific skin eruption: Secondary | ICD-10-CM | POA: Diagnosis not present

## 2019-10-13 DIAGNOSIS — L509 Urticaria, unspecified: Secondary | ICD-10-CM

## 2019-10-13 MED ORDER — PREDNISONE 10 MG (21) PO TBPK: ORAL_TABLET | Freq: Every day | ORAL | 0 refills | Status: AC

## 2019-10-13 MED ORDER — TRIAMCINOLONE ACETONIDE 0.1 % EX CREA
1.0000 | TOPICAL_CREAM | Freq: Two times a day (BID) | CUTANEOUS | 0 refills | Status: DC
Start: 2019-10-13 — End: 2021-05-22

## 2019-10-13 NOTE — Discharge Instructions (Signed)
I have sent in some steroid cream for you to use three times a day  I have sent in a prednisone taper for you to take for 6 days. 6 tablets on day one, 5 tablets on day two, 4 tablets on day three, 3 tablets on day four, 2 tablets on day five, and 1 tablet on day six.  Follow up with primary care or with this office if symptoms are not improving

## 2019-10-13 NOTE — ED Triage Notes (Signed)
Pt presents with rash on shoulder area and upper arms X 3 weeks with no relief.

## 2019-10-17 ENCOUNTER — Other Ambulatory Visit: Payer: Self-pay | Admitting: Cardiovascular Disease

## 2019-10-17 NOTE — ED Provider Notes (Signed)
[facility]   [id number] 10/13/19 Arrival Time: 1404  CC: RASH  SUBJECTIVE:  Natasha Hernandez is a 79 y.o. female who presents with a skin complaint that began 3 weeks ago.  Reports rash to bilateral shoulders and upper arms.  Has used hydrocortisone cream with no relief.  Has not tried other over-the-counter medications for this.  Denies precipitating event or trauma.  Denies changes in soaps, detergents, close contacts with similar rash, known trigger or environmental trigger, allergy. Denies medications change or starting a new medication recently. Describes it as very itchy.  . There are no aggravating or alleviating factors. Denies similar symptoms in the past. Denies fever, chills, nausea, vomiting, erythema, swelling, discharge, oral lesions, SOB, chest pain, abdominal pain, changes in bowel or bladder function.    ROS: As per HPI.  All other pertinent ROS negative.     Past Medical History:  Diagnosis Date  . Carotid artery occlusion   . Coronary artery disease    CABG in 1996. Most recent cardiac catheterization in 2013 showed patent grafts including LIMA to LAD, SVG to OM, SVG to diagonal and SVG to PDA.  . Diabetes mellitus without complication (HCC)   . Hyperlipidemia   . Hypertension   . PAD (peripheral artery disease) (HCC)    Past Surgical History:  Procedure Laterality Date  . CARDIAC CATHETERIZATION    . CORONARY ARTERY BYPASS GRAFT  1996  . EYE SURGERY     No Known Allergies No current facility-administered medications on file prior to encounter.   Current Outpatient Medications on File Prior to Encounter  Medication Sig Dispense Refill  . aspirin EC 81 MG tablet Take 1 tablet (81 mg total) by mouth daily. 90 tablet 3  . Calcium-Magnesium-Vitamin D (CALCIUM MAGNESIUM PO) Take 1 tablet by mouth daily.    . Coenzyme Q10 (CO Q 10 PO) Take 1 capsule by mouth daily.    . Continuous Blood Gluc Sensor (DEXCOM G6 SENSOR) MISC 1 each by Does not apply route  See admin instructions. Use one sensor once every 10 days to monitor blood sugars.    . Continuous Blood Gluc Transmit (DEXCOM G6 TRANSMITTER) MISC 1 each by Does not apply route See admin instructions. Use one transmitter once every 90 days to transmit blood sugars.    Marland Kitchen ezetimibe (ZETIA) 10 MG tablet Take 1 tablet by mouth daily. 90 tablet 1  . glucose blood test strip Use Contour Next test strips as instructed to check blood sugar 4 times daily. DX:E10.65 150 each 1  . Insulin Disposable Pump (OMNIPOD DASH 5 PACK PODS) MISC 1 each by Other route See admin instructions. APPLY ONE POD EVERY 3 DAYS AS DIRECTED 10 each 11  . insulin lispro (HUMALOG) 100 UNIT/ML cartridge Inject 40 Units into the skin daily. Use max of 40 units daily via insulin pump.    Marland Kitchen lisinopril (ZESTRIL) 10 MG tablet Take 1 tablet by mouth daily. 90 tablet 1  . Multiple Vitamin (MULTIVITAMIN) tablet Take 1 tablet by mouth daily.    . nitroGLYCERIN (NITROLINGUAL) 0.4 MG/SPRAY spray Place 1 spray under the tongue every 5 (five) minutes x 3 doses as needed for chest pain (Call 911 if taking 3rd spray). 4.9 g 1  . rosuvastatin (CRESTOR) 40 MG tablet Take 1 tablet by mouth daily. 90 tablet 1   Social History   Socioeconomic History  . Marital status: Widowed    Spouse name: Not on file  . Number of children: Not on  file  . Years of education: Not on file  . Highest education level: Not on file  Occupational History  . Occupation: Retired  Tobacco Use  . Smoking status: Never Smoker  . Smokeless tobacco: Never Used  Vaping Use  . Vaping Use: Never used  Substance and Sexual Activity  . Alcohol use: No  . Drug use: Not on file  . Sexual activity: Not on file  Other Topics Concern  . Not on file  Social History Narrative   Pt lives w/ son, dtr-in-law and grandchildren.    Social Determinants of Health   Financial Resource Strain:   . Difficulty of Paying Living Expenses:   Food Insecurity:   . Worried About  Programme researcher, broadcasting/film/video in the Last Year:   . Barista in the Last Year:   Transportation Needs:   . Freight forwarder (Medical):   Marland Kitchen Lack of Transportation (Non-Medical):   Physical Activity:   . Days of Exercise per Week:   . Minutes of Exercise per Session:   Stress:   . Feeling of Stress :   Social Connections:   . Frequency of Communication with Friends and Family:   . Frequency of Social Gatherings with Friends and Family:   . Attends Religious Services:   . Active Member of Clubs or Organizations:   . Attends Banker Meetings:   Marland Kitchen Marital Status:   Intimate Partner Violence:   . Fear of Current or Ex-Partner:   . Emotionally Abused:   Marland Kitchen Physically Abused:   . Sexually Abused:    Family History  Problem Relation Age of Onset  . Breast cancer Mother 57  . Stroke Mother   . Emphysema Father 40  . Heart attack Father     OBJECTIVE: Vitals:   10/13/19 1418  BP: (!) 132/48  Pulse: 85  Resp: 17  Temp: 98.8 F (37.1 C)  TempSrc: Oral  SpO2: 97%    General appearance: alert; no distress Head: NCAT Lungs: clear to auscultation bilaterally Heart: regular rate and rhythm.  Radial pulse 2+ bilaterally Extremities: no edema Skin: warm and dry; maculopapular erythematous rash to both shoulders and upper arms Psychological: alert and cooperative; normal mood and affect  ASSESSMENT & PLAN:  1. Rash   2. Urticaria   3. Contact dermatitis, unspecified contact dermatitis type, unspecified trigger     Meds ordered this encounter  Medications  . triamcinolone cream (KENALOG) 0.1 %    Sig: Apply 1 application topically 2 (two) times daily.    Dispense:  30 g    Refill:  0    Order Specific Question:   Supervising Provider    Answer:   Merrilee Jansky [id number]  . predniSONE (STERAPRED UNI-PAK 21 TAB) 10 MG (21) TBPK tablet    Sig: Take by mouth daily for 6 days. Take 6 tablets on day 1, 5 tablets on day 2, 4 tablets on day 3, 3 tablets on day  4, 2 tablets on day 5, 1 tablet on day 6    Dispense:  21 tablet    Refill:  0    Order Specific Question:   Supervising Provider    Answer:   Merrilee Jansky [[id number]]     May use Zyrtec over-the-counter to help with itching rash Triamcinolone 0.1% (corticosteroid - itch/ inflammation relief) Take as prescribed and to completion Avoid hot showers/ baths Moisturize skin daily  Follow up with PCP if symptoms persists Return or  go to the ER if you have any new or worsening symptoms such as fever, chills, nausea, vomiting, redness, swelling, discharge, if symptoms do not improve with medications  Reviewed expectations re: course of current medical issues. Questions answered. Outlined signs and symptoms indicating need for more acute intervention. Patient verbalized understanding. After Visit Summary given.   Moshe Cipro, NP 10/17/19 1320

## 2019-11-05 ENCOUNTER — Other Ambulatory Visit: Payer: Self-pay

## 2019-11-05 ENCOUNTER — Other Ambulatory Visit (INDEPENDENT_AMBULATORY_CARE_PROVIDER_SITE_OTHER): Payer: Medicare Other

## 2019-11-05 DIAGNOSIS — E1065 Type 1 diabetes mellitus with hyperglycemia: Secondary | ICD-10-CM | POA: Diagnosis not present

## 2019-11-05 LAB — BASIC METABOLIC PANEL
BUN: 21 mg/dL (ref 6–23)
CO2: 32 mEq/L (ref 19–32)
Calcium: 9.3 mg/dL (ref 8.4–10.5)
Chloride: 102 mEq/L (ref 96–112)
Creatinine, Ser: 0.86 mg/dL (ref 0.40–1.20)
GFR: 63.88 mL/min (ref >60.00)
Glucose, Bld: 195 mg/dL — ABNORMAL HIGH (ref 70–99)
Potassium: 4.5 mEq/L (ref 3.5–5.1)
Sodium: 138 mEq/L (ref 135–145)

## 2019-11-05 LAB — HEMOGLOBIN A1C: Hgb A1c MFr Bld: 8.4 % — ABNORMAL HIGH (ref 4.6–6.5)

## 2019-11-09 ENCOUNTER — Encounter: Payer: Self-pay | Admitting: Endocrinology

## 2019-11-09 ENCOUNTER — Other Ambulatory Visit: Payer: Self-pay

## 2019-11-09 ENCOUNTER — Ambulatory Visit (INDEPENDENT_AMBULATORY_CARE_PROVIDER_SITE_OTHER): Payer: Medicare Other | Admitting: Endocrinology

## 2019-11-09 VITALS — BP 110/64 | HR 79 | Ht 64.0 in | Wt 167.6 lb

## 2019-11-09 DIAGNOSIS — I6521 Occlusion and stenosis of right carotid artery: Secondary | ICD-10-CM

## 2019-11-09 DIAGNOSIS — I10 Essential (primary) hypertension: Secondary | ICD-10-CM

## 2019-11-09 DIAGNOSIS — E1065 Type 1 diabetes mellitus with hyperglycemia: Secondary | ICD-10-CM

## 2019-11-09 NOTE — Patient Instructions (Signed)
Basal at 9 pm is 0.8 units

## 2019-11-09 NOTE — Progress Notes (Signed)
Patient ID: Natasha Hernandez, female   DOB: 1940-04-23, 79 y.o.   MRN: [id number]            Reason for Appointment : Follow-up for Type 1 Diabetes    History of Present Illness          Diagnosis: Type 1 diabetes mellitus, date of diagnosis: 1953          Previous history:  She thinks she has been on various insulin regimens over the years but has been on Lantus for 10-12 years along with mealtime insulin Previously followed by an endocrinologist but no records available. Her A1c has usually been around 7% reportedly and was 7.2 in 7/16   Recent history:    INSULIN PUMP settings BASAL rate midnight = 0.6, 5 AM = 0. 55.  12 PM = 0.70.  6 PM = 0.6 and 9 PM = 0.65 with total basal 15 units  Carbohydrate coverage 1: 7 breakfast and dinner, 1: 6 at lunch, sensitivity 1: 60 and active insulin 4 hours. Blood sugar target 135-140  A1c is again 8.4  Current management, blood sugar patterns and problems identified:     Blood sugars are more often in range and average blood sugars are better and compared to her last visit  Blood sugar patterns with her CGM are interpreted as follows  HYPOGLYCEMIA has been minimal and only once around 8-9 AM transiently  HYPERGLYCEMIA is occurring frequently overnight in the first week and also at least on 4 nights in the second week especially before 3 AM  Also has hyperglycemia sporadically midday  In the last week postprandial readings after dinner went up only once or twice but were fairly consistently higher in week #1  Blood sugars are usually well controlled after LUNCH  However blood sugars after breakfast are generally not rising and glucose was relatively lower after breakfast this morning for no apparent reason  She has usually try to assess her carbohydrate intake and bolus before starting to eat Average blood sugar during the night is starting at 200 and usually staying over 150 until early morning  Postprandial hyperglycemia is  difficult to control consistently especially at dinnertime, likely from inherent variability and periodically higher fat meals since she is eating out a lot  Glucose monitoring:  is being done several times a day         Glucometer: Dexcom, previously Freestyle libre  CGM use % of time  98  2-week average/SD  173, was 185  Time in range      64% was 55  % Time Above 180  36  % Time above 250  7  % Time Below 70  0.2   Previous data:   CGM use % of time  97  Average and SD  185+/-56  Time in range     55   %, was 54  % Time Above 180  45  % Time above 250  13  % Time Below target  0    Glycemic patterns summary: Blood sugars are fairly consistently high late evening after 9 PM and usually until about 7-8 AM Otherwise blood sugars during the day are relatively well controlled with occasional blood sugar spikes  Factors causing Hypoglycemia: Overestimating mealtime coverage, excessive basal rate  Symptoms of hypoglycemia: Some weakness, confusion at times Treatment of hypoglycemia: Corn syrup or jellybeans/other sweets.  She will get 15 g of carbohydrate  Self-care: The diet that the patient has been following  is: Carbohydrate counting  Mealtimes are: Breakfast AT 8.30 AM Dinner: 7.30   For breakfast will have toast with cheese or egg, sometimes cereal and egg  Exercise: walking and that time will use her exercise bike        Dietician consultation: Most recent: Years ago .         CDE consultation: 04/2018   Wt Readings from Last 3 Encounters:  11/09/19 167 lb 9.6 oz (76 kg)  09/10/19 166 lb 4 oz (75.4 kg)  08/02/19 166 lb 6.4 oz (75.5 kg)    Lab Results  Component Value Date   HGBA1C 8.4 (H) 11/05/2019   HGBA1C 8.4 (H) 09/07/2019   HGBA1C 7.8 (H) 05/14/2019   Lab Results  Component Value Date   MICROALBUR 1.2 09/07/2019   LDLCALC 51 09/07/2019   CREATININE 0.86 11/05/2019    Lab Results  Component Value Date   MICRALBCREAT 0.9 09/07/2019   MICRALBCREAT 4.5  08/18/2018     Allergies as of 11/09/2019   Not on File     Medication List       Accurate as of November 09, 2019 10:26 AM. If you have any questions, ask your nurse or doctor.        aspirin EC 81 MG tablet Take 1 tablet (81 mg total) by mouth daily.   CALCIUM MAGNESIUM PO Take 1 tablet by mouth daily.   carvedilol 6.25 MG tablet Commonly known as: COREG Take 1 tablet (6.25 mg total) by mouth 2 (two) times daily with a meal.   CO Q 10 PO Take 1 capsule by mouth daily.   Dexcom G6 Sensor Misc 1 each by Does not apply route See admin instructions. Use one sensor once every 10 days to monitor blood sugars.   Dexcom G6 Transmitter Misc 1 each by Does not apply route See admin instructions. Use one transmitter once every 90 days to transmit blood sugars.   ezetimibe 10 MG tablet Commonly known as: ZETIA Take 1 tablet by mouth daily.   glucose blood test strip Use Contour Next test strips as instructed to check blood sugar 4 times daily. DX:E10.65   HumaLOG 100 UNIT/ML cartridge Generic drug: insulin lispro Inject 40 Units into the skin daily. Use max of 40 units daily via insulin pump.   isosorbide mononitrate 60 MG 24 hr tablet Commonly known as: IMDUR Take 1/2 tablets by mouth every morning and take 1 tablet by mouth every evening.   lisinopril 10 MG tablet Commonly known as: ZESTRIL Take 1 tablet by mouth daily.   multivitamin tablet Take 1 tablet by mouth daily.   nitroGLYCERIN 0.4 MG/SPRAY spray Commonly known as: Nitrolingual Place 1 spray under the tongue every 5 (five) minutes x 3 doses as needed for chest pain (Call 911 if taking 3rd spray).   OmniPod Dash 5 Pack Pods Misc 1 each by Other route See admin instructions. APPLY ONE POD EVERY 3 DAYS AS DIRECTED   rosuvastatin 40 MG tablet Commonly known as: CRESTOR Take 1 tablet by mouth daily.   triamcinolone cream 0.1 % Commonly known as: KENALOG Apply 1 application topically 2 (two) times daily.         Allergies: Not on File  Past Medical History:  Diagnosis Date   Carotid artery occlusion    Coronary artery disease    CABG in 1996. Most recent cardiac catheterization in 2013 showed patent grafts including LIMA to LAD, SVG to OM, SVG to diagonal and SVG to PDA.  Diabetes mellitus without complication (HCC)    Hyperlipidemia    Hypertension    PAD (peripheral artery disease) (HCC)     Past Surgical History:  Procedure Laterality Date   CARDIAC CATHETERIZATION     CORONARY ARTERY BYPASS GRAFT  1996   EYE SURGERY      Family History  Problem Relation Age of Onset   Breast cancer Mother 81   Stroke Mother    Emphysema Father 11   Heart attack Father     Social History:  reports that she has never smoked. She has never used smokeless tobacco. She reports that she does not drink alcohol. No history on file for drug use.      Review of Systems                                                            Lipids:  She is on Crestor 40 mg daily and since 7/19 also on Zetia  Has history of CAD  several years ago with coronary bypass LDL is below 70 as follows   Lab Results  Component Value Date   CHOL 131 09/07/2019   HDL 70.30 09/07/2019   LDLCALC 51 09/07/2019   TRIG 47.0 09/07/2019   CHOLHDL 2 09/07/2019    Has had proliferative retinopathy, has been going regularly for exams  Neuropathy with some sensory loss: Forms filled out for diabetic shoes previously  HYPERTENSION:  This is treated with lisinopril 10 mg daily along with low-dose Coreg Previously lisinopril was reduced when blood pressure was low normal Blood pressure was initially low when she came in but better when standing up  BP Readings from Last 3 Encounters:  11/09/19 (!) 110/64  10/13/19 (!) 132/48  09/10/19 112/66     Lab Results  Component Value Date   CREATININE 0.86 11/05/2019   CREATININE 0.99 09/07/2019   CREATININE 0.76 05/14/2019    History of Covid  infection: No sequelae but she is steadfastly refusing to consider the Covid vaccine Discussed importance of enhanced protection with the vaccine especially with a dangerous delta variant and reassured her by demonstrating the manufacturing process of the vaccine that it is very safe.  She has generally been against vaccines  LABS:  Lab on 11/05/2019  Component Date Value Ref Range Status   Sodium 11/05/2019 138  135 - 145 mEq/L Final   Potassium 11/05/2019 4.5  3.5 - 5.1 mEq/L Final   Chloride 11/05/2019 102  96 - 112 mEq/L Final   CO2 11/05/2019 32  19 - 32 mEq/L Final   Glucose, Bld 11/05/2019 195* 70 - 99 mg/dL Final   BUN 25/36/6440 21  6 - 23 mg/dL Final   Creatinine, Ser 11/05/2019 0.86  0.40 - 1.20 mg/dL Final   GFR 34/74/2595 63.88  >60.00 mL/min Final   Calcium 11/05/2019 9.3  8.4 - 10.5 mg/dL Final   Hgb G3O MFr Bld 11/05/2019 8.4* 4.6 - 6.5 % Final   Glycemic Control Guidelines for People with Diabetes:Non Diabetic:  <6%Goal of Therapy: <7%Additional Action Suggested:  >8%      Physical Examination:  BP (!) 110/64 (BP Location: Left Arm, Patient Position: Sitting, Cuff Size: Normal)    Pulse 79    Ht 5\' 4"  (1.626 m)    Wt 167 lb 9.6  oz (76 kg)    SpO2 93%    BMI 28.77 kg/m    Standing blood pressure on the second attempt was 125/68  ASSESSMENT:  Diabetes type 1, long-standing  A1c is still not controlled at 8.4  See history of present illness for analysis of current diabetes management, blood sugar patterns and problems identified  She is on an Omni pod insulin pump with Humalog insulin and using the Dexcom  Blood sugars are showing significant variability including week to week However recently in the last 2 weeks time in target is better than compared to previous visits, just below target range of 70%  She has mostly tendency to rising blood sugar late at night However she is still concerned about low blood sugars overnight even though Average blood  sugar during the night is starting at 200 and usually staying over 150 until early morning  Postprandial hyperglycemia is difficult to control consistently especially at dinnertime, likely from inherent variability and periodically higher fat meals since she is eating out a lot Likely should not increase her basal rates overnight again because of variability and potential for hypoglycemia considering her age and duration of diabetes  Hypertension: Her blood pressure is appearing normal with 10 mg lisinopril Initially had relatively lower blood pressure but better when checked by myself No orthostatic symptoms  Renal function is stable    PLAN:   Basal rates will be increased to as follows  9 PM will be 0.8 Carbohydrate coverage 1: 6 at dinner now Consider increasing carb coverage at dinnertime if blood sugars are more consistently higher in the evenings She may also benefit from there is a lower fat meals  She will eventually benefit from the closed-loop pump when it is available to reduce variability  She will call if she has any further issues with continued hyperglycemia  Discussed with her and her daughter the details of Covid vaccine and given patient information to take home again, strongly encouraged her to take this  Patient Instructions  Basal at 9 pm is 0.8 units    There are no Patient Instructions on file for this visit.     Reather Littler 11/09/2019, 10:26 AM   Note: This note was prepared with Dragon voice recognition system technology. Any transcriptional errors that result from this process are unintentional.

## 2019-11-13 ENCOUNTER — Other Ambulatory Visit: Payer: Self-pay | Admitting: Endocrinology

## 2019-11-14 ENCOUNTER — Other Ambulatory Visit: Payer: Self-pay

## 2019-11-14 MED ORDER — HUMALOG 100 UNIT/ML ~~LOC~~ SOCT: SUBCUTANEOUS | 2 refills | Status: DC

## 2019-11-17 ENCOUNTER — Other Ambulatory Visit: Payer: Self-pay | Admitting: Endocrinology

## 2019-11-28 ENCOUNTER — Telehealth: Payer: Self-pay | Admitting: Podiatry

## 2019-11-28 NOTE — Telephone Encounter (Signed)
Brandi left message checking on status of diabetic shoes, as we know brace is in the office already but they were trying to do both at the same time.  I returned call and left message for Merry Proud that pts doctor has sent a note in June that the doctor would sign off at pts next appt when pt has a foot exam. I seen that pt had an appt on 7.23 and I refaxed paperwork with a note to see if we can get it signed. Received call from [facility] and she is going to send them a message as well.

## 2019-12-07 ENCOUNTER — Encounter: Payer: Self-pay | Admitting: Family

## 2019-12-07 ENCOUNTER — Ambulatory Visit (INDEPENDENT_AMBULATORY_CARE_PROVIDER_SITE_OTHER): Payer: Medicare Other | Admitting: Family

## 2019-12-07 ENCOUNTER — Ambulatory Visit: Payer: Medicare Other | Admitting: Cardiovascular Disease

## 2019-12-07 ENCOUNTER — Other Ambulatory Visit: Payer: Self-pay

## 2019-12-07 VITALS — BP 100/60 | HR 71 | Ht 64.0 in | Wt 167.2 lb

## 2019-12-07 DIAGNOSIS — I10 Essential (primary) hypertension: Secondary | ICD-10-CM | POA: Diagnosis not present

## 2019-12-07 DIAGNOSIS — I779 Disorder of arteries and arterioles, unspecified: Secondary | ICD-10-CM | POA: Diagnosis not present

## 2019-12-07 DIAGNOSIS — I739 Peripheral vascular disease, unspecified: Secondary | ICD-10-CM | POA: Diagnosis not present

## 2019-12-07 DIAGNOSIS — I6521 Occlusion and stenosis of right carotid artery: Secondary | ICD-10-CM | POA: Diagnosis not present

## 2019-12-07 DIAGNOSIS — I2510 Atherosclerotic heart disease of native coronary artery without angina pectoris: Secondary | ICD-10-CM | POA: Diagnosis not present

## 2019-12-07 DIAGNOSIS — E785 Hyperlipidemia, unspecified: Secondary | ICD-10-CM

## 2019-12-07 NOTE — Progress Notes (Signed)
Office Visit    Patient Name: Natasha Hernandez Date of Encounter: 12/07/2019  Primary Care Provider:  Salley Scarlet, MD Primary Cardiologist:  Lorine Bears, MD Electrophysiologist:  None   Chief Complaint    Natasha Hernandez is a 79 y.o. female with a hx of CAD s/p CABG in 1996 at [facility], DM 1, HTN, HLD, PAD, coronary disease presents today for follow-up of CAD and PAD  Past Medical History    Past Medical History:  Diagnosis Date  . Carotid artery occlusion   . Coronary artery disease    CABG in 1996. Most recent cardiac catheterization in 2013 showed patent grafts including LIMA to LAD, SVG to OM, SVG to diagonal and SVG to PDA.  . Diabetes mellitus without complication (HCC)   . Hyperlipidemia   . Hypertension   . PAD (peripheral artery disease) (HCC)    Past Surgical History:  Procedure Laterality Date  . CARDIAC CATHETERIZATION    . CORONARY ARTERY BYPASS GRAFT  1996  . EYE SURGERY      Allergies  No Known Allergies  History of Present Illness    Natasha Hernandez is a 79 y.o. female with a hx of CAD s/p CABG in 1996 at [facility], DM 3 since 79 years old, HTN, HLD, PAD, carotid artery disease.  She was last seen 05/2019 by Dr. Kirke Corin.  Most recent cardiac catheterization in 2013 showing patent grafts including SVG-OM, SVG-diagonal, SVG-PDA and LIMA to LAD.  She has known occluded right artery.  Lifelong non-smoker.  She has PAD with no significant claudication-noninvasive vascular evaluation 11/2015 with ABI 0.85 on right and 1.1 on left.  Duplex with no obstructive aortoiliac or SFA disease.  Noted one-vessel runoff below the knee bilaterally.  Lexiscan Myoview January 2020 with no evidence of ischemia and normal EF.  Hospitalized December 2020 with COVID-19 infection with normal high-sensitivity troponin.  Presents today for follow-up with her family member.  She reports feeling overall well since last seen.  They are celebrating her husband's  birthday's this evening by going out to dinner.  Reports no shortness of breath nor dyspnea on exertion. Reports no chest pain, pressure, or tightness. No edema, orthopnea, PND. Reports no palpitations.   EKGs/Labs/Other Studies Reviewed:   The following studies were reviewed today:   EKG:  EKG is ordered today.  The ekg ordered today demonstrates NSR 71 bpm, stable TWI V1 and V2, with no acute St/T wave changes.   Recent Labs: 04/05/2019: Hemoglobin 12.6; Magnesium 2.0; Platelets 210 09/07/2019: ALT 20 11/05/2019: BUN 21; Creatinine, Ser 0.86; Potassium 4.5; Sodium 138  Recent Lipid Panel    Component Value Date/Time   CHOL 131 09/07/2019 0947   TRIG 47.0 09/07/2019 0947   HDL 70.30 09/07/2019 0947   CHOLHDL 2 09/07/2019 0947   VLDL 9.4 09/07/2019 0947   LDLCALC 51 09/07/2019 0947    Home Medications   Current Meds  Medication Sig  . aspirin EC 81 MG tablet Take 1 tablet (81 mg total) by mouth daily.  . Calcium-Magnesium-Vitamin D (CALCIUM MAGNESIUM PO) Take 1 tablet by mouth daily.  . carvedilol (COREG) 6.25 MG tablet Take 1 tablet (6.25 mg total) by mouth 2 (two) times daily with a meal.  . Coenzyme Q10 (CO Q 10 PO) Take 1 capsule by mouth daily.  . Continuous Blood Gluc Sensor (DEXCOM G6 SENSOR) MISC 1 each by Does not apply route See admin instructions. Use one sensor once every 10 days  to monitor blood sugars.  . Continuous Blood Gluc Transmit (DEXCOM G6 TRANSMITTER) MISC 1 each by Does not apply route See admin instructions. Use one transmitter once every 90 days to transmit blood sugars.  Marland Kitchen ezetimibe (ZETIA) 10 MG tablet Take 1 tablet by mouth daily.  Marland Kitchen glucose blood test strip Use Contour Next test strips as instructed to check blood sugar 4 times daily. DX:E10.65  . HUMALOG 100 UNIT/ML cartridge Use max of 40 units daily via insulin pump. DX:E11.65  . HUMALOG 100 UNIT/ML injection INJECT 40 UNITS SUBCUTANEOUSLY VIA OMNIPOD INSULIN PUMP  . Insulin Disposable Pump  (OMNIPOD DASH 5 PACK PODS) MISC 1 each by Other route See admin instructions. APPLY ONE POD EVERY 3 DAYS AS DIRECTED  . isosorbide mononitrate (IMDUR) 60 MG 24 hr tablet Take 1/2 tablets by mouth every morning and take 1 tablet by mouth every evening.  Marland Kitchen lisinopril (ZESTRIL) 10 MG tablet Take 1 tablet by mouth daily.  . Multiple Vitamin (MULTIVITAMIN) tablet Take 1 tablet by mouth daily.  . rosuvastatin (CRESTOR) 40 MG tablet Take 1 tablet by mouth daily.  Marland Kitchen triamcinolone cream (KENALOG) 0.1 % Apply 1 application topically 2 (two) times daily.   Review of Systems      Review of Systems  Constitutional: Negative for chills, fever and malaise/fatigue.  Cardiovascular: Negative for chest pain, dyspnea on exertion, leg swelling, near-syncope, orthopnea, palpitations and syncope.  Respiratory: Negative for cough, shortness of breath and wheezing.   Gastrointestinal: Negative for nausea and vomiting.  Neurological: Negative for dizziness, light-headedness and weakness.   All other systems reviewed and are otherwise negative except as noted above.  Physical Exam    VS:  BP 100/60 (BP Location: Right Wrist, Patient Position: Sitting, Cuff Size: Normal)   Pulse 71   Ht 5\' 4"  (1.626 m)   Wt 167 lb 4 oz (75.9 kg)   SpO2 98%   BMI 28.71 kg/m  , BMI Body mass index is 28.71 kg/m. GEN: Well nourished, well developed, in no acute distress. HEENT: normal. Neck: Supple, no JVD, carotid bruits, or masses. Cardiac: RRR, no murmurs, rubs, or gallops. No clubbing, cyanosis, edema.  Radials/DP/PT 2+ and equal bilaterally.  Respiratory:  Respirations regular and unlabored, clear to auscultation bilaterally. GI: Soft, nontender, nondistended, BS + x 4. MS: No deformity or atrophy. Skin: Warm and dry, no rash. Neuro:  Strength and sensation are intact. Psych: Normal affect.  Assessment & Plan    1. CAD s/p CABG 1996 -cardiac cath 2013 with patent grafts.  Lexiscan Myoview January 2020 low risk with  no ischemia.  No anginal symptoms.  GDMT includes aspirin, beta blocker, long acting nitrate, statin.  2. Peripheral arterial disease -no significant claudication nor critical limb ischemia.  Continue medical therapy.  Walking regimen encouraged. Continue aspirin, crestor, zetia.   3. Bilateral carotid disease with known occluded carotid artery - Carotid duplex 06/2019 with known chronically occluded RCA and stable L sided 1-39% stenosis. Plan for repeat carotid duplex in March for monitoring. Continue lipid control and aspirin.   4. HLD, LDL goal less than 70- 08/18/19 LDL 51. Continue Crestor 40mg  and Zetia 10mg  daily.   5. HTN - BP well controlled. Continue current antihypertensive regimen.   Disposition: Follow up March 2022 after carotid duplex with Dr. or APP.   , NP 12/07/2019, 4:22 PM

## 2019-12-07 NOTE — Patient Instructions (Signed)
Medication Instructions:  Your physician recommends that you continue on your current medications as directed. Please refer to the Current Medication list given to you today.  *If you need a refill on your cardiac medications before your next appointment, please call your pharmacy*   Lab Work: None ordered  If you have labs (blood work) drawn today and your tests are completely normal, you will receive your results only by: Marland Kitchen MyChart Message (if you have MyChart) OR . A paper copy in the mail If you have any lab test that is abnormal or we need to change your treatment, we will call you to review the results.   Testing/Procedures: Your physician has requested that you have a carotid duplex IN 06/2020. This test is an ultrasound of the carotid arteries in your neck. It looks at blood flow through these arteries that supply the brain with blood. Allow one hour for this exam. There are no restrictions or special instructions.     Follow-Up: At [facility], you and your health needs are our priority.  As part of our continuing mission to provide you with exceptional heart care, we have created designated Provider Care Teams.  These Care Teams include your primary Cardiologist (physician) and Advanced Practice Providers (APPs -  Physician Assistants and Nurse Practitioners) who all work together to provide you with the care you need, when you need it.  We recommend signing up for the patient portal called "MyChart".  Sign up information is provided on this After Visit Summary.  MyChart is used to connect with patients for Virtual Visits (Telemedicine).  Patients are able to view lab/test results, encounter notes, upcoming appointments, etc.  Non-urgent messages can be sent to your provider as well.   To learn more about what you can do with MyChart, go to [email or web address].    Your next appointment:   March 2022 after your Carotid Study  The format for your next appointment:   In  Person  Provider:    You may see Lorine Bears, MD or one of the following Advanced Practice Providers on your designated Care Team:    Nicolasa Ducking, NP  Eula Listen, PA-C  Marisue Ivan, PA-C    Other Instructions

## 2019-12-08 ENCOUNTER — Encounter: Payer: Self-pay | Admitting: Family

## 2019-12-14 ENCOUNTER — Other Ambulatory Visit: Payer: Self-pay

## 2019-12-14 ENCOUNTER — Encounter: Payer: Self-pay | Admitting: Podiatry

## 2019-12-14 ENCOUNTER — Ambulatory Visit (INDEPENDENT_AMBULATORY_CARE_PROVIDER_SITE_OTHER): Payer: Medicare Other | Admitting: Podiatry

## 2019-12-14 DIAGNOSIS — B351 Tinea unguium: Secondary | ICD-10-CM

## 2019-12-14 DIAGNOSIS — M2041 Other hammer toe(s) (acquired), right foot: Secondary | ICD-10-CM

## 2019-12-14 DIAGNOSIS — E1142 Type 2 diabetes mellitus with diabetic polyneuropathy: Secondary | ICD-10-CM

## 2019-12-14 DIAGNOSIS — M79675 Pain in left toe(s): Secondary | ICD-10-CM

## 2019-12-14 DIAGNOSIS — L84 Corns and callosities: Secondary | ICD-10-CM | POA: Diagnosis not present

## 2019-12-14 DIAGNOSIS — M79674 Pain in right toe(s): Secondary | ICD-10-CM

## 2019-12-14 DIAGNOSIS — M216X1 Other acquired deformities of right foot: Secondary | ICD-10-CM

## 2019-12-14 DIAGNOSIS — M2042 Other hammer toe(s) (acquired), left foot: Secondary | ICD-10-CM

## 2019-12-16 ENCOUNTER — Other Ambulatory Visit: Payer: Self-pay | Admitting: Cardiovascular Disease

## 2019-12-16 ENCOUNTER — Other Ambulatory Visit: Payer: Self-pay | Admitting: Endocrinology

## 2019-12-16 DIAGNOSIS — E7800 Pure hypercholesterolemia, unspecified: Secondary | ICD-10-CM

## 2019-12-16 NOTE — Progress Notes (Signed)
Subjective: Natasha Hernandez presents today at risk foot care. Pt has h/o NIDDM with PAD and painful callus(es) right foot and painful mycotic toenails b/l that are difficult to trim. Pain interferes with ambulation. Aggravating factors include wearing enclosed shoe gear. Pain is relieved with periodic professional debridement.   Her daughter in law is present during today's visit.  She is followed by Dr. Kirke Corin for Cardiology and Dr. Lucianne Muss for Endocrinology.  Jeanice Lim, Velna Hatchet, MD is patient's PCP.  Past Medical History:  Diagnosis Date  . Carotid artery occlusion   . Coronary artery disease    CABG in 1996. Most recent cardiac catheterization in 2013 showed patent grafts including LIMA to LAD, SVG to OM, SVG to diagonal and SVG to PDA.  . Diabetes mellitus without complication (HCC)   . Hyperlipidemia   . Hypertension   . PAD (peripheral artery disease) (HCC)      Current Outpatient Medications on File Prior to Visit  Medication Sig Dispense Refill  . aspirin EC 81 MG tablet Take 1 tablet (81 mg total) by mouth daily. 90 tablet 3  . Calcium-Magnesium-Vitamin D (CALCIUM MAGNESIUM PO) Take 1 tablet by mouth daily.    . carvedilol (COREG) 6.25 MG tablet Take 1 tablet (6.25 mg total) by mouth 2 (two) times daily with a meal. 60 tablet 1  . Coenzyme Q10 (CO Q 10 PO) Take 1 capsule by mouth daily.    . Continuous Blood Gluc Sensor (DEXCOM G6 SENSOR) MISC 1 each by Does not apply route See admin instructions. Use one sensor once every 10 days to monitor blood sugars.    . Continuous Blood Gluc Transmit (DEXCOM G6 TRANSMITTER) MISC 1 each by Does not apply route See admin instructions. Use one transmitter once every 90 days to transmit blood sugars.    Marland Kitchen ezetimibe (ZETIA) 10 MG tablet Take 1 tablet by mouth daily. 90 tablet 1  . glucose blood test strip Use Contour Next test strips as instructed to check blood sugar 4 times daily. DX:E10.65 150 each 1  . HUMALOG 100 UNIT/ML cartridge Use max  of 40 units daily via insulin pump. DX:E11.65 40 mL 2  . HUMALOG 100 UNIT/ML injection INJECT 40 UNITS SUBCUTANEOUSLY VIA OMNIPOD INSULIN PUMP 20 mL 0  . Insulin Disposable Pump (OMNIPOD DASH 5 PACK PODS) MISC 1 each by Other route See admin instructions. APPLY ONE POD EVERY 3 DAYS AS DIRECTED 10 each 11  . isosorbide mononitrate (IMDUR) 60 MG 24 hr tablet Take 1/2 tablets by mouth every morning and take 1 tablet by mouth every evening. 45 tablet 1  . lisinopril (ZESTRIL) 10 MG tablet Take 1 tablet by mouth daily. 90 tablet 0  . Multiple Vitamin (MULTIVITAMIN) tablet Take 1 tablet by mouth daily.    . rosuvastatin (CRESTOR) 40 MG tablet Take 1 tablet by mouth daily. 90 tablet 1  . triamcinolone cream (KENALOG) 0.1 % Apply 1 application topically 2 (two) times daily. 30 g 0   No current facility-administered medications on file prior to visit.     No Known Allergies  Objective: Natasha Hernandez is a pleasant 79 y.o. Caucasian female, WD, WN in NAD. AAO x 3.  There were no vitals filed for this visit.  Vascular Examination: Neurovascular status unchanged b/l lower extremities. Capillary refill time to digits immediate b/l. Palpable DP pulses b/l. Palpable PT pulses b/l. Pedal hair present b/l. Skin temperature gradient within normal limits b/l.  Dermatological Examination: Pedal skin is thin shiny, atrophic  bilaterally. No open wounds bilaterally. No interdigital macerations bilaterally. Toenails 1-5 b/l elongated, discolored, dystrophic, thickened, crumbly with subungual debris and tenderness to dorsal palpation. Hyperkeratotic lesion(s) submet head 1 right foot and submet head 5 right foot.  No erythema, no edema, no drainage, no flocculence.   Annular erythematous lesion right 4th toe, appears to be vascular in nature. No nodularity, no elevation. Blanches with palpation. No pain on palpation. No warmth.  Callus plantar left foot remains resolved.  Musculoskeletal: Normal muscle  strength 5/5 to all lower extremity muscle groups bilaterally. No pain crepitus or joint limitation noted with ROM b/l. Hammertoes noted to the 1-5 bilaterally.   Fixed inversion deformity right ankle. Decreased eversion right foot.  Neurological Examination: Protective sensation diminished with 10g monofilament b/l. Vibratory sensation intact b/l. Proprioception intact bilaterally. Babinski reflex negative b/l. Clonus negative b/l.  Assessment: 1. Pain due to onychomycosis of toenails of both feet   2. Callus   3. Inversion deformity of right foot   4. Acquired hammertoes of both feet   5. Diabetic peripheral neuropathy associated with type 2 diabetes mellitus (HCC)     Plan: -Examined patient. -Continue diabetic foot care principles.  -For vascular lesion right 4th toe, monitor for any changes. Daughter in law informed.  -Toenails 1-5 b/l were debrided in length and girth with sterile nail nippers and dremel without iatrogenic bleeding.  -Hyperkeratotic lesions pared submet heads 1, 5 right foot utilizing sterile scalpel blade without incident. Pedorthist offloaded calluses for right foot on today's visit. -Patient to continue soft, supportive shoe gear daily. -Patient to report any pedal injuries to medical professional immediately. -Patient/POA to call should there be question/concern in the interim.  Return in about 3 months (around 03/15/2020) for diabetic nail and callus trim.  Freddie Breech, DPM

## 2020-01-25 DIAGNOSIS — H18423 Band keratopathy, bilateral: Secondary | ICD-10-CM | POA: Diagnosis not present

## 2020-01-25 DIAGNOSIS — H35373 Puckering of macula, bilateral: Secondary | ICD-10-CM | POA: Diagnosis not present

## 2020-01-25 DIAGNOSIS — Z961 Presence of intraocular lens: Secondary | ICD-10-CM | POA: Diagnosis not present

## 2020-01-25 DIAGNOSIS — Z794 Long term (current) use of insulin: Secondary | ICD-10-CM | POA: Diagnosis not present

## 2020-01-25 DIAGNOSIS — E113553 Type 2 diabetes mellitus with stable proliferative diabetic retinopathy, bilateral: Secondary | ICD-10-CM | POA: Diagnosis not present

## 2020-01-31 ENCOUNTER — Other Ambulatory Visit: Payer: Self-pay

## 2020-01-31 ENCOUNTER — Ambulatory Visit: Payer: Medicare Other | Admitting: Orthotics

## 2020-01-31 DIAGNOSIS — L84 Corns and callosities: Secondary | ICD-10-CM

## 2020-01-31 DIAGNOSIS — B351 Tinea unguium: Secondary | ICD-10-CM

## 2020-01-31 DIAGNOSIS — E1042 Type 1 diabetes mellitus with diabetic polyneuropathy: Secondary | ICD-10-CM

## 2020-01-31 DIAGNOSIS — M79674 Pain in right toe(s): Secondary | ICD-10-CM

## 2020-01-31 DIAGNOSIS — M79675 Pain in left toe(s): Secondary | ICD-10-CM

## 2020-01-31 DIAGNOSIS — M216X1 Other acquired deformities of right foot: Secondary | ICD-10-CM

## 2020-01-31 NOTE — Progress Notes (Signed)
Very complicated and challenging case:  Even with the [place] brace and adding RF/FF valgus wedging (about 4*), Brianni still "overpowers" brace/wedging and rolls her ankle laterally. R.     I propose 1) send brace back to [place] and add more extrinsic posting anf see if we can get her more stable laterally, 2) cast her for custom shoe for 2022 w/ 5* lateral wedging, sole flare and lateral buttress.  Also important custom diabetic inserts w/ brace MUST have arch hug to bring ground up to meet arch in order to reduce plantar ground reaction pressure/sheer.   2021 shoes (MB[employer]) will be returned and surefit synney be ordered.

## 2020-02-01 ENCOUNTER — Ambulatory Visit: Payer: Medicare Other | Admitting: Orthotics

## 2020-02-05 ENCOUNTER — Other Ambulatory Visit: Payer: Self-pay

## 2020-02-05 ENCOUNTER — Ambulatory Visit: Payer: Medicare Other | Admitting: Orthotics

## 2020-02-05 DIAGNOSIS — L84 Corns and callosities: Secondary | ICD-10-CM

## 2020-02-05 DIAGNOSIS — E08621 Diabetes mellitus due to underlying condition with foot ulcer: Secondary | ICD-10-CM

## 2020-02-05 DIAGNOSIS — L97521 Non-pressure chronic ulcer of other part of left foot limited to breakdown of skin: Secondary | ICD-10-CM

## 2020-02-05 NOTE — Progress Notes (Signed)
Picked up replacement shoes.

## 2020-02-16 ENCOUNTER — Other Ambulatory Visit: Payer: Self-pay | Admitting: Endocrinology

## 2020-02-18 ENCOUNTER — Other Ambulatory Visit (INDEPENDENT_AMBULATORY_CARE_PROVIDER_SITE_OTHER): Payer: Medicare Other

## 2020-02-18 ENCOUNTER — Other Ambulatory Visit: Payer: Self-pay

## 2020-02-18 DIAGNOSIS — I10 Essential (primary) hypertension: Secondary | ICD-10-CM

## 2020-02-18 DIAGNOSIS — E1065 Type 1 diabetes mellitus with hyperglycemia: Secondary | ICD-10-CM

## 2020-02-18 LAB — BASIC METABOLIC PANEL
BUN: 25 mg/dL — ABNORMAL HIGH (ref 6–23)
CO2: 33 mEq/L — ABNORMAL HIGH (ref 19–32)
Calcium: 9.4 mg/dL (ref 8.4–10.5)
Chloride: 102 mEq/L (ref 96–112)
Creatinine, Ser: 1.05 mg/dL (ref 0.40–1.20)
GFR: 51.11 mL/min — ABNORMAL LOW (ref >60.00)
Glucose, Bld: 147 mg/dL — ABNORMAL HIGH (ref 70–99)
Potassium: 4.5 mEq/L (ref 3.5–5.1)
Sodium: 140 mEq/L (ref 135–145)

## 2020-02-18 LAB — HEMOGLOBIN A1C: Hgb A1c MFr Bld: 8.3 % — ABNORMAL HIGH (ref 4.6–6.5)

## 2020-02-22 ENCOUNTER — Ambulatory Visit (INDEPENDENT_AMBULATORY_CARE_PROVIDER_SITE_OTHER): Payer: Medicare Other | Admitting: Endocrinology

## 2020-02-22 ENCOUNTER — Other Ambulatory Visit: Payer: Self-pay

## 2020-02-22 ENCOUNTER — Encounter: Payer: Self-pay | Admitting: Endocrinology

## 2020-02-22 VITALS — BP 124/78 | HR 75 | Ht 64.0 in | Wt 168.8 lb

## 2020-02-22 DIAGNOSIS — I10 Essential (primary) hypertension: Secondary | ICD-10-CM

## 2020-02-22 DIAGNOSIS — I6521 Occlusion and stenosis of right carotid artery: Secondary | ICD-10-CM | POA: Diagnosis not present

## 2020-02-22 DIAGNOSIS — E7800 Pure hypercholesterolemia, unspecified: Secondary | ICD-10-CM

## 2020-02-22 DIAGNOSIS — Z23 Encounter for immunization: Secondary | ICD-10-CM

## 2020-02-22 DIAGNOSIS — E1065 Type 1 diabetes mellitus with hyperglycemia: Secondary | ICD-10-CM

## 2020-02-22 NOTE — Progress Notes (Signed)
Patient ID: Natasha Hernandez, female   DOB: 1941-02-11, 79 y.o.   MRN: [id number]            Reason for Appointment : Follow-up for Type 1 Diabetes    History of Present Illness          Diagnosis: Type 1 diabetes mellitus, date of diagnosis: 1953          Previous history:  She thinks she has been on various insulin regimens over the years but has been on Lantus for 10-12 years along with mealtime insulin Previously followed by an endocrinologist but no records available. Her A1c has usually been around 7% reportedly and was 7.2 in 7/16   Recent history:    INSULIN PUMP settings BASAL rate midnight = 0.6, 5 AM = 0. 55.  12 PM = 0.70.  6 PM = 0.6 and 9 PM = 0.65 with total basal 15 units  Carbohydrate coverage 1: 7 breakfast and dinner, 1: 6 at lunch, sensitivity 1: 60 and active insulin 4 hours. Blood sugar target 135-140  Current management, blood sugar patterns and problems identified:    Her A1c stays over 8% and now 8.3   She appears to have overall higher readings compared to her last visit  Her CGM download interpretation is as follows  HYPOGLYCEMIA has not been occurring  HYPERGLYCEMIA is again occurring frequently overnight; blood sugars are generally rising above daily target range after about 8-9 PM and generally the highest around midnight with variable decrease through the night to the morning  Blood sugars are at the upper normal target range the rest of the day but inconsistent from day today  Blood sugars are fairly consistently around 200 or more at breakfast time the last week and near 180 at lunch and dinner  POSTPRANDIAL blood sugars are rising only occasionally after lunch and dinner but usually trending progressively lower after breakfast until lunchtime  Lowest blood sugars overall are before lunch without hypoglycemia documented  She says she is afraid of hypoglycemia overnight and likes to keep her bedtime readings at 200 likely from either  snacks or reducing her insulin bolus  She also will not eat a snack at night at bedtime if blood sugars are relatively higher for fear of raising her blood sugar more  She is generally trying to stay active gardening or other activities but not a lot of formal exercise   Mealtimes are: Breakfast: 8.30 AM, lunch 1-2 PM, Dinner: 7.30 Glucose monitoring:  is being done several times a day         Glucometer: Dexcom  CGM use % of time  93  2-week average/SD  194+/-52  Time in range  45      %  % Time Above 180  40  % Time above 250  15  % Time Below 70      PRE-MEAL Fasting Lunch Dinner Bedtime Overall  Glucose range:       Averages:  184  177  179  222    POST-MEAL PC Breakfast PC Lunch PC Dinner  Glucose range:     Averages:  190   210   PREVIOUS data:   CGM use % of time  98  2-week average/SD  173, was 185  Time in range      64% was 55  % Time Above 180  36  % Time above 250  7  % Time Below 70  0.2   Previous data:  CGM use % of time  97  Average and SD  185+/-56  Time in range     55   %, was 54  % Time Above 180  45  % Time above 250  13  % Time Below target  0    Glycemic patterns summary: Blood sugars are fairly consistently high late evening after 9 PM and usually until about 7-8 AM Otherwise blood sugars during the day are relatively well controlled with occasional blood sugar spikes  Factors causing Hypoglycemia: Overestimating mealtime coverage, excessive basal rate  Symptoms of hypoglycemia: Some weakness, confusion at times Treatment of hypoglycemia: Corn syrup or jellybeans/other sweets.  She will get 15 g of carbohydrate  Self-care: The diet that the patient has been following is: Carbohydrate counting  Mealtimes are: Breakfast AT 8.30 AM Dinne r: 7.30   For breakfast will have toast with cheese or egg, sometimes cereal and egg  Exercise: walking and that time will use her exercise bike        Dietician consultation: Most recent: Years ago .          CDE consultation: 04/2018   Wt Readings from Last 3 Encounters:  02/22/20 168 lb 12.8 oz (76.6 kg)  12/07/19 167 lb 4 oz (75.9 kg)  11/09/19 167 lb 9.6 oz (76 kg)    Lab Results  Component Value Date   HGBA1C 8.3 (H) 02/18/2020   HGBA1C 8.4 (H) 11/05/2019   HGBA1C 8.4 (H) 09/07/2019   Lab Results  Component Value Date   MICROALBUR 1.2 09/07/2019   LDLCALC 51 09/07/2019   CREATININE 1.05 02/18/2020    Lab Results  Component Value Date   MICRALBCREAT 0.9 09/07/2019   MICRALBCREAT 4.5 08/18/2018     Allergies as of 02/22/2020   No Known Allergies     Medication List       Accurate as of February 22, 2020  9:59 AM. If you have any questions, ask your nurse or doctor.        aspirin EC 81 MG tablet Take 1 tablet (81 mg total) by mouth daily.   CALCIUM MAGNESIUM PO Take 1 tablet by mouth daily.   carvedilol 6.25 MG tablet Commonly known as: COREG Take 1 tablet by mouth twice daily with meals.   CO Q 10 PO Take 1 capsule by mouth daily.   Dexcom G6 Sensor Misc 1 each by Does not apply route See admin instructions. Use one sensor once every 10 days to monitor blood sugars.   Dexcom G6 Transmitter Misc 1 each by Does not apply route See admin instructions. Use one transmitter once every 90 days to transmit blood sugars.   ezetimibe 10 MG tablet Commonly known as: ZETIA Take 1 tablet by mouth daily.   glucose blood test strip Use Contour Next test strips as instructed to check blood sugar 4 times daily. DX:E10.65   HumaLOG 100 UNIT/ML injection Generic drug: insulin lispro INJECT 40 UNITS SUBCUTANEOUSLY VIA OMNIPOD INSULIN PUMP   HumaLOG 100 UNIT/ML cartridge Generic drug: insulin lispro Use max of 40 units daily via insulin pump. DX:E11.65   isosorbide mononitrate 60 MG 24 hr tablet Commonly known as: IMDUR Take 1/2 tablet by mouth every morning, and, take 1 tablet by mouth every evening.   lisinopril 10 MG tablet Commonly known as:  ZESTRIL Take 1 tablet by mouth daily.   multivitamin tablet Take 1 tablet by mouth daily.   OmniPod Dash 5 Pack Pods Misc 1 each by Other  route See admin instructions. APPLY ONE POD EVERY 3 DAYS AS DIRECTED   rosuvastatin 40 MG tablet Commonly known as: CRESTOR Take 1 tablet by mouth daily.   triamcinolone cream 0.1 % Commonly known as: KENALOG Apply 1 application topically 2 (two) times daily.       Allergies: No Known Allergies  Past Medical History:  Diagnosis Date  . Carotid artery occlusion   . Coronary artery disease    CABG in 1996. Most recent cardiac catheterization in 2013 showed patent grafts including LIMA to LAD, SVG to OM, SVG to diagonal and SVG to PDA.  . Diabetes mellitus without complication (HCC)   . Hyperlipidemia   . Hypertension   . PAD (peripheral artery disease) (HCC)     Past Surgical History:  Procedure Laterality Date  . CARDIAC CATHETERIZATION    . CORONARY ARTERY BYPASS GRAFT  1996  . EYE SURGERY      Family History  Problem Relation Age of Onset  . Breast cancer Mother [id number]  . Stroke Mother   . Emphysema Father [id number]  . Heart attack Father     Social History:  reports that she has never smoked. She has never used smokeless tobacco. She reports that she does not drink alcohol. No history on file for drug use.      Review of Systems                                                            Lipids:  She is on Crestor 40 mg daily and since 7/19 also on Zetia  Has history of CAD  several years ago with coronary bypass LDL is below 70 as follows   Lab Results  Component Value Date   CHOL 131 09/07/2019   HDL 70.30 09/07/2019   LDLCALC 51 09/07/2019   TRIG 47.0 09/07/2019   CHOLHDL 2 09/07/2019    Has had proliferative retinopathy, has been going regularly for exams  Neuropathy with some sensory loss: Forms filled out for diabetic shoes previously  HYPERTENSION:  This is treated with lisinopril 10 mg daily along with  low-dose Coreg   BP Readings from Last 3 Encounters:  02/22/20 124/78  12/07/19 100/60  11/09/19 (!) 110/64     Lab Results  Component Value Date   CREATININE 1.05 02/18/2020   CREATININE 0.86 11/05/2019   CREATININE 0.99 09/07/2019    History of Covid infection: No sequelae but she is again refusing to consider the Covid vaccine She is however open to the flu vaccine Reminded her that the flu vaccine this year is different than what she had in January  LABS:  Lab on 02/18/2020  Component Date Value Ref Range Status  . Sodium 02/18/2020 140  135 - 145 mEq/L Final  . Potassium 02/18/2020 4.5  3.5 - 5.1 mEq/L Final  . Chloride 02/18/2020 102  96 - 112 mEq/L Final  . CO2 02/18/2020 33* 19 - 32 mEq/L Final  . Glucose, Bld 02/18/2020 147* 70 - 99 mg/dL Final  . BUN 16/10/960411/04/2019 25* 6 - 23 mg/dL Final  . Creatinine, Ser 02/18/2020 1.05  0.40 - 1.20 mg/dL Final  . GFR 54/09/811911/04/2019 51.11* >60.00 mL/min Final   Calculated using the CKD-EPI Creatinine Equation (2021)  . Calcium 02/18/2020 9.4  8.4 - 10.5 mg/dL  Final  . Hgb A1c MFr Bld 02/18/2020 8.3* 4.6 - 6.5 % Final   Glycemic Control Guidelines for People with Diabetes:Non Diabetic:  <6%Goal of Therapy: <7%Additional Action Suggested:  >8%      Physical Examination:  BP 124/78   Pulse 75   Ht 5\' 4"  (1.626 m)   Wt 168 lb 12.8 oz (76.6 kg)   SpO2 95%   BMI 28.97 kg/m     ASSESSMENT:  Diabetes type 1, long-standing  A1c is 8.3  See history of present illness for analysis of current diabetes management, blood sugar patterns and problems identified  She is on an Omni pod insulin pump with Humalog insulin and using the Dexcom for CGM  Although her blood sugars are not optimal A1c is still reasonable for her age and duration of diabetes She clearly has excessive hyperglycemia late in the evenings and overnight and she has been reluctant to improve her overnight control for fear of hypoglycemia However discussed that her  basal rate can be segmented to allow better control when her blood sugars are the highest and still avoid hypoglycemia She can still take a bedtime snack when we will do a bolus to cover the carbohydrates along with any correction of the high reading However will reduce the target after 10 PM to allow only partial correction  She is currently not having any issues with hypoglycemia and is benefiting from the accuracy of the Dexcom and alerts which are also being sent to her daughter  Hypertension: Her blood pressure is well controlled  Renal function is stable    PLAN:   Basal rates will be revised as follows 12 AM = 0.8 until 3 AM, 3 AM-10 AM = 0.6, 10 AM-1 PM = 0.55, 1 PM-6 PM = 0.0 0.7, 6 PM-9 PM = 0.65, 9 PM = 0.85   Carbohydrate coverage 1: 10 at breakfast until 11 AM Continue 1: 6 for lunch and dinner 10 PM blood sugar target = 150 No change in active insulin time or correction threshold of 160 Bolus consistently a few minutes before starting meals  She will eventually benefit from the closed-loop pump when it is available to improve her overall control and variability especially overnight  She will call if she has any further issues with continued high sugars at any given time  Influenza vaccine given  There are no Patient Instructions on file for this visit.   There are no Patient Instructions on file for this visit.     02/22/2020, 9:59 AM   Note: This note was prepared with Dragon voice recognition system technology. Any transcriptional errors that result from this process are unintentional.

## 2020-02-25 ENCOUNTER — Ambulatory Visit: Payer: Medicare Other | Admitting: Orthotics

## 2020-02-25 ENCOUNTER — Other Ambulatory Visit: Payer: Self-pay

## 2020-02-25 ENCOUNTER — Other Ambulatory Visit: Payer: Medicare Other | Admitting: Orthotics

## 2020-02-25 DIAGNOSIS — L97521 Non-pressure chronic ulcer of other part of left foot limited to breakdown of skin: Secondary | ICD-10-CM

## 2020-02-25 DIAGNOSIS — L84 Corns and callosities: Secondary | ICD-10-CM

## 2020-02-25 DIAGNOSIS — E08621 Diabetes mellitus due to underlying condition with foot ulcer: Secondary | ICD-10-CM

## 2020-03-03 NOTE — Progress Notes (Signed)
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

## 2020-03-17 ENCOUNTER — Other Ambulatory Visit: Payer: Self-pay | Admitting: Endocrinology

## 2020-03-17 DIAGNOSIS — E7800 Pure hypercholesterolemia, unspecified: Secondary | ICD-10-CM

## 2020-03-18 ENCOUNTER — Other Ambulatory Visit: Payer: Self-pay

## 2020-03-18 ENCOUNTER — Ambulatory Visit (INDEPENDENT_AMBULATORY_CARE_PROVIDER_SITE_OTHER): Payer: Medicare Other | Admitting: Podiatry

## 2020-03-18 ENCOUNTER — Ambulatory Visit (INDEPENDENT_AMBULATORY_CARE_PROVIDER_SITE_OTHER): Payer: Medicare Other

## 2020-03-18 DIAGNOSIS — S90121A Contusion of right lesser toe(s) without damage to nail, initial encounter: Secondary | ICD-10-CM

## 2020-03-18 DIAGNOSIS — M79671 Pain in right foot: Secondary | ICD-10-CM

## 2020-03-18 NOTE — Progress Notes (Signed)
  Subjective:  Patient ID: Natasha Hernandez, female    DOB: 01/13/1941,  MRN: [id number]  Chief Complaint  Patient presents with  . Foot Problem    Right 2nd toe swelling and some pain with pressure. Pt denies drainage and states no known injury. 1 week duration.  . Foot Problem    Right dorsal foot swelling, 1wk duration, no known injuries.  . Nail Problem    Right 2nd toenail discoloration around nail, 1 wk duration.   79 y.o. female presents with the above complaint. History confirmed with patient.   Objective:  Physical Exam: warm, good capillary refill, no trophic changes or ulcerative lesions, normal DP and PT pulses and absent protective sensation, decreased to light touch sensation  right Foot: Right second toe mild edema with out warmth or erythema.  There does appear to be dried blood underneath the nail. Pain to palpation about the proximal interphalangeal joint  No images are attached to the encounter.  Radiographs: X-ray of the right foot: Cavus foot type digital contractures noted prior arthroplasty noted of the first and second digits slight hallux varus deformity lesser digital contractures Assessment:   1. Contusion of second toe of right foot, initial encounter      Plan:  Patient was evaluated and treated and all questions answered.  Contusion of toe right -X-rays reviewed with patient.  Discussed that she may have bruised the toe.  She appears to have stable arthroplasty of the digit that she does not remember.  Debrided the discussed with the nail for ingrowing nail.  Dressed with Silvadene and Band-Aid.  Advised to buddy splint the 2 toes together to promote healing.  No follow-ups on file.

## 2020-03-25 ENCOUNTER — Other Ambulatory Visit: Payer: Self-pay | Admitting: Podiatry

## 2020-03-25 DIAGNOSIS — S90121A Contusion of right lesser toe(s) without damage to nail, initial encounter: Secondary | ICD-10-CM

## 2020-04-04 ENCOUNTER — Ambulatory Visit: Payer: Medicare Other | Admitting: Podiatry

## 2020-04-06 ENCOUNTER — Ambulatory Visit (HOSPITAL_COMMUNITY)
Admission: EM | Admit: 2020-04-06 | Discharge: 2020-04-06 | Disposition: A | Discharge status: Home / Self Care | Payer: Medicare Other | Attending: Emergency Medicine | Admitting: Emergency Medicine

## 2020-04-06 ENCOUNTER — Other Ambulatory Visit: Payer: Self-pay

## 2020-04-06 ENCOUNTER — Encounter (HOSPITAL_COMMUNITY): Payer: Self-pay

## 2020-04-06 ENCOUNTER — Ambulatory Visit (INDEPENDENT_AMBULATORY_CARE_PROVIDER_SITE_OTHER): Payer: Medicare Other

## 2020-04-06 DIAGNOSIS — Z20822 Contact with and (suspected) exposure to covid-19: Secondary | ICD-10-CM | POA: Insufficient documentation

## 2020-04-06 DIAGNOSIS — J22 Unspecified acute lower respiratory infection: Secondary | ICD-10-CM | POA: Diagnosis not present

## 2020-04-06 DIAGNOSIS — J90 Pleural effusion, not elsewhere classified: Secondary | ICD-10-CM | POA: Diagnosis not present

## 2020-04-06 DIAGNOSIS — R062 Wheezing: Secondary | ICD-10-CM | POA: Diagnosis not present

## 2020-04-06 DIAGNOSIS — R0602 Shortness of breath: Secondary | ICD-10-CM | POA: Diagnosis not present

## 2020-04-06 DIAGNOSIS — J811 Chronic pulmonary edema: Secondary | ICD-10-CM | POA: Diagnosis not present

## 2020-04-06 MED ORDER — AZITHROMYCIN 250 MG PO TABS: 250.0000 mg | ORAL_TABLET | Freq: Every day | ORAL | 0 refills | Status: DC

## 2020-04-06 MED ORDER — ALBUTEROL SULFATE HFA 108 (90 BASE) MCG/ACT IN AERS: 2.0000 | INHALATION_SPRAY | RESPIRATORY_TRACT | 0 refills | Status: DC | PRN

## 2020-04-06 MED ORDER — AMOXICILLIN-POT CLAVULANATE 875-125 MG PO TABS: 1.0000 | ORAL_TABLET | Freq: Two times a day (BID) | ORAL | 0 refills | Status: AC

## 2020-04-06 MED ORDER — AEROCHAMBER PLUS MISC: 2 refills | Status: DC

## 2020-04-06 NOTE — ED Triage Notes (Signed)
Pt c/o coughing, wheezing x 6 days. Pt states she is always sleeping and feeling fatigue. Pt daughter in law states they have given her OTC medicine but nothing helps. Pt states she has chest pain when coughing and feels SOB. Pt denies emesis, nausea, diarrhea.

## 2020-04-06 NOTE — ED Provider Notes (Signed)
HPI  SUBJECTIVE:  Natasha Hernandez is a 79 y.o. female who presents with cough, sore throat, nasal congestion, postnasal drip, loss of sense of taste, fatigue starting 6 days ago.  Sore throat has since resolved.  However reports shortness of breath and wheezing starting last night.  She reports chest soreness present with coughing only, dyspnea on exertion to about 20 feet.  States that she is unable to sleep secondary to the cough.  No fevers, body aches, headaches, sinus pain or pressure, loss of sense of smell, nausea, vomiting, diarrhea, abdominal pain.  No known Covid exposure.  She did not get the Covid vaccine.  She did get a flu shot.  She tried warm fluids, Mucinex, OTC cold medications, cough syrup, Tylenol and salt water gargles without improvement in her symptoms.  Symptoms worse with walking.  She denies lower extremity edema, recent unintentional weight gain, nocturia orthopnea, PND, abdominal pain.  No antibiotics in the past 3 months.  No antipyretic in the past 6 hours.  She has a past medical history of diabetes type 1, states that her glucose has been running within normal limits.  She has a past medical history of hypertension, acute kidney injury with complete recovery, coronary artery disease, COVID pneumonia last December for which she was admitted.  She has a history of well-controlled CHF.  No history of asthma, emphysema, COPD, smoking.  CNO:[id number], Velna Hatchet, MD     Past Medical History:  Diagnosis Date  . Carotid artery occlusion   . Coronary artery disease    CABG in 1996. Most recent cardiac catheterization in 2013 showed patent grafts including LIMA to LAD, SVG to OM, SVG to diagonal and SVG to PDA.  . Diabetes mellitus without complication (HCC)   . Hyperlipidemia   . Hypertension   . PAD (peripheral artery disease) (HCC)     Past Surgical History:  Procedure Laterality Date  . CARDIAC CATHETERIZATION    . CORONARY ARTERY BYPASS GRAFT  1996  . EYE SURGERY       Family History  Problem Relation Age of Onset  . Breast cancer Mother 71  . Stroke Mother   . Emphysema Father 8  . Heart attack Father     Social History   Tobacco Use  . Smoking status: Never Smoker  . Smokeless tobacco: Never Used  Vaping Use  . Vaping Use: Never used  Substance Use Topics  . Alcohol use: No    No current facility-administered medications for this encounter.  Current Outpatient Medications:  .  aspirin EC 81 MG tablet, Take 1 tablet (81 mg total) by mouth daily., Disp: 90 tablet, Rfl: 3 .  ezetimibe (ZETIA) 10 MG tablet, Take 1 tablet by mouth daily., Disp: 90 tablet, Rfl: 0 .  lisinopril (ZESTRIL) 10 MG tablet, Take 1 tablet by mouth daily., Disp: 90 tablet, Rfl: 0 .  albuterol (VENTOLIN HFA) 108 (90 Base) MCG/ACT inhaler, Inhale 2 puffs into the lungs every 4 (four) hours as needed for wheezing or shortness of breath. Dispense with aerochamber, Disp: 1 each, Rfl: 0 .  amoxicillin-clavulanate (AUGMENTIN) 875-125 MG tablet, Take 1 tablet by mouth 2 (two) times daily for 5 days., Disp: 10 tablet, Rfl: 0 .  azithromycin (ZITHROMAX) 250 MG tablet, Take 1 tablet (250 mg total) by mouth daily. 2 tabs po on day 1, 1 tab po on days 2-5, Disp: 6 tablet, Rfl: 0 .  Calcium-Magnesium-Vitamin D (CALCIUM MAGNESIUM PO), Take 1 tablet by mouth daily., Disp: ,  Rfl:  .  carvedilol (COREG) 6.25 MG tablet, Take 1 tablet by mouth twice daily with meals., Disp: 60 tablet, Rfl: 5 .  Coenzyme Q10 (CO Q 10 PO), Take 1 capsule by mouth daily., Disp: , Rfl:  .  Continuous Blood Gluc Sensor (DEXCOM G6 SENSOR) MISC, 1 each by Does not apply route See admin instructions. Use one sensor once every 10 days to monitor blood sugars., Disp: , Rfl:  .  Continuous Blood Gluc Transmit (DEXCOM G6 TRANSMITTER) MISC, 1 each by Does not apply route See admin instructions. Use one transmitter once every 90 days to transmit blood sugars., Disp: , Rfl:  .  glucose blood test strip, Use Contour Next  test strips as instructed to check blood sugar 4 times daily. DX:E10.65, Disp: 150 each, Rfl: 1 .  HUMALOG 100 UNIT/ML cartridge, Use max of 40 units daily via insulin pump. DX:E11.65, Disp: 40 mL, Rfl: 2 .  HUMALOG 100 UNIT/ML injection, INJECT 40 UNITS SUBCUTANEOUSLY VIA OMNIPOD INSULIN PUMP, Disp: 20 mL, Rfl: 0 .  Insulin Disposable Pump (OMNIPOD DASH 5 PACK PODS) MISC, 1 each by Other route See admin instructions. APPLY ONE POD EVERY 3 DAYS AS DIRECTED, Disp: 10 each, Rfl: 11 .  isosorbide mononitrate (IMDUR) 60 MG 24 hr tablet, Take 1/2 tablet by mouth every morning, and, take 1 tablet by mouth every evening., Disp: 45 tablet, Rfl: 5 .  Multiple Vitamin (MULTIVITAMIN) tablet, Take 1 tablet by mouth daily., Disp: , Rfl:  .  rosuvastatin (CRESTOR) 40 MG tablet, Take 1 tablet by mouth daily., Disp: 90 tablet, Rfl: 0 .  Spacer/Aero-Holding Chambers (AEROCHAMBER PLUS) inhaler, Use with inhaler, Disp: 1 each, Rfl: 2 .  triamcinolone cream (KENALOG) 0.1 %, Apply 1 application topically 2 (two) times daily., Disp: 30 g, Rfl: 0  No Known Allergies   ROS  As noted in HPI.   Physical Exam  BP (!) 152/65 (BP Location: Right Wrist)   Pulse 78   Temp 98.4 F (36.9 C) (Oral)   Resp 18   SpO2 94%   Constitutional: Well developed, well nourished, no acute distress.  Speaking in full sentences. Eyes:  EOMI, conjunctiva normal bilaterally HENT: Normocephalic, atraumatic,mucus membranes moist.  No nasal congestion.  No maxillary, frontal sinus tenderness Respiratory: Normal inspiratory effort, diffuse expiratory wheezing throughout all lung fields.  No rales or rhonchi. Cardiovascular: Normal rate, regular rhythm no murmurs rubs or gallops GI: nondistended skin: No rash, skin intact Musculoskeletal: no deformities no edema, calves symmetric, nontender Neurologic: Alert & oriented x 3, no focal neuro deficits Psychiatric: Speech and behavior appropriate   ED Course   Medications - No data to  display  Orders Placed This Encounter  Procedures  . SARS CORONAVIRUS 2 (TAT 6-24 HRS) Nasopharyngeal Nasopharyngeal Swab    Standing Status:   Standing    Number of Occurrences:   1    Order Specific Question:   Is this test for diagnosis or screening    Answer:   Diagnosis of ill patient    Order Specific Question:   Symptomatic for COVID-19 as defined by CDC    Answer:   Yes    Order Specific Question:   Date of Symptom Onset    Answer:   04/02/2020    Order Specific Question:   Hospitalized for COVID-19    Answer:   Yes    Order Specific Question:   Admitted to ICU for COVID-19    Answer:   No  Order Specific Question:   Previously tested for COVID-19    Answer:   Yes    Order Specific Question:   Resident in a congregate (group) care setting    Answer:   No    Order Specific Question:   Employed in healthcare setting    Answer:   No    Order Specific Question:   Pregnant    Answer:   No    Order Specific Question:   Has patient completed COVID vaccination(s) (2 doses of Pfizer/Moderna 1 dose of [employer])    Answer:   No  . DG Chest 2 View    Standing Status:   Standing    Number of Occurrences:   1    Order Specific Question:   Reason for Exam (SYMPTOM  OR DIAGNOSIS REQUIRED)    Answer:   Cough, wheezing, shortness of breath 6 days.  Rule out pneumonia versus CHF.    Results for orders placed or performed during the hospital encounter of 04/06/20 (from the past 24 hour(s))  SARS CORONAVIRUS 2 (TAT 6-24 HRS) Nasopharyngeal Nasopharyngeal Swab     Status: None   Collection Time: 04/06/20  6:39 PM   Specimen: Nasopharyngeal Swab  Result Value Ref Range   SARS Coronavirus 2 NEGATIVE NEGATIVE   DG Chest 2 View  Result Date: 04/06/2020 CLINICAL DATA:  Shortness of breath, wheezing EXAM: CHEST - 2 VIEW COMPARISON:  April 05, 2019 FINDINGS: The cardiomediastinal silhouette is unchanged and mildly enlarged in contour.Status post median sternotomy and CABG. Small  bilateral pleural effusions. No pneumothorax. Peribronchial cuffing and diffuse interstitial prominence as well as perihilar vascular fullness. Atherosclerotic calcifications of the aorta. Visualized abdomen is unremarkable. Multilevel degenerative changes of the thoracic spine. IMPRESSION: Constellation of findings are favored to reflect pulmonary edema with small bilateral pleural effusions. Differential considerations include atypical infection. Electronically Signed   By: Meda Klinefelter MD   On: 04/06/2020 14:12    ED Clinical Impression  1. Acute lower respiratory infection   2. Encounter for laboratory testing for COVID-19 virus      ED Assessment/Plan  Patient does not appear to be fluid overloaded.  She denies any symptoms consistent with CHF.  Suspect respiratory illness as it started with cough and sore throat.  Will check COVID and chest x-ray.  Reviewed imaging independently.  Peribronchial cuffing, small bilateral pleural effusions.  Pulmonary edema versus atypical infection.  See radiology report for full details.  Did not do flu testing as she is out of the window for Tamiflu.  COVID negative.  Suspect an infectious etiology as it started with sore throat and cough 6 days ago.  Patient will be a candidate for monoclonal antibody infusion based on age and comorbidities if COVID positive.  Plan to send home with regularly scheduled albuterol with a spacer for 4 days, then as needed.  Tessalon for the cough.  Because she has had symptoms for 6 days, has a history of chronic heart lung disease and diabetes, will treat as a typical respiratory infection/pneumonia with Augmentin ED for 5 days combined with azithromycin.  Deferring steroids in the absence of pulmonary disease and in the presence of diabetes.  Follow-up with PMD in 24-36 hours if not getting better, to the ER if she gets worse.   Discussed labs, imaging, MDM, treatment plan, and plan for follow-up with patient.  Discussed sn/sx that should prompt return to the ED. patient agrees with plan.   Meds ordered this encounter  Medications  . albuterol (VENTOLIN HFA) 108 (90 Base) MCG/ACT inhaler    Sig: Inhale 2 puffs into the lungs every 4 (four) hours as needed for wheezing or shortness of breath. Dispense with aerochamber    Dispense:  1 each    Refill:  0  . azithromycin (ZITHROMAX) 250 MG tablet    Sig: Take 1 tablet (250 mg total) by mouth daily. 2 tabs po on day 1, 1 tab po on days 2-5    Dispense:  6 tablet    Refill:  0  . amoxicillin-clavulanate (AUGMENTIN) 875-125 MG tablet    Sig: Take 1 tablet by mouth 2 (two) times daily for 5 days.    Dispense:  10 tablet    Refill:  0  . Spacer/Aero-Holding Chambers (AEROCHAMBER PLUS) inhaler    Sig: Use with inhaler    Dispense:  1 each    Refill:  2    Please educate patient on use    *This clinic note was created using Dragon dictation software. Therefore, there may be occasional mistakes despite careful proofreading.   ?    Domenick GongMortenson, Verdie Wilms, MD 04/07/20 [phone]

## 2020-04-06 NOTE — Discharge Instructions (Addendum)
Your Covid test will be back in 6 to 24 hours.  2 puffs from your albuterol inhaler using your spacer every 4 hours for 2 days, then every 6 hours for 2 days, then as needed.  If you start feeling better, you may back off on it.  The antibiotics are for an atypical pneumonia.  Get a pulse oximeter and keep track of your oxygen levels.  Go to the ER if they are consistently below 90%.

## 2020-04-07 ENCOUNTER — Ambulatory Visit: Payer: Medicare Other | Admitting: Family Medicine

## 2020-04-07 LAB — SARS CORONAVIRUS 2 (TAT 6-24 HRS): SARS Coronavirus 2: NEGATIVE

## 2020-04-16 ENCOUNTER — Other Ambulatory Visit: Payer: Self-pay

## 2020-04-16 ENCOUNTER — Ambulatory Visit (INDEPENDENT_AMBULATORY_CARE_PROVIDER_SITE_OTHER): Payer: Medicare Other | Admitting: Podiatry

## 2020-04-16 ENCOUNTER — Encounter: Payer: Self-pay | Admitting: Podiatry

## 2020-04-16 DIAGNOSIS — M79675 Pain in left toe(s): Secondary | ICD-10-CM | POA: Diagnosis not present

## 2020-04-16 DIAGNOSIS — E1042 Type 1 diabetes mellitus with diabetic polyneuropathy: Secondary | ICD-10-CM

## 2020-04-16 DIAGNOSIS — B351 Tinea unguium: Secondary | ICD-10-CM | POA: Diagnosis not present

## 2020-04-16 DIAGNOSIS — L84 Corns and callosities: Secondary | ICD-10-CM | POA: Diagnosis not present

## 2020-04-16 DIAGNOSIS — M79674 Pain in right toe(s): Secondary | ICD-10-CM | POA: Diagnosis not present

## 2020-04-18 NOTE — Progress Notes (Signed)
Subjective: Natasha Hernandez presents today at risk foot care. Pt has h/o NIDDM with PAD and painful callus(es) right foot and painful mycotic toenails b/l that are difficult to trim. Pain interferes with ambulation. Aggravating factors include wearing enclosed shoe gear. Pain is relieved with periodic professional debridement.   Her granddaughter is present during today's visit.  She is followed by Dr. Kirke Corin for Cardiology and Dr. Lucianne Muss for Endocrinology.  Jeanice Lim, Velna Hatchet, MD is patient's PCP.  Past Medical History:  Diagnosis Date  . Carotid artery occlusion   . Coronary artery disease    CABG in 1996. Most recent cardiac catheterization in 2013 showed patent grafts including LIMA to LAD, SVG to OM, SVG to diagonal and SVG to PDA.  . Diabetes mellitus without complication (HCC)   . Hyperlipidemia   . Hypertension   . PAD (peripheral artery disease) (HCC)      Current Outpatient Medications on File Prior to Visit  Medication Sig Dispense Refill  . albuterol (VENTOLIN HFA) 108 (90 Base) MCG/ACT inhaler Inhale 2 puffs into the lungs every 4 (four) hours as needed for wheezing or shortness of breath. Dispense with aerochamber 1 each 0  . aspirin EC 81 MG tablet Take 1 tablet (81 mg total) by mouth daily. 90 tablet 3  . azithromycin (ZITHROMAX) 250 MG tablet Take 1 tablet (250 mg total) by mouth daily. 2 tabs po on day 1, 1 tab po on days 2-5 6 tablet 0  . Calcium-Magnesium-Vitamin D (CALCIUM MAGNESIUM PO) Take 1 tablet by mouth daily.    . carvedilol (COREG) 6.25 MG tablet Take 1 tablet by mouth twice daily with meals. 60 tablet 5  . Coenzyme Q10 (CO Q 10 PO) Take 1 capsule by mouth daily.    . Continuous Blood Gluc Sensor (DEXCOM G6 SENSOR) MISC 1 each by Does not apply route See admin instructions. Use one sensor once every 10 days to monitor blood sugars.    . Continuous Blood Gluc Transmit (DEXCOM G6 TRANSMITTER) MISC 1 each by Does not apply route See admin instructions. Use one  transmitter once every 90 days to transmit blood sugars.    Marland Kitchen ezetimibe (ZETIA) 10 MG tablet Take 1 tablet by mouth daily. 90 tablet 0  . glucose blood test strip Use Contour Next test strips as instructed to check blood sugar 4 times daily. DX:E10.65 150 each 1  . HUMALOG 100 UNIT/ML cartridge Use max of 40 units daily via insulin pump. DX:E11.65 40 mL 2  . HUMALOG 100 UNIT/ML injection INJECT 40 UNITS SUBCUTANEOUSLY VIA OMNIPOD INSULIN PUMP 20 mL 0  . Insulin Disposable Pump (OMNIPOD DASH 5 PACK PODS) MISC 1 each by Other route See admin instructions. APPLY ONE POD EVERY 3 DAYS AS DIRECTED 10 each 11  . isosorbide mononitrate (IMDUR) 60 MG 24 hr tablet Take 1/2 tablet by mouth every morning, and, take 1 tablet by mouth every evening. 45 tablet 5  . lisinopril (ZESTRIL) 10 MG tablet Take 1 tablet by mouth daily. 90 tablet 0  . Multiple Vitamin (MULTIVITAMIN) tablet Take 1 tablet by mouth daily.    . rosuvastatin (CRESTOR) 40 MG tablet Take 1 tablet by mouth daily. 90 tablet 0  . Spacer/Aero-Holding Chambers (AEROCHAMBER PLUS) inhaler Use with inhaler 1 each 2  . triamcinolone cream (KENALOG) 0.1 % Apply 1 application topically 2 (two) times daily. 30 g 0   No current facility-administered medications on file prior to visit.     No Known Allergies  Objective:  Marriana Adalay Azucena is a pleasant 79 y.o. Caucasian female, WD, WN in NAD. AAO x 3.  There were no vitals filed for this visit.  Vascular Examination: Neurovascular status unchanged b/l lower extremities. Capillary refill time to digits immediate b/l. Palpable DP pulses b/l. Palpable PT pulses b/l. Pedal hair present b/l. Skin temperature gradient within normal limits b/l.  Dermatological Examination: Pedal skin is thin shiny, atrophic b/l lower extremities. No open wounds bilaterally. No interdigital macerations bilaterally. Toenails 1-5 left, R hallux, R 3rd toe, R 4th toe and R 5th toe elongated, discolored, dystrophic, thickened,  and crumbly with subungual debris and tenderness to dorsal palpation. Anonychia noted R 2nd toe. Nailbed(s) epithelialized.  Hyperkeratotic lesion(s) submet head 1 right foot and submet head 5 right foot.  No erythema, no edema, no drainage, no fluctuance.   Musculoskeletal: Normal muscle strength 5/5 to all lower extremity muscle groups bilaterally. No pain crepitus or joint limitation noted with ROM b/l. Hammertoes noted to the 1-5 bilaterally.   Fixed inversion deformity right ankle. Decreased eversion right foot.  Neurological Examination: Protective sensation diminished with 10g monofilament b/l. Vibratory sensation intact b/l. Proprioception intact bilaterally. Babinski reflex negative b/l. Clonus negative b/l.  Assessment: 1. Pain due to onychomycosis of toenails of both feet   2. Callus   3. 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)     Plan: -Examined patient. -Continue diabetic foot care principles.  -Toenails 1-5 left foot, right hallux and digits 3-5 right were debrided in length and girth with sterile nail nippers and dremel without iatrogenic bleeding.  -Hyperkeratotic lesions pared submet heads 1, 5 right foot utilizing sterile scalpel blade without incident. Pedorthist offloaded calluses for right foot on today's visit. -Patient to continue soft, supportive shoe gear daily. -Patient to report any pedal injuries to medical professional immediately. -Patient/POA to call should there be question/concern in the interim.  Return in about 9 weeks (around 06/18/2020).  Freddie Breech, DPM

## 2020-04-29 ENCOUNTER — Other Ambulatory Visit: Payer: Self-pay | Admitting: Endocrinology

## 2020-05-01 NOTE — Telephone Encounter (Signed)
Please have patient check with the Om[employer] company about where to get her pump supplies, apparently not covered at [facility]

## 2020-05-01 NOTE — Telephone Encounter (Signed)
Patients daughter called to follow up on call this morning, I did inform her that I saw that we received a fax to do a PA on the pods, asked Dr Remus Blake nurse if we could go ahead and start that process - I also called and LVM to [employer] to ask if she had any pods for patient in the mean time. FYI

## 2020-05-01 NOTE — Telephone Encounter (Signed)
Patients daughter called stating the patient is out of Omnipod dash pods and she did not realize the cost was going to be around $600 when she went to pick it up. I read her the message from Dr Lucianne Muss and told her to go ahead and ask the pharmacy (since she was there already) to send a PA, but to also call the insurance and [employer] to see about if it could be covered if sent to a different pharmacy.   Do we have any omnipod dash pod samples in office?

## 2020-05-01 NOTE — Telephone Encounter (Signed)
No dash pods.  You can try to call [employer]. She is the rep. For [employer].  She may have some.  Her number is [phone]

## 2020-05-02 NOTE — Telephone Encounter (Signed)
PA will need to be done unless her insurance prefers a different supplier

## 2020-05-06 NOTE — Telephone Encounter (Signed)
Patient was able to get a pod from [employer] [facility]) until she gets her Rx filled. (FYI)

## 2020-05-06 NOTE — Telephone Encounter (Signed)
noted 

## 2020-05-06 NOTE — Telephone Encounter (Signed)
Have you sorted out the problem with her insurance or supplier

## 2020-05-08 ENCOUNTER — Telehealth: Payer: Self-pay | Admitting: *Deleted

## 2020-05-09 NOTE — Telephone Encounter (Signed)
This has been approved through her insurance

## 2020-05-12 ENCOUNTER — Encounter (INDEPENDENT_AMBULATORY_CARE_PROVIDER_SITE_OTHER): Payer: Medicare Other | Admitting: Ophthalmology

## 2020-05-14 ENCOUNTER — Encounter (INDEPENDENT_AMBULATORY_CARE_PROVIDER_SITE_OTHER): Payer: Medicare Other | Admitting: Ophthalmology

## 2020-05-15 ENCOUNTER — Other Ambulatory Visit: Payer: Self-pay | Admitting: Endocrinology

## 2020-05-19 ENCOUNTER — Ambulatory Visit (INDEPENDENT_AMBULATORY_CARE_PROVIDER_SITE_OTHER): Payer: Medicare Other | Admitting: Ophthalmology

## 2020-05-19 ENCOUNTER — Other Ambulatory Visit: Payer: Self-pay

## 2020-05-19 ENCOUNTER — Encounter (INDEPENDENT_AMBULATORY_CARE_PROVIDER_SITE_OTHER): Payer: Self-pay | Admitting: Ophthalmology

## 2020-05-19 DIAGNOSIS — E103552 Type 1 diabetes mellitus with stable proliferative diabetic retinopathy, left eye: Secondary | ICD-10-CM | POA: Diagnosis not present

## 2020-05-19 DIAGNOSIS — H35371 Puckering of macula, right eye: Secondary | ICD-10-CM | POA: Diagnosis not present

## 2020-05-19 DIAGNOSIS — E103512 Type 1 diabetes mellitus with proliferative diabetic retinopathy with macular edema, left eye: Secondary | ICD-10-CM

## 2020-05-19 DIAGNOSIS — H35372 Puckering of macula, left eye: Secondary | ICD-10-CM | POA: Diagnosis not present

## 2020-05-19 DIAGNOSIS — E113552 Type 2 diabetes mellitus with stable proliferative diabetic retinopathy, left eye: Secondary | ICD-10-CM

## 2020-05-19 DIAGNOSIS — E113551 Type 2 diabetes mellitus with stable proliferative diabetic retinopathy, right eye: Secondary | ICD-10-CM

## 2020-05-19 LAB — HM DIABETES EYE EXAM

## 2020-05-19 NOTE — Assessment & Plan Note (Signed)

## 2020-05-19 NOTE — Assessment & Plan Note (Signed)
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 The patient was advised to maintain good glucose, blood pressure, monitor kidney function and serum lipid control as advised by personal physician. Rare risk for reactivation of progression exist with untreated severe anemia, untreated renal failure, untreated heart failure, and smoking. Complete avoidance of smoking was recommended. The chance of recurrent proliferative diabetic retinopathy was discussed as well as the chance of vitreous hemorrhage for which further treatments may be necessary.   Explained to the patient that the quiescent  proliferative diabetic retinopathy disease is unlikely to ever worsen.  Worsening factors would include however severe anemia, hypertension out-of-control or impending renal failure.

## 2020-05-19 NOTE — Assessment & Plan Note (Addendum)
epiretinal membrane, no topographic distortion to the fovea.

## 2020-05-19 NOTE — Assessment & Plan Note (Signed)
100 epiretinal membrane with no significant topographic distortion just only minor thickening OD

## 2020-05-19 NOTE — Progress Notes (Signed)
05/19/2020     CHIEF COMPLAINT Patient presents for Retina Follow Up (9 Month PDR f\u. OCT/Pt states vision is stable. Denies FOL and floaters./BGL: 140/A1C: 8.3)   HISTORY OF PRESENT ILLNESS: Natasha Hernandez is a 80 y.o. female who presents to the clinic today for:   HPI    Retina Follow Up    Patient presents with  Diabetic Retinopathy.  In right eye.  Severity is moderate.  Duration of 9 months.  Since onset it is stable.  I, the attending physician,  performed the HPI with the patient and updated documentation appropriately. Additional comments: 9 Month PDR f\u. OCT Pt states vision is stable. Denies FOL and floaters. BGL: 140 A1C: 8.3       Last edited by Elyse Jarvis on 05/19/2020  3:08 PM. (History)      Referring physician: Salley Scarlet, MD [address] [place],  [place] [address]  HISTORICAL INFORMATION:   Selected notes from the MEDICAL RECORD NUMBER    Lab Results  Component Value Date   HGBA1C 8.3 (H) 02/18/2020     CURRENT MEDICATIONS: No current outpatient medications on file. (Ophthalmic Drugs)   No current facility-administered medications for this visit. (Ophthalmic Drugs)   Current Outpatient Medications (Other)  Medication Sig  . albuterol (VENTOLIN HFA) 108 (90 Base) MCG/ACT inhaler Inhale 2 puffs into the lungs every 4 (four) hours as needed for wheezing or shortness of breath. Dispense with aerochamber  . aspirin EC 81 MG tablet Take 1 tablet (81 mg total) by mouth daily.  Marland Kitchen azithromycin (ZITHROMAX) 250 MG tablet Take 1 tablet (250 mg total) by mouth daily. 2 tabs po on day 1, 1 tab po on days 2-5  . Calcium-Magnesium-Vitamin D (CALCIUM MAGNESIUM PO) Take 1 tablet by mouth daily.  . carvedilol (COREG) 6.25 MG tablet Take 1 tablet by mouth twice daily with meals.  . Coenzyme Q10 (CO Q 10 PO) Take 1 capsule by mouth daily.  . Continuous Blood Gluc Sensor (DEXCOM G6 SENSOR) MISC 1 each by Does not apply route See admin instructions.  Use one sensor once every 10 days to monitor blood sugars.  . Continuous Blood Gluc Transmit (DEXCOM G6 TRANSMITTER) MISC 1 each by Does not apply route See admin instructions. Use one transmitter once every 90 days to transmit blood sugars.  Marland Kitchen ezetimibe (ZETIA) 10 MG tablet Take 1 tablet by mouth daily.  Marland Kitchen glucose blood test strip Use Contour Next test strips as instructed to check blood sugar 4 times daily. DX:E10.65  . HUMALOG 100 UNIT/ML cartridge Use max of 40 units daily via insulin pump. DX:E11.65  . HUMALOG 100 UNIT/ML injection INJECT 40 UNITS SUBCUTANEOUSLY VIA OMNIPOD INSULIN PUMP  . Insulin Disposable Pump (OMNIPOD DASH 5 PACK PODS) MISC APPLY ONE POD EVERY 3 DAYS AS DIRECTED  . isosorbide mononitrate (IMDUR) 60 MG 24 hr tablet Take 1/2 tablet by mouth every morning, and, take 1 tablet by mouth every evening.  Marland Kitchen lisinopril (ZESTRIL) 10 MG tablet Take 1 tablet by mouth daily.  . Multiple Vitamin (MULTIVITAMIN) tablet Take 1 tablet by mouth daily.  . rosuvastatin (CRESTOR) 40 MG tablet Take 1 tablet by mouth daily.  Marland Kitchen Spacer/Aero-Holding Chambers (AEROCHAMBER PLUS) inhaler Use with inhaler  . triamcinolone cream (KENALOG) 0.1 % Apply 1 application topically 2 (two) times daily.   No current facility-administered medications for this visit. (Other)      REVIEW OF SYSTEMS: ROS    Positive for:  Endocrine   Last edited by Elyse Jarvis on 05/19/2020  3:08 PM. (History)       ALLERGIES No Known Allergies  PAST MEDICAL HISTORY Past Medical History:  Diagnosis Date  . Carotid artery occlusion   . Coronary artery disease    CABG in 1996. Most recent cardiac catheterization in 2013 showed patent grafts including LIMA to LAD, SVG to OM, SVG to diagonal and SVG to PDA.  . Diabetes mellitus without complication (HCC)   . Hyperlipidemia   . Hypertension   . PAD (peripheral artery disease) (HCC)    Past Surgical History:  Procedure Laterality Date  . CARDIAC CATHETERIZATION     . CORONARY ARTERY BYPASS GRAFT  1996  . EYE SURGERY      FAMILY HISTORY Family History  Problem Relation Age of Onset  . Breast cancer Mother 28  . Stroke Mother   . Emphysema Father 30  . Heart attack Father     SOCIAL HISTORY Social History   Tobacco Use  . Smoking status: Never Smoker  . Smokeless tobacco: Never Used  Vaping Use  . Vaping Use: Never used  Substance Use Topics  . Alcohol use: No         OPHTHALMIC EXAM: Base Eye Exam    Visual Acuity (Snellen - Linear)      Right Left   Dist cc 20/30 20/50 -1   Dist ph cc NI 20/30 -1   Correction: Glasses       Tonometry (Tonopen, 3:13 PM)      Right Left   Pressure 13 14       Pupils      Pupils Dark Light Shape React APD   Right PERRL 3 3 Round Minimal None   Left PERRL 3 3 Round Minimal None       Visual Fields (Counting fingers)      Left Right   Restrictions Partial outer superior temporal, superior nasal, inferior nasal deficiencies Partial outer superior temporal deficiency       Neuro/Psych    Oriented x3: Yes   Mood/Affect: Normal       Dilation    Both eyes: 1.0% Mydriacyl, 2.5% Phenylephrine @ 3:13 PM        Slit Lamp and Fundus Exam    External Exam      Right Left   External Normal Normal       Slit Lamp Exam      Right Left   Lids/Lashes Normal Normal   Conjunctiva/Sclera White and quiet White and quiet   Cornea Clear Clear   Anterior Chamber Deep and quiet Deep and quiet   Iris Round and reactive Round and reactive   Lens Posterior chamber intraocular lens, Open posterior capsule Posterior chamber intraocular lens, Open posterior capsule   Anterior Vitreous Normal Normal       Fundus Exam      Right Left   Posterior Vitreous Normal Clear media with vitreal papillary traction   Disc Normal Neovascularization of the disc devoid of vessels, white fibrotic, vit Pap traction   C/D Ratio 0.1 0.1   Macula Few microaneurysms,, Epiretinal membrane Few microaneurysms,,  Epiretinal membrane   Vessels PDR, quiescent PDR, quiescent   Periphery Good PRP retina attached Good PRP retina attached          IMAGING AND PROCEDURES  Imaging and Procedures for 05/19/20  OCT, Retina - OU - Both Eyes       Right Eye Quality was  good. Scan locations included subfoveal. Central Foveal Thickness: 373. Progression has been stable. Findings include epiretinal membrane.   Left Eye Quality was good. Scan locations included subfoveal. Central Foveal Thickness: 344. Findings include epiretinal membrane, abnormal foveal contour.   Notes Minor epiretinal membrane and thickening OU, overall stable, will observe                  ASSESSMENT/PLAN:  Left epiretinal membrane  epiretinal membrane, no topographic distortion to the fovea.  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 The patient was advised to maintain good glucose, blood pressure, monitor kidney function and serum lipid control as advised by personal physician. Rare risk for reactivation of progression exist with untreated severe anemia, untreated renal failure, untreated heart failure, and smoking. Complete avoidance of smoking was recommended. The chance of recurrent proliferative diabetic retinopathy was discussed as well as the chance of vitreous hemorrhage for which further treatments may be necessary.   Explained to the patient that the quiescent  proliferative diabetic retinopathy disease is unlikely to ever worsen.  Worsening factors would include however severe anemia, hypertension out-of-control or impending renal failure.      ICD-10-CM   1. Stable treated proliferative  diabetic retinopathy of right eye determined by examination associated with type 2 diabetes mellitus (HCC)  E11.3551 OCT, Retina - OU - Both Eyes  2. Stable treated proliferative diabetic retinopathy of left eye determined by examination associated with type 2 diabetes mellitus (HCC)  E11.3552 OCT, Retina - OU - Both Eyes  3. Left epiretinal membrane  H35.372   4. Right epiretinal membrane  H35.371   5. 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  Examination confirms quiescent's of proliferative diabetic retinopathy OU.  2.  No progression of epiretinal membrane right eye with macular thickening in the left eye with no thickening and no foveal distortion.  3.  Patient has potential refractive error of a modest or mild amount with a pinhole acuity improving left eye for 20/50 to 20/30.  Follow-up with regular eye doctor, [facility] [address]  Ophthalmic Meds Ordered this visit:  No orders of the defined types were placed in this encounter.      Return in about 9 months (around 02/16/2021) for DILATE OU, OCT, COLOR FP.  There are no Patient Instructions on file for this visit.   Explained the diagnoses, plan, and follow up with the patient and they expressed understanding.  Patient expressed understanding of the importance of proper follow up care.   Alford Highland Aadil Sur M.D. Diseases & Surgery of the Retina and Vitreous [facility] 05/19/20     Abbreviations: M myopia (nearsighted); A astigmatism; H hyperopia (farsighted); P presbyopia; Mrx spectacle prescription;  CTL contact lenses; OD right eye; OS left eye; OU both eyes  XT exotropia; ET esotropia; PEK punctate epithelial keratitis; PEE punctate epithelial erosions; DES dry eye syndrome; MGD meibomian gland dysfunction; ATs artificial tears; PFAT's preservative free  artificial tears; NSC nuclear sclerotic cataract; PSC posterior subcapsular cataract; ERM epi-retinal membrane; PVD posterior vitreous detachment; RD retinal detachment; DM diabetes  mellitus; DR diabetic retinopathy; NPDR non-proliferative diabetic retinopathy; PDR proliferative diabetic retinopathy; CSME clinically significant macular edema; DME diabetic macular edema; dbh dot blot hemorrhages; CWS cotton wool spot; POAG primary open angle glaucoma; C/D cup-to-disc ratio; HVF humphrey visual field; GVF goldmann visual field; OCT optical coherence tomography; IOP intraocular pressure; BRVO Branch retinal vein occlusion; CRVO central retinal vein occlusion; CRAO central retinal artery occlusion; BRAO branch retinal artery occlusion; RT retinal tear; SB scleral buckle; PPV pars plana vitrectomy; VH Vitreous hemorrhage; PRP panretinal laser photocoagulation; IVK intravitreal kenalog; VMT vitreomacular traction; MH Macular hole;  NVD neovascularization of the disc; NVE neovascularization elsewhere; AREDS age related eye disease study; ARMD age related macular degeneration; POAG primary open angle glaucoma; EBMD epithelial/anterior basement membrane dystrophy; ACIOL anterior chamber intraocular lens; IOL intraocular lens; PCIOL posterior chamber intraocular lens; Phaco/IOL phacoemulsification with intraocular lens placement; [place] photorefractive keratectomy; LASIK laser assisted in situ keratomileusis; HTN hypertension; DM diabetes mellitus; COPD chronic obstructive pulmonary disease

## 2020-05-20 ENCOUNTER — Other Ambulatory Visit: Payer: Self-pay | Admitting: *Deleted

## 2020-05-20 MED ORDER — INSULIN ASPART 100 UNIT/ML ~~LOC~~ SOLN: 40.0000 [IU] | Freq: Three times a day (TID) | SUBCUTANEOUS | 2 refills | Status: DC

## 2020-06-05 ENCOUNTER — Other Ambulatory Visit: Payer: Self-pay | Admitting: Endocrinology

## 2020-06-10 ENCOUNTER — Other Ambulatory Visit (INDEPENDENT_AMBULATORY_CARE_PROVIDER_SITE_OTHER): Payer: Medicare Other

## 2020-06-10 ENCOUNTER — Other Ambulatory Visit: Payer: Self-pay

## 2020-06-10 DIAGNOSIS — E1065 Type 1 diabetes mellitus with hyperglycemia: Secondary | ICD-10-CM

## 2020-06-10 DIAGNOSIS — E7800 Pure hypercholesterolemia, unspecified: Secondary | ICD-10-CM

## 2020-06-10 LAB — COMPREHENSIVE METABOLIC PANEL
ALT: 29 U/L (ref 0–35)
AST: 26 U/L (ref 0–37)
Albumin: 4.2 g/dL (ref 3.5–5.2)
Alkaline Phosphatase: 84 U/L (ref 39–117)
BUN: 30 mg/dL — ABNORMAL HIGH (ref 6–23)
CO2: 30 mEq/L (ref 19–32)
Calcium: 9.5 mg/dL (ref 8.4–10.5)
Chloride: 101 mEq/L (ref 96–112)
Creatinine, Ser: 0.88 mg/dL (ref 0.40–1.20)
GFR: 63.03 mL/min (ref >60.00)
Glucose, Bld: 155 mg/dL — ABNORMAL HIGH (ref 70–99)
Potassium: 4.8 mEq/L (ref 3.5–5.1)
Sodium: 137 mEq/L (ref 135–145)
Total Bilirubin: 0.4 mg/dL (ref 0.2–1.2)
Total Protein: 6.5 g/dL (ref 6.0–8.3)

## 2020-06-10 LAB — LIPID PANEL
Cholesterol: 131 mg/dL (ref 0–200)
HDL: 74.4 mg/dL (ref >39.00)
LDL Cholesterol: 43 mg/dL (ref 0–99)
NonHDL: 57.05
Total CHOL/HDL Ratio: 2
Triglycerides: 69 mg/dL (ref 0.0–149.0)
VLDL: 13.8 mg/dL (ref 0.0–40.0)

## 2020-06-11 LAB — HEMOGLOBIN A1C: Hgb A1c MFr Bld: 8.5 % — ABNORMAL HIGH (ref 4.6–6.5)

## 2020-06-13 ENCOUNTER — Encounter: Payer: Self-pay | Admitting: Endocrinology

## 2020-06-13 ENCOUNTER — Other Ambulatory Visit: Payer: Self-pay

## 2020-06-13 ENCOUNTER — Ambulatory Visit (INDEPENDENT_AMBULATORY_CARE_PROVIDER_SITE_OTHER): Payer: Medicare Other | Admitting: Endocrinology

## 2020-06-13 VITALS — BP 118/78 | HR 86 | Temp 97.8°F | Resp 16 | Ht 64.0 in | Wt 165.0 lb

## 2020-06-13 DIAGNOSIS — E1065 Type 1 diabetes mellitus with hyperglycemia: Secondary | ICD-10-CM | POA: Diagnosis not present

## 2020-06-13 DIAGNOSIS — E7800 Pure hypercholesterolemia, unspecified: Secondary | ICD-10-CM

## 2020-06-13 DIAGNOSIS — I10 Essential (primary) hypertension: Secondary | ICD-10-CM | POA: Diagnosis not present

## 2020-06-13 MED ORDER — CARVEDILOL 6.25 MG PO TABS: 6.2500 mg | ORAL_TABLET | Freq: Two times a day (BID) | ORAL | 0 refills | Status: DC

## 2020-06-13 MED ORDER — ISOSORBIDE MONONITRATE ER 60 MG PO TB24: ORAL_TABLET | ORAL | 0 refills | Status: DC

## 2020-06-13 NOTE — Progress Notes (Signed)
Patient ID: Natasha Hernandez, female   DOB: 08/27/1940, 80 y.o.   MRN: [id number]            Reason for Appointment : Follow-up for Type 1 Diabetes    History of Present Illness          Diagnosis: Type 1 diabetes mellitus, date of diagnosis: 1953          Previous history:  She thinks she has been on various insulin regimens over the years but has been on Lantus for 10-12 years along with mealtime insulin Previously followed by an endocrinologist but no records available. Her A1c has usually been around 7% reportedly and was 7.2 in 7/16   Recent history:    INSULIN PUMP settings BASAL rate midnight = 0.8, 3 AM = 0.6.  10 AM = 0.55, #1 PM = 0.70.  6 PM = 0.65 and 9 PM = 0.8 with total basal 15 units  Carbohydrate coverage 1: 7 breakfast and dinner, 1: 6 at lunch, sensitivity 1: 60 and active insulin 4 hours. Blood sugar target 135 and after 10 PM = 150 Correction threshold 160 at midnight and 175 after 10 p.m.  Current management, blood sugar patterns and problems identified:    Her A1c stays over 8% and now 8.5   She still has a difficult to control blood sugars with only 43% of her blood sugars within target range and most of them high  This is despite increasing her basal rate somewhat especially at midnight and late evening on the last visit and overall showing about the same patterns as before  As before she is concerned she occasionally gets low normal or low sugars around 4-5 AM and will try to keep her bedtime readings over 200  However blood sugars do not decline significantly overnight except on a few days in the last 2 weeks  Usually not having excessive postprandial hyperglycemia but this is variable  She is bolusing usually right after eating to better assess her intake but occasionally will bolus 30 minutes later  She will not eat breakfast in the morning if blood sugars are high for fear of further increase  She is trying to do some physical exercise  but low intensity  Weight is minimally lower   Mealtimes are: Breakfast: 8.30 AM, lunch 1-2 PM, Dinner: 7.30 Glucose monitoring:  is being done several times a day         Glucometer: Dexcom   CONTINUOUS GLUCOSE MONITORING RECORD INTERPRETATION of her G6 Dexcom for the last 2 weeks     Results statistics:   CGM use % of time  98  Average and SD  196/58  Time in range  43       %  % Time Above 180  57  % Time above 250  19  % Time Below target  0.3     Glycemic patterns summary: Blood sugars show significant variability especially overnight and on an average are above target range or upper normal most of the time with highest readings between about 10 PM-1 AM.  Lowest readings on an average before lunch; at lowest average blood sugars are still only 170 On an average highest blood sugars are at 1 AM with average 222 but a range of 77-329  Hyperglycemic episodes are occurring variably from 1 week to another but most significantly late evening after 8 PM until 2-3 AM Some of the high readings are from postprandial increase but not consistently  Hypoglycemic episodes occurred minimally but only transiently around 2 PM more 7 PM  Overnight periods: Blood sugars are mostly high with variability.  On the first week blood sugars were averaging in the upper 200 range and then sharply decreasing until about 7-8 AM without hypoglycemia  Preprandial periods: Blood sugars are modestly high at all times but on an average highest before dinner with some variability  Postprandial periods:   Has only occasional significant high blood sugars after one of her meals but no consistent rise after eating, overall best blood sugars are after breakfast  Previous data:  CGM use % of time  93  2-week average/SD  194+/-52  Time in range  45      %  % Time Above 180  40  % Time above 250  15  % Time Below 70      PRE-MEAL Fasting Lunch Dinner Bedtime Overall  Glucose range:       Averages:  184   177  179  222    POST-MEAL PC Breakfast PC Lunch PC Dinner  Glucose range:     Averages:  190   210     Factors causing Hypoglycemia: Overestimating mealtime coverage, excessive basal rate  Symptoms of hypoglycemia: Some weakness, confusion at times Treatment of hypoglycemia: Corn syrup or jellybeans/other sweets.  She will get 15 g of carbohydrate  Self-care: The diet that the patient has been following is: Carbohydrate counting  Mealtimes are: Breakfast AT 8.30 AM Dinne r: 7.30   For breakfast will have toast with cheese or egg, sometimes cereal and egg  Exercise: walking and that time will use her exercise bike        Dietician consultation: Most recent: Years ago .         CDE consultation: 04/2018   Wt Readings from Last 3 Encounters:  06/13/20 165 lb (74.8 kg)  02/22/20 168 lb 12.8 oz (76.6 kg)  12/07/19 167 lb 4 oz (75.9 kg)    Lab Results  Component Value Date   HGBA1C 8.5 (H) 06/10/2020   HGBA1C 8.3 (H) 02/18/2020   HGBA1C 8.4 (H) 11/05/2019   Lab Results  Component Value Date   MICROALBUR 1.2 09/07/2019   LDLCALC 43 06/10/2020   CREATININE 0.88 06/10/2020    Lab Results  Component Value Date   MICRALBCREAT 0.9 09/07/2019   MICRALBCREAT 4.5 08/18/2018     Allergies as of 06/13/2020   No Known Allergies     Medication List       Accurate as of June 13, 2020  9:35 AM. If you have any questions, ask your nurse or doctor.        STOP taking these medications   azithromycin 250 MG tablet Commonly known as: ZITHROMAX Stopped by: Reather Littler, MD     TAKE these medications   AeroChamber Plus inhaler Use with inhaler   albuterol 108 (90 Base) MCG/ACT inhaler Commonly known as: VENTOLIN HFA Inhale 2 puffs into the lungs every 4 (four) hours as needed for wheezing or shortness of breath. Dispense with aerochamber   aspirin EC 81 MG tablet Take 1 tablet (81 mg total) by mouth daily.   CALCIUM MAGNESIUM PO Take 1 tablet by mouth daily.    carvedilol 6.25 MG tablet Commonly known as: COREG Take 1 tablet by mouth twice daily with meals.   CO Q 10 PO Take 1 capsule by mouth daily.   Dexcom G6 Sensor Misc 1 each by Does not apply route  See admin instructions. Use one sensor once every 10 days to monitor blood sugars.   Dexcom G6 Transmitter Misc 1 each by Does not apply route See admin instructions. Use one transmitter once every 90 days to transmit blood sugars.   ezetimibe 10 MG tablet Commonly known as: ZETIA Take 1 tablet by mouth daily.   glucose blood test strip Use Contour Next test strips as instructed to check blood sugar 4 times daily. DX:E10.65   HumaLOG 100 UNIT/ML injection Generic drug: insulin lispro INJECT 40 UNITS SUBCUTANEOUSLY VIA OMNIPOD INSULIN PUMP   HumaLOG 100 UNIT/ML cartridge Generic drug: insulin lispro Use max of 40 units daily via insulin pump. DX:E11.65   insulin aspart 100 UNIT/ML injection Commonly known as: NovoLOG Inject 40 Units into the skin 3 (three) times daily before meals. Use 40 units per day with insulin pump. (replaces Humalog)   isosorbide mononitrate 60 MG 24 hr tablet Commonly known as: IMDUR Take 1/2 tablet by mouth every morning, and, take 1 tablet by mouth every evening.   lisinopril 10 MG tablet Commonly known as: ZESTRIL Take 1 tablet by mouth daily.   multivitamin tablet Take 1 tablet by mouth daily.   OmniPod Dash 5 Pack Pods Misc APPLY ONE POD EVERY 3 DAYS AS DIRECTED   rosuvastatin 40 MG tablet Commonly known as: CRESTOR Take 1 tablet by mouth daily.   triamcinolone 0.1 % Commonly known as: KENALOG Apply 1 application topically 2 (two) times daily.       Allergies: No Known Allergies  Past Medical History:  Diagnosis Date  . Carotid artery occlusion   . Coronary artery disease    CABG in 1996. Most recent cardiac catheterization in 2013 showed patent grafts including LIMA to LAD, SVG to OM, SVG to diagonal and SVG to PDA.  . Diabetes  mellitus without complication (HCC)   . Hyperlipidemia   . Hypertension   . PAD (peripheral artery disease) (HCC)     Past Surgical History:  Procedure Laterality Date  . CARDIAC CATHETERIZATION    . CORONARY ARTERY BYPASS GRAFT  1996  . EYE SURGERY      Family History  Problem Relation Age of Onset  . Breast cancer Mother [id number]  . Stroke Mother   . Emphysema Father 178  . Heart attack Father     Social History:  reports that she has never smoked. She has never used smokeless tobacco. She reports that she does not drink alcohol. No history on file for drug use.      Review of Systems                                                            Lipids:  She is on Crestor 40 mg daily and since 7/19 also on Zetia  Has history of CAD  several years ago with coronary bypass LDL is below 70 as follows   Lab Results  Component Value Date   CHOL 131 06/10/2020   HDL 74.40 06/10/2020   LDLCALC 43 06/10/2020   TRIG 69.0 06/10/2020   CHOLHDL 2 06/10/2020    Has had proliferative retinopathy, has been going regularly for exams  Neuropathy with some sensory loss: Has been prescribed diabetic shoes  HYPERTENSION:  This is treated with lisinopril 10 mg daily along with  low-dose Coreg   BP Readings from Last 3 Encounters:  06/13/20 118/78  04/06/20 (!) 152/65  02/22/20 124/78     Lab Results  Component Value Date   CREATININE 0.88 06/10/2020   CREATININE 1.05 02/18/2020   CREATININE 0.86 11/05/2019    History of Covid infection: No sequelae but she is refusing to consider the Covid vaccine She is however open to the flu vaccine Reminded her that the flu vaccine this year is different than what she had in January  LABS:  Lab on 06/10/2020  Component Date Value Ref Range Status  . Cholesterol 06/10/2020 131  0 - 200 mg/dL Final   ATP III Classification       Desirable:  < 200 mg/dL               Borderline High:  200 - 239 mg/dL          High:  > = 370 mg/dL  .  Triglycerides 06/10/2020 69.0  0.0 - 149.0 mg/dL Final   Normal:  <964 mg/dLBorderline High:  150 - 199 mg/dL  . HDL 06/10/2020 74.40  >39.00 mg/dL Final  . VLDL 38/38/1840 13.8  0.0 - 40.0 mg/dL Final  . LDL Cholesterol 06/10/2020 43  0 - 99 mg/dL Final  . Total CHOL/HDL Ratio 06/10/2020 2   Final                  Men          Women1/2 Average Risk     3.4          3.3Average Risk          5.0          4.42X Average Risk          9.6          7.13X Average Risk          15.0          11.0                      . NonHDL 06/10/2020 57.05   Final   NOTE:  Non-HDL goal should be 30 mg/dL higher than patient's LDL goal (i.e. LDL goal of < 70 mg/dL, would have non-HDL goal of < 100 mg/dL)  . Sodium 06/10/2020 137  135 - 145 mEq/L Final  . Potassium 06/10/2020 4.8  3.5 - 5.1 mEq/L Final  . Chloride 06/10/2020 101  96 - 112 mEq/L Final  . CO2 06/10/2020 30  19 - 32 mEq/L Final  . Glucose, Bld 06/10/2020 155* 70 - 99 mg/dL Final  . BUN 37/54/3606 30* 6 - 23 mg/dL Final  . Creatinine, Ser 06/10/2020 0.88  0.40 - 1.20 mg/dL Final  . Total Bilirubin 06/10/2020 0.4  0.2 - 1.2 mg/dL Final  . Alkaline Phosphatase 06/10/2020 84  39 - 117 U/L Final  . AST 06/10/2020 26  0 - 37 U/L Final  . ALT 06/10/2020 29  0 - 35 U/L Final  . Total Protein 06/10/2020 6.5  6.0 - 8.3 g/dL Final  . Albumin 77/06/4033 4.2  3.5 - 5.2 g/dL Final  . GFR 24/81/8590 63.03  >60.00 mL/min Final   Calculated using the CKD-EPI Creatinine Equation (2021)  . Calcium 06/10/2020 9.5  8.4 - 10.5 mg/dL Final  . Hgb B3J MFr Bld 06/10/2020 8.5* 4.6 - 6.5 % Final   Glycemic Control Guidelines for People with Diabetes:Non Diabetic:  <6%Goal of Therapy: <7%Additional  Action Suggested:  >8%      Physical Examination:  BP 118/78 (BP Location: Left Arm, Patient Position: Sitting, Cuff Size: Small)   Pulse 86   Temp 97.8 F (36.6 C) (Oral)   Resp 16   Ht 5\' 4"  (1.626 m)   Wt 165 lb (74.8 kg)   SpO2 98%   BMI 28.32 kg/m      ASSESSMENT:  Diabetes type 1, long-standing  A1c is 8.5  See history of present illness for analysis of current diabetes management, blood sugar patterns and problems identified  She is on an Omni pod insulin pump with Humalog insulin and using the Dexcom for CGM  She has only 43% of readings within target Again she is not wanting to be more aggressive with her control because of fear of hypoglycemia which has been minimal Bolus timing is usually somewhat late up to 30 minutes after eating  She mostly has excessive hyperglycemia late in the evenings and overnight She is currently not having any issues with hypoglycemia and is benefiting from the accuracy of the Dexcom and alerts which are also being sent to her daughter  Hypertension: Her blood pressure is well controlled  Renal function is stable  Lipids well controlled  PLAN:   Basal rates will be revised as follows 12 AM = 0.90 until 4 AM, 4 AM-8 AM = 0.70, 8 AM-11 AM = 0.60, 11 AM- 3 PM = 0.60, 3 PM-6 PM = 0.75 and 6 PM-9 PM = 0.65, 9 PM = 0.9 Her daughter will make these changes   Carbohydrate coverage 1: 10 at breakfast until 11 AM as before and then 1: 6 as before  Correction factor I: 35  Bolus consistently just before starting meals if knowing what she will eat otherwise right after  She will benefit from the closed-loop pump when it is available to improve her overall control and variability especially overnight  She will call if she has any tendency to starting low sugars   There are no Patient Instructions on file for this visit.   There are no Patient Instructions on file for this visit.     06/13/2020, 9:35 AM   Note: This note was prepared with Dragon voice recognition system technology. Any transcriptional errors that result from this process are unintentional.

## 2020-06-16 ENCOUNTER — Ambulatory Visit (INDEPENDENT_AMBULATORY_CARE_PROVIDER_SITE_OTHER): Payer: Medicare Other | Admitting: Podiatry

## 2020-06-16 ENCOUNTER — Other Ambulatory Visit: Payer: Self-pay

## 2020-06-16 DIAGNOSIS — M2042 Other hammer toe(s) (acquired), left foot: Secondary | ICD-10-CM | POA: Diagnosis not present

## 2020-06-16 DIAGNOSIS — E1042 Type 1 diabetes mellitus with diabetic polyneuropathy: Secondary | ICD-10-CM | POA: Diagnosis not present

## 2020-06-16 DIAGNOSIS — L89629 Pressure ulcer of left heel, unspecified stage: Secondary | ICD-10-CM | POA: Diagnosis not present

## 2020-06-16 DIAGNOSIS — M216X1 Other acquired deformities of right foot: Secondary | ICD-10-CM | POA: Diagnosis not present

## 2020-06-16 DIAGNOSIS — M2041 Other hammer toe(s) (acquired), right foot: Secondary | ICD-10-CM | POA: Diagnosis not present

## 2020-06-16 DIAGNOSIS — E1142 Type 2 diabetes mellitus with diabetic polyneuropathy: Secondary | ICD-10-CM

## 2020-06-16 NOTE — Progress Notes (Addendum)
Subjective: Patient presented to the office today to pick up diabetic shoes and 3 pair of custom molded inserts.  Objective: 1 pair of inserts were put in the shoes and the shoes were fitted to the patient. The patient states they are comfortable and free of defect. She was satisfied with the fit of the shoe. Instructions for break in and wear were dispensed. The patient signed the delivery documentation and the break in instruction form.  A: Diabetic peripheral neuropathy associated with type 2 diabetes mellitus      History of diabetic ulcer      Hammertoes 1-5 b/l  Plan: Patient was informed if any concerns or questions arise to call the office.  Misty Stanley Oaklen Thiam-CMA

## 2020-06-18 ENCOUNTER — Other Ambulatory Visit: Payer: Self-pay | Admitting: *Deleted

## 2020-06-18 MED ORDER — ROSUVASTATIN CALCIUM 40 MG PO TABS: 40.0000 mg | ORAL_TABLET | Freq: Every day | ORAL | 0 refills | Status: DC

## 2020-06-19 ENCOUNTER — Other Ambulatory Visit: Payer: Self-pay | Admitting: Endocrinology

## 2020-06-19 DIAGNOSIS — E7800 Pure hypercholesterolemia, unspecified: Secondary | ICD-10-CM

## 2020-06-26 ENCOUNTER — Telehealth: Payer: Self-pay | Admitting: Podiatry

## 2020-06-26 NOTE — Telephone Encounter (Signed)
If Natasha Hernandez is in [place] on 3/25, it may be worth movin her to my schedule for that day in case there is something he can do to the shoes. Also make sure she brings in all inserts as well. Thank you!

## 2020-06-26 NOTE — Telephone Encounter (Signed)
pts daughter left message for someone to call to discuss pts custom shoes..She has some questions.  Returned call and left message for her to call me back.

## 2020-06-26 NOTE — Telephone Encounter (Signed)
pts daughter returned call and states the tongue is not wide enough and the top strap is too short for pt to put the shoes on by herself. She also said when pt wears the shoes for to an hour she states it hurts her legs. The daughter understands that it maybe that it is correcting her walking therefore that is why the legs are hurting.The daughter stated she is wondering if it would be worth getting the shoes fixed because she is not sure pt would wear them. I told pts daughter to bring them to the appt with Dr Eloy End on 3.18 and we can discuss then with the doctor as well.

## 2020-07-01 ENCOUNTER — Ambulatory Visit: Payer: Medicare Other | Admitting: Cardiovascular Disease

## 2020-07-04 ENCOUNTER — Ambulatory Visit (INDEPENDENT_AMBULATORY_CARE_PROVIDER_SITE_OTHER): Payer: Medicare Other | Admitting: Podiatry

## 2020-07-04 ENCOUNTER — Other Ambulatory Visit: Payer: Self-pay

## 2020-07-04 DIAGNOSIS — B351 Tinea unguium: Secondary | ICD-10-CM | POA: Diagnosis not present

## 2020-07-04 DIAGNOSIS — M79674 Pain in right toe(s): Secondary | ICD-10-CM | POA: Diagnosis not present

## 2020-07-04 DIAGNOSIS — M79675 Pain in left toe(s): Secondary | ICD-10-CM

## 2020-07-04 DIAGNOSIS — E1142 Type 2 diabetes mellitus with diabetic polyneuropathy: Secondary | ICD-10-CM

## 2020-07-04 DIAGNOSIS — L84 Corns and callosities: Secondary | ICD-10-CM

## 2020-07-10 ENCOUNTER — Encounter: Payer: Self-pay | Admitting: Podiatry

## 2020-07-10 NOTE — Progress Notes (Signed)
Subjective: Natasha Hernandez presents today at risk foot care. Pt has h/o NIDDM with PAD and painful callus(es) right foot and painful mycotic toenails b/l that are difficult to trim. Pain interferes with ambulation. Aggravating factors include wearing enclosed shoe gear. Pain is relieved with periodic professional debridement.   Her daughter is present during today's visit. Ms. Placzek has brought back her custom diabetic shoes. Her proximal strap is too short and the shoes gap medially at the arch area.  Her blood glucose was 150 mg/dl this morning.  She is followed by Dr. Kirke Corin for Cardiology. Last visit was 06/08/2019.  Last visit was Dr. Lucianne Muss for Endocrinology. Last visit was 06/13/2020.  Jeanice Lim, Velna Hatchet, MD is patient's PCP.  No Known Allergies  Objective: Natasha Hernandez is a pleasant 80 y.o. Caucasian female, WD, WN in NAD. AAO x 3.  There were no vitals filed for this visit.  Vascular Examination: Neurovascular status unchanged b/l lower extremities. Capillary refill time to digits immediate b/l. Palpable DP pulses b/l. Palpable PT pulses b/l. Pedal hair present b/l. Skin temperature gradient within normal limits b/l.  Dermatological Examination: Pedal skin is thin shiny, atrophic b/l lower extremities. No open wounds bilaterally. No interdigital macerations bilaterally. Toenails 1-5 left, R hallux, R 3rd toe, R 4th toe and R 5th toe elongated, discolored, dystrophic, thickened, and crumbly with subungual debris and tenderness to dorsal palpation. Anonychia noted R 2nd toe. Nailbed(s) epithelialized.  Hyperkeratotic lesion(s) submet head 1 right foot and submet head 5 right foot.  No erythema, no edema, no drainage, no fluctuance.   Musculoskeletal: Normal muscle strength 5/5 to all lower extremity muscle groups bilaterally. No pain crepitus or joint limitation noted with ROM b/l. Hammertoes noted to the 1-5 bilaterally.   Fixed inversion deformity right ankle. Decreased  eversion right foot.  Assessment of her shoes indicate short proximal leather strap and she does have gapping of shoe at medial arch bilaterally.  Neurological Examination: Protective sensation diminished with 10g monofilament b/l. Vibratory sensation intact b/l. Proprioception intact bilaterally. Babinski reflex negative b/l. Clonus negative b/l.  Assessment: 1. Pain due to onychomycosis of toenails of both feet   2. Callus   3. Diabetic peripheral neuropathy associated with type 2 diabetes mellitus (HCC)     Plan: -Examined patient. -I did take her shoes and delivered them to [facility] in our Orthotics and Prosthetics Department indicating modifications that need to be made. They will call her when shoes are ready. -Continue diabetic foot care principles.  -Toenails 1-5 left foot, right hallux and digits 3-5 right were debrided in length and girth with sterile nail nippers and dremel without iatrogenic bleeding.  -Hyperkeratotic lesions pared submet heads 1, 5 right foot utilizing sterile scalpel blade without incident. Pedorthist offloaded calluses for right foot on today's visit. -Patient to continue soft, supportive shoe gear daily. -Patient to report any pedal injuries to medical professional immediately. -Patient/POA to call should there be question/concern in the interim.  Return in about 3 months (around 10/04/2020).  Freddie Breech, DPM

## 2020-07-11 ENCOUNTER — Other Ambulatory Visit: Payer: Self-pay

## 2020-07-11 ENCOUNTER — Ambulatory Visit (INDEPENDENT_AMBULATORY_CARE_PROVIDER_SITE_OTHER): Payer: Medicare Other

## 2020-07-11 DIAGNOSIS — I6521 Occlusion and stenosis of right carotid artery: Secondary | ICD-10-CM

## 2020-07-18 ENCOUNTER — Ambulatory Visit: Payer: Medicare Other | Admitting: Family

## 2020-07-30 ENCOUNTER — Other Ambulatory Visit: Payer: Medicare Other

## 2020-07-30 ENCOUNTER — Other Ambulatory Visit: Payer: Self-pay

## 2020-09-05 ENCOUNTER — Other Ambulatory Visit (INDEPENDENT_AMBULATORY_CARE_PROVIDER_SITE_OTHER): Payer: Medicare Other

## 2020-09-05 ENCOUNTER — Other Ambulatory Visit: Payer: Self-pay

## 2020-09-05 DIAGNOSIS — E1065 Type 1 diabetes mellitus with hyperglycemia: Secondary | ICD-10-CM

## 2020-09-05 LAB — BASIC METABOLIC PANEL
BUN: 24 mg/dL — ABNORMAL HIGH (ref 6–23)
CO2: 31 mEq/L (ref 19–32)
Calcium: 9.9 mg/dL (ref 8.4–10.5)
Chloride: 101 mEq/L (ref 96–112)
Creatinine, Ser: 0.91 mg/dL (ref 0.40–1.20)
GFR: 60.45 mL/min (ref >60.00)
Glucose, Bld: 200 mg/dL — ABNORMAL HIGH (ref 70–99)
Potassium: 4.4 mEq/L (ref 3.5–5.1)
Sodium: 139 mEq/L (ref 135–145)

## 2020-09-05 LAB — TSH: TSH: 4.9 u[IU]/mL — ABNORMAL HIGH (ref 0.35–4.50)

## 2020-09-05 LAB — MICROALBUMIN / CREATININE URINE RATIO
Creatinine,U: 100.6 mg/dL
Microalb Creat Ratio: 0.7 mg/g (ref 0.0–30.0)
Microalb, Ur: 0.7 mg/dL (ref 0.0–1.9)

## 2020-09-05 LAB — HEMOGLOBIN A1C: Hgb A1c MFr Bld: 8.6 % — ABNORMAL HIGH (ref 4.6–6.5)

## 2020-09-11 ENCOUNTER — Other Ambulatory Visit: Payer: Self-pay | Admitting: Cardiovascular Disease

## 2020-09-11 ENCOUNTER — Other Ambulatory Visit: Payer: Self-pay | Admitting: Endocrinology

## 2020-09-11 DIAGNOSIS — E7800 Pure hypercholesterolemia, unspecified: Secondary | ICD-10-CM

## 2020-09-12 ENCOUNTER — Ambulatory Visit (INDEPENDENT_AMBULATORY_CARE_PROVIDER_SITE_OTHER): Payer: Medicare Other

## 2020-09-12 ENCOUNTER — Ambulatory Visit (INDEPENDENT_AMBULATORY_CARE_PROVIDER_SITE_OTHER): Payer: Medicare Other | Admitting: Endocrinology

## 2020-09-12 ENCOUNTER — Other Ambulatory Visit: Payer: Self-pay

## 2020-09-12 ENCOUNTER — Encounter: Payer: Self-pay | Admitting: Endocrinology

## 2020-09-12 VITALS — BP 134/72 | HR 92 | Ht 64.0 in | Wt 165.0 lb

## 2020-09-12 DIAGNOSIS — E1065 Type 1 diabetes mellitus with hyperglycemia: Secondary | ICD-10-CM | POA: Diagnosis not present

## 2020-09-12 DIAGNOSIS — I6521 Occlusion and stenosis of right carotid artery: Secondary | ICD-10-CM | POA: Diagnosis not present

## 2020-09-12 DIAGNOSIS — I10 Essential (primary) hypertension: Secondary | ICD-10-CM | POA: Diagnosis not present

## 2020-09-12 DIAGNOSIS — E1142 Type 2 diabetes mellitus with diabetic polyneuropathy: Secondary | ICD-10-CM

## 2020-09-12 NOTE — Progress Notes (Signed)
Patient ID: Natasha Hernandez, female   DOB: 1940-06-11, 80 y.o.   MRN: [id number]            Reason for Appointment : Follow-up for Type 1 Diabetes    History of Present Illness          Diagnosis: Type 1 diabetes mellitus, date of diagnosis: 1953          Previous history:  She thinks she has been on various insulin regimens over the years but has been on Lantus for 10-12 years along with mealtime insulin Previously followed by an endocrinologist but no records available. Her A1c has usually been around 7% reportedly and was 7.2 in 7/16   Recent history:    INSULIN PUMP settings BASAL rate midnight = 0.8, 3 AM = 0.6.  10 AM = 0.55, #1 PM = 0.70.  6 PM = 0.65 and 9 PM = 0.8 with total basal 15 units  Carbohydrate coverage 1: 10 breakfast and 1: 7 dinner, 1: 6 at lunch, sensitivity 1: 60 and active insulin 4 hours. Blood sugar target 135 and after 10 PM = 150 Correction threshold 160 at midnight and 175 after 10 p.m.  Current management, blood sugar patterns and problems identified:    Her A1c stays over 8% and now 8.6   She did not increase her basal rate at midnight because of her maximum basal on her pump being set at 0.8  As discussed in the CGM below her blood sugars are within the target only about half the time and on an average mostly between 160-200 at any given time except late afternoon  As before she is concerned she occasionally gets low normal sugars around 3 -5 AM and tries to raise her bedtime readings over 200  With changing her carbohydrate coverage her blood sugars are not tending to drop after breakfast as before  She is bolusing usually soon after eating to better assess her intake and this may sometimes cause higher readings  Weight is about the same  She does only low intensity exercise   Mealtimes are: Breakfast: 8.30 AM, lunch 1-2 PM, Dinner: 7.30 Glucose monitoring:  is being done several times a day         Glucometer: Dexcom   CONTINUOUS  GLUCOSE MONITORING RECORD INTERPRETATION of her G6 Dexcom download for the last 30 days     Sensor data was not available except for 2 days in the first week but otherwise appears to be generally consistent  HIGHEST blood sugars are between about 9 PM-4 AM with moderate variability  POSTPRANDIAL spikes are occasionally present at lunch or supper time but not consistently  OVERNIGHT blood sugars are primarily high between midnight-3 AM and then declining slowly overall but again showing high variability  On an average blood sugars are the highest between 12-2 AM averaging 213 and the lowest between 3 PM-5 PM averaging 148  No hypoglycemia documented and only likely artifactual decrease around 9 PM  Premeal readings are quite variable but mildly increased at all times averaging around 180  Results statistics:   CGM use % of time  98  Average and SD  188/51, previously 196/58  Time in range  49, previously 43       %  % Time Above 180  50, previously 57  % Time above 250  12%, previously 19  % Time Below target  0.1      Factors causing Hypoglycemia: Overestimating mealtime coverage, excessive basal  rate  Symptoms of hypoglycemia: Some weakness, confusion at times Treatment of hypoglycemia: Corn syrup or jellybeans/other sweets.  She will get 15 g of carbohydrate  Self-care: The diet that the patient has been following is: Carbohydrate counting  Mealtimes are: Breakfast AT 8.30 AM Dinne r: 7.30   For breakfast will have toast with cheese or egg, sometimes cereal and egg  Exercise: walking and that time will use her exercise bike        Dietician consultation: Most recent: Years ago .         CDE consultation: 04/2018   Wt Readings from Last 3 Encounters:  09/12/20 165 lb (74.8 kg)  06/13/20 165 lb (74.8 kg)  02/22/20 168 lb 12.8 oz (76.6 kg)    Lab Results  Component Value Date   HGBA1C 8.6 (H) 09/05/2020   HGBA1C 8.5 (H) 06/10/2020   HGBA1C 8.3 (H) 02/18/2020   Lab  Results  Component Value Date   MICROALBUR 0.7 09/05/2020   LDLCALC 43 06/10/2020   CREATININE 0.91 09/05/2020    Lab Results  Component Value Date   MICRALBCREAT 0.7 09/05/2020   MICRALBCREAT 0.9 09/07/2019     Allergies as of 09/12/2020   No Known Allergies     Medication List       Accurate as of Sep 12, 2020 11:59 PM. If you have any questions, ask your nurse or doctor.        AeroChamber Plus inhaler Use with inhaler   albuterol 108 (90 Base) MCG/ACT inhaler Commonly known as: VENTOLIN HFA Inhale 2 puffs into the lungs every 4 (four) hours as needed for wheezing or shortness of breath. Dispense with aerochamber   aspirin EC 81 MG tablet Take 1 tablet (81 mg total) by mouth daily.   CALCIUM MAGNESIUM PO Take 1 tablet by mouth daily.   carvedilol 6.25 MG tablet Commonly known as: COREG Take 1 tablet by mouth twice daily with meals.   CO Q 10 PO Take 1 capsule by mouth daily.   Dexcom G6 Sensor Misc 1 each by Does not apply route See admin instructions. Use one sensor once every 10 days to monitor blood sugars.   Dexcom G6 Transmitter Misc 1 each by Does not apply route See admin instructions. Use one transmitter once every 90 days to transmit blood sugars.   ezetimibe 10 MG tablet Commonly known as: ZETIA Take 1 tablet by mouth once daily.   glucose blood test strip Use Contour Next test strips as instructed to check blood sugar 4 times daily. DX:E10.65   HumaLOG 100 UNIT/ML injection Generic drug: insulin lispro INJECT 40 UNITS SUBCUTANEOUSLY VIA OMNIPOD INSULIN PUMP   HumaLOG 100 UNIT/ML cartridge Generic drug: insulin lispro Use max of 40 units daily via insulin pump. DX:E11.65   insulin aspart 100 UNIT/ML injection Commonly known as: NovoLOG Inject 40 Units into the skin 3 (three) times daily before meals. Use 40 units per day with insulin pump. (replaces Humalog)   isosorbide mononitrate 60 MG 24 hr tablet Commonly known as: IMDUR Take  1/2 tablet by mouth every morning, and, take 1 tablet by mouth every evening.   lisinopril 10 MG tablet Commonly known as: ZESTRIL Take 1 tablet by mouth daily.   multivitamin tablet Take 1 tablet by mouth daily.   Omnipod DASH Pods (Gen 4) Misc APPLY ONE POD EVERY 3 DAYS AS DIRECTED   rosuvastatin 40 MG tablet Commonly known as: CRESTOR Take 1 tablet by mouth once daily.   triamcinolone  cream 0.1 % Commonly known as: KENALOG Apply 1 application topically 2 (two) times daily.       Allergies: No Known Allergies  Past Medical History:  Diagnosis Date  . Carotid artery occlusion   . Coronary artery disease    CABG in 1996. Most recent cardiac catheterization in 2013 showed patent grafts including LIMA to LAD, SVG to OM, SVG to diagonal and SVG to PDA.  . Diabetes mellitus without complication (HCC)   . Hyperlipidemia   . Hypertension   . PAD (peripheral artery disease) (HCC)     Past Surgical History:  Procedure Laterality Date  . CARDIAC CATHETERIZATION    . CORONARY ARTERY BYPASS GRAFT  1996  . EYE SURGERY      Family History  Problem Relation Age of Onset  . Breast cancer Mother 3  . Stroke Mother   . Emphysema Father 23  . Heart attack Father     Social History:  reports that she has never smoked. She has never used smokeless tobacco. She reports that she does not drink alcohol. No history on file for drug use.      Review of Systems                                                           Lab Results  Component Value Date   TSH 4.90 (H) 09/05/2020   TSH 3.70 10/23/2018   TSH 2.53 05/04/2016    Lipids:  She is on Crestor 40 mg daily and since 7/19 also on Zetia  Has history of CAD  several years ago with coronary bypass LDL is below 70 as follows   Lab Results  Component Value Date   CHOL 131 06/10/2020   HDL 74.40 06/10/2020   LDLCALC 43 06/10/2020   TRIG 69.0 06/10/2020   CHOLHDL 2 06/10/2020    Has had proliferative but stable  retinopathy, has been going regularly for exams  Neuropathy with some sensory loss: Has been prescribed diabetic shoes  HYPERTENSION:  This is controlled with lisinopril 10 mg daily along with low-dose Coreg   BP Readings from Last 3 Encounters:  09/12/20 134/72  06/13/20 118/78  04/06/20 (!) 152/65     Lab Results  Component Value Date   CREATININE 0.91 09/05/2020   CREATININE 0.88 06/10/2020   CREATININE 1.05 02/18/2020    History of Covid infection: No sequelae  LABS:  No visits with results within 1 Week(s) from this visit.  Latest known visit with results is:  Lab on 09/05/2020  Component Date Value Ref Range Status  . TSH 09/05/2020 4.90* 0.35 - 4.50 uIU/mL Final  . Microalb, Ur 09/05/2020 0.7  0.0 - 1.9 mg/dL Final  . Creatinine,U 30/12/2328 100.6  mg/dL Final  . Microalb Creat Ratio 09/05/2020 0.7  0.0 - 30.0 mg/g Final  . Sodium 09/05/2020 139  135 - 145 mEq/L Final  . Potassium 09/05/2020 4.4  3.5 - 5.1 mEq/L Final  . Chloride 09/05/2020 101  96 - 112 mEq/L Final  . CO2 09/05/2020 31  19 - 32 mEq/L Final  . Glucose, Bld 09/05/2020 200* 70 - 99 mg/dL Final  . BUN 07/62/2633 24* 6 - 23 mg/dL Final  . Creatinine, Ser 09/05/2020 0.91  0.40 - 1.20 mg/dL Final  . GFR 35/45/6256 60.45  >  60.00 mL/min Final   Calculated using the CKD-EPI Creatinine Equation (2021)  . Calcium 09/05/2020 9.9  8.4 - 10.5 mg/dL Final  . Hgb O7S MFr Bld 09/05/2020 8.6* 4.6 - 6.5 % Final   Glycemic Control Guidelines for People with Diabetes:Non Diabetic:  <6%Goal of Therapy: <7%Additional Action Suggested:  >8%      Physical Examination:  BP 134/72 (BP Location: Left Arm, Patient Position: Sitting, Cuff Size: Normal)   Pulse 92   Ht 5\' 4"  (1.626 m)   Wt 165 lb (74.8 kg)   SpO2 98%   BMI 28.32 kg/m     ASSESSMENT:  Diabetes type 1, long-standing with retinopathy  A1c is 8.  See history of present illness for analysis of current diabetes management, blood sugar patterns  and problems identified  She is on an Omni pod insulin pump with Humalog insulin and using the Dexcom for CGM  She has 50% of readings within target compared to 43% previously recently Again she is afraid of hypoglycemia especially overnight Also has significant variability from day-to-day Highest blood sugars are late at night and early part of the night No hypoglycemia recently Although postprandial readings are in not consistently higher she still has some postprandial spikes related to either inadequate or late boluses  Hypertension: Her blood pressure is well controlled  Microalbumin normal  Lipids well controlled as of last visit  PLAN:   Basal rates will be revised as follows 12 AM = 0.90 until 3 AM and at 3 AM until 10 AM = 0.6, consider increasing basal rate at 10 AM Maximum basal rate was adjusted to 1.5 No change in bolus settings Will look into the OmniPod 5 pump when approved by Medicare To call if she has any consistently high or low readings at any given time  There are no Patient Instructions on file for this visit.      09/14/2020, 9:15 PM   Note: This note was prepared with Dragon voice recognition system technology. Any transcriptional errors that result from this process are unintentional.

## 2020-09-26 NOTE — Progress Notes (Signed)
Patient seen by EJ with [facility] today for custom shoe adjustment.

## 2020-10-17 ENCOUNTER — Ambulatory Visit (INDEPENDENT_AMBULATORY_CARE_PROVIDER_SITE_OTHER): Payer: Medicare Other | Admitting: Podiatry

## 2020-10-17 ENCOUNTER — Other Ambulatory Visit: Payer: Self-pay

## 2020-10-17 ENCOUNTER — Encounter: Payer: Self-pay | Admitting: Podiatry

## 2020-10-17 DIAGNOSIS — E1142 Type 2 diabetes mellitus with diabetic polyneuropathy: Secondary | ICD-10-CM

## 2020-10-17 DIAGNOSIS — B351 Tinea unguium: Secondary | ICD-10-CM | POA: Diagnosis not present

## 2020-10-17 LAB — HM DIABETES FOOT EXAM

## 2020-10-17 NOTE — Progress Notes (Addendum)
  Subjective:  Patient ID: Natasha Hernandez, female    DOB: 03/03/1941,  MRN: [id number]  80 y.o. female presents with at risk foot care with history of diabetic neuropathy and callus(es) b/l feet and painful thick toenails that are difficult to trim. Painful toenails interfere with ambulation. Aggravating factors include wearing enclosed shoe gear. Pain is relieved with periodic professional debridement. Painful calluses are aggravated when weightbearing with and without shoegear. Pain is relieved with periodic professional debridement..    Patient's blood sugar was 140 mg/dl today.  She is accompanied by her daughter in law on today's visit. They are preparing for Ms. Didonato's granddaughter's wedding later this month.   Ms. Heater is inquiring about her new shoes.  PCP: Salley Scarlet, MD and last visit was: patient is unsure. She is also followed by Endocrinologist, Dr. Reather Littler for her diabetes. Last visit with Dr. Lucianne Muss was 09/12/2020.  Review of Systems: Negative except as noted in the HPI.   No Known Allergies  Objective:  There were no vitals filed for this visit. Constitutional Patient is a pleasant 79 y.o. Caucasian female WD, WN in NAD. AAO x 3.  Vascular Capillary refill time to digits immediate b/l. Palpable pedal pulses b/l LE. Pedal hair sparse. Lower extremity skin temperature gradient within normal limits. No cyanosis or clubbing noted.  Neurologic Normal speech. Protective sensation diminished with 10g monofilament b/l. Vibratory sensation intact b/l. Clonus negative b/l.  Dermatologic Pedal skin is thin shiny, atrophic b/l lower extremities. Toenails 1-5 left, R hallux, R 3rd toe, R 4th toe, and R 5th toe elongated, discolored, dystrophic, thickened, and crumbly with subungual debris and tenderness to dorsal palpation. Anonychia noted R 2nd toe. Nailbed(s) epithelialized.  Minimal callus formation submet head 2 left foot, submet head 1 right foot.  Orthopedic: Normal  muscle strength 5/5 to all lower extremity muscle groups bilaterally. Severe hammertoe deformity noted 1-5 left.   Hemoglobin A1C Latest Ref Rng & Units 09/05/2020 06/10/2020 02/18/2020 11/05/2019  HGBA1C 4.6 - 6.5 % 8.6(H) 8.5(H) 8.3(H) 8.4(H)  Some recent data might be hidden   Assessment:   1. Onychomycosis   2. Diabetic peripheral neuropathy associated with type 2 diabetes mellitus (HCC)    Plan:  Patient was evaluated and treated and all questions answered.  Onychomycosis with pain -Nails palliatively debridement as below. -Educated on self-care  Procedure: Nail Debridement Rationale: Pain Type of Debridement: manual, sharp debridement. Instrumentation: Nail nipper, rotary burr. Number of Nails: 9  -Examined patient. -Her diabetic shoes have not arrived. We will call her when we receive them. -Patient to continue soft, supportive shoe gear daily. -Toenails 1-5 left, R hallux, R 3rd toe, R 4th toe, and R 5th toe debrided in length and girth without iatrogenic bleeding with sterile nail nipper and dremel.  -Patient to report any pedal injuries to medical professional immediately. -Dispensed silicone toe pad for left 2nd digit hammertoe. Check digit in about 2 hours today. Apply every morning. Remove every evening. -Patient/POA to call should there be question/concern in the interim.  Return in about 3 months (around 01/17/2021).  Freddie Breech, DPM

## 2020-11-06 ENCOUNTER — Telehealth: Payer: Self-pay | Admitting: Podiatry

## 2020-11-06 NOTE — Telephone Encounter (Signed)
Diabetic shoes/inserts in.. lvm for pts daughter to call to schedule an appt to pick them up.. 

## 2020-11-09 ENCOUNTER — Other Ambulatory Visit: Payer: Self-pay | Admitting: Endocrinology

## 2020-11-12 ENCOUNTER — Other Ambulatory Visit: Payer: Self-pay

## 2020-11-12 ENCOUNTER — Telehealth: Payer: Self-pay | Admitting: Endocrinology

## 2020-11-12 ENCOUNTER — Ambulatory Visit (INDEPENDENT_AMBULATORY_CARE_PROVIDER_SITE_OTHER): Payer: Medicare Other | Admitting: Podiatry

## 2020-11-12 DIAGNOSIS — M2042 Other hammer toe(s) (acquired), left foot: Secondary | ICD-10-CM

## 2020-11-12 DIAGNOSIS — L84 Corns and callosities: Secondary | ICD-10-CM

## 2020-11-12 DIAGNOSIS — E1142 Type 2 diabetes mellitus with diabetic polyneuropathy: Secondary | ICD-10-CM

## 2020-11-12 DIAGNOSIS — E1065 Type 1 diabetes mellitus with hyperglycemia: Secondary | ICD-10-CM

## 2020-11-12 DIAGNOSIS — M2041 Other hammer toe(s) (acquired), right foot: Secondary | ICD-10-CM

## 2020-11-12 NOTE — Progress Notes (Signed)
The patient presented to the office today to pick up diabetic shoes and 3 pair diabetic custom inserts.  1 pair of inserts were put in the shoes and the shoes were fitted to the patient. The patient states they are comfortable and the left foot the hammertoe was rubbing in the shoe and I took all 3 pairs of the insert and grinded them down for the patient and the patient stated that it felt better and was free of defect. She was satisfied with the fit of the shoes. Instructions for break in and wear were dispensed. The patient signed the delivery documentation and break in instruction form  If any concerns or questions arise, she is instructed to call

## 2020-11-12 NOTE — Telephone Encounter (Signed)
[facility] called to ask if we could resend pt's humalog prescription and just add the diagnosis code to it, the last prescription was missing it. Please send back to:  [id number] -  ([place]), [place] - [address] Phone:  [phone]  Fax:  [phone]

## 2020-11-13 MED ORDER — HUMALOG 100 UNIT/ML IJ SOLN: INTRAMUSCULAR | 2 refills | Status: DC

## 2020-11-13 NOTE — Telephone Encounter (Signed)
Rx was resent 

## 2020-11-15 ENCOUNTER — Other Ambulatory Visit: Payer: Self-pay | Admitting: Endocrinology

## 2020-11-19 NOTE — Progress Notes (Signed)
Cardiology Office Note:    Date:  11/22/2020   ID:  Natasha Hernandez, DOB 11-28-1940, MRN [id number]  PCP:  Natasha Scarlet, MD  [facility] Cardiologist:  Natasha Bears, MD  [facility] Electrophysiologist:  None   Referring MD: Natasha Scarlet, MD   Chief Complaint: F/u carotid US  History of Present Illness:    Natasha Hernandez is a 80 y.o. female with a hx of CAD s/p CABG in 1996, DM1 with nephropathy, HTN, HLD, PAD, coronary disease who presents for follow-up.   Most recent cardiac cath in 2013 showed patent grafts including SVG-OM, SVG-diagonal, SVG-PDA and LIMA to LAD. She has known occluded right artery. Lifelong smoker. She has PAD with no significant claudication-noninvasive vascular evaluation 11/2015 with ABI 0.85 on the right and 1.1 on the left. Duplex with no obstructive aortoiliac or SFA disease. Noted one-vessel runoff below the knee bilaterally. Lexiscan Myoview January 2020 with no evidence of ischemia and normal EF  Last seen 12/07/19 and was doing well from a cardiac perspective. Ultrasound bilateral carotids 07/11/2020 showed chronically occluded right carotid artery no significant disease on the left side. Plan to repeat in a year.  Today, the patient reports she has been doing well since tha last visit. No chest pain or shortness of breath. No LLE, orthopnea, pnd, palpitations, lightheadedness or dizziness. No pain in her legs when she walks. She works outside and is fairly active. Diet is mostly home-cooked. She lives with son. Performs all ADLs.   Past Medical History:  Diagnosis Date   Carotid artery occlusion    Coronary artery disease    CABG in 1996. Most recent cardiac catheterization in 2013 showed patent grafts including LIMA to LAD, SVG to OM, SVG to diagonal and SVG to PDA.   Diabetes mellitus without complication (HCC)    Hyperlipidemia    Hypertension    PAD (peripheral artery disease) (HCC)     Past Surgical History:  Procedure  Laterality Date   CARDIAC CATHETERIZATION     CORONARY ARTERY BYPASS GRAFT  1996   EYE SURGERY      Current Medications: Current Meds  Medication Sig   albuterol (VENTOLIN HFA) 108 (90 Base) MCG/ACT inhaler Inhale 2 puffs into the lungs every 4 (four) hours as needed for wheezing or shortness of breath. Dispense with aerochamber   aspirin EC 81 MG tablet Take 1 tablet (81 mg total) by mouth daily.   Calcium-Magnesium-Vitamin D (CALCIUM MAGNESIUM PO) Take 1 tablet by mouth daily.   carvedilol (COREG) 6.25 MG tablet Take 1 tablet by mouth twice daily with meals.   Coenzyme Q10 (CO Q 10 PO) Take 1 capsule by mouth daily.   Continuous Blood Gluc Sensor (DEXCOM G6 SENSOR) MISC 1 each by Does not apply route See admin instructions. Use one sensor once every 10 days to monitor blood sugars.   Continuous Blood Gluc Transmit (DEXCOM G6 TRANSMITTER) MISC 1 each by Does not apply route See admin instructions. Use one transmitter once every 90 days to transmit blood sugars.   ezetimibe (ZETIA) 10 MG tablet Take 1 tablet by mouth once daily.   glucose blood test strip Use Contour Next test strips as instructed to check blood sugar 4 times daily. DX:E10.65   HUMALOG 100 UNIT/ML injection INJECT 40 UNITS SUBCUTANEOUSLY VIA OMNIPOD INSULIN PUMP   HUMALOG 100 UNIT/ML injection USE MAX OF 40 UNITS DAILY VIA INSULIN PUMP   insulin aspart (NOVOLOG) 100 UNIT/ML injection Inject 40 Units into the  skin 3 (three) times daily before meals. Use 40 units per day with insulin pump. (replaces Humalog)   Insulin Disposable Pump (OMNIPOD DASH 5 PACK PODS) MISC APPLY ONE POD EVERY 3 DAYS AS DIRECTED   isosorbide mononitrate (IMDUR) 60 MG 24 hr tablet Take 1/2 tablet by mouth every morning, and, take 1 tablet by mouth every evening.   lisinopril (ZESTRIL) 10 MG tablet Take 1 tablet by mouth daily.   Multiple Vitamin (MULTIVITAMIN) tablet Take 1 tablet by mouth daily.   rosuvastatin (CRESTOR) 40 MG tablet Take 1 tablet by  mouth once daily.   Spacer/Aero-Holding Chambers (AEROCHAMBER PLUS) inhaler Use with inhaler   triamcinolone cream (KENALOG) 0.1 % Apply 1 application topically 2 (two) times daily.     Allergies:   Patient has no known allergies.   Social History   Socioeconomic History   Marital status: Widowed    Spouse name: Not on file   Number of children: Not on file   Years of education: Not on file   Highest education level: Not on file  Occupational History   Occupation: Retired  Tobacco Use   Smoking status: Never   Smokeless tobacco: Never  Vaping Use   Vaping Use: Never used  Substance and Sexual Activity   Alcohol use: No   Drug use: Not on file   Sexual activity: Not on file  Other Topics Concern   Not on file  Social History Narrative   Pt lives w/ son, dtr-in-law and grandchildren.    Social Determinants of Health   Financial Resource Strain: Not on file  Food Insecurity: Not on file  Transportation Needs: Not on file  Physical Activity: Not on file  Stress: Not on file  Social Connections: Not on file     Family History: The patient's family history includes Breast cancer (age of onset: 6) in her mother; Emphysema (age of onset: 36) in her father; Heart attack in her father; Stroke in her mother.  ROS:   Please see the history of present illness.     All other systems reviewed and are negative.  EKGs/Labs/Other Studies Reviewed:    The following studies were reviewed today:  Vascular ultrasound carotid duplex bilaterally 07/11/2020 Summary:  Right Carotid: Evidence consistent with a total occlusion of the right  ICA.   Left Carotid: There was no evidence of thrombus, dissection,  atherosclerotic                plaque or stenosis in the cervical carotid system.   Vertebrals:  Bilateral vertebral arteries demonstrate antegrade flow.  Subclavians: Normal flow hemodynamics were seen in bilateral subclavian               arteries.   Myoview Lexiscan  05/01/2018 Nuclear stress EF: 58%. There was no ST segment deviation noted during stress. The study is normal. This is a low risk study. The left ventricular ejection fraction is normal (55-65%).    EKG:  EKG is  ordered today.  The ekg ordered today demonstrates NSR, 1st degree AV block, PRI ,  low voltage, LAD, TWI aVL, no new changes  Recent Labs: 06/10/2020: ALT 29 09/05/2020: BUN 24; Creatinine, Ser 0.91; Potassium 4.4; Sodium 139; TSH 4.90  Recent Lipid Panel    Component Value Date/Time   CHOL 131 06/10/2020 1546   TRIG 69.0 06/10/2020 1546   HDL 74.40 06/10/2020 1546   CHOLHDL 2 06/10/2020 1546   VLDL 13.8 06/10/2020 1546   LDLCALC 43 06/10/2020 1546  Physical Exam:    VS:  BP 110/68 (BP Location: Left Arm, Patient Position: Sitting, Cuff Size: Normal)   Pulse 93   Ht 5\' 4"  (1.626 m)   Wt 163 lb 6 oz (74.1 kg)   SpO2 98%   BMI 28.04 kg/m     Wt Readings from Last 3 Encounters:  11/21/20 163 lb 6 oz (74.1 kg)  09/12/20 165 lb (74.8 kg)  06/13/20 165 lb (74.8 kg)     GEN:  Well nourished, well developed in no acute distress HEENT: Normal NECK: No JVD; +carotid bruits LYMPHATICS: No lymphadenopathy CARDIAC: RRR, no murmurs, rubs, gallops RESPIRATORY:  Clear to auscultation without rales, wheezing or rhonchi  ABDOMEN: Soft, non-tender, non-distended MUSCULOSKELETAL:  No edema; No deformity  SKIN: Warm and dry NEUROLOGIC:  Alert and oriented x 3 PSYCHIATRIC:  Normal affect   ASSESSMENT:    1. Coronary artery disease involving native coronary artery of native heart without angina pectoris   2. Essential hypertension   3. Hyperlipidemia LDL goal <70   4. PAD (peripheral artery disease) (HCC)   5. Right carotid artery occlusion    PLAN:    In order of problems listed above:  CAD s/p CABG 1996 Cardiac cath in 2013 showed patent grafts. MPI 04/2018 was low risk, normal EF with no ischemia. Patient stays active throughout the day and has no anginal  symptoms. No indication for ischemic testing at this time.Continue Aspirin, BB, statin, Imdur.  PAD Patient denies claudication symptoms. Patient walks daily. Continue Aspirin, Zetia, Crestor.  Bilateral carotid disease 05/2018 duplex reviewed during visit. Known chronically right occluded carotid artery and no significant disease on the left side. Plan to repeat in 1 year.   HLD LDL 43 05/2020. Continue Zetia and Rosuvastatin.  HTN BP well controlled, 110/68. Cotinue current medications  First degree AV block New on EKG today. PRI 06/2020. She is on coreg 6.25mg  BID. Monitor at follow-up.    No changes, 6 month follow-up  Disposition: Follow up in 6 month(s) with Md/APP   Signed, Caide Campi , PA-C  11/22/2020 12:55 PM    [facility]

## 2020-11-21 ENCOUNTER — Ambulatory Visit (INDEPENDENT_AMBULATORY_CARE_PROVIDER_SITE_OTHER): Payer: Medicare Other | Admitting: Medical

## 2020-11-21 ENCOUNTER — Encounter: Payer: Self-pay | Admitting: Medical

## 2020-11-21 ENCOUNTER — Other Ambulatory Visit: Payer: Self-pay

## 2020-11-21 VITALS — BP 110/68 | HR 93 | Ht 64.0 in | Wt 163.4 lb

## 2020-11-21 DIAGNOSIS — I6521 Occlusion and stenosis of right carotid artery: Secondary | ICD-10-CM

## 2020-11-21 DIAGNOSIS — I2510 Atherosclerotic heart disease of native coronary artery without angina pectoris: Secondary | ICD-10-CM

## 2020-11-21 DIAGNOSIS — I10 Essential (primary) hypertension: Secondary | ICD-10-CM

## 2020-11-21 DIAGNOSIS — E785 Hyperlipidemia, unspecified: Secondary | ICD-10-CM | POA: Diagnosis not present

## 2020-11-21 DIAGNOSIS — I739 Peripheral vascular disease, unspecified: Secondary | ICD-10-CM | POA: Diagnosis not present

## 2020-11-21 NOTE — Patient Instructions (Signed)
Medication Instructions:   Your physician recommends that you continue on your current medications as directed. Please refer to the Current Medication list given to you today.  *If you need a refill on your cardiac medications before your next appointment, please call your pharmacy*   Lab Work:  None ordered  Testing/Procedures:  None ordered   Follow-Up: At [facility], you and your health needs are our priority.  As part of our continuing mission to provide you with exceptional heart care, we have created designated Provider Care Teams.  These Care Teams include your primary Cardiologist (physician) and Advanced Practice Providers (APPs -  Physician Assistants and Nurse Practitioners) who all work together to provide you with the care you need, when you need it.  We recommend signing up for the patient portal called "MyChart".  Sign up information is provided on this After Visit Summary.  MyChart is used to connect with patients for Virtual Visits (Telemedicine).  Patients are able to view lab/test results, encounter notes, upcoming appointments, etc.  Non-urgent messages can be sent to your provider as well.   To learn more about what you can do with MyChart, go to [email or web address].    Your next appointment:   6 month(s)  The format for your next appointment:   In Person  Provider:   Lorine Bears, MD

## 2020-12-10 ENCOUNTER — Other Ambulatory Visit: Payer: Self-pay | Admitting: Cardiovascular Disease

## 2020-12-10 ENCOUNTER — Other Ambulatory Visit: Payer: Self-pay | Admitting: Endocrinology

## 2020-12-10 DIAGNOSIS — E7800 Pure hypercholesterolemia, unspecified: Secondary | ICD-10-CM

## 2020-12-14 ENCOUNTER — Other Ambulatory Visit: Payer: Self-pay

## 2020-12-14 ENCOUNTER — Encounter (HOSPITAL_COMMUNITY): Payer: Self-pay | Admitting: Emergency Medicine

## 2020-12-14 ENCOUNTER — Ambulatory Visit (INDEPENDENT_AMBULATORY_CARE_PROVIDER_SITE_OTHER): Payer: Medicare Other

## 2020-12-14 ENCOUNTER — Ambulatory Visit (HOSPITAL_COMMUNITY)
Admission: EM | Admit: 2020-12-14 | Discharge: 2020-12-14 | Disposition: A | Discharge status: Home / Self Care | Payer: Medicare Other | Attending: Physician Assistant | Admitting: Physician Assistant

## 2020-12-14 DIAGNOSIS — M2042 Other hammer toe(s) (acquired), left foot: Secondary | ICD-10-CM

## 2020-12-14 DIAGNOSIS — L97521 Non-pressure chronic ulcer of other part of left foot limited to breakdown of skin: Secondary | ICD-10-CM | POA: Diagnosis not present

## 2020-12-14 DIAGNOSIS — E119 Type 2 diabetes mellitus without complications: Secondary | ICD-10-CM | POA: Diagnosis not present

## 2020-12-14 DIAGNOSIS — E1042 Type 1 diabetes mellitus with diabetic polyneuropathy: Secondary | ICD-10-CM

## 2020-12-14 MED ORDER — MUPIROCIN 2 % EX OINT: 1.0000 "application " | TOPICAL_OINTMENT | Freq: Every day | CUTANEOUS | 0 refills | Status: DC

## 2020-12-14 MED ORDER — DOXYCYCLINE HYCLATE 100 MG PO CAPS: 100.0000 mg | ORAL_CAPSULE | Freq: Two times a day (BID) | ORAL | 0 refills | Status: DC

## 2020-12-14 NOTE — ED Provider Notes (Signed)
[facility]    CSN: [id number] Arrival date & time: 12/14/20  1008      History   Chief Complaint Chief Complaint  Patient presents with   Toe Pain    HPI Natasha Hernandez is a 80 y.o. female.   Patient presents today accompanied by her daughter who provide the majority of history.  She has a prolonged history of hammer toe on her left foot that resulted pain due to pressure from footwear.  Recently, over the past couple weeks, this has become ulcerated and over the past week become inflamed and painful.  Pain is rated 6 on a 0-10 pain scale, localized to dorsal left second toe PIP, described as aching, worse with attempted ambulation, no alleviating factors identified.  She has taken ibuprofen and Neosporin without improvement of symptoms.  She has also tried altering her shoes by cutting a hole in them or wearing looser fitting shoes this is provided no relief.  She does have a podiatrist but has not seen them recently.  She denies any systemic symptoms including fever, nausea, vomiting, body aches, fatigue, malaise.  She is concerned the area is becoming infected as it is persistently painful and has become more erythematous.  She is a type I diabetic and reports that her blood sugar was 120 this morning; last A1c was approximately 8.5% but this was at goal that she is prone to hypoglycemic episodes overnight.  Reports overall her blood sugars have been well controlled.   Past Medical History:  Diagnosis Date   Carotid artery occlusion    Coronary artery disease    CABG in 1996. Most recent cardiac catheterization in 2013 showed patent grafts including LIMA to LAD, SVG to OM, SVG to diagonal and SVG to PDA.   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 determined by examination associated with type 1 diabetes mellitus (HCC) 08/13/2019   Stable treated proliferative diabetic retinopathy of left eye with macular edema determined by examination associated with type 1 diabetes mellitus (HCC) 08/13/2019   Pneumonia due to COVID-19 virus 03/27/2019   Onychomycosis of multiple toenails with type 2 diabetes mellitus and peripheral neuropathy (HCC) 09/29/2018   Pre-ulcerative calluses 09/29/2018   Type 1 diabetes mellitus (HCC) 06/02/2018   Degeneration of lumbar intervertebral disc 05/31/2018   Scoliosis deformity of spine 05/31/2018   Chest pain, rule out acute myocardial infarction 04/16/2018   AKI (acute kidney injury) (HCC) 04/16/2018   CAD (coronary artery disease) 09/27/2016   Hx of CABG 09/27/2016   Right carotid artery occlusion 09/27/2016   PAD (peripheral artery disease) (HCC) 09/27/2016   Essential hypertension 07/10/2016   Uncontrolled type 1 diabetes mellitus with hyperglycemia (HCC) 05/07/2016   Pure hypercholesterolemia 05/07/2016   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) 05/07/2016   Pain in finger of right hand 04/13/2016   Stiffness of finger joint of right hand 04/13/2016   Stiffness of right wrist joint 04/13/2016   Closed fracture of right distal radius 04/08/2016    Past Surgical History:  Procedure Laterality Date   CARDIAC CATHETERIZATION     CORONARY ARTERY BYPASS GRAFT  1996   EYE SURGERY  OB History   No obstetric history on file.      Home Medications    Prior to Admission medications   Medication Sig Start Date End Date Taking? Authorizing Provider  doxycycline (VIBRAMYCIN) 100 MG capsule Take 1 capsule (100 mg total) by mouth 2 (two) times daily. 12/14/20  Yes Breelynn Bankert, Denny Peon K, PA-C  mupirocin ointment (BACTROBAN) 2 % Apply 1 application topically daily. 12/14/20  Yes Keylan Costabile K,  PA-C  albuterol (VENTOLIN HFA) 108 (90 Base) MCG/ACT inhaler Inhale 2 puffs into the lungs every 4 (four) hours as needed for wheezing or shortness of breath. Dispense with aerochamber 04/06/20   Domenick Gong, MD  aspirin EC 81 MG tablet Take 1 tablet (81 mg total) by mouth daily. 03/25/17   Iran Ouch, MD  Calcium-Magnesium-Vitamin D (CALCIUM MAGNESIUM PO) Take 1 tablet by mouth daily.    [provider]  carvedilol (COREG) 6.25 MG tablet Take 1 tablet by mouth twice daily with meals. 12/10/20   Iran Ouch, MD  Coenzyme Q10 (CO Q 10 PO) Take 1 capsule by mouth daily.    [provider]  Continuous Blood Gluc Sensor (DEXCOM G6 SENSOR) MISC 1 each by Does not apply route See admin instructions. Use one sensor once every 10 days to monitor blood sugars.    [provider]  Continuous Blood Gluc Transmit (DEXCOM G6 TRANSMITTER) MISC 1 each by Does not apply route See admin instructions. Use one transmitter once every 90 days to transmit blood sugars.    [provider]  ezetimibe (ZETIA) 10 MG tablet Take 1 tablet by mouth once daily. 12/10/20   Reather Littler, MD  glucose blood test strip Use Contour Next test strips as instructed to check blood sugar 4 times daily. DX:E10.65 05/30/19   Reather Littler, MD  HUMALOG 100 UNIT/ML injection INJECT 40 UNITS SUBCUTANEOUSLY VIA OMNIPOD INSULIN PUMP 11/13/19   Reather Littler, MD  HUMALOG 100 UNIT/ML injection USE MAX OF 40 UNITS DAILY VIA INSULIN PUMP 11/13/20   Reather Littler, MD  insulin aspart (NOVOLOG) 100 UNIT/ML injection Inject 40 Units into the skin 3 (three) times daily before meals. Use 40 units per day with insulin pump. (replaces Humalog) 05/20/20   Reather Littler, MD  Insulin Disposable Pump (OMNIPOD DASH 5 PACK PODS) MISC APPLY ONE POD EVERY 3 DAYS AS DIRECTED 06/06/20   Reather Littler, MD  isosorbide mononitrate (IMDUR) 60 MG 24 hr tablet Take 1/2 tablet by mouth every morning, and, take 1 tablet by mouth every evening.  12/10/20   Iran Ouch, MD  lisinopril (ZESTRIL) 10 MG tablet Take 1 tablet by mouth daily. 11/17/20   Reather Littler, MD  Multiple Vitamin (MULTIVITAMIN) tablet Take 1 tablet by mouth daily.    [provider]  rosuvastatin (CRESTOR) 40 MG tablet Take 1 tablet by mouth once daily. 12/10/20   Reather Littler, MD  Spacer/Aero-Holding Chambers (AEROCHAMBER PLUS) inhaler Use with inhaler 04/06/20   Domenick Gong, MD  triamcinolone cream (KENALOG) 0.1 % Apply 1 application topically 2 (two) times daily. 10/13/19   Moshe Cipro, NP    Family History Family History  Problem Relation Age of Onset   Breast cancer Mother 29   Stroke Mother    Emphysema Father 18   Heart attack Father     Social History Social History   Tobacco Use   Smoking status: Never   Smokeless tobacco: Never  Vaping Use   Vaping Use: Never used  Substance Use Topics  Alcohol use: No     Allergies   Patient has no known allergies.   Review of Systems Review of Systems  Constitutional:  Positive for activity change. Negative for appetite change, fatigue and fever.  Respiratory:  Negative for cough and shortness of breath.   Cardiovascular:  Negative for chest pain.  Gastrointestinal:  Negative for abdominal pain, diarrhea, nausea and vomiting.  Musculoskeletal:  Positive for arthralgias, gait problem and joint swelling.  Skin:  Positive for wound.  Neurological:  Negative for dizziness, light-headedness and headaches.    Physical Exam Triage Vital Signs ED Triage Vitals  Enc Vitals Group     BP 12/14/20 1045 (!) 153/70     Pulse Rate 12/14/20 1045 98     Resp 12/14/20 1045 16     Temp 12/14/20 1045 98 F (36.7 C)     Temp Source 12/14/20 1045 Oral     SpO2 12/14/20 1045 94 %     Weight --      Height --      Head Circumference --      Peak Flow --      Pain Score 12/14/20 1043 6     Pain Loc --      Pain Edu? --      Excl. in GC? --    No data found.  Updated Vital  Signs BP (!) 153/70   Pulse 98   Temp 98 F (36.7 C) (Oral)   Resp 16   SpO2 94%   Visual Acuity Right Eye Distance:   Left Eye Distance:   Bilateral Distance:    Right Eye Near:   Left Eye Near:    Bilateral Near:     Physical Exam Vitals reviewed.  Constitutional:      General: She is awake. She is not in acute distress.    Appearance: Normal appearance. She is normal weight. She is not ill-appearing.     Comments: Very pleasant female appears stated age sitting comfortably in exam room  HENT:     Head: Normocephalic and atraumatic.  Cardiovascular:     Rate and Rhythm: Normal rate and regular rhythm.     Pulses:          Posterior tibial pulses are 2+ on the right side and 2+ on the left side.     Heart sounds: Normal heart sounds, S1 normal and S2 normal. No murmur heard.    Comments: Capillary refill within 2 seconds left toes. Pulmonary:     Effort: Pulmonary effort is normal.     Breath sounds: Normal breath sounds. No wheezing, rhonchi or rales.  Musculoskeletal:     Right lower leg: No edema.     Left lower leg: No edema.     Left foot: Decreased range of motion. Deformity present.  Feet:     Left foot:     Protective Sensation: 10 sites tested.  2 sites sensed.     Skin integrity: Ulcer, skin breakdown and erythema present. No warmth.     Toenail Condition: Left toenails are abnormally thick.     Comments: 1 cm ulcerated lesion noted overlying left second toe PIP with surrounding erythema.  Erythema extends toward foot.  No bleeding or drainage noted.  Area is tender to palpation without warmth. Psychiatric:        Behavior: Behavior is cooperative.      UC Treatments / Results  Labs (all labs ordered are listed, but only abnormal results are displayed)  Labs Reviewed - No data to display  EKG   Radiology DG Toe 2nd Left  Result Date: 12/14/2020 CLINICAL DATA:  Diabetic. Hammertoe deformity with redness and inflammation. Rule out osteomyelitis.  EXAM: LEFT SECOND TOE COMPARISON:  None. FINDINGS: There is no evidence of fracture or dislocation. Second toe hammertoe deformity. No focal areas of bone erosion identified to suggest osteomyelitis. There is no evidence of arthropathy or other focal bone abnormality. Soft tissues are unremarkable. IMPRESSION: 1. No acute findings.  No signs of osteomyelitis. 2. Second toe hammertoe deformity. Electronically Signed   By: Signa Kell M.D.   On: 12/14/2020 11:34    Procedures Procedures (including critical care time)  Medications Ordered in UC Medications - No data to display  Initial Impression / Assessment and Plan / UC Course  I have reviewed the triage vital signs and the nursing notes.  Pertinent labs & imaging results that were available during my care of the patient were reviewed by me and considered in my medical decision making (see chart for details).      X-ray obtained to rule out osteomyelitis given sudden worsening of pain showed no acute abnormality.  We will treat for cellulitis given worsening pain with associated erythema.  Patient was started on doxycycline 100 mg twice daily for 10 days with instruction to avoid prolonged sun exposure while on this medication due to for sensitivity associated with this medicine.  She was instructed to use Bactroban with dressing changes.  Encouraged her to wear loosefitting footwear to avoid pressure on this area.  Discussed the importance of following up with podiatry for further evaluation for ongoing management.  Discussed at length alarm symptoms with her and daughter that warrant emergent evaluation to which they expressed understanding.  Strict return precautions given to which daughter expressed understanding.  Strongly encouraged her to monitor her blood sugar and aim for tight glycemic control.  Final Clinical Impressions(s) / UC Diagnoses   Final diagnoses:  Toe ulcer, left, limited to breakdown of skin (HCC)  Type 1 diabetes  mellitus with diabetic polyneuropathy (HCC)  Hammer toe of left foot     Discharge Instructions      We are going to treat for an infection.  Please start doxycycline 100 mg twice daily for 10 days.  This can upset your stomach so please take it with food.  You should also stay out of the sun while on this medication as it can give you a bad sunburn.  I have also called in antibiotic ointment that you can use during dressing changes.  Please try to keep pressure off of this area by wearing loosefitting footwear.  If you develop any worsening symptoms including drainage from the lesion, fever, increased pain, spread of redness you need to be seen immediately.  I recommend you follow-up with your podiatrist as soon as possible.     ED Prescriptions     Medication Sig Dispense Auth. Provider   doxycycline (VIBRAMYCIN) 100 MG capsule Take 1 capsule (100 mg total) by mouth 2 (two) times daily. 20 capsule Ajanae Virag K, PA-C   mupirocin ointment (BACTROBAN) 2 % Apply 1 application topically daily. 22 g Sola Margolis K, PA-C      PDMP not reviewed this encounter.   Jeani Hawking, PA-C 12/14/20 1157

## 2020-12-14 NOTE — Discharge Instructions (Addendum)
We are going to treat for an infection.  Please start doxycycline 100 mg twice daily for 10 days.  This can upset your stomach so please take it with food.  You should also stay out of the sun while on this medication as it can give you a bad sunburn.  I have also called in antibiotic ointment that you can use during dressing changes.  Please try to keep pressure off of this area by wearing loosefitting footwear.  If you develop any worsening symptoms including drainage from the lesion, fever, increased pain, spread of redness you need to be seen immediately.  I recommend you follow-up with your podiatrist as soon as possible.

## 2020-12-14 NOTE — ED Triage Notes (Signed)
PT has a hammer toe that is red and inflamed per family. PT has been trying to care for it at home, but it has worsened over last 3 weeks. Concerned about infection

## 2020-12-21 IMAGING — MR MR HEAD WO/W CM
7 of 13 series · 21 of 48 positions shown · IV contrast (gadavist)
Comparison: None available.

CLINICAL DATA: Initial evaluation for acute shortness of breath,
weakness. Recently diagnosed with [id number].

EXAM:
MRI HEAD WITHOUT AND WITH CONTRAST
TECHNIQUE: Multiplanar, multiecho pulse sequences of the brain and surrounding
structures were obtained without and with intravenous contrast.
CONTRAST:  7mL GADAVIST GADOBUTROL 1 MMOL/ML IV SOLN

[Series 2: DWI · axial · 3.0mm · 0.94mm/px · z∈[-79,+68]mm · 6 of 100 slices shown (1 of 2)]
[im 1/100]
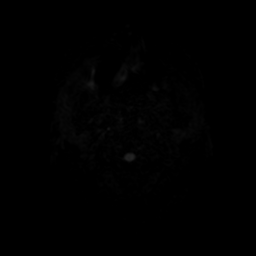
[im 20/100]
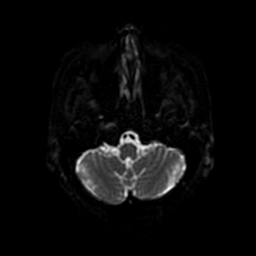
[im 40/100]
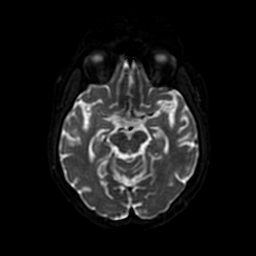
[im 60/100]
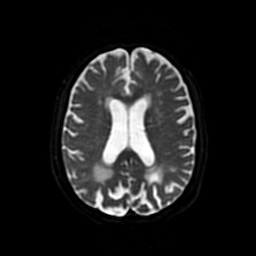
[im 80/100]
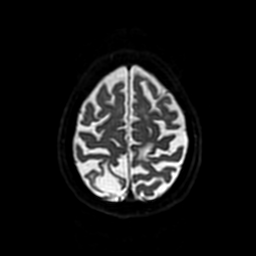
[im 100/100]
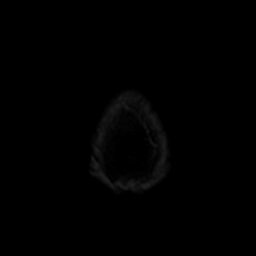

[Series 3: DWI · coronal · 4.0mm · 0.94mm/px · 4 of 74 slices shown (2 of 2)]
[im 1/74]
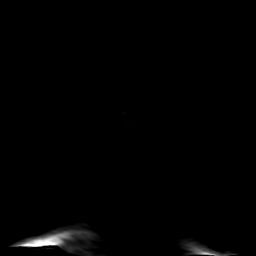
[im 25/74]
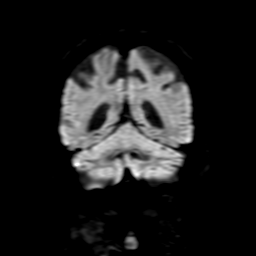
[im 49/74]
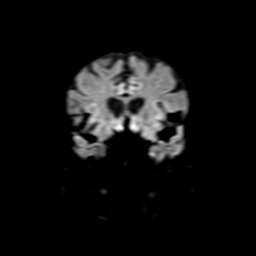
[im 74/74]
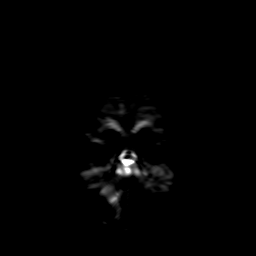

[Series 4: FLAIR · sagittal · 5.0mm · 0.23mm/px · 2 of 23 slices shown (1 of 2)]
[im 1/23]
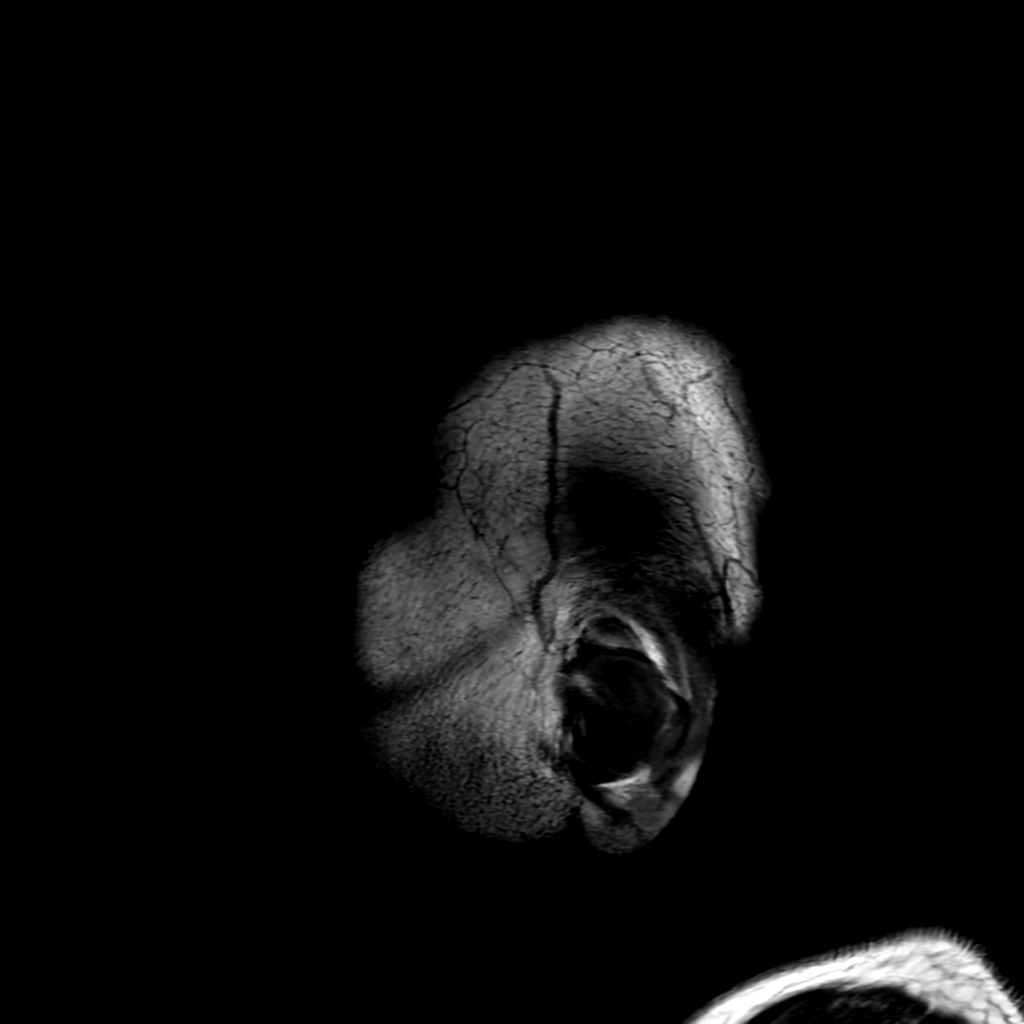
[im 23/23]
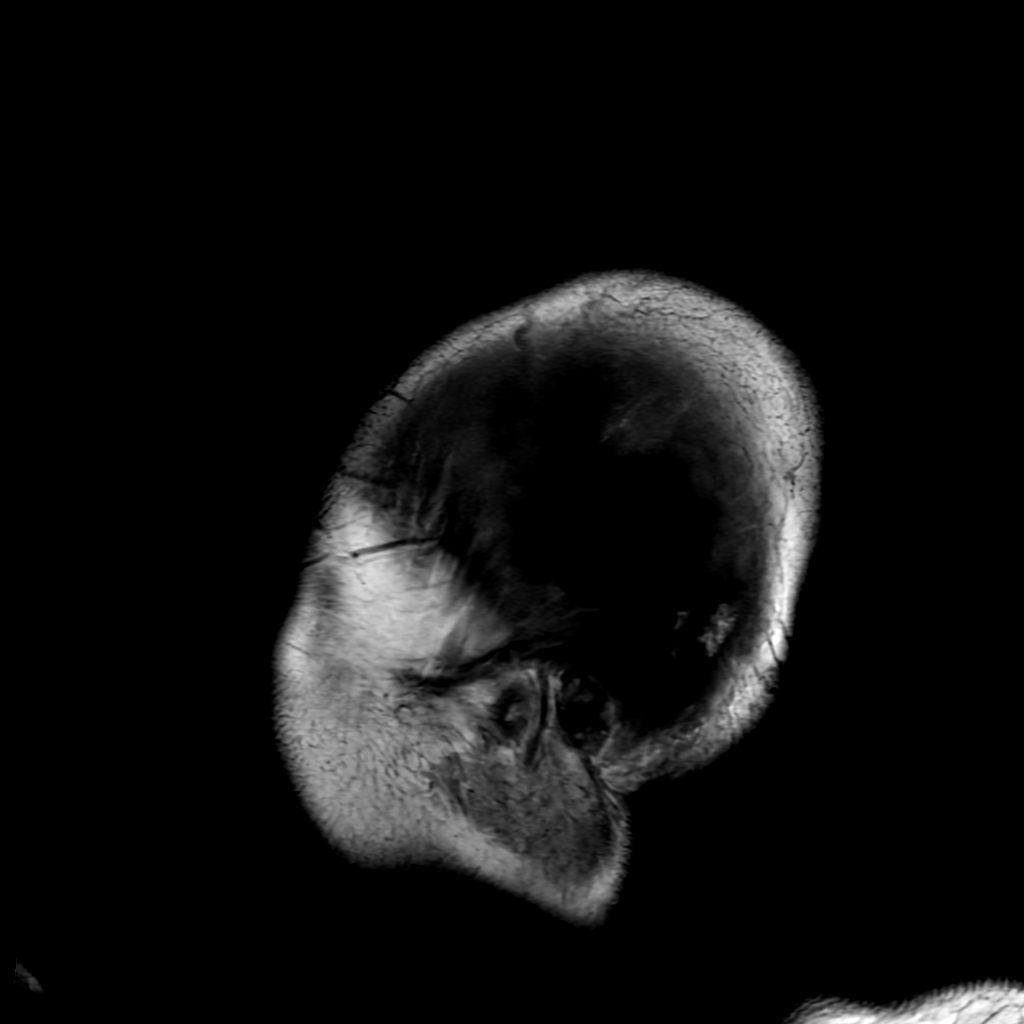

[Series 5: T2 · axial · 5.0mm · 0.23mm/px · 1 of 25 slices shown]
[im 1/25]
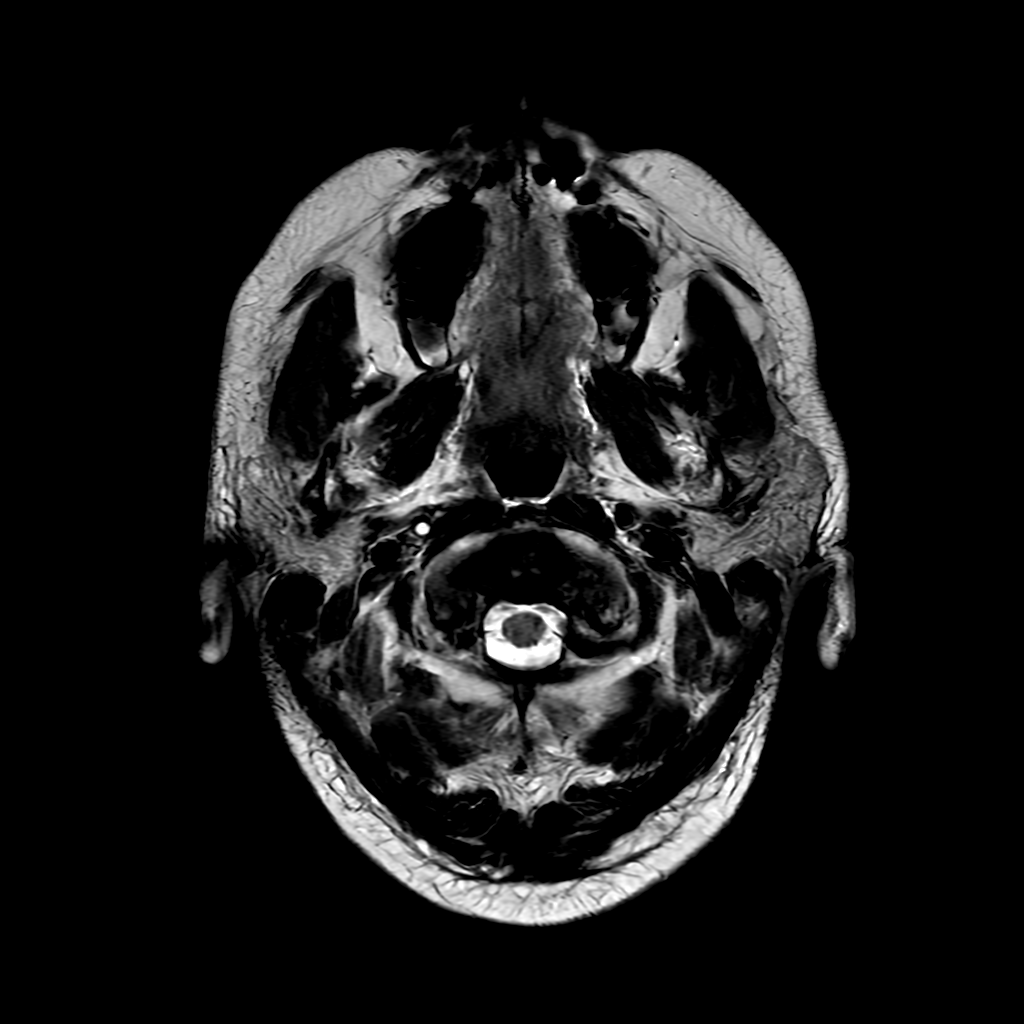

[Series 6: FLAIR · axial · 5.0mm · 0.47mm/px · z∈[-79,+65]mm · 2 of 25 slices shown (2 of 2)]
[im 1/25]
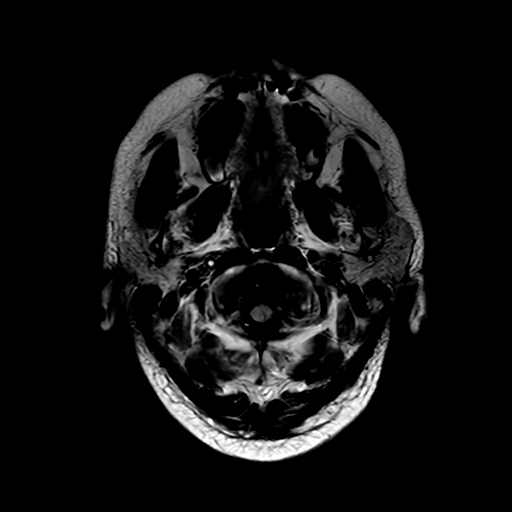
[im 25/25]
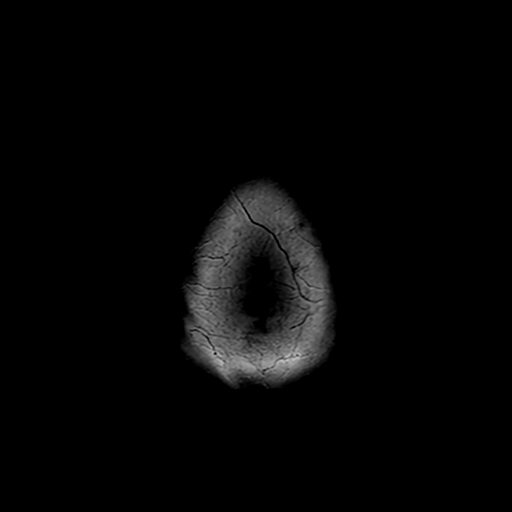

[Series 250: ADC · axial · 3.0mm · 0.94mm/px · z∈[-79,+68]mm · 3 of 50 slices shown (1 of 2)]
[im 1/50]
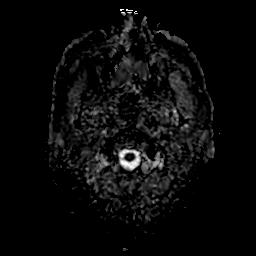
[im 25/50]
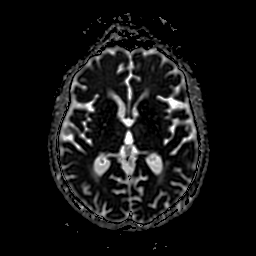
[im 50/50]
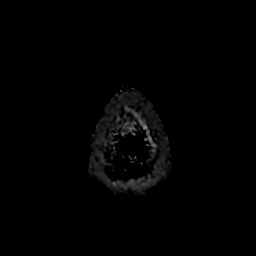

[Series 350: ADC · coronal · 4.0mm · 0.94mm/px · 3 of 37 slices shown (2 of 2)]
[im 1/37]
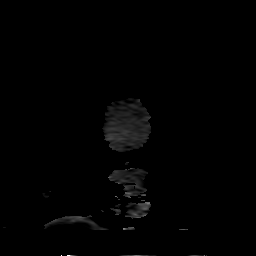
[im 19/37]
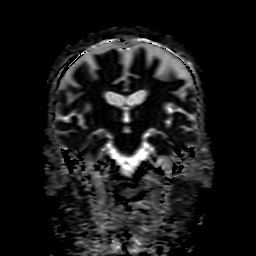
[im 37/37]
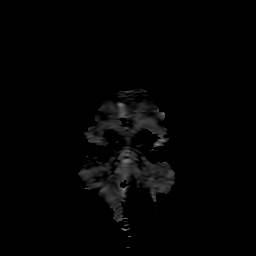

[21 of 48 positions shown; findings below may reference images not displayed]

FINDINGS: Brain: Diffuse prominence of the CSF containing spaces compatible
with generalized age-related cerebral atrophy. Patchy T2/FLAIR
hyperintensity within the periventricular white matter most
consistent with chronic small vessel ischemic disease, mild in
nature. Multifocal areas of encephalomalacia gliosis involving the
posterior left frontoparietal region and right parietal lobe
consistent with chronic ischemic infarcts. Additional tiny remote
right occipital cortical infarct noted.

No abnormal foci of restricted diffusion to suggest acute or
subacute ischemia. Gray-white matter differentiation maintained. No
acute intracranial hemorrhage. Innumerable subcentimeter foci of
susceptibility artifacts seen scattered throughout the
supratentorial and infratentorial brain, consistent with chronic
micro hemorrhages, most likely hypertensive in nature, although
cerebral amyloid angiopathy could also be considered.

No mass lesion, midline shift or mass effect. No hydrocephalus. No
extra-axial fluid collection. Pituitary gland suprasellar region
within normal limits. Midline structures intact. No abnormal
enhancement.

Vascular: Abnormal flow void within the right ICA to the level of
the terminus, likely occluded. Distal reconstitution at the terminus
with flow related signal seen within the right MCA distribution.
Major intracranial vascular flow voids otherwise maintained.

Skull and upper cervical spine: Craniocervical junction within
normal limits. Upper cervical spine normal. Bone marrow signal
intensity within normal limits. No scalp soft tissue abnormality.

Sinuses/Orbits: Patient status post bilateral ocular lens
replacement. Globes and orbital soft tissues demonstrate no acute
finding. Paranasal sinuses are largely clear. No mastoid effusion.

Other: None.
IMPRESSION: 1. No acute intracranial abnormality.
2. Multifocal chronic ischemic infarcts involving the posterior left
frontoparietal region as well as the right parietal and occipital
lobes.
3. Abnormal flow void within the right ICA to the level of the
terminus, likely occluded.
4. Mild age-related atrophy with chronic small vessel ischemic
disease.

## 2021-01-16 ENCOUNTER — Other Ambulatory Visit (INDEPENDENT_AMBULATORY_CARE_PROVIDER_SITE_OTHER): Payer: Medicare Other

## 2021-01-16 ENCOUNTER — Other Ambulatory Visit: Payer: Self-pay

## 2021-01-16 DIAGNOSIS — E1065 Type 1 diabetes mellitus with hyperglycemia: Secondary | ICD-10-CM

## 2021-01-16 LAB — BASIC METABOLIC PANEL
BUN: 29 mg/dL — ABNORMAL HIGH (ref 6–23)
CO2: 31 mEq/L (ref 19–32)
Calcium: 9.8 mg/dL (ref 8.4–10.5)
Chloride: 102 mEq/L (ref 96–112)
Creatinine, Ser: 1.07 mg/dL (ref 0.40–1.20)
GFR: 49.64 mL/min — ABNORMAL LOW (ref >60.00)
Glucose, Bld: 142 mg/dL — ABNORMAL HIGH (ref 70–99)
Potassium: 4.7 mEq/L (ref 3.5–5.1)
Sodium: 140 mEq/L (ref 135–145)

## 2021-01-16 LAB — HEMOGLOBIN A1C: Hgb A1c MFr Bld: 8.6 % — ABNORMAL HIGH (ref 4.6–6.5)

## 2021-01-23 ENCOUNTER — Encounter: Payer: Self-pay | Admitting: Podiatry

## 2021-01-23 ENCOUNTER — Ambulatory Visit (INDEPENDENT_AMBULATORY_CARE_PROVIDER_SITE_OTHER): Payer: Medicare Other | Admitting: Podiatry

## 2021-01-23 ENCOUNTER — Ambulatory Visit (INDEPENDENT_AMBULATORY_CARE_PROVIDER_SITE_OTHER): Payer: Medicare Other | Admitting: Endocrinology

## 2021-01-23 ENCOUNTER — Other Ambulatory Visit: Payer: Self-pay

## 2021-01-23 VITALS — BP 122/58 | HR 72 | Ht 64.0 in | Wt 162.4 lb

## 2021-01-23 DIAGNOSIS — E7800 Pure hypercholesterolemia, unspecified: Secondary | ICD-10-CM

## 2021-01-23 DIAGNOSIS — I10 Essential (primary) hypertension: Secondary | ICD-10-CM | POA: Diagnosis not present

## 2021-01-23 DIAGNOSIS — L89611 Pressure ulcer of right heel, stage 1: Secondary | ICD-10-CM | POA: Diagnosis not present

## 2021-01-23 DIAGNOSIS — E1142 Type 2 diabetes mellitus with diabetic polyneuropathy: Secondary | ICD-10-CM

## 2021-01-23 DIAGNOSIS — R7989 Other specified abnormal findings of blood chemistry: Secondary | ICD-10-CM | POA: Diagnosis not present

## 2021-01-23 DIAGNOSIS — E11621 Type 2 diabetes mellitus with foot ulcer: Secondary | ICD-10-CM | POA: Diagnosis not present

## 2021-01-23 DIAGNOSIS — E1042 Type 1 diabetes mellitus with diabetic polyneuropathy: Secondary | ICD-10-CM | POA: Diagnosis not present

## 2021-01-23 DIAGNOSIS — I6521 Occlusion and stenosis of right carotid artery: Secondary | ICD-10-CM

## 2021-01-23 DIAGNOSIS — L97521 Non-pressure chronic ulcer of other part of left foot limited to breakdown of skin: Secondary | ICD-10-CM | POA: Diagnosis not present

## 2021-01-23 DIAGNOSIS — L84 Corns and callosities: Secondary | ICD-10-CM

## 2021-01-23 DIAGNOSIS — B351 Tinea unguium: Secondary | ICD-10-CM

## 2021-01-23 DIAGNOSIS — E1065 Type 1 diabetes mellitus with hyperglycemia: Secondary | ICD-10-CM | POA: Diagnosis not present

## 2021-01-23 MED ORDER — OMNIPOD 5 DEXG7G6 PODS GEN 5 MISC: 1.0000 | 3 refills | Status: DC

## 2021-01-23 MED ORDER — OMNIPOD 5 DEXG7G6 INTRO GEN 5 KIT: 1.0000 | PACK | Freq: Once | 0 refills | Status: AC

## 2021-01-23 NOTE — Progress Notes (Signed)
Patient ID: Natasha Hernandez, female   DOB: [birth year], 80 y.o.   MRN: [id number]            Reason for Appointment : Follow-up for Type 1 Diabetes    History of Present Illness          Diagnosis: Type 1 diabetes mellitus, date of diagnosis: 1953          Previous history:  She thinks she has been on various insulin regimens over the years but has been on Lantus for 10-12 years along with mealtime insulin Previously followed by an endocrinologist but no records available. Her A1c has usually been around 7% reportedly and was 7.2 in 7/16   Recent history:    OMNIPOD INSULIN PUMP settings   BASAL rate midnight = 0.9, 3 AM = 0.6.  10 AM = 0.55, #1 PM = 0.70.  6 PM = 0.65 and 9 PM = 0.8 with total basal 15 units  Carbohydrate coverage 1: 10 breakfast and subsequently 1: 6 , sensitivity 1: 35 and active insulin 4 hours. Blood sugar target 135 and after 10 PM = 150 Correction threshold 160 at midnight and 175 after 10 p.m.  Current management, blood sugar patterns and problems identified:    Her A1c stays over 8% and again 8.6  She still has significant hyperglycemia related to inadequate, late or missed boluses  She is frequently not doing any bolusing for breakfast and will usually not bolus when her blood sugars are normal for fear of low sugars  Also likely not bolusing enough sometimes for her evening meal for fear of hypoglycemia overnight  Recently she has been eating out a lot in the evenings and frequently will have significantly high readings that persist late at night Although her basal rate was increased at midnight her highest blood sugars are still between about midnight-2 AM On the other hand if her blood sugars are high she will skip her meal despite instructions to correct the sugar and eat with a bolus for food and high sugars Some of the late boluses may be only when the blood sugars go up after eating Weight is about the same She does only low intensity  exercise   Mealtimes are: Breakfast: 8.30 AM, lunch 1-2 PM, Dinner: 7.30   CONTINUOUS GLUCOSE MONITORING RECORD INTERPRETATION of her G6 Dexcom download for the last 30 days   Summary of patterns: Blood sugars are markedly increased overnight starting around 10 PM and not generally improving until 4 AM along with some postprandial spikes either after breakfast or dinner  OVERNIGHT blood sugars are fairly consistently high and come down gradually to a variable extent by early morning without any low sugars  POSTPRANDIAL readings after breakfast are generally higher than 180 in the last week but not as much in the previous week  Postprandial readings after dinner in the last week or frequently going up to over 200 and may not come down till after midnight  Premeal blood sugars are mildly increased at all times but generally higher at lunch  No hypoglycemia with only occasional low normal readings at 12 noon or 6 PM  AVERAGE blood sugars are above target range at all times except around 9 AM Highest average blood sugar 248 at midnight and lowest 150 at 7 PM  Results statistics:  CGM use % of time 98  2-week average/GV 203/60  Time in range    39    % was 51  % Time Above  180 61  % Time above 250 17  % Time Below 70 0.1     Previous data:   CGM use                                                                      % of time  98  Average and SD  188/51, previously 196/58  Time in range  49, previously 43       %  % Time Above 180  50, previously 57  % Time above 250  12%, previously 19  % Time Below target  0.1      Factors causing Hypoglycemia: Overestimating mealtime coverage, excessive basal rate  Symptoms of hypoglycemia: Some weakness, confusion at times Treatment of hypoglycemia: Corn syrup or jellybeans/other sweets.  She will get 15 g of carbohydrate  Self-care: The diet that the patient has been following is: Carbohydrate counting  Mealtimes are: Breakfast AT 8.30 AM  Dinne r: 7.30   For breakfast will have toast with cheese or egg, sometimes cereal and egg  Exercise: walking and that time will use her exercise bike        Dietician consultation: Most recent: Years ago .         CDE consultation: 04/2018   Wt Readings from Last 3 Encounters:  01/23/21 162 lb 6.4 oz (73.7 kg)  11/21/20 163 lb 6 oz (74.1 kg)  09/12/20 165 lb (74.8 kg)    Lab Results  Component Value Date   HGBA1C 8.6 (H) 01/16/2021   HGBA1C 8.6 (H) 09/05/2020   HGBA1C 8.5 (H) 06/10/2020   Lab Results  Component Value Date   MICROALBUR 0.7 09/05/2020   LDLCALC 43 06/10/2020   CREATININE 1.07 01/16/2021    Lab Results  Component Value Date   MICRALBCREAT 0.7 09/05/2020   MICRALBCREAT 0.9 09/07/2019     Allergies as of 01/23/2021   No Known Allergies      Medication List        Accurate as of January 23, 2021  9:01 PM. If you have any questions, ask your nurse or doctor.          AeroChamber Plus inhaler Use with inhaler   albuterol 108 (90 Base) MCG/ACT inhaler Commonly known as: VENTOLIN HFA Inhale 2 puffs into the lungs every 4 (four) hours as needed for wheezing or shortness of breath. Dispense with aerochamber   aspirin EC 81 MG tablet Take 1 tablet (81 mg total) by mouth daily.   CALCIUM MAGNESIUM PO Take 1 tablet by mouth daily.   carvedilol 6.25 MG tablet Commonly known as: COREG Take 1 tablet by mouth twice daily with meals.   CO Q 10 PO Take 1 capsule by mouth daily.   Dexcom G6 Sensor Misc 1 each by Does not apply route See admin instructions. Use one sensor once every 10 days to monitor blood sugars.   Dexcom G6 Transmitter Misc 1 each by Does not apply route See admin instructions. Use one transmitter once every 90 days to transmit blood sugars.   doxycycline 100 MG capsule Commonly known as: VIBRAMYCIN Take 1 capsule (100 mg total) by mouth 2 (two) times daily.   ezetimibe 10 MG tablet Commonly known as: ZETIA Take 1  tablet by  mouth once daily.   glucose blood test strip Use Contour Next test strips as instructed to check blood sugar 4 times daily. DX:E10.65   HumaLOG 100 UNIT/ML injection Generic drug: insulin lispro INJECT 40 UNITS SUBCUTANEOUSLY VIA OMNIPOD INSULIN PUMP   HumaLOG 100 UNIT/ML injection Generic drug: insulin lispro USE MAX OF 40 UNITS DAILY VIA INSULIN PUMP   insulin aspart 100 UNIT/ML injection Commonly known as: NovoLOG Inject 40 Units into the skin 3 (three) times daily before meals. Use 40 units per day with insulin pump. (replaces Humalog)   isosorbide mononitrate 60 MG 24 hr tablet Commonly known as: IMDUR Take 1/2 tablet by mouth every morning, and, take 1 tablet by mouth every evening.   lisinopril 10 MG tablet Commonly known as: ZESTRIL Take 1 tablet by mouth daily.   multivitamin tablet Take 1 tablet by mouth daily.   mupirocin ointment 2 % Commonly known as: BACTROBAN Apply 1 application topically daily.   Omnipod DASH Pods (Gen 4) Misc APPLY ONE POD EVERY 3 DAYS AS DIRECTED   rosuvastatin 40 MG tablet Commonly known as: CRESTOR Take 1 tablet by mouth once daily.   triamcinolone cream 0.1 % Commonly known as: KENALOG Apply 1 application topically 2 (two) times daily.        Allergies: No Known Allergies  Past Medical History:  Diagnosis Date   Carotid artery occlusion    Coronary artery disease    CABG in 1996. Most recent cardiac catheterization in 2013 showed patent grafts including LIMA to LAD, SVG to OM, SVG to diagonal and SVG to PDA.   Diabetes mellitus without complication (HCC)    Hyperlipidemia    Hypertension    PAD (peripheral artery disease) (HCC)     Past Surgical History:  Procedure Laterality Date   CARDIAC CATHETERIZATION     CORONARY ARTERY BYPASS GRAFT  1996   EYE SURGERY      Family History  Problem Relation Age of Onset   Breast cancer Mother 60   Stroke Mother    Emphysema Father 29   Heart attack Father     Social  History:  reports that she has never smoked. She has never used smokeless tobacco. She reports that she does not drink alcohol. No history on file for drug use.      Review of Systems                  No history of hypothyroidism   with only once increased TSH                                        Lab Results  Component Value Date   TSH 4.90 (H) 09/05/2020   TSH 3.70 10/23/2018   TSH 2.53 05/04/2016    Lipids:  She is on Crestor 40 mg daily and since 7/19 also on Zetia  Has history of CAD  several years ago with coronary bypass LDL is below 70 as follows   Lab Results  Component Value Date   CHOL 131 06/10/2020   HDL 74.40 06/10/2020   LDLCALC 43 06/10/2020   TRIG 69.0 06/10/2020   CHOLHDL 2 06/10/2020    Has had proliferative but stable retinopathy, has been going regularly for exams  Neuropathy with some sensory loss: Has been prescribed diabetic shoes  Has regular foot exams, recently having healing of her toe ulcer  HYPERTENSION:  This is controlled with lisinopril 10 mg daily along with low-dose Coreg   BP Readings from Last 3 Encounters:  01/23/21 (!) 122/58  12/14/20 (!) 153/70  11/21/20 110/68     Lab Results  Component Value Date   CREATININE 1.07 01/16/2021   CREATININE 0.91 09/05/2020   CREATININE 0.88 06/10/2020    She refuses to have an influenza vaccine  LABS:  No visits with results within 1 Week(s) from this visit.  Latest known visit with results is:  Lab on 01/16/2021  Component Date Value Ref Range Status   Sodium 01/16/2021 140  135 - 145 mEq/L Final   Potassium 01/16/2021 4.7  3.5 - 5.1 mEq/L Final   Chloride 01/16/2021 102  96 - 112 mEq/L Final   CO2 01/16/2021 31  19 - 32 mEq/L Final   Glucose, Bld 01/16/2021 142 (A) 70 - 99 mg/dL Final   BUN 36/64/4034 29 (A) 6 - 23 mg/dL Final   Creatinine, Ser 01/16/2021 1.07  0.40 - 1.20 mg/dL Final   GFR 74/25/9563 49.64 (A) >60.00 mL/min Final   Calculated using the CKD-EPI  Creatinine Equation (2021)   Calcium 01/16/2021 9.8  8.4 - 10.5 mg/dL Final   Hgb O7F MFr Bld 01/16/2021 8.6 (A) 4.6 - 6.5 % Final   Glycemic Control Guidelines for People with Diabetes:Non Diabetic:  <6%Goal of Therapy: <7%Additional Action Suggested:  >8%      Physical Examination:  BP (!) 122/58   Pulse 72   Ht 5\' 4"  (1.626 m)   Wt 162 lb 6.4 oz (73.7 kg)   SpO2 98%   BMI 27.88 kg/m     ASSESSMENT:  Diabetes type 1, long-standing with retinopathy  A1c is 8.6 again  See history of present illness for analysis of current diabetes management, blood sugar patterns and problems identified  She is on version 4 Omni pod insulin pump with Humalog insulin and using the Dexcom for CGM  She has only 39 % of readings within target compared to 50 % previously  She appears to be needing much more basal delivery all the time except when the morning hours With not bolusing frequently at breakfast time and also inadequate bolusing at dinnertime she has periodic postprandial hyperglycemic spikes also She does appear to be needing much more insulin in the basal late at night and overnight but has been afraid of getting low sugars overnight Also does not bolus appropriately at the start of the meal and will sometimes bolus only when the blood sugar goes up She is still adjusting her diet based on her blood sugar level and not eating when blood sugars are high  Hypertension: Her blood pressure is well controlled  History of high TSH: We will need to repeat  Neuropathy: Followed in foot clinic for recent healing ulcer  PLAN:   Basal rates will be changed as follows 12 AM = 1.05, 2:30 AM = 0.5, 6 AM = 0.6 and 9 PM = 0.9  No change in carbohydrate ratio Explained the need to bolus consistently at all meals regardless of Premeal blood sugar  Discussed what normal blood sugars are  Discussed postprandial target For any higher fat intake at restaurants she will need to bolus additional 2 to  4 units When eating out she may try to bolus right after she finishes her food No skipping meals regardless of blood sugar level No change in bolus settings as yet Will look into the OmniPod 5 pump and prescription will  be sent To call if she is having any unusual hypoglycemia  There are no Patient Instructions on file for this visit.  Total visit time including counseling = 30 minutes    Reather Littler 01/23/2021, 9:01 PM   Note: This note was prepared with Dragon voice recognition system technology. Any transcriptional errors that result from this process are unintentional.

## 2021-01-29 NOTE — Progress Notes (Signed)
Subjective:  Patient ID: Natasha Hernandez, female    DOB: 01-20-1941,  MRN: [id number]  80 y.o. female presents with at risk foot care with history of diabetic neuropathy and callus(es) b/l feet and painful thick toenails that are difficult to trim. Painful toenails interfere with ambulation. Aggravating factors include wearing enclosed shoe gear. Pain is relieved with periodic professional debridement. Painful calluses are aggravated when weightbearing with and without shoegear. Pain is relieved with periodic professional debridement.  Patient's blood sugar was 114 mg/dl today.  She is accompanied by her daughter on today's visit. She enjoyed her granddaughter's wedding.  She did see Urgent Care for a wound that developed on her left 2nd toe. She was placed on 10 day course of Doxycycline 100 mg  twice daily and family has been applying Bactroban to digit daily and states it is improving. Xrays were negative for osteomyelitis.  We have attempted several pairs of shoes to accommodate her deformities. Family has cut out top of her lycra fabricated shoe and this frees her of friction on the left 2nd toe.  Today, Natasha Hernandez c/o painful heel right foot.  PCP: Salley Scarlet, MD and last visit was: patient is unsure. She is also followed by Endocrinologist, Dr. Reather Littler for her diabetes. She has an appointment with him on today.  Review of Systems: Negative except as noted in the HPI.   No Known Allergies  Objective:  There were no vitals filed for this visit. Constitutional Patient is a pleasant 80 y.o. Caucasian female WD, WN in NAD. AAO x 3.  Vascular Capillary refill time to digits immediate b/l. Palpable pedal pulses b/l LE. Pedal hair sparse. Lower extremity skin temperature gradient within normal limits. No cyanosis or clubbing noted.  Neurologic Normal speech. Protective sensation diminished with 10g monofilament b/l. Vibratory sensation intact b/l. Clonus negative b/l.   Dermatologic Pedal skin is thin shiny, atrophic b/l lower extremities. Toenails 1-5 left, R hallux, R 3rd toe, R 4th toe, and R 5th toe elongated, discolored, dystrophic, thickened, and crumbly with subungual debris and tenderness to dorsal palpation. Anonychia noted R 2nd toe. Nailbed(s) epithelialized.  Minimal callus formation submet head 2 left foot, submet head 1 right foot. Left 2nd toe with mild hyperkeratosis. No erythema, no edema, no drainage, no fluctuance. Improved from her photo presentation with Urgent Care. She does have erythema posterior heel right foot. It is tender to palpation. Appears to have sloughed layer of skin. No purulence. No evidence of deep tissue injury.  Orthopedic: Normal muscle strength 5/5 to all lower extremity muscle groups bilaterally. Severe hammertoe deformity noted 1-5 left.   Hemoglobin A1C Latest Ref Rng & Units 01/16/2021 09/05/2020 06/10/2020 02/18/2020  HGBA1C 4.6 - 6.5 % 8.6(H) 8.6(H) 8.5(H) 8.3(H)  Some recent data might be hidden   Assessment:   1. Onychomycosis   2. Callus   3. Diabetic ulcer of toe of left foot associated with type 2 diabetes mellitus, limited to breakdown of skin (HCC)   4. Pressure injury of right heel, stage 1   5. Diabetic peripheral neuropathy associated with type 2 diabetes mellitus (HCC)    Plan:  -Examined patient. -Calluses pared submetatarsal head(s) 2 left foot and submet head 1 right foot utilizing sterile scalpel blade without incident.  -For pressure ulcer right heel, we discussed need for pressure relief. Educated daughter on floating heels. Also recommended heel protectors to be worn in bed daily. They may purchase here at the office or on [employer]. Do not  walk with heel protectors on at any time as this poses a fall risk. Call office if condition worsens. -Left second toe ulceration is slowly healing.  No signs of infection. Continue daily application of Bactroban to digit. Call office should she encounter any  problems. For now, continue shoe with dorsal cut-out left foot. No debridement performed today. -Toenails 1-5 left, R hallux, R 3rd toe, R 4th toe, and R 5th toe debrided in length and girth without iatrogenic bleeding with sterile nail nipper and dremel.  -Patient to report any pedal injuries to medical professional immediately. -Daughter to call office should there be no improvement with current treatment plan.  Return in about 3 months (around 04/25/2021).  Freddie Breech, DPM

## 2021-01-30 DIAGNOSIS — E113553 Type 2 diabetes mellitus with stable proliferative diabetic retinopathy, bilateral: Secondary | ICD-10-CM | POA: Diagnosis not present

## 2021-01-30 DIAGNOSIS — Z961 Presence of intraocular lens: Secondary | ICD-10-CM | POA: Diagnosis not present

## 2021-01-30 DIAGNOSIS — Z794 Long term (current) use of insulin: Secondary | ICD-10-CM | POA: Diagnosis not present

## 2021-01-30 DIAGNOSIS — H35373 Puckering of macula, bilateral: Secondary | ICD-10-CM | POA: Diagnosis not present

## 2021-01-30 DIAGNOSIS — H18423 Band keratopathy, bilateral: Secondary | ICD-10-CM | POA: Diagnosis not present

## 2021-02-12 ENCOUNTER — Other Ambulatory Visit: Payer: Self-pay | Admitting: Endocrinology

## 2021-02-16 ENCOUNTER — Encounter (INDEPENDENT_AMBULATORY_CARE_PROVIDER_SITE_OTHER): Payer: Medicare Other | Admitting: Ophthalmology

## 2021-03-06 ENCOUNTER — Telehealth: Payer: Self-pay | Admitting: Endocrinology

## 2021-03-06 ENCOUNTER — Encounter: Payer: Self-pay | Admitting: Endocrinology

## 2021-03-06 NOTE — Telephone Encounter (Signed)
She wants to switch to the OmniPod 5 which she has received but likely needs supervision to do the set up.  Her blood sugar target should be 130.  Her daughter is more knowledgeable about programming.  Please give her a call

## 2021-03-10 NOTE — Telephone Encounter (Signed)
Daugher reported that she feels that she can transfer the settings to the new OP5 PDM as well as link her to this practice.  She was told to call me if she has any questions, or to call the help line with this.

## 2021-03-15 ENCOUNTER — Other Ambulatory Visit: Payer: Self-pay | Admitting: Cardiovascular Disease

## 2021-03-15 ENCOUNTER — Other Ambulatory Visit: Payer: Self-pay | Admitting: Endocrinology

## 2021-03-15 DIAGNOSIS — E7800 Pure hypercholesterolemia, unspecified: Secondary | ICD-10-CM

## 2021-04-08 ENCOUNTER — Encounter: Payer: Self-pay | Admitting: Endocrinology

## 2021-04-16 NOTE — Telephone Encounter (Signed)
Patient pickup Dexcom G6.

## 2021-04-23 ENCOUNTER — Encounter (INDEPENDENT_AMBULATORY_CARE_PROVIDER_SITE_OTHER): Payer: Medicare Other | Admitting: Ophthalmology

## 2021-04-24 ENCOUNTER — Other Ambulatory Visit: Payer: Medicare Other

## 2021-05-01 ENCOUNTER — Other Ambulatory Visit (INDEPENDENT_AMBULATORY_CARE_PROVIDER_SITE_OTHER): Payer: Medicare Other

## 2021-05-01 ENCOUNTER — Other Ambulatory Visit: Payer: Self-pay

## 2021-05-01 ENCOUNTER — Encounter: Payer: Self-pay | Admitting: Podiatry

## 2021-05-01 ENCOUNTER — Ambulatory Visit (INDEPENDENT_AMBULATORY_CARE_PROVIDER_SITE_OTHER): Payer: Medicare Other | Admitting: Podiatry

## 2021-05-01 ENCOUNTER — Ambulatory Visit: Payer: Medicare Other | Admitting: Endocrinology

## 2021-05-01 DIAGNOSIS — M2041 Other hammer toe(s) (acquired), right foot: Secondary | ICD-10-CM | POA: Diagnosis not present

## 2021-05-01 DIAGNOSIS — M2042 Other hammer toe(s) (acquired), left foot: Secondary | ICD-10-CM | POA: Diagnosis not present

## 2021-05-01 DIAGNOSIS — R234 Changes in skin texture: Secondary | ICD-10-CM

## 2021-05-01 DIAGNOSIS — B351 Tinea unguium: Secondary | ICD-10-CM

## 2021-05-01 DIAGNOSIS — E1142 Type 2 diabetes mellitus with diabetic polyneuropathy: Secondary | ICD-10-CM | POA: Diagnosis not present

## 2021-05-01 DIAGNOSIS — E1065 Type 1 diabetes mellitus with hyperglycemia: Secondary | ICD-10-CM

## 2021-05-01 LAB — COMPREHENSIVE METABOLIC PANEL
ALT: 25 U/L (ref 0–35)
AST: 23 U/L (ref 0–37)
Albumin: 4.1 g/dL (ref 3.5–5.2)
Alkaline Phosphatase: 84 U/L (ref 39–117)
BUN: 23 mg/dL (ref 6–23)
CO2: 33 mEq/L — ABNORMAL HIGH (ref 19–32)
Calcium: 9.4 mg/dL (ref 8.4–10.5)
Chloride: 101 mEq/L (ref 96–112)
Creatinine, Ser: 0.82 mg/dL (ref 0.40–1.20)
GFR: 68.18 mL/min (ref >60.00)
Glucose, Bld: 166 mg/dL — ABNORMAL HIGH (ref 70–99)
Potassium: 4.5 mEq/L (ref 3.5–5.1)
Sodium: 141 mEq/L (ref 135–145)
Total Bilirubin: 0.6 mg/dL (ref 0.2–1.2)
Total Protein: 6 g/dL (ref 6.0–8.3)

## 2021-05-01 LAB — LIPID PANEL
Cholesterol: 137 mg/dL (ref 0–200)
HDL: 79.5 mg/dL (ref >39.00)
LDL Cholesterol: 45 mg/dL (ref 0–99)
NonHDL: 57.88
Total CHOL/HDL Ratio: 2
Triglycerides: 62 mg/dL (ref 0.0–149.0)
VLDL: 12.4 mg/dL (ref 0.0–40.0)

## 2021-05-01 LAB — HEMOGLOBIN A1C: Hgb A1c MFr Bld: 9 % — ABNORMAL HIGH (ref 4.6–6.5)

## 2021-05-01 LAB — TSH: TSH: 4.9 u[IU]/mL (ref 0.35–5.50)

## 2021-05-01 LAB — T4, FREE: Free T4: 0.68 ng/dL (ref 0.60–1.60)

## 2021-05-01 NOTE — Progress Notes (Signed)
Subjective: Natasha Hernandez is a 81 y.o. female patient seen today for follow up for at risk foot care. She has h/o ulceration submet head 2 of the left foot. She is also seen for management  of painful thick toenails that are difficult to trim. Pain interferes with ambulation. Aggravating factors include wearing enclosed shoe gear. Pain is relieved with periodic professional debridement.  New problems reported today: Spot on top of left great toe. She has had similar problem of left 2nd hammertoe and family cut out material of shoe to relieve pressure. They do not desire to see Pedorthist today. Daughter states she will get Natasha Hernandez to do the same for the left great toe.  Patient states their blood glucose was 165 mg/dl today.   Her daughter is present during today's visit.  PCP is Natasha Snare, MD. Last visit was: 01/23/2021.  No Known Allergies  Objective: Physical Exam  General: Patient is a pleasant 81 y.o. Caucasian female WD, WN in NAD. AAO x 3.   Neurovascular Examination: CFT immediate b/l LE. Faintly palpable DP/PT pulses b/l LE. Digital hair present b/l. Skin temperature gradient WNL b/l. No pain with calf compression b/l. No edema noted b/l. No cyanosis or clubbing noted b/l LE.  Protective sensation diminished with 10g monofilament b/l. Vibratory sensation intact b/l.  Dermatological:  Pedal skin thin, shiny and atrophic b/l LE. She has a small scab noted dorsal IPJ of left hallux. No erythema, no edema, no drainage, no fluctuance, no ischemia, no gangrene. No open wounds b/l LE. No interdigital macerations noted b/l LE. Toenails 1-5 b/l elongated, discolored, dystrophic, thickened, crumbly with subungual debris and tenderness to dorsal palpation. No hyperkeratotic nor porokeratotic lesions present on today's visit.  Musculoskeletal:  Muscle strength 5/5 to all lower extremity muscle groups bilaterally. Severe hammertoe deformity noted 1-5 bilaterally. Patient ambulates  independent of any assistive aids.  Assessment: 1. Onychomycosis   2. Scab   3. Acquired hammertoes of both feet   4. Diabetic peripheral neuropathy associated with type 2 diabetes mellitus ([place])    Plan: Patient was evaluated and treated and all questions answered. Consent given for treatment as described below: -Examined patient. -New finding of scab noted dorsal hallux IPJ of left foot. No signs of infection. Covered with fabric band-aid today. Family will cut out material on top of shoe to relieve pressure as this has worked for her left 2nd toe. -Mycotic toenails 1-5 bilaterally were debrided in length and girth with sterile nail nippers and dremel without incident. -Patient to report any pedal injuries to medical professional immediately. -Patient/POA to call should there be question/concern in the interim.  Return in about 3 months (around 07/30/2021).  Marzetta Board, DPM

## 2021-05-08 ENCOUNTER — Other Ambulatory Visit: Payer: Self-pay

## 2021-05-08 ENCOUNTER — Encounter: Payer: Self-pay | Admitting: Endocrinology

## 2021-05-08 ENCOUNTER — Ambulatory Visit (INDEPENDENT_AMBULATORY_CARE_PROVIDER_SITE_OTHER): Payer: Medicare Other | Admitting: Endocrinology

## 2021-05-08 VITALS — BP 130/56 | HR 74 | Ht 64.0 in | Wt 168.6 lb

## 2021-05-08 DIAGNOSIS — E7800 Pure hypercholesterolemia, unspecified: Secondary | ICD-10-CM

## 2021-05-08 DIAGNOSIS — I10 Essential (primary) hypertension: Secondary | ICD-10-CM | POA: Diagnosis not present

## 2021-05-08 DIAGNOSIS — E1065 Type 1 diabetes mellitus with hyperglycemia: Secondary | ICD-10-CM

## 2021-05-08 NOTE — Progress Notes (Signed)
Patient ID: Natasha Hernandez, female   DOB: 08/05/1940, 81 y.o.   MRN: [id number]            Reason for Appointment : Follow-up for Type 1 Diabetes    History of Present Illness          Diagnosis: Type 1 diabetes mellitus, date of diagnosis: 1953          Previous history:  Natasha Hernandez thinks Natasha Hernandez has been on various insulin regimens over the years but has been on Lantus for 10-12 years along with mealtime insulin Previously followed by an endocrinologist but no records available. Her A1c has usually been around 7% reportedly and was 7.2 in 7/16   Recent history:    OMNIPOD INSULIN PUMP settings   BASAL rate midnight = 0.9, 3 AM = 0.6.  10 AM = 0.55, #1 PM = 0.70.  6 PM = 0.65 and 9 PM = 0.8 with total basal 15 units  Carbohydrate coverage 1: 10 breakfast and subsequently 1: 6 , sensitivity 1: 35 and active insulin 4 hours. Blood sugar target 135 and after 10 PM = 150 Correction threshold 160 at midnight and 175 after 10 p.m.  Current management, blood sugar patterns and problems identified:    Her A1c stays over 8% and a now 9%  Natasha Hernandez has been on the OmniPod 5 pump since about November  However her pump is not linked to our account and difficult to know what her insulin activity is Surprisingly also despite using the automated mode her blood sugars are not improving and on an average not coming down below 160 at any given time Overall however her time in range is slightly better than the last visit and Natasha Hernandez has less postprandial spikes Unable to explain her blood sugar rising late at night but after some discussion the patient admitted that Natasha Hernandez is drinking juice at bedtime because of fear of hypoglycemia Natasha Hernandez is still not understanding that the automated mode is able to prevent hypoglycemia even overnight Also at mealtime Natasha Hernandez may not bolus unless her blood sugar is slightly high and may allow the blood sugar to rise before the bolus Weight is about the same Natasha Hernandez does only low  intensity exercise   Mealtimes are: Breakfast: 8.30 AM, lunch 1-2 PM, Dinner: 7.30   CONTINUOUS GLUCOSE MONITORING RECORD INTERPRETATION of her G6 Dexcom download for the last 30 days   Summary of patterns: Blood sugars are overall averaging between 163 and 222 on an hourly basis with highest readings generally between midnight-4 AM and lowest readings in the early mornings  Overall generally blood sugars are relatively stable with some sporadic fluctuation especially late evenings and overnight  Generally does not appear to have any hypoglycemia with only rare low normal readings at 4 AM and 3 PM  POSTPRANDIAL readings are not clearly rising significantly except occasionally after evening meal  However blood sugars are generally trending higher late in the evening after 9 PM and not improving until at least 3 AM  OVERNIGHT blood sugars are fairly consistently high averaging over 200 between 11 PM and 3:30 AM and then gradually decreasing with variable velocity  Results statistics:  CGM use % of time 93  2-week average/GV 193  Time in range        46%  % Time Above 180 40  % Time above 250 14  % Time Below 70 0        Previously:   CGM use % of  time 98  2-week average/GV 203/60  Time in range    39    % was 51  % Time Above 180 61  % Time above 250 17  % Time Below 70 0.1     Factors causing Hypoglycemia: Overestimating mealtime coverage, excessive basal rate  Symptoms of hypoglycemia: Some weakness, confusion at times Treatment of hypoglycemia: Corn syrup or jellybeans/other sweets.  Natasha Hernandez will get 15 g of carbohydrate  Self-care: The diet that the patient has been following is: Carbohydrate counting  Mealtimes are: Breakfast AT 8.30 AM Dinne r: 7.30   For breakfast will have toast with cheese or egg, sometimes cereal and egg  Exercise: walking and that time will use her exercise bike        Dietician consultation: Most recent: Years ago .         CDE consultation:  04/2018   Wt Readings from Last 3 Encounters:  05/08/21 168 lb 9.6 oz (76.5 kg)  01/23/21 162 lb 6.4 oz (73.7 kg)  11/21/20 163 lb 6 oz (74.1 kg)    Lab Results  Component Value Date   HGBA1C 9.0 (H) 05/01/2021   HGBA1C 8.6 (H) 01/16/2021   HGBA1C 8.6 (H) 09/05/2020   Lab Results  Component Value Date   MICROALBUR 0.7 09/05/2020   LDLCALC 45 05/01/2021   CREATININE 0.82 05/01/2021    Lab Results  Component Value Date   MICRALBCREAT 0.7 09/05/2020   MICRALBCREAT 0.9 09/07/2019     Allergies as of 05/08/2021   No Known Allergies      Medication List        Accurate as of May 08, 2021  9:52 AM. If you have any questions, ask your nurse or doctor.          AeroChamber Plus inhaler Use with inhaler   albuterol 108 (90 Base) MCG/ACT inhaler Commonly known as: VENTOLIN HFA Inhale 2 puffs into the lungs every 4 (four) hours as needed for wheezing or shortness of breath. Dispense with aerochamber   aspirin EC 81 MG tablet Take 1 tablet (81 mg total) by mouth daily.   CALCIUM MAGNESIUM PO Take 1 tablet by mouth daily.   carvedilol 6.25 MG tablet Commonly known as: COREG Take 1 tablet by mouth twice daily with meals.   CO Q 10 PO Take 1 capsule by mouth daily.   Dexcom G6 Sensor Misc 1 each by Does not apply route See admin instructions. Use one sensor once every 10 days to monitor blood sugars.   Dexcom G6 Transmitter Misc 1 each by Does not apply route See admin instructions. Use one transmitter once every 90 days to transmit blood sugars.   doxycycline 100 MG capsule Commonly known as: VIBRAMYCIN Take 1 capsule (100 mg total) by mouth 2 (two) times daily.   ezetimibe 10 MG tablet Commonly known as: ZETIA Take 1 tablet by mouth once daily.   glucose blood test strip Use Contour Next test strips as instructed to check blood sugar 4 times daily. DX:E10.65   HumaLOG 100 UNIT/ML injection Generic drug: insulin lispro INJECT 40 UNITS  SUBCUTANEOUSLY VIA OMNIPOD INSULIN PUMP   HumaLOG 100 UNIT/ML injection Generic drug: insulin lispro USE MAX OF 40 UNITS DAILY VIA INSULIN PUMP   insulin aspart 100 UNIT/ML injection Commonly known as: NovoLOG Inject 40 Units into the skin 3 (three) times daily before meals. Use 40 units per day with insulin pump. (replaces Humalog)   isosorbide mononitrate 60 MG 24 hr tablet Commonly  known as: IMDUR Take 1/2 tablet by mouth every morning, and, take 1 tablet by mouth every evening.   lisinopril 10 MG tablet Commonly known as: ZESTRIL Take 1 tablet by mouth daily.   multivitamin tablet Take 1 tablet by mouth daily.   mupirocin ointment 2 % Commonly known as: BACTROBAN Apply 1 application topically daily.   Omnipod 5 G6 Intro (Gen 5) Kit See admin instructions.   rosuvastatin 40 MG tablet Commonly known as: CRESTOR Take 1 tablet by mouth once daily.   triamcinolone cream 0.1 % Commonly known as: KENALOG Apply 1 application topically 2 (two) times daily.        Allergies: No Known Allergies  Past Medical History:  Diagnosis Date   Carotid artery occlusion    Coronary artery disease    CABG in 1996. Most recent cardiac catheterization in 2013 showed patent grafts including LIMA to LAD, SVG to OM, SVG to diagonal and SVG to PDA.   Diabetes mellitus without complication ([place])    Hyperlipidemia    Hypertension    PAD (peripheral artery disease) (HCC)     Past Surgical History:  Procedure Laterality Date   CARDIAC CATHETERIZATION     CORONARY ARTERY BYPASS GRAFT  1996   EYE SURGERY      Family History  Problem Relation Age of Onset   Breast cancer Mother 65   Stroke Mother    Emphysema Father 80   Heart attack Father     Social History:  reports that Natasha Hernandez has never smoked. Natasha Hernandez has never used smokeless tobacco. Natasha Hernandez reports that Natasha Hernandez does not drink alcohol. No history on file for drug use.      Review of Systems                  No history of  hypothyroidism   with only once increased TSH                                        Lab Results  Component Value Date   TSH 4.90 05/01/2021   TSH 4.90 (H) 09/05/2020   TSH 3.70 10/23/2018   FREET4 0.68 05/01/2021    Lipids:  Natasha Hernandez is on Crestor 40 mg daily and since 7/19 also on Zetia  Has history of CAD  several years ago with coronary bypass LDL is below 70 as follows   Lab Results  Component Value Date   CHOL 137 05/01/2021   HDL 79.50 05/01/2021   LDLCALC 45 05/01/2021   TRIG 62.0 05/01/2021   CHOLHDL 2 05/01/2021    Has had proliferative but stable retinopathy, has been going regularly for exams  Neuropathy with some sensory loss: Has been prescribed diabetic shoes  Has regular foot exams, followed by podiatrist  HYPERTENSION:  This is controlled with lisinopril 10 mg daily along with low-dose Coreg   BP Readings from Last 3 Encounters:  05/08/21 (!) 130/56  01/23/21 (!) 122/58  12/14/20 (!) 153/70     Lab Results  Component Value Date   CREATININE 0.82 05/01/2021   CREATININE 1.07 01/16/2021   CREATININE 0.91 09/05/2020    Natasha Hernandez refuses to have an influenza vaccine  LABS:  No visits with results within 1 Week(s) from this visit.  Latest known visit with results is:  Lab on 05/01/2021  Component Date Value Ref Range Status   Cholesterol 05/01/2021 137  0 - 200  mg/dL Final   ATP III Classification       Desirable:  < 200 mg/dL               Borderline High:  200 - 239 mg/dL          High:  > = 240 mg/dL   Triglycerides 05/01/2021 62.0  0.0 - 149.0 mg/dL Final   Normal:  <150 mg/dLBorderline High:  150 - 199 mg/dL   HDL 05/01/2021 79.50  >39.00 mg/dL Final   VLDL 05/01/2021 12.4  0.0 - 40.0 mg/dL Final   LDL Cholesterol 05/01/2021 45  0 - 99 mg/dL Final   Total CHOL/HDL Ratio 05/01/2021 2   Final                  Men          Women1/2 Average Risk     3.4          3.3Average Risk          5.0          4.42X Average Risk          9.6          7.13X  Average Risk          15.0          11.0                       NonHDL 05/01/2021 57.88   Final   NOTE:  Non-HDL goal should be 30 mg/dL higher than patient's LDL goal (i.e. LDL goal of < 70 mg/dL, would have non-HDL goal of < 100 mg/dL)   Free T4 05/01/2021 0.68  0.60 - 1.60 ng/dL Final   Comment: Specimens from patients who are undergoing biotin therapy and /or ingesting biotin supplements may contain high levels of biotin.  The higher biotin concentration in these specimens interferes with this Free T4 assay.  Specimens that contain high levels  of biotin may cause false high results for this Free T4 assay.  Please interpret results in light of the total clinical presentation of the patient.     TSH 05/01/2021 4.90  0.35 - 5.50 uIU/mL Final   Sodium 05/01/2021 141  135 - 145 mEq/L Final   Potassium 05/01/2021 4.5  3.5 - 5.1 mEq/L Final   Chloride 05/01/2021 101  96 - 112 mEq/L Final   CO2 05/01/2021 33 (H)  19 - 32 mEq/L Final   Glucose, Bld 05/01/2021 166 (H)  70 - 99 mg/dL Final   BUN 05/01/2021 23  6 - 23 mg/dL Final   Creatinine, Ser 05/01/2021 0.82  0.40 - 1.20 mg/dL Final   Total Bilirubin 05/01/2021 0.6  0.2 - 1.2 mg/dL Final   Alkaline Phosphatase 05/01/2021 84  39 - 117 U/L Final   AST 05/01/2021 23  0 - 37 U/L Final   ALT 05/01/2021 25  0 - 35 U/L Final   Total Protein 05/01/2021 6.0  6.0 - 8.3 g/dL Final   Albumin 05/01/2021 4.1  3.5 - 5.2 g/dL Final   GFR 05/01/2021 68.18  >60.00 mL/min Final   Calculated using the CKD-EPI Creatinine Equation (2021)   Calcium 05/01/2021 9.4  8.4 - 10.5 mg/dL Final   Hgb A1c MFr Bld 05/01/2021 9.0 (H)  4.6 - 6.5 % Final   Glycemic Control Guidelines for People with Diabetes:Non Diabetic:  <6%Goal of Therapy: <7%Additional Action Suggested:  >8%  Physical Examination:  BP (!) 130/56    Pulse 74    Ht 5' 4"  (1.626 m)    Wt 168 lb 9.6 oz (76.5 kg)    SpO2 96%    BMI 28.94 kg/m     ASSESSMENT:  Diabetes type 1, long-standing with  retinopathy  A1c is 9%  See history of present illness for analysis of current diabetes management, blood sugar patterns and problems identified  Natasha Hernandez is on version 5 Omni pod insulin pump with Humalog insulin and using the Dexcom for CGM  Natasha Hernandez has only 46 % of readings within target  This is unexpected since Natasha Hernandez is supposed to be on the automated mode as verified on her PDM today Since Natasha Hernandez is not linked with her pump to our online account unable to view her insulin activity at all Blood sugar data reviewed by download of her CGM information  Natasha Hernandez is likely intentionally keeping her blood sugars higher than target because of fear of hypoglycemia and still does not understand that the automated mode will prevent hypoglycemia Natasha Hernandez is drinking excessive juices late at night causing her blood sugar to be averaging about 210+ late at night Natasha Hernandez thinks Natasha Hernandez is still getting hypoglycemia but showed her that this is not occurring on her CGM download, also has 0% readings below 70  Hypertension: Her blood pressure is well controlled  History of high TSH: Back to normal  LIPIDS: Well-controlled  Neuropathy: Followed in foot clinic   PLAN:   Natasha Hernandez will stop drinking juice at bedtime Explained to her that raising her blood sugars is increasing the pump insulin delivery which may later on because her sugars to come down to low normal levels Natasha Hernandez was reassured that the pump will prevent hypoglycemia overnight Her daughter will try to link the pump to our website for the OmniPod and will follow-up in another month or so Will need to reevaluate her blood sugar targets once the following data is available  There are no Patient Instructions on file for this visit.  Total visit time including counseling = 30 minutes    Elayne Snare 05/08/2021, 9:52 AM   Note: This note was prepared with Dragon voice recognition system technology. Any transcriptional errors that result from this process are  unintentional.

## 2021-05-10 ENCOUNTER — Other Ambulatory Visit: Payer: Self-pay | Admitting: Endocrinology

## 2021-05-12 ENCOUNTER — Encounter: Payer: Self-pay | Admitting: Endocrinology

## 2021-05-13 ENCOUNTER — Telehealth: Payer: Self-pay | Admitting: Endocrinology

## 2021-05-13 DIAGNOSIS — E1065 Type 1 diabetes mellitus with hyperglycemia: Secondary | ICD-10-CM

## 2021-05-13 MED ORDER — OMNIPOD 5 DEXG7G6 PODS GEN 5 MISC: 1.0000 | 2 refills | Status: DC

## 2021-05-13 NOTE — Telephone Encounter (Signed)
Pt notified that Rx sent. No samples

## 2021-05-13 NOTE — Telephone Encounter (Signed)
Rx sent. Paitent daughter notified

## 2021-05-13 NOTE — Telephone Encounter (Signed)
Pts daughter is calling in stating that the pt is out of Rx insulin disposable (OMNIPOD 5 G6) she only needing the pods.  Pharm:  [facility] on [address].

## 2021-05-14 ENCOUNTER — Encounter: Payer: Self-pay | Admitting: Endocrinology

## 2021-05-22 ENCOUNTER — Other Ambulatory Visit: Payer: Self-pay

## 2021-05-22 ENCOUNTER — Encounter: Payer: Self-pay | Admitting: Cardiovascular Disease

## 2021-05-22 ENCOUNTER — Ambulatory Visit (INDEPENDENT_AMBULATORY_CARE_PROVIDER_SITE_OTHER): Payer: Medicare Other | Admitting: Cardiovascular Disease

## 2021-05-22 VITALS — BP 140/70 | HR 76 | Ht 64.0 in | Wt 166.5 lb

## 2021-05-22 DIAGNOSIS — I739 Peripheral vascular disease, unspecified: Secondary | ICD-10-CM

## 2021-05-22 DIAGNOSIS — E785 Hyperlipidemia, unspecified: Secondary | ICD-10-CM | POA: Diagnosis not present

## 2021-05-22 DIAGNOSIS — I6523 Occlusion and stenosis of bilateral carotid arteries: Secondary | ICD-10-CM

## 2021-05-22 DIAGNOSIS — I2510 Atherosclerotic heart disease of native coronary artery without angina pectoris: Secondary | ICD-10-CM | POA: Diagnosis not present

## 2021-05-22 DIAGNOSIS — I10 Essential (primary) hypertension: Secondary | ICD-10-CM | POA: Diagnosis not present

## 2021-05-22 NOTE — Patient Instructions (Signed)
Medication Instructions:  Your physician recommends that you continue on your current medications as directed. Please refer to the Current Medication list given to you today.  *If you need a refill on your cardiac medications before your next appointment, please call your pharmacy*   Lab Work: None ordered If you have labs (blood work) drawn today and your tests are completely normal, you will receive your results only by: MyChart Message (if you have MyChart) OR A paper copy in the mail If you have any lab test that is abnormal or we need to change your treatment, we will call you to review the results.   Testing/Procedures: Your physician has requested that you have a carotid duplex. This test is an ultrasound of the carotid arteries in your neck. It looks at blood flow through these arteries that supply the brain with blood. Allow one hour for this exam. There are no restrictions or special instructions. (To be scheduled in March 2023)   Follow-Up: At [facility], you and your health needs are our priority.  As part of our continuing mission to provide you with exceptional heart care, we have created designated Provider Care Teams.  These Care Teams include your primary Cardiologist (physician) and Advanced Practice Providers (APPs -  Physician Assistants and Nurse Practitioners) who all work together to provide you with the care you need, when you need it.  We recommend signing up for the patient portal called "MyChart".  Sign up information is provided on this After Visit Summary.  MyChart is used to connect with patients for Virtual Visits (Telemedicine).  Patients are able to view lab/test results, encounter notes, upcoming appointments, etc.  Non-urgent messages can be sent to your provider as well.   To learn more about what you can do with MyChart, go to [email or web address].    Your next appointment:   Your physician wants you to follow-up in: 1 year You will receive a  reminder letter in the mail two months in advance. If you don't receive a letter, please call our office to schedule the follow-up appointment.   The format for your next appointment:   In Person  Provider:   You may see Lorine Bears, MD or one of the following Advanced Practice Providers on your designated Care Team:   Nicolasa Ducking, NP Eula Listen, PA-C Cadence Fransico Michael, [place]    Other Instructions N/A

## 2021-05-22 NOTE — Progress Notes (Signed)
Cardiology Office Note   Date:  05/22/2021   ID:  Natasha Hernandez, DOB [birth year], MRN [id number]  PCP:  Natasha Snare, MD  Cardiologist:  Natasha Hernandez  Chief Complaint  Patient presents with   Other    6 month f/u no complaints today. Meds reviewed verbally with pt.      History of Present Illness: Natasha Hernandez is a 81 y.o. female who is here today for a follow-up visit regarding coronary artery disease and peripheral arterial disease.  She has extensive medical problems that include coronary artery disease status post CABG in 1996 at [facility], type 1 diabetes since she was 81 years old, hypertension, hyperlipidemia, peripheral arterial disease and carotid artery disease. Most recent cardiac catheterization was done in 2013 which showed patent grafts including SVG to OM, SVG to diagonal, SVG to PDA and LIMA to LAD. She is known to have occluded right carotid artery . She is a lifelong nonsmoker. She is known to have peripheral arterial disease with no significant claudication. Noninvasive vascular evaluation in August 2017 showed an ABI of 0.85 on the right and 1.1 on the left. Duplex showed no obstructive aortoiliac or SFA disease. There was one-vessel runoff below the knee bilaterally.  Most recent [place] in January 2020 showed no evidence of ischemia with normal ejection fraction.  She was most recently seen in August in our office and was doing reasonably well at that time.  She has been doing well with no recent chest pain, shortness of breath or palpitations.  She has cramps in her legs mostly at night that improves after she walks.  Past Medical History:  Diagnosis Date   Carotid artery occlusion    Coronary artery disease    CABG in 1996. Most recent cardiac catheterization in 2013 showed patent grafts including LIMA to LAD, SVG to OM, SVG to diagonal and SVG to PDA.   Diabetes mellitus without complication ([place])    Hyperlipidemia    Hypertension     PAD (peripheral artery disease) (HCC)     Past Surgical History:  Procedure Laterality Date   CARDIAC CATHETERIZATION     CORONARY ARTERY BYPASS GRAFT  1996   EYE SURGERY       Current Outpatient Medications  Medication Sig Dispense Refill   aspirin EC 81 MG tablet Take 1 tablet (81 mg total) by mouth daily. 90 tablet 3   Calcium-Magnesium-Vitamin D (CALCIUM MAGNESIUM PO) Take 1 tablet by mouth daily.     carvedilol (COREG) 6.25 MG tablet Take 1 tablet by mouth twice daily with meals. 180 tablet 1   Coenzyme Q10 (CO Q 10 PO) Take 1 capsule by mouth daily.     Continuous Blood Gluc Sensor (DEXCOM G6 SENSOR) MISC 1 each by Does not apply route See admin instructions. Use one sensor once every 10 days to monitor blood sugars.     Continuous Blood Gluc Transmit (DEXCOM G6 TRANSMITTER) MISC 1 each by Does not apply route See admin instructions. Use one transmitter once every 90 days to transmit blood sugars.     ezetimibe (ZETIA) 10 MG tablet Take 1 tablet by mouth once daily. 90 tablet 0   glucose blood test strip Use Contour Next test strips as instructed to check blood sugar 4 times daily. DX:E10.65 150 each 1   HUMALOG 100 UNIT/ML injection INJECT 40 UNITS SUBCUTANEOUSLY VIA OMNIPOD INSULIN PUMP 20 mL 0   HUMALOG 100 UNIT/ML injection USE MAX OF 40 UNITS DAILY  VIA INSULIN PUMP 40 mL 2   Insulin Disposable Pump (OMNIPOD 5 G6 INTRO, GEN 5,) KIT See admin instructions.     Insulin Disposable Pump (OMNIPOD 5 G6 POD, GEN 5,) MISC 1 each by Does not apply route every 3 (three) days. 10 each 2   isosorbide mononitrate (IMDUR) 60 MG 24 hr tablet Take 1/2 tablet by mouth every morning, and, take 1 tablet by mouth every evening. 135 tablet 1   lisinopril (ZESTRIL) 10 MG tablet Take 1 tablet by mouth daily. 90 tablet 1   Multiple Vitamin (MULTIVITAMIN) tablet Take 1 tablet by mouth daily.     rosuvastatin (CRESTOR) 40 MG tablet Take 1 tablet by mouth once daily. 90 tablet 0   No current  facility-administered medications for this visit.    Allergies:   Patient has no known allergies.    Social History:  The patient  reports that she has never smoked. She has never used smokeless tobacco. She reports that she does not drink alcohol.   Family History:  The patient's Family history is negative for coronary artery disease.   ROS:  Please see the history of present illness.   Otherwise, review of systems are positive for none.   All other systems are reviewed and negative.    PHYSICAL EXAM: VS:  BP 140/70 (BP Location: Left Arm, Patient Position: Sitting, Cuff Size: Normal)    Pulse 76    Ht $R'5\' 4"'ax$  (1.626 m)    Wt 166 lb 8 oz (75.5 kg)    SpO2 97%    BMI 28.58 kg/m  , BMI Body mass index is 28.58 kg/m. GEN: Well nourished, well developed, in no acute distress  HEENT: normal  Neck: no JVD, carotid bruits, or masses Cardiac: RRR; no murmurs, rubs, or gallops,no edema  Respiratory:  clear to auscultation bilaterally, normal work of breathing GI: soft, nontender, nondistended, + BS MS: no deformity or atrophy  Skin: warm and dry, no rash Neuro:  Strength and sensation are intact Psych: euthymic mood, full affect She does have palpable distal pulses.  EKG:  EKG is ordered today. EKG showed normal sinus rhythm with low voltage and no significant ST or T wave changes.   Recent Labs: 05/01/2021: ALT 25; BUN 23; Creatinine, Ser 0.82; Potassium 4.5; Sodium 141; TSH 4.90    Lipid Panel    Component Value Date/Time   CHOL 137 05/01/2021 0827   TRIG 62.0 05/01/2021 0827   HDL 79.50 05/01/2021 0827   CHOLHDL 2 05/01/2021 0827   VLDL 12.4 05/01/2021 0827   LDLCALC 45 05/01/2021 0827      Wt Readings from Last 3 Encounters:  05/22/21 166 lb 8 oz (75.5 kg)  05/08/21 168 lb 9.6 oz (76.5 kg)  01/23/21 162 lb 6.4 oz (73.7 kg)       No flowsheet data found.    ASSESSMENT AND PLAN:  1.  Coronary artery disease involving native coronary arteries without angina: She  is doing well overall with no anginal symptoms.  Continue medical therapy.  2. Peripheral arterial disease: Currently with no significant claudication and no evidence of critical limb ischemia. Continue medical therapy.  Leg cramps at night do not seem to be due to peripheral arterial disease as they improve with walking.  3. 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   I requested a follow-up carotid Doppler to be done next month.  4. Hyperlipidemia: Continue treatment with rosuvastatin and Zetia.  I reviewed most recent lipid  profile done few weeks ago which showed an LDL of 45 and 20 start of 62.  5. Essential hypertension: Blood pressure is well controlled on current medications.    Disposition:   FU with me in 12 months  Signed,  Kathlyn Sacramento, MD  05/22/2021 9:22 AM    [place]

## 2021-05-25 ENCOUNTER — Other Ambulatory Visit: Payer: Self-pay

## 2021-05-25 ENCOUNTER — Ambulatory Visit (INDEPENDENT_AMBULATORY_CARE_PROVIDER_SITE_OTHER): Payer: Medicare Other | Admitting: Ophthalmology

## 2021-05-25 ENCOUNTER — Encounter (INDEPENDENT_AMBULATORY_CARE_PROVIDER_SITE_OTHER): Payer: Self-pay | Admitting: Ophthalmology

## 2021-05-25 DIAGNOSIS — E103551 Type 1 diabetes mellitus with stable proliferative diabetic retinopathy, right eye: Secondary | ICD-10-CM | POA: Diagnosis not present

## 2021-05-25 DIAGNOSIS — E103511 Type 1 diabetes mellitus with proliferative diabetic retinopathy with macular edema, right eye: Secondary | ICD-10-CM | POA: Diagnosis not present

## 2021-05-25 DIAGNOSIS — I6523 Occlusion and stenosis of bilateral carotid arteries: Secondary | ICD-10-CM

## 2021-05-25 DIAGNOSIS — E113551 Type 2 diabetes mellitus with stable proliferative diabetic retinopathy, right eye: Secondary | ICD-10-CM | POA: Diagnosis not present

## 2021-05-25 DIAGNOSIS — H35373 Puckering of macula, bilateral: Secondary | ICD-10-CM | POA: Diagnosis not present

## 2021-05-25 DIAGNOSIS — E113552 Type 2 diabetes mellitus with stable proliferative diabetic retinopathy, left eye: Secondary | ICD-10-CM | POA: Diagnosis not present

## 2021-05-25 NOTE — Assessment & Plan Note (Signed)
Stable OS, no active PDR today observed

## 2021-05-25 NOTE — Progress Notes (Signed)
05/25/2021     CHIEF COMPLAINT Patient presents for  Chief Complaint  Patient presents with   Retina Follow Up      HISTORY OF PRESENT ILLNESS: Natasha Hernandez is a 81 y.o. female who presents to the clinic today for:   HPI     Retina Follow Up           Diagnosis: Diabetic Retinopathy   Laterality: both eyes   Onset: 1 year ago   Severity: mild   Duration: 1 year   Course: stable         Comments   1 year fu OU and OCT and FP  Pt states VA OU stable since last visit. Pt denies FOL, floaters, or ocular pain OU.  Pt states, "everything is about the same, however, I do have to get things closer to my left eye in order to be able to see things up close."        Last edited by Kendra Opitz, COA on 05/25/2021  3:38 PM.      Referring physician: Alycia Rossetti, MD [address] [place],  [place] [address]  HISTORICAL INFORMATION:   Selected notes from the [facility]    Lab Results  Component Value Date   HGBA1C 9.0 (H) 05/01/2021     CURRENT MEDICATIONS: No current outpatient medications on file. (Ophthalmic Drugs)   No current facility-administered medications for this visit. (Ophthalmic Drugs)   Current Outpatient Medications (Other)  Medication Sig   aspirin EC 81 MG tablet Take 1 tablet (81 mg total) by mouth daily.   Calcium-Magnesium-Vitamin D (CALCIUM MAGNESIUM PO) Take 1 tablet by mouth daily.   carvedilol (COREG) 6.25 MG tablet Take 1 tablet by mouth twice daily with meals.   Coenzyme Q10 (CO Q 10 PO) Take 1 capsule by mouth daily.   Continuous Blood Gluc Sensor (DEXCOM G6 SENSOR) MISC 1 each by Does not apply route See admin instructions. Use one sensor once every 10 days to monitor blood sugars.   Continuous Blood Gluc Transmit (DEXCOM G6 TRANSMITTER) MISC 1 each by Does not apply route See admin instructions. Use one transmitter once every 90 days to transmit blood sugars.   ezetimibe (ZETIA) 10 MG  tablet Take 1 tablet by mouth once daily.   glucose blood test strip Use Contour Next test strips as instructed to check blood sugar 4 times daily. DX:E10.65   HUMALOG 100 UNIT/ML injection INJECT 40 UNITS SUBCUTANEOUSLY VIA OMNIPOD INSULIN PUMP   HUMALOG 100 UNIT/ML injection USE MAX OF 40 UNITS DAILY VIA INSULIN PUMP   Insulin Disposable Pump (OMNIPOD 5 G6 INTRO, GEN 5,) KIT See admin instructions.   Insulin Disposable Pump (OMNIPOD 5 G6 POD, GEN 5,) MISC 1 each by Does not apply route every 3 (three) days.   isosorbide mononitrate (IMDUR) 60 MG 24 hr tablet Take 1/2 tablet by mouth every morning, and, take 1 tablet by mouth every evening.   lisinopril (ZESTRIL) 10 MG tablet Take 1 tablet by mouth daily.   Multiple Vitamin (MULTIVITAMIN) tablet Take 1 tablet by mouth daily.   rosuvastatin (CRESTOR) 40 MG tablet Take 1 tablet by mouth once daily.   No current facility-administered medications for this visit. (Other)      REVIEW OF SYSTEMS:    ALLERGIES No Known Allergies  PAST MEDICAL HISTORY Past Medical History:  Diagnosis Date   Carotid artery occlusion    Coronary artery disease  CABG in 1996. Most recent cardiac catheterization in 2013 showed patent grafts including LIMA to LAD, SVG to OM, SVG to diagonal and SVG to PDA.   Diabetes mellitus without complication (HCC)    Hyperlipidemia    Hypertension    PAD (peripheral artery disease) ([place])    Past Surgical History:  Procedure Laterality Date   CARDIAC CATHETERIZATION     CORONARY ARTERY BYPASS GRAFT  1996   EYE SURGERY      FAMILY HISTORY Family History  Problem Relation Age of Onset   Breast cancer Mother 33   Stroke Mother    Emphysema Father 64   Heart attack Father     SOCIAL HISTORY Social History   Tobacco Use   Smoking status: Never   Smokeless tobacco: Never  Vaping Use   Vaping Use: Never used  Substance Use Topics   Alcohol use: No         OPHTHALMIC EXAM:  Base Eye Exam      Visual Acuity (ETDRS)       Right Left   Dist cc 20/30 -1 20/40 -1   Dist ph cc  NI    Correction: Glasses         Tonometry (Tonopen, 3:42 PM)       Right Left   Pressure 19 20         Pupils       Pupils Shape React APD   Right PERRL Round Brisk None   Left PERRL Round Brisk None         Visual Fields (Counting fingers)       Left Right    Full Full         Extraocular Movement       Right Left    Full, Ortho Full, Ortho         Neuro/Psych     Oriented x3: Yes   Mood/Affect: Normal         Dilation     Both eyes: 1.0% Mydriacyl, 2.5% Phenylephrine @ 3:42 PM           Slit Lamp and Fundus Exam     External Exam       Right Left   External Normal Normal         Slit Lamp Exam       Right Left   Lids/Lashes Normal Normal   Conjunctiva/Sclera White and quiet White and quiet   Cornea Clear Clear   Anterior Chamber Deep and quiet Deep and quiet   Iris Round and reactive Round and reactive   Lens Posterior chamber intraocular lens, Open posterior capsule Posterior chamber intraocular lens, Open posterior capsule   Anterior Vitreous Normal Normal         Fundus Exam       Right Left   Posterior Vitreous Normal Clear media, PVD   Disc Normal Neovascularization old white fibrotic of the disc devoid of vessels, white fibrotic, no traction remains, apparent PVD   C/D Ratio 0.1 0.1   Macula Few microaneurysms,, Epiretinal membrane Few microaneurysms,, Epiretinal membrane   Vessels PDR, quiescent PDR, quiescent   Periphery Good PRP retina attached Good PRP retina attached            IMAGING AND PROCEDURES  Imaging and Procedures for 05/25/21  OCT, Retina - OU - Both Eyes       Right Eye Quality was good. Scan locations included subfoveal. Central Foveal Thickness: 341. Progression has been stable.  Findings include epiretinal membrane.   Left Eye Quality was good. Scan locations included subfoveal. Central Foveal  Thickness: 313. Findings include epiretinal membrane, abnormal foveal contour.   Notes Minor epiretinal membrane and thickening OU, overall stable, will observe       Color Fundus Photography Optos - OU - Both Eyes       Right Eye Disc findings include normal observations. Macula : epiretinal membrane, microaneurysms.   Left Eye Progression has been stable. Disc findings include pallor, neovascularization. Macula : epiretinal membrane, microaneurysms.   Notes Quiescent proliferative diabetic retinopathy with extensive previous peripheral panretinal photocoagulation, stable. 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 Stable treated proliferative diabetic retinopathy of right eye determined by examination associated with type 2 diabetes mellitus ([place])  Q75.9163 Color Fundus Photography Optos - OU - Both Eyes    2. Stable treated proliferative diabetic retinopathy of left eye determined by examination associated with type 2 diabetes mellitus ([facility])  W46.6599 Color Fundus Photography Optos - OU - Both Eyes    3. Bilateral epiretinal membrane  H35.373 OCT, Retina - OU - Both Eyes    Color Fundus Photography Optos - OU - Both Eyes    4. Stable treated proliferative diabetic retinopathy of right eye with macular edema determined by examination associated with type 1 diabetes mellitus ([place])  J57.0177    E10.3511       1.  OU with quiescent  PDR, no active disease in either eye  2.  OU with mild epiretinal membranes no impact on acuity  3.  OS with previous vitreal papillary traction in relation to PVD Will observe  Ophthalmic Meds Ordered this visit:  No orders of the defined types were placed in this encounter.      Return in about 1 year (around 05/25/2022) for DILATE OU, COLOR FP, OCT.  There are no Patient Instructions on file for this visit.   Explained the diagnoses, plan, and follow up with the patient and they expressed understanding.  Patient expressed understanding of the importance of proper follow up care.   Clent Demark Rankin M.D. Diseases & Surgery of the Retina and Vitreous [facility] 05/25/21     Abbreviations: M myopia (nearsighted); A astigmatism; H hyperopia (farsighted); P presbyopia; Mrx spectacle prescription;  CTL contact lenses; OD right eye; OS left eye; OU both eyes  XT exotropia; ET esotropia; PEK punctate epithelial keratitis; PEE punctate epithelial erosions; DES dry eye syndrome; MGD meibomian gland dysfunction; ATs artificial tears; PFAT's preservative free artificial tears; [place] nuclear sclerotic cataract; PSC posterior subcapsular cataract; ERM epi-retinal membrane; PVD posterior vitreous detachment; RD retinal detachment; DM diabetes mellitus; DR diabetic retinopathy; NPDR non-proliferative diabetic retinopathy; PDR proliferative diabetic retinopathy; CSME clinically significant macular edema; DME diabetic macular edema; dbh dot blot hemorrhages; CWS cotton wool spot; POAG primary open angle glaucoma; C/D cup-to-disc ratio; HVF humphrey visual field; GVF goldmann visual field; OCT optical coherence tomography; IOP intraocular pressure; BRVO Branch  retinal vein occlusion; CRVO central retinal vein occlusion; CRAO central retinal artery occlusion; BRAO branch retinal artery occlusion; RT retinal tear; SB scleral buckle; PPV pars plana vitrectomy; VH Vitreous hemorrhage; PRP  panretinal laser photocoagulation; IVK intravitreal kenalog; VMT vitreomacular traction; MH Macular hole;  NVD neovascularization of the disc; NVE neovascularization elsewhere; AREDS age related eye disease study; ARMD age related macular degeneration; POAG primary open angle glaucoma; EBMD epithelial/anterior basement membrane dystrophy; ACIOL anterior chamber intraocular lens; IOL intraocular lens; PCIOL posterior chamber intraocular lens; Phaco/IOL phacoemulsification with intraocular lens placement; [place] photorefractive keratectomy; LASIK laser assisted in situ keratomileusis; HTN hypertension; DM diabetes mellitus; COPD chronic obstructive pulmonary disease

## 2021-05-25 NOTE — Assessment & Plan Note (Signed)
Minor OU, no impact on acuity we will observe

## 2021-05-25 NOTE — Assessment & Plan Note (Signed)
No active maculopathy today, minor epiretinal membrane will observe

## 2021-06-01 ENCOUNTER — Other Ambulatory Visit: Payer: Self-pay

## 2021-06-01 ENCOUNTER — Telehealth (INDEPENDENT_AMBULATORY_CARE_PROVIDER_SITE_OTHER): Payer: Medicare Other | Admitting: Endocrinology

## 2021-06-01 ENCOUNTER — Encounter: Payer: Self-pay | Admitting: Endocrinology

## 2021-06-01 VITALS — Ht 64.0 in | Wt 166.0 lb

## 2021-06-01 DIAGNOSIS — R7989 Other specified abnormal findings of blood chemistry: Secondary | ICD-10-CM

## 2021-06-01 DIAGNOSIS — I10 Essential (primary) hypertension: Secondary | ICD-10-CM | POA: Diagnosis not present

## 2021-06-01 DIAGNOSIS — E1065 Type 1 diabetes mellitus with hyperglycemia: Secondary | ICD-10-CM | POA: Diagnosis not present

## 2021-06-01 NOTE — Progress Notes (Signed)
Patient ID: Natasha Hernandez, female   DOB: 1940-12-08, 81 y.o.   MRN: [id number]            Reason for Appointment : Follow-up for Type 1 Diabetes  I connected with the above-named patient by telephone since the video enabled telemedicine application did not have sound capability I verified that I am speaking with the correct person. The patient was explained the limitations of evaluation and management by telemedicine Patient also understood that there may be a patient responsible charge related to this service  Location of the patient: Patient's home  Location of the provider: Physician office Only the patient, her daughter and myself were participating in the encounter The patient understood the above statements and agreed to proceed.   History of Present Illness          Diagnosis: Type 1 diabetes mellitus, date of diagnosis: 1953          Previous history:  She thinks she has been on various insulin regimens over the years but has been on Lantus for 10-12 years along with mealtime insulin Previously followed by an endocrinologist but no records available. Her A1c has usually been around 7% reportedly and was 7.2 in 7/16   Recent history:    OMNIPOD INSULIN PUMP settings   BASAL rate midnight = 1.05, 3 AM = 0.6.  10 AM = 0.55, #1 PM = 0.70.  6 PM = 0.65 and 9 PM = 0.8 with total basal 15 units  Carbohydrate coverage 1: 10 breakfast and subsequently 1: 6 , sensitivity 1: 35 and active insulin 4 hours. Blood sugar target 150 at all times with correction threshold 180 Reverse correction on Active insulin time 2.5 hours  Current management, blood sugar patterns and problems identified:    Her A1c stays over 8% and last 9%  She has been on the OmniPod 5 pump since about November  On her last visit she was trying to raise her blood sugar in the evenings with extra juice because of fear of hypoglycemia  She is not doing this now  However her blood sugar patterns are  about the same and time in range is slightly worse in the last 2 weeks at 47 compared to 46 No changes were made on her last visit since her pump was not linked  Review of her bolus activity feels that she is likely bolusing AFTER eating breakfast in the morning since she considers her normal blood sugars to be too Also appears that she is missing boluses at dinnertime fairly frequently and may be forgetting to do this  Most of the time she is not entering her CGM value at the time of bolus and only the carbohydrates   CGM information: HIGHEST blood sugar is usually late in the evenings after 10 PM through at least 2 AM, MEDIAN reading at 11 PM + is 239 Blood sugars appear to be rising progressively between about 3 PM until around midnight on an average with more variability in the evenings LOWEST blood sugar is around 7 AM with median reading 153 and range 108-231    Mealtimes are: Breakfast: 8.30 AM, lunch 1-2 PM, Dinner: 7.30   CONTINUOUS GLUCOSE MONITORING RECORD INTERPRETATION of her G6 Dexcom download in January:    Summary of patterns: Blood sugars are overall averaging between 163 and 222 on an hourly basis with highest readings generally between midnight-4 AM and lowest readings in the early mornings  Overall generally blood sugars are relatively  stable with some sporadic fluctuation especially late evenings and overnight  Generally does not appear to have any hypoglycemia with only rare low normal readings at 4 AM and 3 PM  POSTPRANDIAL readings are not clearly rising significantly except occasionally after evening meal  However blood sugars are generally trending higher late in the evening after 9 PM and not improving until at least 3 AM  OVERNIGHT blood sugars are fairly consistently high averaging over 200 between 11 PM and 3:30 AM and then gradually decreasing with variable velocity  Results statistics:  CGM use % of time 96  2-week average/GV 197/22  Time in range      38%   % Time Above 180 49  % Time above 250 13  % Time Below 70 0     PRE-MEAL Fasting Lunch Dinner Bedtime Overall  Glucose range:     91-346  Median:     195   Previous data:  CGM use % of time 93  2-week average/GV 193  Time in range        46%  % Time Above 180 40  % Time above 250 14  % Time Below 70 0      Factors causing Hypoglycemia: Overestimating mealtime coverage, excessive basal rate  Symptoms of hypoglycemia: Some weakness, confusion at times Treatment of hypoglycemia: Corn syrup or jellybeans/other sweets.  She will get 15 g of carbohydrate  Self-care: The diet that the patient has been following is: Carbohydrate counting  Mealtimes are: Breakfast AT 8.30 AM Dinne r: 7.30   For breakfast will have toast with cheese or egg, sometimes cereal and egg  Exercise: walking and that time will use her exercise bike        Dietician consultation: Most recent: Years ago .         CDE consultation: 04/2018   Wt Readings from Last 3 Encounters:  05/22/21 166 lb 8 oz (75.5 kg)  05/08/21 168 lb 9.6 oz (76.5 kg)  01/23/21 162 lb 6.4 oz (73.7 kg)    Lab Results  Component Value Date   HGBA1C 9.0 (H) 05/01/2021   HGBA1C 8.6 (H) 01/16/2021   HGBA1C 8.6 (H) 09/05/2020   Lab Results  Component Value Date   MICROALBUR 0.7 09/05/2020   LDLCALC 45 05/01/2021   CREATININE 0.82 05/01/2021    Lab Results  Component Value Date   MICRALBCREAT 0.7 09/05/2020   MICRALBCREAT 0.9 09/07/2019     Allergies as of 06/01/2021   No Known Allergies      Medication List        Accurate as of June 01, 2021  3:22 PM. If you have any questions, ask your nurse or doctor.          aspirin EC 81 MG tablet Take 1 tablet (81 mg total) by mouth daily.   CALCIUM MAGNESIUM PO Take 1 tablet by mouth daily.   carvedilol 6.25 MG tablet Commonly known as: COREG Take 1 tablet by mouth twice daily with meals.   CO Q 10 PO Take 1 capsule by mouth daily.   Dexcom G6 Sensor  Misc 1 each by Does not apply route See admin instructions. Use one sensor once every 10 days to monitor blood sugars.   Dexcom G6 Transmitter Misc 1 each by Does not apply route See admin instructions. Use one transmitter once every 90 days to transmit blood sugars.   ezetimibe 10 MG tablet Commonly known as: ZETIA Take 1 tablet by mouth once  daily.   glucose blood test strip Use Contour Next test strips as instructed to check blood sugar 4 times daily. DX:E10.65   HumaLOG 100 UNIT/ML injection Generic drug: insulin lispro INJECT 40 UNITS SUBCUTANEOUSLY VIA OMNIPOD INSULIN PUMP   HumaLOG 100 UNIT/ML injection Generic drug: insulin lispro USE MAX OF 40 UNITS DAILY VIA INSULIN PUMP   isosorbide mononitrate 60 MG 24 hr tablet Commonly known as: IMDUR Take 1/2 tablet by mouth every morning, and, take 1 tablet by mouth every evening.   lisinopril 10 MG tablet Commonly known as: ZESTRIL Take 1 tablet by mouth daily.   multivitamin tablet Take 1 tablet by mouth daily.   Omnipod 5 G6 Intro (Gen 5) Kit See admin instructions.   Omnipod 5 G6 Pod (Gen 5) Misc 1 each by Does not apply route every 3 (three) days.   rosuvastatin 40 MG tablet Commonly known as: CRESTOR Take 1 tablet by mouth once daily.        Allergies: No Known Allergies  Past Medical History:  Diagnosis Date   Carotid artery occlusion    Coronary artery disease    CABG in 1996. Most recent cardiac catheterization in 2013 showed patent grafts including LIMA to LAD, SVG to OM, SVG to diagonal and SVG to PDA.   Diabetes mellitus without complication ([place])    Hyperlipidemia    Hypertension    PAD (peripheral artery disease) (HCC)     Past Surgical History:  Procedure Laterality Date   CARDIAC CATHETERIZATION     CORONARY ARTERY BYPASS GRAFT  1996   EYE SURGERY      Family History  Problem Relation Age of Onset   Breast cancer Mother 42   Stroke Mother    Emphysema Father 71   Heart attack  Father     Social History:  reports that she has never smoked. She has never used smokeless tobacco. She reports that she does not drink alcohol. No history on file for drug use.      Review of Systems                  No history of hypothyroidism   with only once increased TSH                                        Lab Results  Component Value Date   TSH 4.90 05/01/2021   TSH 4.90 (H) 09/05/2020   TSH 3.70 10/23/2018   FREET4 0.68 05/01/2021    Lipids:  She is on Crestor 40 mg daily and since 7/19 also on Zetia  Has history of CAD  several years ago with coronary bypass LDL is below 70 as follows   Lab Results  Component Value Date   CHOL 137 05/01/2021   HDL 79.50 05/01/2021   LDLCALC 45 05/01/2021   TRIG 62.0 05/01/2021   CHOLHDL 2 05/01/2021    Has had proliferative but stable retinopathy, has been going regularly for exams  Neuropathy with some sensory loss: Has been prescribed diabetic shoes  Has regular foot exams, followed by podiatrist  HYPERTENSION:  This is controlled with lisinopril 10 mg daily along with low-dose Coreg   BP Readings from Last 3 Encounters:  05/22/21 140/70  05/08/21 (!) 130/56  01/23/21 (!) 122/58     Lab Results  Component Value Date   CREATININE 0.82 05/01/2021   CREATININE 1.07 01/16/2021  CREATININE 0.91 09/05/2020    She refuses to have an influenza vaccine  LABS:  No visits with results within 1 Week(s) from this visit.  Latest known visit with results is:  Lab on 05/01/2021  Component Date Value Ref Range Status   Cholesterol 05/01/2021 137  0 - 200 mg/dL Final   ATP III Classification       Desirable:  < 200 mg/dL               Borderline High:  200 - 239 mg/dL          High:  > = 240 mg/dL   Triglycerides 05/01/2021 62.0  0.0 - 149.0 mg/dL Final   Normal:  <150 mg/dLBorderline High:  150 - 199 mg/dL   HDL 05/01/2021 79.50  >39.00 mg/dL Final   VLDL 05/01/2021 12.4  0.0 - 40.0 mg/dL Final   LDL Cholesterol  05/01/2021 45  0 - 99 mg/dL Final   Total CHOL/HDL Ratio 05/01/2021 2   Final                  Men          Women1/2 Average Risk     3.4          3.3Average Risk          5.0          4.42X Average Risk          9.6          7.13X Average Risk          15.0          11.0                       NonHDL 05/01/2021 57.88   Final   NOTE:  Non-HDL goal should be 30 mg/dL higher than patient's LDL goal (i.e. LDL goal of < 70 mg/dL, would have non-HDL goal of < 100 mg/dL)   Free T4 05/01/2021 0.68  0.60 - 1.60 ng/dL Final   Comment: Specimens from patients who are undergoing biotin therapy and /or ingesting biotin supplements may contain high levels of biotin.  The higher biotin concentration in these specimens interferes with this Free T4 assay.  Specimens that contain high levels  of biotin may cause false high results for this Free T4 assay.  Please interpret results in light of the total clinical presentation of the patient.     TSH 05/01/2021 4.90  0.35 - 5.50 uIU/mL Final   Sodium 05/01/2021 141  135 - 145 mEq/L Final   Potassium 05/01/2021 4.5  3.5 - 5.1 mEq/L Final   Chloride 05/01/2021 101  96 - 112 mEq/L Final   CO2 05/01/2021 33 (H)  19 - 32 mEq/L Final   Glucose, Bld 05/01/2021 166 (H)  70 - 99 mg/dL Final   BUN 05/01/2021 23  6 - 23 mg/dL Final   Creatinine, Ser 05/01/2021 0.82  0.40 - 1.20 mg/dL Final   Total Bilirubin 05/01/2021 0.6  0.2 - 1.2 mg/dL Final   Alkaline Phosphatase 05/01/2021 84  39 - 117 U/L Final   AST 05/01/2021 23  0 - 37 U/L Final   ALT 05/01/2021 25  0 - 35 U/L Final   Total Protein 05/01/2021 6.0  6.0 - 8.3 g/dL Final   Albumin 05/01/2021 4.1  3.5 - 5.2 g/dL Final   GFR 05/01/2021 68.18  >60.00 mL/min Final   Calculated using the CKD-EPI  Creatinine Equation (2021)   Calcium 05/01/2021 9.4  8.4 - 10.5 mg/dL Final   Hgb A1c MFr Bld 05/01/2021 9.0 (H)  4.6 - 6.5 % Final   Glycemic Control Guidelines for People with Diabetes:Non Diabetic:  <6%Goal of Therapy:  <7%Additional Action Suggested:  >8%      Physical Examination:  There were no vitals taken for this visit.    ASSESSMENT:  Diabetes type 1, long-standing with retinopathy  A1c is 9%  See history of present illness for analysis of current diabetes management, blood sugar patterns and problems identified  She is on version 5 Omni pod insulin pump with Humalog insulin and using the Dexcom for CGM  She has only 38 % of readings within target As before she appears to be requiring only relatively small amount of basal units per day, recently about 17 units but her bolus amount is only 37% of the total  Her blood sugars are not being controlled especially in the afternoons and evenings with some variability and this may be related to either setting her target too high or patient not doing enough boluses or correction when needed   PLAN:   She will take a correction bolus anytime her blood sugar is over 250 during the day or evening She will make sure she is boluses BEFORE all meals especially at breakfast She will try to make sure she is bolusing consistently before dinnertime Her daughter will help her make sure she is putting on her CGM glucose value at the time of each bolus Carbohydrate ratio 1:8 at breakfast instead of 10 For now we will keep her sensitivity the same but if she is getting excessive drop in blood sugar with correction we will change it from 35 to about 45 TARGET range for blood sugar will be 130 between 9 PM-3 AM Target 170 between 3 AM-8 AM if able to use this setting Follow-up in 3 months  There are no Patient Instructions on file for this visit.  Duration of visit, evaluation of her pump and CGM download = 15 minutes    Elayne Snare 06/01/2021, 3:22 PM   Note: This note was prepared with Dragon voice recognition system technology. Any transcriptional errors that result from this process are unintentional.

## 2021-06-09 ENCOUNTER — Other Ambulatory Visit: Payer: Self-pay | Admitting: Endocrinology

## 2021-06-09 DIAGNOSIS — E7800 Pure hypercholesterolemia, unspecified: Secondary | ICD-10-CM

## 2021-06-11 ENCOUNTER — Other Ambulatory Visit: Payer: Self-pay

## 2021-06-11 DIAGNOSIS — E1065 Type 1 diabetes mellitus with hyperglycemia: Secondary | ICD-10-CM

## 2021-06-11 MED ORDER — HUMALOG 100 UNIT/ML IJ SOLN: INTRAMUSCULAR | 2 refills | Status: DC

## 2021-07-03 ENCOUNTER — Telehealth: Payer: Self-pay

## 2021-07-03 ENCOUNTER — Other Ambulatory Visit: Payer: Self-pay

## 2021-07-03 ENCOUNTER — Ambulatory Visit (INDEPENDENT_AMBULATORY_CARE_PROVIDER_SITE_OTHER): Payer: Medicare Other

## 2021-07-03 DIAGNOSIS — I6521 Occlusion and stenosis of right carotid artery: Secondary | ICD-10-CM

## 2021-07-03 DIAGNOSIS — I6523 Occlusion and stenosis of bilateral carotid arteries: Secondary | ICD-10-CM | POA: Diagnosis not present

## 2021-07-03 NOTE — Telephone Encounter (Signed)
-----   Message from Iran Ouch, MD sent at 07/03/2021 12:38 PM EDT ----- ?Chronically occluded right carotid artery no significant disease on the left side.  Repeat study in 1 year. ? ?

## 2021-07-03 NOTE — Telephone Encounter (Signed)
DPR on file. ?Patient daughter in law brandy made aware of the patient carotid dopp results with verbalized understanding. ?Order placed for 1 yr repeat testing. ?

## 2021-07-03 NOTE — Telephone Encounter (Signed)
-----   Message from Muhammad A Arida, MD sent at 07/03/2021 12:38 PM EDT ----- ?Chronically occluded right carotid artery no significant disease on the left side.  Repeat study in 1 year. ? ?

## 2021-07-31 ENCOUNTER — Ambulatory Visit (INDEPENDENT_AMBULATORY_CARE_PROVIDER_SITE_OTHER): Payer: Medicare Other | Admitting: Podiatry

## 2021-07-31 DIAGNOSIS — B351 Tinea unguium: Secondary | ICD-10-CM

## 2021-07-31 DIAGNOSIS — E1142 Type 2 diabetes mellitus with diabetic polyneuropathy: Secondary | ICD-10-CM | POA: Diagnosis not present

## 2021-08-09 ENCOUNTER — Encounter: Payer: Self-pay | Admitting: Podiatry

## 2021-08-09 NOTE — Progress Notes (Signed)
?  Subjective:  ?Patient ID: Natasha Hernandez, female    DOB: 07/31/1940,  MRN: [id number] ? ?Natasha Hernandez presents to clinic today for at risk foot care with history of diabetic neuropathy and painful thick toenails that are difficult to trim. Pain interferes with ambulation. Aggravating factors include wearing enclosed shoe gear. Pain is relieved with periodic professional debridement. ? ?Patient states blood glucose was 150 mg/dl today.   ? ?She is accompanied by her granddaughter on today's visit. ? ?New problem(s): None.  ? ?PCP is Elayne Snare, MD , and last visit was June 01, 2021. ? ?She has severe hammertoe deformity of left 2nd digit. Has tried multiple shoes and has diabetic shoes with dorsal cut out of shoe to accommodate left 2nd toe. ? ?No Known Allergies ? ?Review of Systems: Negative except as noted in the HPI. ? ?Objective: No changes noted in today's physical examination. ? ?General: Patient is a pleasant 81 y.o. Caucasian female WD, WN in NAD. AAO x 3.  ? ?Neurovascular Examination: ?CFT immediate b/l LE. Faintly palpable DP/PT pulses b/l LE. Digital hair present b/l. Skin temperature gradient WNL b/l. No pain with calf compression b/l. No edema noted b/l. No cyanosis or clubbing noted b/l LE. ? ?Protective sensation diminished with 10g monofilament b/l. Vibratory sensation intact b/l. ? ?Dermatological:  ?Pedal skin thin, shiny and atrophic b/l LE. She has a small scab noted dorsal IPJ of left hallux. No erythema, no edema, no drainage, no fluctuance, no ischemia, no gangrene. No open wounds b/l LE. No interdigital macerations noted b/l LE. Toenails 1-5 b/l elongated, discolored, dystrophic, thickened, crumbly with subungual debris and tenderness to dorsal palpation. No hyperkeratotic nor porokeratotic lesions present on today's visit. ? ?Musculoskeletal:  ?Muscle strength 5/5 to all lower extremity muscle groups bilaterally. Severe hammertoe deformity noted 1-5 bilaterally. Patient  ambulates independent of any assistive aids. ? ? ?  Latest Ref Rng & Units 05/01/2021  ?  8:27 AM 01/16/2021  ?  8:59 AM 09/05/2020  ?  9:27 AM  ?Hemoglobin A1C  ?Hemoglobin-A1c 4.6 - 6.5 % 9.0   8.6   8.6    ? ?Assessment/Plan: ?1. Onychomycosis   ?2. Diabetic peripheral neuropathy associated with type 2 diabetes mellitus ([place])   ?  ?-Patient was evaluated and treated. All patient's and/or POA's questions/concerns answered on today's visit. ?-Continue diabetic shoes daily. ?-Toenails 1-5 b/l were debrided in length and girth with sterile nail nippers and dremel without iatrogenic bleeding.  ?-Patient/POA to call should there be question/concern in the interim.  ? ?Return in about 3 months (around 10/30/2021). ? ?Marzetta Board, DPM  ?

## 2021-09-11 ENCOUNTER — Other Ambulatory Visit: Payer: Self-pay | Admitting: Endocrinology

## 2021-09-11 ENCOUNTER — Other Ambulatory Visit: Payer: Self-pay | Admitting: Cardiovascular Disease

## 2021-09-11 DIAGNOSIS — E7800 Pure hypercholesterolemia, unspecified: Secondary | ICD-10-CM

## 2021-10-26 ENCOUNTER — Other Ambulatory Visit: Payer: Self-pay

## 2021-10-26 DIAGNOSIS — E1065 Type 1 diabetes mellitus with hyperglycemia: Secondary | ICD-10-CM

## 2021-10-26 MED ORDER — HUMALOG 100 UNIT/ML IJ SOLN: INTRAMUSCULAR | 2 refills | Status: DC

## 2021-11-01 ENCOUNTER — Other Ambulatory Visit: Payer: Self-pay | Admitting: Endocrinology

## 2021-11-01 DIAGNOSIS — E1065 Type 1 diabetes mellitus with hyperglycemia: Secondary | ICD-10-CM

## 2021-11-06 ENCOUNTER — Encounter: Payer: Self-pay | Admitting: Podiatry

## 2021-11-06 ENCOUNTER — Ambulatory Visit (INDEPENDENT_AMBULATORY_CARE_PROVIDER_SITE_OTHER): Payer: Medicare Other | Admitting: Podiatry

## 2021-11-06 DIAGNOSIS — B351 Tinea unguium: Secondary | ICD-10-CM | POA: Diagnosis not present

## 2021-11-06 DIAGNOSIS — I739 Peripheral vascular disease, unspecified: Secondary | ICD-10-CM

## 2021-11-06 DIAGNOSIS — M79674 Pain in right toe(s): Secondary | ICD-10-CM | POA: Diagnosis not present

## 2021-11-06 DIAGNOSIS — E119 Type 2 diabetes mellitus without complications: Secondary | ICD-10-CM | POA: Diagnosis not present

## 2021-11-06 DIAGNOSIS — M79675 Pain in left toe(s): Secondary | ICD-10-CM | POA: Diagnosis not present

## 2021-11-06 DIAGNOSIS — Z951 Presence of aortocoronary bypass graft: Secondary | ICD-10-CM

## 2021-11-06 DIAGNOSIS — L84 Corns and callosities: Secondary | ICD-10-CM

## 2021-11-06 DIAGNOSIS — E1042 Type 1 diabetes mellitus with diabetic polyneuropathy: Secondary | ICD-10-CM | POA: Diagnosis not present

## 2021-11-06 DIAGNOSIS — E7800 Pure hypercholesterolemia, unspecified: Secondary | ICD-10-CM

## 2021-11-08 ENCOUNTER — Other Ambulatory Visit: Payer: Self-pay | Admitting: Endocrinology

## 2021-11-14 NOTE — Progress Notes (Signed)
ANNUAL DIABETIC FOOT EXAM  Subjective: Natasha Hernandez presents today for annual diabetic foot examination, at risk foot care with history of diabetic neuropathy, and painful elongated mycotic toenails 1-5 bilaterally which are tender when wearing enclosed shoe gear. Pain is relieved with periodic professional debridement..  Patient confirms h/o diabetes.  Patient has h/o foot ulcer of left foot, which healed via help of local wound care.  Patient admits symptoms of foot numbness.  Patient has been diagnosed with neuropathy.  Patient's blood sugar was 247 mg/dl today. Last known  HgA1c was 8.2%   Risk factors: diabetes, diabetic neuropathy, PAD, h/o MI, HTN, hyperlipidemia, foot deformity.  Elayne Snare, MD is patient's PCP. Last visit was June 01, 2021.  Past Medical History:  Diagnosis Date   Carotid artery occlusion    Coronary artery disease    CABG in 1996. Most recent cardiac catheterization in 2013 showed patent grafts including LIMA to LAD, SVG to OM, SVG to diagonal and SVG to PDA.   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 determined by examination associated with type 2 diabetes mellitus ([place]) 08/13/2019   Stable treated proliferative diabetic retinopathy of right eye with macular edema determined by examination associated with type 1 diabetes mellitus ([place]) 08/13/2019   Stable treated proliferative diabetic retinopathy of left eye with macular edema determined by examination associated with type 1 diabetes mellitus ([place]) 08/13/2019   Pneumonia due to COVID-19 virus 03/27/2019   Onychomycosis of multiple toenails with type 2 diabetes mellitus and  peripheral neuropathy ([place]) 09/29/2018   Pre-ulcerative calluses 09/29/2018   Type 1 diabetes mellitus ([place]) 06/02/2018   Degeneration of lumbar intervertebral disc 05/31/2018   Scoliosis deformity of spine 05/31/2018   Chest pain, rule out acute myocardial infarction 04/16/2018   AKI (acute kidney injury) ([place]) 04/16/2018   CAD (coronary artery disease) 09/27/2016   Hx of CABG 09/27/2016   Right carotid artery occlusion 09/27/2016   PAD (peripheral artery disease) ([place]) 09/27/2016   Essential hypertension 07/10/2016   Uncontrolled type 1 diabetes mellitus with hyperglycemia ([place]) 05/07/2016   Pure hypercholesterolemia 05/07/2016   Diabetic peripheral neuropathy associated with type 1 diabetes mellitus ([place]) 05/07/2016   Pain in finger of right hand 04/13/2016   Stiffness of finger joint of right hand 04/13/2016   Stiffness of right wrist joint 04/13/2016   Closed fracture of right distal radius 04/08/2016   Past Surgical History:  Procedure Laterality Date   [place]   EYE SURGERY     Current Outpatient Medications on File Prior to Visit  Medication Sig Dispense Refill   ketorolac (TORADOL) 10 MG tablet Take 1 tablet every 6 hours by oral route.     tizanidine (ZANAFLEX) 2 MG capsule Take 1 capsule 3 times a day by oral route as needed for 10 days.     aspirin EC 81 MG tablet Take 1 tablet (81 mg total) by mouth daily. 90 tablet 3   Calcium-Magnesium-Vitamin D (CALCIUM MAGNESIUM PO) Take 1 tablet by mouth daily.     carvedilol (COREG) 6.25 MG tablet Take 1 tablet by mouth twice daily with meals. 180 tablet  0   Coenzyme Q10 (CO Q 10 PO) Take 1 capsule by mouth daily.     Continuous Blood Gluc Sensor (DEXCOM G6 SENSOR) MISC 1 each by Does not apply route See admin instructions. Use one sensor once every 10 days to monitor blood sugars.     Continuous Blood Gluc Transmit (DEXCOM G6 TRANSMITTER) MISC 1 each by Does not apply route  See admin instructions. Use one transmitter once every 90 days to transmit blood sugars.     ezetimibe (ZETIA) 10 MG tablet Take 1 tablet by mouth once daily. 90 tablet 0   glucose blood test strip Use Contour Next test strips as instructed to check blood sugar 4 times daily. DX:E10.65 150 each 1   HUMALOG 100 UNIT/ML injection USE MAX OF 40 UNITS DAILY VIA INSULIN PUMP 40 mL 2   Insulin Disposable Pump (OMNIPOD 5 G6 INTRO, GEN 5,) KIT See admin instructions.     Insulin Disposable Pump (OMNIPOD 5 G6 POD, GEN 5,) MISC APPLY EVERY 3 DAYS 10 each 3   isosorbide mononitrate (IMDUR) 60 MG 24 hr tablet Take 1/2 tablet by mouth every morning, and, take 1 tablet by mouth every evening. 135 tablet 0   lisinopril (ZESTRIL) 20 MG tablet      Multiple Vitamin (MULTIVITAMIN) tablet Take 1 tablet by mouth daily.     rosuvastatin (CRESTOR) 40 MG tablet Take 1 tablet by mouth once daily. 90 tablet 0   No current facility-administered medications on file prior to visit.    No Known Allergies Social History   Occupational History   Occupation: Retired  Tobacco Use   Smoking status: Never   Smokeless tobacco: Never  Vaping Use   Vaping Use: Never used  Substance and Sexual Activity   Alcohol use: No   Drug use: Not on file   Sexual activity: Not on file   Family History  Problem Relation Age of Onset   Breast cancer Mother 76   Stroke Mother    Emphysema Father 61   Heart attack Father    Immunization History  Administered Date(s) Administered   Fluad Quad(high Dose 65+) 05/18/2019   Influenza, High Dose Seasonal PF 03/20/2018, 02/22/2020   Pneumococcal Conjugate-13 05/07/2016     Review of Systems: Negative except as noted in the HPI.   Objective: There were no vitals filed for this visit.  Natasha Hernandez is a pleasant 81 y.o. female in NAD. AAO X 3.  General: Patient is a pleasant 81 y.o. Caucasian female WD, WN in NAD. AAO x 3.   Neurovascular Examination: CFT immediate b/l  LE. Faintly palpable DP/PT pulses b/l LE. Digital hair present b/l. Skin temperature gradient WNL b/l. No pain with calf compression b/l. No edema noted b/l. No cyanosis or clubbing noted b/l LE.  Protective sensation diminished with 10g monofilament b/l. Vibratory sensation intact b/l.  Dermatological:  Pedal skin thin, shiny and atrophic b/l LE. She has a small scab noted dorsal IPJ of left hallux. No erythema, no edema, no drainage, no fluctuance, no ischemia, no gangrene. No open wounds b/l LE. No interdigital macerations noted b/l LE. Toenails 1-5 b/l elongated, discolored, dystrophic, thickened, crumbly with subungual debris and tenderness to dorsal palpation. Hyperkeratotic lesion(s) submet head 1 right foot and submet head 5 right foot. No erythema, no edema, no drainage, no fluctuance noted.  Musculoskeletal:  Muscle strength 5/5 to all lower extremity muscle groups bilaterally. Severe hammertoe deformity noted 1-5 bilaterally. Cavus foot type b/l. Atrophy of  plantar forefoot b/l. Plantarflexed metatarsals 1st and 5th b/l.  Footwear Assessment: Does the patient wear appropriate shoes? Yes. Does the patient need inserts/orthotics? Yes.     Latest Ref Rng & Units 05/01/2021    8:27 AM 01/16/2021    8:59 AM  Hemoglobin A1C  Hemoglobin-A1c 4.6 - 6.5 % 9.0  8.6    Assessment: 1. Pain due to onychomycosis of toenails of both feet   2. Pre-ulcerative calluses   3. PAD (peripheral artery disease) ([place])   4. Pure hypercholesterolemia   5. Diabetic peripheral neuropathy associated with type 1 diabetes mellitus ([place])   6. Encounter for diabetic foot exam ([place])     ADA Risk Categorization:  High Risk  Patient has one or more of the following: Loss of protective sensation Absent pedal pulses Severe Foot deformity History of foot ulcer  Plan: -Patient was evaluated and treated. All patient's and/or POA's questions/concerns answered on today's visit. -Patient flagged in [place] for Limb  at Risk. Ordered noninvasive arterial studies ABIs with and without TBIs for b/l lower extremities. -Patient casted for custom diabetic insoles only today. Offload submet head 1, 5 b/l. -Diabetic foot examination performed today. -Toenails 1-5 b/l were debrided in length and girth with sterile nail nippers and dremel without iatrogenic bleeding.  -Callus(es) submet head 1 right foot and submet head 5 right foot pared utilizing sterile scalpel blade without complication or incident. Total number debrided =2. -Patient/POA to call should there be question/concern in the interim. Return in about 3 months (around 02/06/2022).  Marzetta Board, DPM

## 2021-11-20 ENCOUNTER — Ambulatory Visit (HOSPITAL_COMMUNITY)
Admission: RE | Admit: 2021-11-20 | Discharge: 2021-11-20 | Disposition: A | Discharge status: Home / Self Care | Payer: Medicare Other | Source: Ambulatory Visit | Attending: Cardiology | Admitting: Cardiology

## 2021-11-20 DIAGNOSIS — E7800 Pure hypercholesterolemia, unspecified: Secondary | ICD-10-CM | POA: Diagnosis not present

## 2021-11-20 DIAGNOSIS — E1042 Type 1 diabetes mellitus with diabetic polyneuropathy: Secondary | ICD-10-CM | POA: Diagnosis not present

## 2021-11-20 DIAGNOSIS — I739 Peripheral vascular disease, unspecified: Secondary | ICD-10-CM | POA: Diagnosis not present

## 2021-11-23 ENCOUNTER — Telehealth (INDEPENDENT_AMBULATORY_CARE_PROVIDER_SITE_OTHER): Payer: Medicare Other | Admitting: Podiatry

## 2021-11-23 DIAGNOSIS — I739 Peripheral vascular disease, unspecified: Secondary | ICD-10-CM

## 2021-11-23 NOTE — Telephone Encounter (Signed)
1st attempt to give results of bilateral ABI testing. LVM that I will call again after clinic this evening.

## 2021-12-07 ENCOUNTER — Other Ambulatory Visit: Payer: Self-pay | Admitting: Cardiovascular Disease

## 2021-12-07 ENCOUNTER — Other Ambulatory Visit: Payer: Self-pay | Admitting: Endocrinology

## 2021-12-07 DIAGNOSIS — E7800 Pure hypercholesterolemia, unspecified: Secondary | ICD-10-CM

## 2021-12-18 ENCOUNTER — Telehealth (INDEPENDENT_AMBULATORY_CARE_PROVIDER_SITE_OTHER): Payer: Medicare Other | Admitting: Podiatry

## 2021-12-18 NOTE — Telephone Encounter (Signed)
Discussed results of ABI testing with daughter, Merry Proud and son. She currently has no rest pain or frank claudication symptoms. Her symptoms appear to be more of neuropathic pain. Advised to continue routine appts, avoid toe injuries, no pedicures. Discussed microvascular disease in diabetics. If they desire Vascular referral, I will be happy to place the order. Otherwise, I will see her at her next scheduled appt. They related understanding.

## 2021-12-30 ENCOUNTER — Other Ambulatory Visit: Payer: Self-pay

## 2021-12-30 DIAGNOSIS — E1065 Type 1 diabetes mellitus with hyperglycemia: Secondary | ICD-10-CM

## 2021-12-30 MED ORDER — HUMALOG 100 UNIT/ML IJ SOLN: INTRAMUSCULAR | 2 refills | Status: DC

## 2022-01-01 ENCOUNTER — Other Ambulatory Visit (INDEPENDENT_AMBULATORY_CARE_PROVIDER_SITE_OTHER): Payer: Medicare Other

## 2022-01-01 DIAGNOSIS — E1065 Type 1 diabetes mellitus with hyperglycemia: Secondary | ICD-10-CM

## 2022-01-01 LAB — BASIC METABOLIC PANEL
BUN: 26 mg/dL — ABNORMAL HIGH (ref 6–23)
CO2: 32 mEq/L (ref 19–32)
Calcium: 9.5 mg/dL (ref 8.4–10.5)
Chloride: 103 mEq/L (ref 96–112)
Creatinine, Ser: 0.92 mg/dL (ref 0.40–1.20)
GFR: 59.11 mL/min — ABNORMAL LOW (ref >60.00)
Glucose, Bld: 181 mg/dL — ABNORMAL HIGH (ref 70–99)
Potassium: 4.9 mEq/L (ref 3.5–5.1)
Sodium: 140 mEq/L (ref 135–145)

## 2022-01-01 LAB — HEMOGLOBIN A1C: Hgb A1c MFr Bld: 8.9 % — ABNORMAL HIGH (ref 4.6–6.5)

## 2022-01-08 ENCOUNTER — Ambulatory Visit: Payer: Medicare Other | Admitting: Endocrinology

## 2022-01-12 ENCOUNTER — Encounter: Payer: Self-pay | Admitting: Endocrinology

## 2022-01-12 DIAGNOSIS — E1065 Type 1 diabetes mellitus with hyperglycemia: Secondary | ICD-10-CM

## 2022-01-12 MED ORDER — DEXCOM G6 SENSOR MISC: 1.0000 | 0 refills | Status: AC

## 2022-01-18 ENCOUNTER — Telehealth: Payer: Self-pay | Admitting: Endocrinology

## 2022-01-18 NOTE — Telephone Encounter (Signed)
MEDICATION: Continuous Blood Gluc Sensor (DEXCOM G6 SENSOR) MISC  PHARMACY:    HAS THE PATIENT CONTACTED THEIR PHARMACY?  yes  IS THIS A 90 DAY SUPPLY : yes  IS PATIENT OUT OF MEDICATION: no  IF NOT; HOW MUCH IS LEFT:   LAST APPOINTMENT DATE: @02 /13/2023  NEXT APPOINTMENT DATE:@12 /03/2022  DO WE HAVE YOUR PERMISSION TO LEAVE A DETAILED MESSAGE?:Yes  OTHER COMMENTS:    **Let patient know to contact pharmacy at the end of the day to make sure medication is ready. **  ** Please notify patient to allow 48-72 hours to process**  **Encourage patient to contact the pharmacy for refills or they can request refills through [facility]**

## 2022-01-18 NOTE — Telephone Encounter (Signed)
I just sent 30 day supply last week while family was on vacation and they confirmed receiving.

## 2022-01-20 ENCOUNTER — Ambulatory Visit (INDEPENDENT_AMBULATORY_CARE_PROVIDER_SITE_OTHER): Payer: Medicare Other | Admitting: Endocrinology

## 2022-01-20 ENCOUNTER — Encounter: Payer: Self-pay | Admitting: Endocrinology

## 2022-01-20 VITALS — BP 130/60 | HR 73 | Ht 64.0 in | Wt 165.8 lb

## 2022-01-20 DIAGNOSIS — R7989 Other specified abnormal findings of blood chemistry: Secondary | ICD-10-CM | POA: Diagnosis not present

## 2022-01-20 DIAGNOSIS — E7800 Pure hypercholesterolemia, unspecified: Secondary | ICD-10-CM

## 2022-01-20 DIAGNOSIS — E1065 Type 1 diabetes mellitus with hyperglycemia: Secondary | ICD-10-CM | POA: Diagnosis not present

## 2022-01-20 DIAGNOSIS — I6523 Occlusion and stenosis of bilateral carotid arteries: Secondary | ICD-10-CM

## 2022-01-20 DIAGNOSIS — I10 Essential (primary) hypertension: Secondary | ICD-10-CM

## 2022-01-20 NOTE — Patient Instructions (Addendum)
[employer] 130 after 7 pm  [place] for all meals before eating and sugars over 200 in between

## 2022-01-20 NOTE — Progress Notes (Signed)
Patient ID: Natasha Hernandez Friday, female   DOB: 03/26/1941, 80 y.o.   MRN: [id number]            Reason for Appointment : Follow-up for Type 1 Diabetes    History of Present Illness          Diagnosis: Type 1 diabetes mellitus, date of diagnosis: 1953          Previous history:  She thinks she has been on various insulin regimens over the years but has been on Lantus for 10-12 years along with mealtime insulin Previously followed by an endocrinologist but no records available. Her A1c has usually been around 7% reportedly and was 7.2 in 7/16   Recent history:    OMNIPOD INSULIN PUMP settings   BASAL rate midnight = 1.05, 3 AM = 0.6.  10 AM = 0.55, #1 PM = 0.70.  6 PM = 0.65 and 9 PM = 0.8 with total basal 15 units  Carbohydrate coverage 1: 10 breakfast and subsequently 1: 6 , sensitivity 1: 35 and active insulin 4 hours. Blood sugar target 150  at all times with correction threshold 180 Reverse correction on Active insulin time 2.5 hours  Current management, blood sugar patterns and problems identified:    Her A1c stays over 8% and now 8.9 compared to 9%  She has been on the OmniPod 5 pump since about 02/2021 Her daughter is generally trying to help her with diabetes management but since she is working she is not able to help her most of the time during the day  Her main difficulty recently appears to be not bolusing for lunch, likely from forgetting to bolus  As usual she is sometimes not bolusing before starting treatment blood sugars are normal Her time in the automated mode is only 58% At times she will go into the manual mode and continue a couple of days and does not try to bring it back to automated mode as she does not know how to do it herself  Again not clear if she is actually entering her blood sugar at the time of her boluses with the carbs Not always trying to make corrections for high readings Also boluses are frequently delayed till the blood sugar has gone  up significantly  Generally overnight blood sugars are also periodically high despite being in the automated mode     Mealtimes are: Breakfast: 8.30 AM, lunch 1-2 PM, Dinner: 7.30   CONTINUOUS GLUCOSE MONITORING RECORD INTERPRETATION of her G6 Dexcom download     Summary of patterns: Blood sugars are mostly averaging over the target range of 180 except part of the night; HIGHEST blood sugars are late afternoons and late evenings.  No hypoglycemia seen  Blood sugars are inconsistent from day-to-day Overnight blood sugars are usually starting of averaging around 230 with moderate variability and generally decreasing gradually until about 6 AM with fasting average about 170  Pre-meal blood sugars are about 180-190 averaging before dinner POSTPRANDIAL readings are inconsistent with significant postprandial spikes at all meals but most frequently after lunch Blood sugars were more consistently above target between 10/1 and 10/3  Highest blood sugars can occasionally be over 350 in the late afternoon  Results statistics:  CGM use % of time   2-week average/GV 202/24  Time in range      40%  % Time Above 180 44  % Time above 250 60  % Time Below 70 0     Previous data:  CGM   use % of time 96  2-week average/GV 197/22  Time in range      38%  % Time Above 180 49  % Time above 250 13  % Time Below 70 0     PRE-MEAL Fasting Lunch Dinner Bedtime Overall  Glucose range:     91-346  Median:     195    Factors causing Hypoglycemia: Overestimating mealtime coverage, excessive basal rate  Symptoms of hypoglycemia: Some weakness, confusion at times Treatment of hypoglycemia: Corn syrup or jellybeans/other sweets.  She will get 15 g of carbohydrate  Self-care: The diet that the patient has been following is: Carbohydrate counting  Mealtimes are: Breakfast AT 8.30 AM Dinne r: 7.30   For breakfast will have toast with cheese or egg, sometimes cereal and egg  Exercise: walking and that  time will use her exercise bike        Dietician consultation: Most recent: Years ago .         CDE consultation: 04/2018   Wt Readings from Last 3 Encounters:  01/20/22 165 lb 12.8 oz (75.2 kg)  06/01/21 166 lb (75.3 kg)  05/22/21 166 lb 8 oz (75.5 kg)    Lab Results  Component Value Date   HGBA1C 8.9 (H) 01/01/2022   HGBA1C 9.0 (H) 05/01/2021   HGBA1C 8.6 (H) 01/16/2021   Lab Results  Component Value Date   MICROALBUR 0.7 09/05/2020   LDLCALC 45 05/01/2021   CREATININE 0.92 01/01/2022    Lab Results  Component Value Date   MICRALBCREAT 0.7 09/05/2020   MICRALBCREAT 0.9 09/07/2019     Allergies as of 01/20/2022   No Known Allergies      Medication List        Accurate as of January 20, 2022 11:59 PM. If you have any questions, ask your nurse or doctor.          aspirin EC 81 MG tablet Take 1 tablet (81 mg total) by mouth daily.   CALCIUM MAGNESIUM PO Take 1 tablet by mouth daily.   carvedilol 6.25 MG tablet Commonly known as: COREG Take 1 tablet by mouth twice daily with meals.   CO Q 10 PO Take 1 capsule by mouth daily.   Dexcom G6 Sensor Misc 1 each by Does not apply route See admin instructions. Use one sensor once every 10 days to monitor blood sugars.   Dexcom G6 Transmitter Misc 1 each by Does not apply route See admin instructions. Use one transmitter once every 90 days to transmit blood sugars.   ezetimibe 10 MG tablet Commonly known as: ZETIA Take 1 tablet by mouth once daily.   glucose blood test strip Use Contour Next test strips as instructed to check blood sugar 4 times daily. DX:E10.65   HumaLOG 100 UNIT/ML injection Generic drug: insulin lispro USE MAX OF 40 UNITS DAILY VIA INSULIN PUMP   isosorbide mononitrate 60 MG 24 hr tablet Commonly known as: IMDUR Take 1/2 tablet by mouth every morning, and, take 1 tablet by mouth every evening.   ketorolac 10 MG tablet Commonly known as: TORADOL Take 1 tablet every 6 hours by oral  route.   lisinopril 20 MG tablet Commonly known as: ZESTRIL   lisinopril 10 MG tablet Commonly known as: ZESTRIL Take 1 tablet by mouth daily.   multivitamin tablet Take 1 tablet by mouth daily.   Omnipod 5 G6 Intro (Gen 5) Kit See admin instructions.   Omnipod 5 G6 Pod (Gen 5) Misc APPLY  EVERY 3 DAYS   rosuvastatin 40 MG tablet Commonly known as: CRESTOR Take 1 tablet by mouth once daily.   tizanidine 2 MG capsule Commonly known as: ZANAFLEX Take 1 capsule 3 times a day by oral route as needed for 10 days.        Allergies: No Known Allergies  Past Medical History:  Diagnosis Date   Carotid artery occlusion    Coronary artery disease    CABG in 1996. Most recent cardiac catheterization in 2013 showed patent grafts including LIMA to LAD, SVG to OM, SVG to diagonal and SVG to PDA.   Diabetes mellitus without complication (HCC)    Hyperlipidemia    Hypertension    PAD (peripheral artery disease) (HCC)     Past Surgical History:  Procedure Laterality Date   CARDIAC CATHETERIZATION     CORONARY ARTERY BYPASS GRAFT  1996   EYE SURGERY      Family History  Problem Relation Age of Onset   Breast cancer Mother 87   Stroke Mother    Emphysema Father 78   Heart attack Father     Social History:  reports that she has never smoked. She has never used smokeless tobacco. She reports that she does not drink alcohol. No history on file for drug use.      Review of Systems                  No history of hypothyroidism                                        Lab Results  Component Value Date   TSH 4.90 05/01/2021   TSH 4.90 (H) 09/05/2020   TSH 3.70 10/23/2018   FREET4 0.68 05/01/2021    Lipids:  She is on Crestor 40 mg daily and since 7/19 also on Zetia  Has history of CAD  several years ago with coronary bypass LDL is below 70 as follows   Lab Results  Component Value Date   CHOL 137 05/01/2021   HDL 79.50 05/01/2021   LDLCALC 45 05/01/2021   TRIG  62.0 05/01/2021   CHOLHDL 2 05/01/2021    Has had proliferative but stable retinopathy, has been going regularly for exams 2/23  Neuropathy with some sensory loss: Has been prescribed diabetic shoes  Has regular foot exams, followed by podiatrist  HYPERTENSION:  This is controlled with lisinopril 10 mg daily along with low-dose Coreg Blood pressure was initially high when she first came in  BP Readings from Last 3 Encounters:  01/20/22 130/60  05/22/21 140/70  05/08/21 (!) 130/56     Lab Results  Component Value Date   CREATININE 0.92 01/01/2022   CREATININE 0.82 05/01/2021   CREATININE 1.07 01/16/2021    She refuses to have an influenza vaccine  LABS:  No visits with results within 1 Week(s) from this visit.  Latest known visit with results is:  Lab on 01/01/2022  Component Date Value Ref Range Status   Sodium 01/01/2022 140  135 - 145 mEq/L Final   Potassium 01/01/2022 4.9  3.5 - 5.1 mEq/L Final   Chloride 01/01/2022 103  96 - 112 mEq/L Final   CO2 01/01/2022 32  19 - 32 mEq/L Final   Glucose, Bld 01/01/2022 181 (H)  70 - 99 mg/dL Final   BUN 01/01/2022 26 (H)  6 - 23 mg/dL Final     Creatinine, Ser 01/01/2022 0.92  0.40 - 1.20 mg/dL Final   GFR 01/01/2022 59.11 (L)  >60.00 mL/min Final   Calculated using the CKD-EPI Creatinine Equation (2021)   Calcium 01/01/2022 9.5  8.4 - 10.5 mg/dL Final   Hgb A1c MFr Bld 01/01/2022 8.9 (H)  4.6 - 6.5 % Final   Glycemic Control Guidelines for People with Diabetes:Non Diabetic:  <6%Goal of Therapy: <7%Additional Action Suggested:  >8%      Physical Examination:  BP 130/60 (Patient Position: Standing)   Pulse 73   Ht 5' 4" (1.626 m)   Wt 165 lb 12.8 oz (75.2 kg)   SpO2 96%   BMI 28.46 kg/m     ASSESSMENT:  Diabetes type 1, long-standing with retinopathy  A1c is 8.9 %  See history of present illness for analysis of current diabetes management, blood sugar patterns and problems identified  She is on version 5  Omnipod insulin pump with Humalog insulin and using the Dexcom for CGM  Her blood sugars were analyzed from her Dexcom download and day-to-day management was reviewed in detail from pump download  She has only 40 % of readings within target similar to her last visit Part of her hyperglycemia is related to not being in the automated mode and only 58% within that mortality She is herself not able to remember how to put her back in the automated mode and not able to get the help of her daughter all the time Boluses are also either late or insufficient and frequently missed at lunch  Since she is not very comfortable with using technology her level of control is going to be difficult Also usually is afraid of getting low sugars and does not make corrections as needed  Hypertension: Well-controlled  PLAN:   Since her blood sugars are trending higher after about 6-7 PM she can make a target 130 after 7 PM She will take a correction bolus for blood sugars over 200 in between meals Reminded her to bolus before starting to eat since she knows that any carbohydrate will raise her blood sugar She will need to try and set up reminder to bolus before lunch daily She will try to restart the automated mode as soon as possible with the help of her daughter especially if she needs to make a correction for high readings  Patient Instructions  Target 130 after 7 pm  Bolus for all meals before eating and sugars over 200 in between    Elayne Snare 01/21/2022, 12:58 PM   Note: This note was prepared with Dragon voice recognition system technology. Any transcriptional errors that result from this process are unintentional.

## 2022-01-22 ENCOUNTER — Encounter: Payer: Self-pay | Admitting: Endocrinology

## 2022-02-05 DIAGNOSIS — Z794 Long term (current) use of insulin: Secondary | ICD-10-CM | POA: Diagnosis not present

## 2022-02-05 DIAGNOSIS — H0101A Ulcerative blepharitis right eye, upper and lower eyelids: Secondary | ICD-10-CM | POA: Diagnosis not present

## 2022-02-05 DIAGNOSIS — H18423 Band keratopathy, bilateral: Secondary | ICD-10-CM | POA: Diagnosis not present

## 2022-02-05 DIAGNOSIS — Z961 Presence of intraocular lens: Secondary | ICD-10-CM | POA: Diagnosis not present

## 2022-02-05 DIAGNOSIS — H35373 Puckering of macula, bilateral: Secondary | ICD-10-CM | POA: Diagnosis not present

## 2022-02-05 DIAGNOSIS — E113553 Type 2 diabetes mellitus with stable proliferative diabetic retinopathy, bilateral: Secondary | ICD-10-CM | POA: Diagnosis not present

## 2022-02-07 ENCOUNTER — Other Ambulatory Visit: Payer: Self-pay | Admitting: Endocrinology

## 2022-02-12 ENCOUNTER — Telehealth: Payer: Self-pay | Admitting: Endocrinology

## 2022-02-12 ENCOUNTER — Ambulatory Visit (INDEPENDENT_AMBULATORY_CARE_PROVIDER_SITE_OTHER): Payer: Medicare Other | Admitting: Podiatry

## 2022-02-12 ENCOUNTER — Encounter: Payer: Self-pay | Admitting: Podiatry

## 2022-02-12 DIAGNOSIS — M79675 Pain in left toe(s): Secondary | ICD-10-CM | POA: Diagnosis not present

## 2022-02-12 DIAGNOSIS — L84 Corns and callosities: Secondary | ICD-10-CM | POA: Diagnosis not present

## 2022-02-12 DIAGNOSIS — E1042 Type 1 diabetes mellitus with diabetic polyneuropathy: Secondary | ICD-10-CM

## 2022-02-12 DIAGNOSIS — B351 Tinea unguium: Secondary | ICD-10-CM

## 2022-02-12 DIAGNOSIS — I739 Peripheral vascular disease, unspecified: Secondary | ICD-10-CM

## 2022-02-12 DIAGNOSIS — M79674 Pain in right toe(s): Secondary | ICD-10-CM

## 2022-02-12 NOTE — Telephone Encounter (Signed)
New message    Daughter Velna Hatchet at the front desk with paperwork from [employer] .   Please fax back to the office  [phone]

## 2022-02-12 NOTE — Progress Notes (Unsigned)
  Subjective:  Patient ID: Natasha Hernandez, female    DOB: 08-26-1940,  MRN: [id number]  Natasha Hernandez presents to clinic today for at risk foot care with h/o NIDDM, neuropathy and PAD. Chief Complaint  Patient presents with   Nail Problem    Thick painful toenails, 3 month follow up   . New problem(s): None.   Her daughter is present on today's visit. They would like to know status of her new diabetic insoles.  PCP is No primary care provider on file.Marland Kitchen  No Known Allergies  Review of Systems: Negative except as noted in the HPI.  Objective: No changes noted in today's physical examination.  Natasha Hernandez is a pleasant 81 y.o. female WD, WN in NAD. AAO x 3.  Neurovascular Examination: CFT immediate b/l LE. Faintly palpable DP/PT pulses b/l LE. Digital hair present b/l. Skin temperature gradient WNL b/l. No pain with calf compression b/l. No edema noted b/l. No cyanosis or clubbing noted b/l LE.  Protective sensation diminished with 10g monofilament b/l. Vibratory sensation intact b/l.  Dermatological:  Pedal skin thin, shiny and atrophic b/l LE. She has a small scab noted dorsal IPJ of left hallux. No erythema, no edema, no drainage, no fluctuance, no ischemia, no gangrene. No open wounds b/l LE. No interdigital macerations noted b/l LE. Toenails 1-5 b/l elongated, discolored, dystrophic, thickened, crumbly with subungual debris and tenderness to dorsal palpation.   Hyperkeratotic lesion(s) submet head 1 right foot and submet head 5 right foot. No erythema, no edema, no drainage, no fluctuance noted.  Musculoskeletal:  Muscle strength 5/5 to all lower extremity muscle groups bilaterally. Severe hammertoe deformity noted 1-5 bilaterally. Cavus foot type b/l. Atrophy of plantar forefoot b/l. Plantarflexed metatarsals 1st and 5th b/l.  Assessment/Plan: 1. Pain due to onychomycosis of toenails of both feet   2. Pre-ulcerative calluses   3. PAD (peripheral artery disease)  ([place])   4. Diabetic peripheral neuropathy associated with type 1 diabetes mellitus ([place])     No orders of the defined types were placed in this encounter.   -Patient's family member present. All questions/concerns addressed on today's visit. -Patient updated on status of diabetic insoles. Certifying Form from PCP/Endocrinologist is not on file. We cannot order/dispense shoes/inserts without this completed form. Patient notified to contact physician treating diabetes to obtain signed paperwork. They related understanding. -Mycotic toenails 1-5 bilaterally were debrided in length and girth with sterile nail nippers and dremel without incident. -Callus(es) submet head 1 right foot and submet head 5 right foot pared utilizing sterile scalpel blade without complication or incident. Total number debrided =2. -Patient/POA to call should there be question/concern in the interim.   Return in about 3 months (around 05/15/2022).  Natasha Hernandez, DPM

## 2022-02-22 ENCOUNTER — Other Ambulatory Visit: Payer: Self-pay | Admitting: Endocrinology

## 2022-02-22 DIAGNOSIS — E1065 Type 1 diabetes mellitus with hyperglycemia: Secondary | ICD-10-CM

## 2022-03-10 ENCOUNTER — Other Ambulatory Visit: Payer: Self-pay | Admitting: Endocrinology

## 2022-03-10 DIAGNOSIS — J40 Bronchitis, not specified as acute or chronic: Secondary | ICD-10-CM | POA: Diagnosis not present

## 2022-03-10 DIAGNOSIS — R059 Cough, unspecified: Secondary | ICD-10-CM | POA: Diagnosis not present

## 2022-03-10 DIAGNOSIS — E7800 Pure hypercholesterolemia, unspecified: Secondary | ICD-10-CM

## 2022-03-18 ENCOUNTER — Other Ambulatory Visit: Payer: Self-pay

## 2022-03-18 DIAGNOSIS — E1065 Type 1 diabetes mellitus with hyperglycemia: Secondary | ICD-10-CM

## 2022-03-18 MED ORDER — HUMALOG 100 UNIT/ML IJ SOLN: INTRAMUSCULAR | 2 refills | Status: DC

## 2022-03-30 ENCOUNTER — Ambulatory Visit: Payer: Medicare Other | Admitting: Endocrinology

## 2022-04-02 ENCOUNTER — Ambulatory Visit (INDEPENDENT_AMBULATORY_CARE_PROVIDER_SITE_OTHER): Payer: Medicare Other | Admitting: *Deleted

## 2022-04-02 DIAGNOSIS — I739 Peripheral vascular disease, unspecified: Secondary | ICD-10-CM | POA: Diagnosis not present

## 2022-04-02 DIAGNOSIS — E1042 Type 1 diabetes mellitus with diabetic polyneuropathy: Secondary | ICD-10-CM

## 2022-04-02 DIAGNOSIS — L84 Corns and callosities: Secondary | ICD-10-CM

## 2022-04-05 ENCOUNTER — Ambulatory Visit: Payer: Medicare Other | Admitting: Family Medicine

## 2022-04-13 NOTE — Progress Notes (Signed)
Patient presents today to pick up diabetic insoles.  Patient was dispensed 3 pairs of foam casted diabetic insoles. She tried on her shoes with the insoles and the fit was satisfactory.   Will follow up with Dr Eloy End next year for new order.

## 2022-04-22 ENCOUNTER — Encounter: Payer: Self-pay | Admitting: Podiatry

## 2022-05-14 ENCOUNTER — Ambulatory Visit (INDEPENDENT_AMBULATORY_CARE_PROVIDER_SITE_OTHER): Payer: Medicare Other | Admitting: Podiatry

## 2022-05-14 ENCOUNTER — Encounter: Payer: Self-pay | Admitting: Podiatry

## 2022-05-14 VITALS — BP 120/61

## 2022-05-14 DIAGNOSIS — M79674 Pain in right toe(s): Secondary | ICD-10-CM | POA: Diagnosis not present

## 2022-05-14 DIAGNOSIS — E1042 Type 1 diabetes mellitus with diabetic polyneuropathy: Secondary | ICD-10-CM | POA: Diagnosis not present

## 2022-05-14 DIAGNOSIS — M79675 Pain in left toe(s): Secondary | ICD-10-CM

## 2022-05-14 DIAGNOSIS — I739 Peripheral vascular disease, unspecified: Secondary | ICD-10-CM | POA: Diagnosis not present

## 2022-05-14 DIAGNOSIS — L84 Corns and callosities: Secondary | ICD-10-CM | POA: Diagnosis not present

## 2022-05-14 DIAGNOSIS — B351 Tinea unguium: Secondary | ICD-10-CM | POA: Diagnosis not present

## 2022-05-14 NOTE — Progress Notes (Unsigned)
  Subjective:  Patient ID: Natasha Hernandez, female    DOB: 1940-05-23,  MRN: [id number]  Natasha Hernandez presents to clinic today for {jgcomplaint:23593}  Chief Complaint  Patient presents with   Nail Problem    [facility] BS-[id number] A1C-8.? PCP-Kumar PCP VST-01/08/22   New problem(s): None.   PCP is Pcp, No.  No Known Allergies  Review of Systems: Negative except as noted in the HPI.  Objective: No changes noted in today's physical examination.  Vitals:   05/14/22 0947  BP: 120/61   [name] is a pleasant 82 y.o. female WD, WN in NAD. AAO x 3.  Neurovascular Examination: CFT immediate b/l LE. Faintly palpable DP/PT pulses b/l LE. Digital hair present b/l. Skin temperature gradient WNL b/l. No pain with calf compression b/l. No edema noted b/l. No cyanosis or clubbing noted b/l LE.  Protective sensation diminished with 10g monofilament b/l. Vibratory sensation intact b/l.  Dermatological:  Pedal skin thin, shiny and atrophic b/l LE. She has a small scab noted dorsal IPJ of left hallux. No erythema, no edema, no drainage, no fluctuance, no ischemia, no gangrene. No open wounds b/l LE. No interdigital macerations noted b/l LE. Toenails 1-5 b/l elongated, discolored, dystrophic, thickened, crumbly with subungual debris and tenderness to dorsal palpation.   Hyperkeratotic lesion(s) submet head 1 right foot and submet head 5 right foot. No erythema, no edema, no drainage, no fluctuance noted.  Musculoskeletal:  Muscle strength 5/5 to all lower extremity muscle groups bilaterally. Severe hammertoe deformity noted 1-5 bilaterally. Cavus foot type b/l. Atrophy of plantar forefoot b/l. Plantarflexed metatarsals 1st and 5th b/l. Assessment/Plan: No diagnosis found.  No orders of the defined types were placed in this encounter.   None {Jgplan:[id number]::"-Patient/POA to call should there be question/concern in the interim."}   Return in about 3 months (around  08/13/2022).  Marzetta Board, DPM

## 2022-05-21 ENCOUNTER — Other Ambulatory Visit (INDEPENDENT_AMBULATORY_CARE_PROVIDER_SITE_OTHER): Payer: Medicare Other

## 2022-05-21 DIAGNOSIS — E1065 Type 1 diabetes mellitus with hyperglycemia: Secondary | ICD-10-CM

## 2022-05-21 DIAGNOSIS — R7989 Other specified abnormal findings of blood chemistry: Secondary | ICD-10-CM | POA: Diagnosis not present

## 2022-05-21 DIAGNOSIS — E7800 Pure hypercholesterolemia, unspecified: Secondary | ICD-10-CM | POA: Diagnosis not present

## 2022-05-21 LAB — COMPREHENSIVE METABOLIC PANEL
ALT: 25 U/L (ref 0–35)
AST: 23 U/L (ref 0–37)
Albumin: 4.4 g/dL (ref 3.5–5.2)
Alkaline Phosphatase: 95 U/L (ref 39–117)
BUN: 32 mg/dL — ABNORMAL HIGH (ref 6–23)
CO2: 31 mEq/L (ref 19–32)
Calcium: 9.9 mg/dL (ref 8.4–10.5)
Chloride: 102 mEq/L (ref 96–112)
Creatinine, Ser: 0.92 mg/dL (ref 0.40–1.20)
GFR: 58.95 mL/min — ABNORMAL LOW (ref >60.00)
Glucose, Bld: 127 mg/dL — ABNORMAL HIGH (ref 70–99)
Potassium: 4.5 mEq/L (ref 3.5–5.1)
Sodium: 141 mEq/L (ref 135–145)
Total Bilirubin: 0.5 mg/dL (ref 0.2–1.2)
Total Protein: 6.9 g/dL (ref 6.0–8.3)

## 2022-05-21 LAB — TSH: TSH: 4.47 u[IU]/mL (ref 0.35–5.50)

## 2022-05-21 LAB — LIPID PANEL
Cholesterol: 149 mg/dL (ref 0–200)
HDL: 80.3 mg/dL (ref >39.00)
LDL Cholesterol: 55 mg/dL (ref 0–99)
NonHDL: 68.79
Total CHOL/HDL Ratio: 2
Triglycerides: 68 mg/dL (ref 0.0–149.0)
VLDL: 13.6 mg/dL (ref 0.0–40.0)

## 2022-05-21 LAB — HEMOGLOBIN A1C: Hgb A1c MFr Bld: 8.8 % — ABNORMAL HIGH (ref 4.6–6.5)

## 2022-05-25 ENCOUNTER — Encounter (INDEPENDENT_AMBULATORY_CARE_PROVIDER_SITE_OTHER): Payer: Medicare Other | Admitting: Ophthalmology

## 2022-05-28 ENCOUNTER — Ambulatory Visit (INDEPENDENT_AMBULATORY_CARE_PROVIDER_SITE_OTHER): Payer: Medicare Other | Admitting: Endocrinology

## 2022-05-28 ENCOUNTER — Encounter: Payer: Self-pay | Admitting: Endocrinology

## 2022-05-28 VITALS — BP 120/70 | HR 80 | Ht 64.0 in | Wt 168.0 lb

## 2022-05-28 DIAGNOSIS — E7800 Pure hypercholesterolemia, unspecified: Secondary | ICD-10-CM | POA: Diagnosis not present

## 2022-05-28 DIAGNOSIS — E1065 Type 1 diabetes mellitus with hyperglycemia: Secondary | ICD-10-CM

## 2022-05-28 DIAGNOSIS — I10 Essential (primary) hypertension: Secondary | ICD-10-CM | POA: Diagnosis not present

## 2022-05-28 NOTE — Progress Notes (Unsigned)
Patient ID: Natasha Hernandez, female   DOB: 09/28/1940, 82 y.o.   MRN: [id number]            Reason for Appointment : Follow-up for Type 1 Diabetes    History of Present Illness          Diagnosis: Type 1 diabetes mellitus, date of diagnosis: 1953          Previous history:  She thinks she has been on various insulin regimens over the years but has been on Lantus for 10-12 years along with mealtime insulin Previously followed by an endocrinologist but no records available. Her A1c has usually been around 7% reportedly and was 7.2 in 7/16   Recent history:    OMNIPOD INSULIN PUMP settings   BASAL rate midnight = 1.05, 3 AM = 0.6.  10 AM = 0.55, #1 PM = 0.70.  6 PM = 0.65 and 9 PM = 0.8 with total basal 15 units  Carbohydrate coverage 1: 10 breakfast and subsequently 1: 6 , sensitivity 1: 35 and active insulin 4 hours. Blood sugar target 150  at all times with correction threshold 180 Reverse correction on Active insulin time 2.5 hours  Current management, blood sugar patterns and problems identified:    Her A1c stays over 8% and now 8.9 compared to 9%  She has been on the OmniPod 5 pump since about 02/2021 Her daughter is generally trying to help her with diabetes management but since she is working she is not able to help her most of the time during the day  Her main difficulty recently appears to be not bolusing for lunch, likely from forgetting to bolus  As usual she is sometimes not bolusing before starting treatment blood sugars are normal Her time in the automated mode is only 58% At times she will go into the manual mode and continue a couple of days and does not try to bring it back to automated mode as she does not know how to do it herself  Again not clear if she is actually entering her blood sugar at the time of her boluses with the carbs Not always trying to make corrections for high readings Also boluses are frequently delayed till the blood sugar has gone  up significantly  Generally overnight blood sugars are also periodicxcxcally high despite being in the automated mode     Mealtimes are: Breakfast: 8.30 AM, lunch 1-2 PM, Dinner: 7.30   CONTINUOUS GLUCOSE MONITORING RECORD INTERPRETATION of her G6 Dexcom download     Summary of patterns: Blood sugars are mostly averaging over the target range of 180 except part of the night; HIGHEST blood sugars are late afternoons and late evenings.  No hypoglycemia seen  Blood sugars are inconsistent from day-to-day Overnight blood sugars are usually starting of averaging around 230 with moderate variability and generally decreasing gradually until about 6 AM with fasting average about 170  Pre-meal blood sugars are about 180-190 averaging before dinner POSTPRANDIAL readings are inconsistent with significant postprandial spikes at all meals but most frequently after lunch Blood sugars were more consistently above target between 10/1 and 10/3  Highest blood sugars can occasionally be over 350 in the late afternoon  Results statistics:  CGM use % of time   2-week average/GV 202/24  Time in range      40%  % Time Above 180 44  % Time above 250 60  % Time Below 70 0     Factors causing Hypoglycemia: Overestimating  mealtime coverage, excessive basal rate  Symptoms of hypoglycemia: Some weakness, confusion at times Treatment of hypoglycemia: Corn syrup or jellybeans/other sweets.  She will get 15 g of carbohydrate  Self-care: The diet that the patient has been following is: Carbohydrate counting  Mealtimes are: Breakfast AT 8.30 AM Dinne r: 7.30   For breakfast will have toast with cheese or egg, sometimes cereal and egg  Exercise: walking and that time will use her exercise bike        Dietician consultation: Most recent: Years ago .         CDE consultation: 04/2018   Wt Readings from Last 3 Encounters:  05/28/22 168 lb (76.2 kg)  01/20/22 165 lb 12.8 oz (75.2 kg)  06/01/21 166 lb (75.3 kg)     Lab Results  Component Value Date   HGBA1C 8.8 (H) 05/21/2022   HGBA1C 8.9 (H) 01/01/2022   HGBA1C 9.0 (H) 05/01/2021   Lab Results  Component Value Date   MICROALBUR 0.7 09/05/2020   LDLCALC 55 05/21/2022   CREATININE 0.92 05/21/2022    Lab Results  Component Value Date   MICRALBCREAT 0.7 09/05/2020   MICRALBCREAT 0.9 09/07/2019     Allergies as of 05/28/2022   No Known Allergies      Medication List        Accurate as of May 28, 2022  9:10 AM. If you have any questions, ask your nurse or doctor.          aspirin EC 81 MG tablet Take 1 tablet (81 mg total) by mouth daily.   CALCIUM MAGNESIUM PO Take 1 tablet by mouth daily.   carvedilol 6.25 MG tablet Commonly known as: COREG Take 1 tablet by mouth twice daily with meals.   CO Q 10 PO Take 1 capsule by mouth daily.   Dexcom G6 Sensor Misc 1 each by Does not apply route See admin instructions. Use one sensor once every 10 days to monitor blood sugars.   Dexcom G6 Transmitter Misc 1 each by Does not apply route See admin instructions. Use one transmitter once every 90 days to transmit blood sugars.   ezetimibe 10 MG tablet Commonly known as: ZETIA Take 1 tablet by mouth once daily.   glucose blood test strip Use Contour Next test strips as instructed to check blood sugar 4 times daily. DX:E10.65   HumaLOG 100 UNIT/ML injection Generic drug: insulin lispro USE MAX OF 40 UNITS DAILY VIA INSULIN PUMP   isosorbide mononitrate 60 MG 24 hr tablet Commonly known as: IMDUR Take 1/2 tablet by mouth every morning, and, take 1 tablet by mouth every evening.   ketorolac 10 MG tablet Commonly known as: TORADOL Take 1 tablet every 6 hours by oral route.   lisinopril 20 MG tablet Commonly known as: ZESTRIL   lisinopril 10 MG tablet Commonly known as: ZESTRIL Take 1 tablet by mouth daily.   multivitamin tablet Take 1 tablet by mouth daily.   Omnipod 5 G6 Intro (Gen 5) Kit See admin  instructions.   Omnipod 5 G6 Pod (Gen 5) Misc APPLY EVERY 3 DAYS   rosuvastatin 40 MG tablet Commonly known as: CRESTOR Take 1 tablet by mouth once daily.   tizanidine 2 MG capsule Commonly known as: ZANAFLEX Take 1 capsule 3 times a day by oral route as needed for 10 days.        Allergies: No Known Allergies  Past Medical History:  Diagnosis Date   Carotid artery occlusion  Coronary artery disease    CABG in 1996. Most recent cardiac catheterization in 2013 showed patent grafts including LIMA to LAD, SVG to OM, SVG to diagonal and SVG to PDA.   Diabetes mellitus without complication ([place])    Hyperlipidemia    Hypertension    PAD (peripheral artery disease) (HCC)     Past Surgical History:  Procedure Laterality Date   CARDIAC CATHETERIZATION     CORONARY ARTERY BYPASS GRAFT  1996   EYE SURGERY      Family History  Problem Relation Age of Onset   Breast cancer Mother 70   Stroke Mother    Emphysema Father 35   Heart attack Father     Social History:  reports that she has never smoked. She has never used smokeless tobacco. She reports that she does not drink alcohol. No history on file for drug use.      Review of Systems                  No history of hypothyroidism                                        Lab Results  Component Value Date   TSH 4.47 05/21/2022   TSH 4.90 05/01/2021   TSH 4.90 (H) 09/05/2020   FREET4 0.68 05/01/2021    Lipids:  She is on Crestor 40 mg daily and since 7/19 also on Zetia  Has history of CAD  several years ago with coronary bypass LDL is below 70 as follows   Lab Results  Component Value Date   CHOL 149 05/21/2022   HDL 80.30 05/21/2022   LDLCALC 55 05/21/2022   TRIG 68.0 05/21/2022   CHOLHDL 2 05/21/2022    Has had proliferative but stable retinopathy, has been going regularly for exams 2/23  Neuropathy with some sensory loss: Has been prescribed diabetic shoes  Has regular foot exams, followed by  podiatrist  HYPERTENSION:  This is controlled with lisinopril 10 mg daily along with low-dose Coreg Blood pressure was initially high when she first came in  BP Readings from Last 3 Encounters:  05/28/22 120/70  05/14/22 120/61  01/20/22 130/60     Lab Results  Component Value Date   CREATININE 0.92 05/21/2022   CREATININE 0.92 01/01/2022   CREATININE 0.82 05/01/2021    She refuses to have an influenza vaccine  LABS:  No visits with results within 1 Week(s) from this visit.  Latest known visit with results is:  Lab on 05/21/2022  Component Date Value Ref Range Status   TSH 05/21/2022 4.47  0.35 - 5.50 uIU/mL Final   Cholesterol 05/21/2022 149  0 - 200 mg/dL Final   ATP III Classification       Desirable:  < 200 mg/dL               Borderline High:  200 - 239 mg/dL          High:  > = 240 mg/dL   Triglycerides 05/21/2022 68.0  0.0 - 149.0 mg/dL Final   Normal:  <150 mg/dLBorderline High:  150 - 199 mg/dL   HDL 05/21/2022 80.30  >39.00 mg/dL Final   VLDL 05/21/2022 13.6  0.0 - 40.0 mg/dL Final   LDL Cholesterol 05/21/2022 55  0 - 99 mg/dL Final   Total CHOL/HDL Ratio 05/21/2022 2   Final  Men          Women1/2 Average Risk     3.4          3.3Average Risk          5.0          4.42X Average Risk          9.6          7.13X Average Risk          15.0          11.0                       NonHDL 05/21/2022 68.79   Final   NOTE:  Non-HDL goal should be 30 mg/dL higher than patient's LDL goal (i.e. LDL goal of < 70 mg/dL, would have non-HDL goal of < 100 mg/dL)   Sodium 05/21/2022 141  135 - 145 mEq/L Final   Potassium 05/21/2022 4.5  3.5 - 5.1 mEq/L Final   Chloride 05/21/2022 102  96 - 112 mEq/L Final   CO2 05/21/2022 31  19 - 32 mEq/L Final   Glucose, Bld 05/21/2022 127 (H)  70 - 99 mg/dL Final   BUN 05/21/2022 32 (H)  6 - 23 mg/dL Final   Creatinine, Ser 05/21/2022 0.92  0.40 - 1.20 mg/dL Final   Total Bilirubin 05/21/2022 0.5  0.2 - 1.2 mg/dL Final    Alkaline Phosphatase 05/21/2022 95  39 - 117 U/L Final   AST 05/21/2022 23  0 - 37 U/L Final   ALT 05/21/2022 25  0 - 35 U/L Final   Total Protein 05/21/2022 6.9  6.0 - 8.3 g/dL Final   Albumin 05/21/2022 4.4  3.5 - 5.2 g/dL Final   GFR 05/21/2022 58.95 (L)  >60.00 mL/min Final   Calculated using the CKD-EPI Creatinine Equation (2021)   Calcium 05/21/2022 9.9  8.4 - 10.5 mg/dL Final   Hgb A1c MFr Bld 05/21/2022 8.8 (H)  4.6 - 6.5 % Final   Glycemic Control Guidelines for People with Diabetes:Non Diabetic:  <6%Goal of Therapy: <7%Additional Action Suggested:  >8%      Physical Examination:  BP 120/70 (BP Location: Left Arm, Patient Position: Sitting, Cuff Size: Small)   Pulse 80   Ht 5' 4"$  (1.626 m)   Wt 168 lb (76.2 kg)   SpO2 96%   BMI 28.84 kg/m     ASSESSMENT:  Diabetes type 1, long-standing with retinopathy  A1c is 8.9 %  See history of present illness for analysis of current diabetes management, blood sugar patterns and problems identified  She is on version 5 Omnipod insulin pump with Humalog insulin and using the Dexcom for CGM  Her blood sugars were analyzed from her Dexcom download and day-to-day management was reviewed in detail from pump download  She has only 40 % of readings within target similar to her last visit Part of her hyperglycemia is related to not being in the automated mode and only 58% within that mortality She is herself not able to remember how to put her back in the automated mode and not able to get the help of her daughter all the time Boluses are also either late or insufficient and frequently missed at lunch  Since she is not very comfortable with using technology her level of control is going to be difficult Also usually is afraid of getting low sugars and does not make corrections as needed  Hypertension: Well-controlled  PLAN:  Since her blood sugars are trending higher after about 6-7 PM she can make a target 130 after 7 PM She  will take a correction bolus for blood sugars over 200 in between meals Reminded her to bolus before starting to eat since she knows that any carbohydrate will raise her blood sugar She will need to try and set up reminder to bolus before lunch daily She will try to restart the automated mode as soon as possible with the help of her daughter especially if she needs to make a correction for high readings  There are no Patient Instructions on file for this visit.    Elayne Snare 05/28/2022, 9:10 AM   Note: This note was prepared with Dragon voice recognition system technology. Any transcriptional errors that result from this process are unintentional.

## 2022-05-31 ENCOUNTER — Encounter: Payer: Self-pay | Admitting: Endocrinology

## 2022-05-31 ENCOUNTER — Other Ambulatory Visit: Payer: Self-pay | Admitting: Endocrinology

## 2022-05-31 DIAGNOSIS — E1065 Type 1 diabetes mellitus with hyperglycemia: Secondary | ICD-10-CM

## 2022-05-31 MED ORDER — HUMALOG 100 UNIT/ML IJ SOLN: INTRAMUSCULAR | 2 refills | Status: DC

## 2022-05-31 MED ORDER — OMNIPOD 5 DEXG7G6 PODS GEN 5 MISC: 3 refills | Status: DC

## 2022-05-31 NOTE — Addendum Note (Signed)
Addended by: Lauralyn Primes on: 05/31/2022 09:40 AM   Modules accepted: Orders

## 2022-06-01 ENCOUNTER — Encounter: Payer: Self-pay | Admitting: Endocrinology

## 2022-06-01 ENCOUNTER — Telehealth: Payer: Self-pay

## 2022-06-01 DIAGNOSIS — H35371 Puckering of macula, right eye: Secondary | ICD-10-CM | POA: Diagnosis not present

## 2022-06-01 DIAGNOSIS — E103553 Type 1 diabetes mellitus with stable proliferative diabetic retinopathy, bilateral: Secondary | ICD-10-CM | POA: Diagnosis not present

## 2022-06-01 LAB — HM DIABETES EYE EXAM

## 2022-06-01 MED ORDER — INSULIN LISPRO 100 UNIT/ML IJ SOLN: 40.0000 [IU] | Freq: Every day | INTRAMUSCULAR | 1 refills | Status: DC

## 2022-06-01 NOTE — Telephone Encounter (Signed)
Humalog not covered.  All Pharmacy Suggested Alternatives:  insulin lispro (ADMELOG) 100 UNIT/ML injection insulin lispro (ADMELOG SOLOSTAR) 100 UNIT/ML KwikPen insulin aspart (FIASP FLEXTOUCH) 100 UNIT/ML FlexTouch Pen Insulin Aspart, w/Niacinamide, (FIASP) 100 UNIT/ML SOLN Insulin Aspart, w/Niacinamide, (FIASP PENFILL) 100 UNIT/ML SOCT

## 2022-06-01 NOTE — Telephone Encounter (Signed)
Rx sent 

## 2022-06-03 ENCOUNTER — Other Ambulatory Visit (HOSPITAL_COMMUNITY): Payer: Self-pay

## 2022-06-03 ENCOUNTER — Telehealth: Payer: Self-pay | Admitting: Pharmacy Technician

## 2022-06-03 NOTE — Telephone Encounter (Signed)
-----   Message from Lauralyn Primes, [place] sent at 05/31/2022 10:29 AM EST ----- Regarding: PA CAN we get a PA for Onmi pods sent in. Thank you.

## 2022-06-03 NOTE — Telephone Encounter (Signed)
Pharmacy Patient Advocate Encounter  Prior Authorization for Omnipod has been approved.    PA# PA Case ID: [id number] Effective dates: 04/19/22 through 06/03/23  Spoke to the pharmacy to process. They will order it to have in tomorrow afternoon.

## 2022-06-03 NOTE — Telephone Encounter (Signed)
Pharmacy Patient Advocate Encounter   Received notification from Staff msgs/RMA that prior authorization for Omnipod 5 G6 Pods is required/requested.   PA submitted on 06/03/22 to (ins) [employer] in [place] via [employer], [place] Case ID: [id number] Status is pending

## 2022-06-08 ENCOUNTER — Other Ambulatory Visit: Payer: Self-pay | Admitting: Cardiovascular Disease

## 2022-06-08 ENCOUNTER — Other Ambulatory Visit: Payer: Self-pay | Admitting: Endocrinology

## 2022-06-08 DIAGNOSIS — E7800 Pure hypercholesterolemia, unspecified: Secondary | ICD-10-CM

## 2022-06-08 NOTE — Telephone Encounter (Signed)
Please contact pt for future appointment.  Pt due for 12 month f/u.

## 2022-06-15 ENCOUNTER — Other Ambulatory Visit: Payer: Self-pay | Admitting: *Deleted

## 2022-06-15 DIAGNOSIS — I6523 Occlusion and stenosis of bilateral carotid arteries: Secondary | ICD-10-CM

## 2022-08-06 ENCOUNTER — Ambulatory Visit: Payer: Medicare Other | Attending: Cardiology

## 2022-08-06 DIAGNOSIS — I6523 Occlusion and stenosis of bilateral carotid arteries: Secondary | ICD-10-CM | POA: Insufficient documentation

## 2022-08-12 ENCOUNTER — Other Ambulatory Visit: Payer: Self-pay | Admitting: *Deleted

## 2022-08-12 DIAGNOSIS — I6523 Occlusion and stenosis of bilateral carotid arteries: Secondary | ICD-10-CM

## 2022-08-13 ENCOUNTER — Encounter: Payer: Self-pay | Admitting: Podiatry

## 2022-08-13 ENCOUNTER — Ambulatory Visit (INDEPENDENT_AMBULATORY_CARE_PROVIDER_SITE_OTHER): Payer: Medicare Other | Admitting: Podiatry

## 2022-08-13 DIAGNOSIS — I739 Peripheral vascular disease, unspecified: Secondary | ICD-10-CM | POA: Diagnosis not present

## 2022-08-13 DIAGNOSIS — B351 Tinea unguium: Secondary | ICD-10-CM | POA: Diagnosis not present

## 2022-08-13 DIAGNOSIS — M79674 Pain in right toe(s): Secondary | ICD-10-CM

## 2022-08-13 DIAGNOSIS — M79675 Pain in left toe(s): Secondary | ICD-10-CM

## 2022-08-13 DIAGNOSIS — E1042 Type 1 diabetes mellitus with diabetic polyneuropathy: Secondary | ICD-10-CM

## 2022-08-13 DIAGNOSIS — L84 Corns and callosities: Secondary | ICD-10-CM | POA: Diagnosis not present

## 2022-08-16 NOTE — Progress Notes (Signed)
  Subjective:  Patient ID: Natasha Hernandez, female    DOB: 20-Aug-1940,  MRN: [id number]  Natasha Hernandez presents to clinic today for at risk foot care with history of diabetic neuropathy  Chief Complaint  Patient presents with   Diabetes    [facility] BS - 175 A1C - HIGH 8 LVPCP - ITS BEEN 7 YRS, ENDOCRINOLOGIST 05/28/22 NEXT APPT IS 09/2022   New problem(s): None.   She is accompanied by her daughter on today's visit.   PCP is Reather Littler, MD.  No Known Allergies  Review of Systems: Negative except as noted in the HPI.  Objective: No changes noted in today's physical examination. There were no vitals filed for this visit. Natasha Hernandez is a pleasant 82 y.o. female WD, WN in NAD. AAO x 3.  Neurovascular Examination: CFT immediate b/l LE. Faintly palpable DP/PT pulses b/l LE. Digital hair present b/l. Skin temperature gradient WNL b/l. No pain with calf compression b/l. No edema noted b/l. No cyanosis or clubbing noted b/l LE.  Protective sensation diminished with 10g monofilament b/l. Vibratory sensation intact b/l.  Dermatological:  Pedal skin thin, shiny and atrophic b/l LE.   Toenails 1-5 b/l elongated, discolored, dystrophic, thickened, crumbly with subungual debris and tenderness to dorsal palpation.   Hyperkeratotic lesion(s) submet head 5 right foot. No erythema, no edema, no drainage, no fluctuance noted.  Musculoskeletal:  Muscle strength 5/5 to all lower extremity muscle groups bilaterally. Severe hammertoe deformity noted 1-5 bilaterally. Cavus foot type b/l. Atrophy of plantar forefoot b/l. Plantarflexed metatarsals 1st and 5th b/l. Wearing Drew double depth shoes on today's visit.  Assessment/Plan: 1. Pain due to onychomycosis of toenails of both feet   2. Callus   3. PAD (peripheral artery disease) (HCC)   4. 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)   -Consent given for treatment as described below: -Examined  patient. -Toenails 1-5 b/l were debrided in length and girth with sterile nail nippers and dremel without iatrogenic bleeding.  -Callus(es) submet head 5 right foot pared utilizing sterile scalpel blade without complication or incident. Total number debrided =1. -Patient/POA to call should there be question/concern in the interim.   Return in about 3 months (around 11/12/2022).  Freddie Breech, DPM

## 2022-09-01 ENCOUNTER — Other Ambulatory Visit: Payer: Self-pay | Admitting: Cardiovascular Disease

## 2022-09-01 ENCOUNTER — Other Ambulatory Visit: Payer: Self-pay | Admitting: Endocrinology

## 2022-09-01 DIAGNOSIS — E7800 Pure hypercholesterolemia, unspecified: Secondary | ICD-10-CM

## 2022-09-01 NOTE — Telephone Encounter (Signed)
Good Morning,  Could you schedule this patient a 12 month follow up visit? The patient was last seen by Dr. Kirke Corin on 05-22-21 and he wanted her to follow up in 12 months. Thank you so much.

## 2022-09-11 ENCOUNTER — Encounter: Payer: Self-pay | Admitting: Podiatry

## 2022-09-17 ENCOUNTER — Other Ambulatory Visit (INDEPENDENT_AMBULATORY_CARE_PROVIDER_SITE_OTHER): Payer: Medicare Other

## 2022-09-17 DIAGNOSIS — E1065 Type 1 diabetes mellitus with hyperglycemia: Secondary | ICD-10-CM

## 2022-09-17 LAB — BASIC METABOLIC PANEL
BUN: 28 mg/dL — ABNORMAL HIGH (ref 6–23)
CO2: 34 mEq/L — ABNORMAL HIGH (ref 19–32)
Calcium: 10.1 mg/dL (ref 8.4–10.5)
Chloride: 101 mEq/L (ref 96–112)
Creatinine, Ser: 0.95 mg/dL (ref 0.40–1.20)
GFR: 56.59 mL/min — ABNORMAL LOW (ref >60.00)
Glucose, Bld: 123 mg/dL — ABNORMAL HIGH (ref 70–99)
Potassium: 5 mEq/L (ref 3.5–5.1)
Sodium: 140 mEq/L (ref 135–145)

## 2022-09-17 LAB — HEMOGLOBIN A1C: Hgb A1c MFr Bld: 8.7 % — ABNORMAL HIGH (ref 4.6–6.5)

## 2022-09-17 LAB — MICROALBUMIN / CREATININE URINE RATIO
Creatinine,U: 99.8 mg/dL
Microalb Creat Ratio: 1.3 mg/g (ref 0.0–30.0)
Microalb, Ur: 1.3 mg/dL (ref 0.0–1.9)

## 2022-09-20 ENCOUNTER — Other Ambulatory Visit: Payer: Self-pay | Admitting: Endocrinology

## 2022-09-20 ENCOUNTER — Telehealth (INDEPENDENT_AMBULATORY_CARE_PROVIDER_SITE_OTHER): Payer: Medicare Other | Admitting: Podiatry

## 2022-09-20 DIAGNOSIS — E7800 Pure hypercholesterolemia, unspecified: Secondary | ICD-10-CM

## 2022-09-20 NOTE — Telephone Encounter (Signed)
Phoned Natasha Hernandez's daughter regarding picture of Mom's foot. Natasha Hernandez states blister has resolved and foot is fine now. She will send picture of foot later this afternoon. She is aware of signs to look for and when to schedule urgent/same day visit should I not be available.

## 2022-09-21 MED ORDER — EZETIMIBE 10 MG PO TABS
10.0000 mg | ORAL_TABLET | Freq: Every day | ORAL | 0 refills | Status: DC
Start: 2022-09-21 — End: 2023-03-03

## 2022-09-23 NOTE — Progress Notes (Signed)
Patient ID: Natasha Hernandez, female   DOB: April 18, 1941, 82 y.o.   MRN: [id number]            Reason for Appointment : Follow-up for Type 1 Diabetes    History of Present Illness          Diagnosis: Type 1 diabetes mellitus, date of diagnosis: 1953          Previous history:  She thinks she has been on various insulin regimens over the years but has been on Lantus for 10-12 years along with mealtime insulin Previously followed by an endocrinologist but no records available. Her A1c has usually been around 7% reportedly and was 7.2 in 7/16   Recent history:    OMNIPOD 5 INSULIN PUMP settings   BASAL rate midnight = 1.05, 2:30 AM = 0.5, 6 AM = 0.6, 3 PM = 0.8, 6 PM = 0.65 and 9 PM = 0.9 with total basal 17 units  Carbohydrate coverage 1: 8 breakfast and subsequently 1: 6 , sensitivity 1: 35. Blood sugar target 12 AM = 130, 150 from 3 AM-8 AM, 130 from 8 AM-7 PM and subsequently 110 with correction threshold 150 during the day Reverse correction on Active insulin time 2.5 hours Recent TOTAL insulin per day about 25 units with 29% in boluses  Current management, blood sugar patterns and problems identified:    Her A1c about the same at 8.7  She has been on the OmniPod 5 pump since about 02/2021 Her daughter is supervising her pump management as the patient is not able to do so on her own but this is difficult because of her working during the day To help her with diabetes and pump management but since she is working she is not able to help her most of the time during the day  Her blood sugars are variable but highest late in the evening and only relatively better early morning as discussed below in the CGM She is usually eating small amount of carbohydrates but still requiring relatively significant carb ratios She is not completely compliant with bolusing at meals either delaying the bolus to the blood sugar goes up or forgetting the bolus especially if she is not sure how much  she will eat Because of her sugars going up or other issues her pump will be limited mode frequently and her daughter may forget to turn it back on the automated mode for couple of days, currently only 58% in the automated mode She does have a late dinner but not clear why her sugars are generally higher late in the evening with or without boluses, likely may be taking inadequate boluses She has gradually gained weight    Mealtimes are: Breakfast: 8.30 AM, lunch 1-2 PM, Dinner: 7.30   CONTINUOUS GLUCOSE MONITORING RECORD INTERPRETATION of her G6 Dexcom download     Summary of patterns: Blood sugars are averaging above the target range between about 5 PM and 1 AM with the lowest readings around 6 AM, mild variability present   OVERNIGHT blood sugars are variable, starting with average about 220 around midnight and generally decreasing till about 3 AM and leveling off in the mid 100 range.  No hypoglycemia Premeal blood sugars are averaging about 170 at lunch and dinner Postprandial readings: After breakfast has relatively level blood sugars with mild variability After lunch blood sugars are also evenly controlled with only sporadic high readings  Blood sugars in the evenings especially after dinner are generally rising more significantly  although may be sometimes higher before the indicated bolus time Blood sugars are fairly consistently high between 9 PM and 1 AM  No hypoglycemia  Results statistics:  CGM use % of time   2-week average/GV    174   Time in range 54 versus 42%  % Time Above 180 34  % Time above 250 12  % Time Below 70      Previous data:  CGM use % of time   2-week average/GV 197/21  Time in range 42, was 40  % Time Above 180 44  % Time above 250 14  % Time Below 70 0   Symptoms of hypoglycemia: Some weakness, confusion at times Treatment of hypoglycemia: Corn syrup or jellybeans/other sweets.  She will get 15 g of carbohydrate  Self-care: The diet that the  patient has been following is: Carbohydrate counting  Mealtimes are: Breakfast AT 8.30 AM Dinne r: 7.30   For breakfast will have toast with cheese or egg, sometimes cereal and egg  Exercise: walking and that time will use her exercise bike        Dietician consultation: Most recent: Years ago .         CDE consultation: 04/2018   Wt Readings from Last 3 Encounters:  09/24/22 171 lb 6.4 oz (77.7 kg)  05/28/22 168 lb (76.2 kg)  01/20/22 165 lb 12.8 oz (75.2 kg)    Lab Results  Component Value Date   HGBA1C 8.7 (H) 09/17/2022   HGBA1C 8.8 (H) 05/21/2022   HGBA1C 8.9 (H) 01/01/2022   Lab Results  Component Value Date   MICROALBUR 1.3 09/17/2022   LDLCALC 55 05/21/2022   CREATININE 0.95 09/17/2022    Lab Results  Component Value Date   MICRALBCREAT 1.3 09/17/2022   MICRALBCREAT 0.7 09/05/2020     Allergies as of 09/24/2022   No Known Allergies      Medication List        Accurate as of September 24, 2022 11:01 AM. If you have any questions, ask your nurse or doctor.          aspirin EC 81 MG tablet Take 1 tablet (81 mg total) by mouth daily.   CALCIUM MAGNESIUM PO Take 1 tablet by mouth daily.   carvedilol 6.25 MG tablet Commonly known as: COREG Take 1 tablet by mouth twice daily with meals.   CO Q 10 PO Take 1 capsule by mouth daily.   Dexcom G6 Sensor Misc 1 each by Does not apply route See admin instructions. Use one sensor once every 10 days to monitor blood sugars.   Dexcom G6 Transmitter Misc 1 each by Does not apply route See admin instructions. Use one transmitter once every 90 days to transmit blood sugars.   ezetimibe 10 MG tablet Commonly known as: ZETIA Take 1 tablet (10 mg total) by mouth daily.   glucose blood test strip Use Contour Next test strips as instructed to check blood sugar 4 times daily. DX:E10.65   insulin lispro 100 UNIT/ML injection Commonly known as: Admelog Inject 0.4 mLs (40 Units total) into the skin daily. Via PUMP    isosorbide mononitrate 60 MG 24 hr tablet Commonly known as: IMDUR Take 1/2 tablet by mouth every morning, and, take 1 tablet by mouth every evening.   ketorolac 10 MG tablet Commonly known as: TORADOL Take 1 tablet every 6 hours by oral route.   lisinopril 20 MG tablet Commonly known as: ZESTRIL   lisinopril 10 MG  tablet Commonly known as: ZESTRIL Take 1 tablet by mouth daily.   multivitamin tablet Take 1 tablet by mouth daily.   Omnipod 5 G6 Intro (Gen 5) Kit See admin instructions.   Omnipod 5 G6 Pods (Gen 5) Misc APPLY EVERY 3 DAYS   rosuvastatin 40 MG tablet Commonly known as: CRESTOR Take 1 tablet by mouth once daily.   tizanidine 2 MG capsule Commonly known as: ZANAFLEX Take 1 capsule 3 times a day by oral route as needed for 10 days.        Allergies: No Known Allergies  Past Medical History:  Diagnosis Date   Carotid artery occlusion    Coronary artery disease    CABG in 1996. Most recent cardiac catheterization in 2013 showed patent grafts including LIMA to LAD, SVG to OM, SVG to diagonal and SVG to PDA.   Diabetes mellitus without complication (HCC)    Hyperlipidemia    Hypertension    PAD (peripheral artery disease) (HCC)     Past Surgical History:  Procedure Laterality Date   CARDIAC CATHETERIZATION     CORONARY ARTERY BYPASS GRAFT  1996   EYE SURGERY      Family History  Problem Relation Age of Onset   Breast cancer Mother 63   Stroke Mother    Emphysema Father 30   Heart attack Father     Social History:  reports that she has never smoked. She has never used smokeless tobacco. She reports that she does not drink alcohol. No history on file for drug use.      Review of Systems                  No history of hypothyroidism                                        Lab Results  Component Value Date   TSH 4.47 05/21/2022   TSH 4.90 05/01/2021   TSH 4.90 (H) 09/05/2020   FREET4 0.68 05/01/2021    Lipids:  She is on Crestor 40  mg daily and since 7/19 also on Zetia  Has history of CAD  several years ago with coronary bypass LDL is below 70 as follows   Lab Results  Component Value Date   CHOL 149 05/21/2022   HDL 80.30 05/21/2022   LDLCALC 55 05/21/2022   TRIG 68.0 05/21/2022   CHOLHDL 2 05/21/2022    Has had proliferative but stable retinopathy, has been going regularly for exams 2/23  Neuropathy with some sensory loss: Has been prescribed diabetic shoes  Has regular foot exams, followed by podiatrist, no recent infections  HYPERTENSION:  This is controlled with lisinopril 10 mg daily along with low-dose Coreg   BP Readings from Last 3 Encounters:  09/24/22 122/74  05/28/22 120/70  05/14/22 120/61     Lab Results  Component Value Date   CREATININE 0.95 09/17/2022   CREATININE 0.92 05/21/2022   CREATININE 0.92 01/01/2022    Has refused to have influenza vaccine  LABS:  No visits with results within 1 Week(s) from this visit.  Latest known visit with results is:  Lab on 09/17/2022  Component Date Value Ref Range Status   Sodium 09/17/2022 140  135 - 145 mEq/L Final   Potassium 09/17/2022 5.0  3.5 - 5.1 mEq/L Final   Chloride 09/17/2022 101  96 - 112 mEq/L Final  CO2 09/17/2022 34 (H)  19 - 32 mEq/L Final   Glucose, Bld 09/17/2022 123 (H)  70 - 99 mg/dL Final   BUN 40/98/1191 28 (H)  6 - 23 mg/dL Final   Creatinine, Ser 09/17/2022 0.95  0.40 - 1.20 mg/dL Final   GFR 47/82/9562 56.59 (L)  >60.00 mL/min Final   Calculated using the CKD-EPI Creatinine Equation (2021)   Calcium 09/17/2022 10.1  8.4 - 10.5 mg/dL Final   Hgb Z3Y MFr Bld 09/17/2022 8.7 (H)  4.6 - 6.5 % Final   Glycemic Control Guidelines for People with Diabetes:Non Diabetic:  <6%Goal of Therapy: <7%Additional Action Suggested:  >8%    Microalb, Ur 09/17/2022 1.3  0.0 - 1.9 mg/dL Final   Creatinine,U 86/57/8469 99.8  mg/dL Final   Microalb Creat Ratio 09/17/2022 1.3  0.0 - 30.0 mg/g Final     Physical  Examination:  BP 122/74   Pulse 79   Ht 5\' 4"  (1.626 m)   Wt 171 lb 6.4 oz (77.7 kg)   SpO2 92%   BMI 29.42 kg/m     ASSESSMENT:  Diabetes type 1, long-standing with retinopathy  A1c is 8.7 and about the same   See history of present illness for analysis of current diabetes management, blood sugar patterns and problems identified  She is on version 5 Omnipod insulin pump with Humalog insulin and using the Dexcom for CGM  Her blood sugars were analyzed from her Dexcom download and day-to-day management was reviewed in detail from pump download  Reviewed her pump management, bolus timings and blood sugar patterns as well as periods of hyperglycemia as  Discussed that she is not in the automated setting much at all and needs to pay attention to this  Time in range is improving recently  Diet is fairly good with relatively low carbohydrate intake overall but still appears to be getting inadequate boluses at times especially in the evening Also has hyperglycemia from late boluses  Hypertension: Well-controlled  Urine microalbumin has been checked finally and is normal  CHOLESTEROL: Well-controlled  PLAN:    Her daughter will change the settings as follows  Sensitivity 1: 40  Hopefully patient can learn how to turn back the automated mode on her own since it is only 2 steps without waiting for her daughter She needs to make sure she starts bolusing for correction for readings over 200 which will also enable automated mode Since her time in range is relatively better than last time will not adjust her targets as yet and these appear to be optimal at the times when her blood sugars are higher at late night    Patient Instructions  Bolus all meals and sugars >200  Keep in automated mode  Sensitivity 40  Total visit time for evaluation and management and counseling = 30 minutes  Reather Littler 09/24/2022, 11:01 AM   Note: This note was prepared with Dragon voice  recognition system technology. Any transcriptional errors that result from this process are unintentional.

## 2022-09-24 ENCOUNTER — Encounter: Payer: Self-pay | Admitting: Endocrinology

## 2022-09-24 ENCOUNTER — Ambulatory Visit (INDEPENDENT_AMBULATORY_CARE_PROVIDER_SITE_OTHER): Payer: Medicare Other | Admitting: Endocrinology

## 2022-09-24 VITALS — BP 122/74 | HR 79 | Ht 64.0 in | Wt 171.4 lb

## 2022-09-24 DIAGNOSIS — E7800 Pure hypercholesterolemia, unspecified: Secondary | ICD-10-CM

## 2022-09-24 DIAGNOSIS — E1065 Type 1 diabetes mellitus with hyperglycemia: Secondary | ICD-10-CM | POA: Diagnosis not present

## 2022-09-24 DIAGNOSIS — I10 Essential (primary) hypertension: Secondary | ICD-10-CM | POA: Diagnosis not present

## 2022-09-24 NOTE — Patient Instructions (Signed)
Bolus all meals and sugars >200  Keep in automated mode  Sensitivity 40

## 2022-09-27 ENCOUNTER — Encounter: Payer: Self-pay | Admitting: Endocrinology

## 2022-10-24 ENCOUNTER — Other Ambulatory Visit: Payer: Self-pay

## 2022-10-24 ENCOUNTER — Emergency Department (HOSPITAL_COMMUNITY)
Admission: EM | Admit: 2022-10-24 | Discharge: 2022-10-24 | Disposition: A | Discharge status: Home / Self Care | Payer: Medicare Other | Attending: Emergency Medicine | Admitting: Emergency Medicine

## 2022-10-24 ENCOUNTER — Encounter (HOSPITAL_COMMUNITY): Payer: Self-pay

## 2022-10-24 ENCOUNTER — Emergency Department (HOSPITAL_COMMUNITY): Payer: Medicare Other

## 2022-10-24 DIAGNOSIS — Z1152 Encounter for screening for COVID-19: Secondary | ICD-10-CM | POA: Insufficient documentation

## 2022-10-24 DIAGNOSIS — E119 Type 2 diabetes mellitus without complications: Secondary | ICD-10-CM | POA: Insufficient documentation

## 2022-10-24 DIAGNOSIS — R112 Nausea with vomiting, unspecified: Secondary | ICD-10-CM | POA: Diagnosis not present

## 2022-10-24 DIAGNOSIS — Z7982 Long term (current) use of aspirin: Secondary | ICD-10-CM | POA: Diagnosis not present

## 2022-10-24 DIAGNOSIS — I959 Hypotension, unspecified: Secondary | ICD-10-CM | POA: Diagnosis not present

## 2022-10-24 DIAGNOSIS — R197 Diarrhea, unspecified: Secondary | ICD-10-CM | POA: Insufficient documentation

## 2022-10-24 DIAGNOSIS — I2510 Atherosclerotic heart disease of native coronary artery without angina pectoris: Secondary | ICD-10-CM | POA: Insufficient documentation

## 2022-10-24 DIAGNOSIS — I10 Essential (primary) hypertension: Secondary | ICD-10-CM | POA: Insufficient documentation

## 2022-10-24 DIAGNOSIS — R1084 Generalized abdominal pain: Secondary | ICD-10-CM | POA: Diagnosis not present

## 2022-10-24 DIAGNOSIS — N2889 Other specified disorders of kidney and ureter: Secondary | ICD-10-CM | POA: Diagnosis not present

## 2022-10-24 DIAGNOSIS — K8689 Other specified diseases of pancreas: Secondary | ICD-10-CM | POA: Diagnosis not present

## 2022-10-24 DIAGNOSIS — K449 Diaphragmatic hernia without obstruction or gangrene: Secondary | ICD-10-CM | POA: Diagnosis not present

## 2022-10-24 DIAGNOSIS — Z794 Long term (current) use of insulin: Secondary | ICD-10-CM | POA: Diagnosis not present

## 2022-10-24 DIAGNOSIS — K862 Cyst of pancreas: Secondary | ICD-10-CM | POA: Diagnosis not present

## 2022-10-24 DIAGNOSIS — Z79899 Other long term (current) drug therapy: Secondary | ICD-10-CM | POA: Insufficient documentation

## 2022-10-24 DIAGNOSIS — R42 Dizziness and giddiness: Secondary | ICD-10-CM | POA: Diagnosis not present

## 2022-10-24 DIAGNOSIS — R109 Unspecified abdominal pain: Secondary | ICD-10-CM | POA: Diagnosis present

## 2022-10-24 DIAGNOSIS — K559 Vascular disorder of intestine, unspecified: Secondary | ICD-10-CM | POA: Diagnosis not present

## 2022-10-24 LAB — CBC WITH DIFFERENTIAL/PLATELET
Abs Immature Granulocytes: 0 10*3/uL (ref 0.00–0.07)
Basophils Absolute: 0 10*3/uL (ref 0.0–0.1)
Basophils Relative: 1 %
Eosinophils Absolute: 0.1 10*3/uL (ref 0.0–0.5)
Eosinophils Relative: 3 %
HCT: 40.8 % (ref 36.0–46.0)
Hemoglobin: 13 g/dL (ref 12.0–15.0)
Immature Granulocytes: 0 %
Lymphocytes Relative: 16 %
Lymphs Abs: 0.7 10*3/uL (ref 0.7–4.0)
MCH: 31.9 pg (ref 26.0–34.0)
MCHC: 31.9 g/dL (ref 30.0–36.0)
MCV: 100 fL (ref 80.0–100.0)
Monocytes Absolute: 0.4 10*3/uL (ref 0.1–1.0)
Monocytes Relative: 9 %
Neutro Abs: 2.9 10*3/uL (ref 1.7–7.7)
Neutrophils Relative %: 71 %
Platelets: 163 10*3/uL (ref 150–400)
RBC: 4.08 MIL/uL (ref 3.87–5.11)
RDW: 13.1 % (ref 11.5–15.5)
WBC: 4 10*3/uL (ref 4.0–10.5)
nRBC: 0 % (ref 0.0–0.2)

## 2022-10-24 LAB — URINALYSIS, W/ REFLEX TO CULTURE (INFECTION SUSPECTED)
Bilirubin Urine: NEGATIVE
Glucose, UA: NEGATIVE mg/dL
Hgb urine dipstick: NEGATIVE
Ketones, ur: NEGATIVE mg/dL
Nitrite: NEGATIVE
Protein, ur: NEGATIVE mg/dL
Specific Gravity, Urine: 1.011 (ref 1.005–1.030)
pH: 6 (ref 5.0–8.0)

## 2022-10-24 LAB — COMPREHENSIVE METABOLIC PANEL
ALT: 19 U/L (ref 0–44)
AST: 20 U/L (ref 15–41)
Albumin: 3.4 g/dL — ABNORMAL LOW (ref 3.5–5.0)
Alkaline Phosphatase: 79 U/L (ref 38–126)
Anion gap: 9 (ref 5–15)
BUN: 16 mg/dL (ref 8–23)
CO2: 30 mmol/L (ref 22–32)
Calcium: 9.5 mg/dL (ref 8.9–10.3)
Chloride: 99 mmol/L (ref 98–111)
Creatinine, Ser: 0.82 mg/dL (ref 0.44–1.00)
GFR, Estimated: >60 mL/min (ref >60)
Glucose, Bld: 144 mg/dL — ABNORMAL HIGH (ref 70–99)
Potassium: 4 mmol/L (ref 3.5–5.1)
Sodium: 138 mmol/L (ref 135–145)
Total Bilirubin: 0.6 mg/dL (ref 0.3–1.2)
Total Protein: 5.8 g/dL — ABNORMAL LOW (ref 6.5–8.1)

## 2022-10-24 LAB — CBG MONITORING, ED: Glucose-Capillary: 133 mg/dL — ABNORMAL HIGH (ref 70–99)

## 2022-10-24 LAB — RESP PANEL BY RT-PCR (RSV, FLU A&B, COVID)  RVPGX2
Influenza A by PCR: NEGATIVE
Influenza B by PCR: NEGATIVE
Resp Syncytial Virus by PCR: NEGATIVE
SARS Coronavirus 2 by RT PCR: NEGATIVE

## 2022-10-24 LAB — LACTIC ACID, PLASMA: Lactic Acid, Venous: 1.1 mmol/L (ref 0.5–1.9)

## 2022-10-24 LAB — LIPASE, BLOOD: Lipase: 19 U/L (ref 11–51)

## 2022-10-24 LAB — MAGNESIUM: Magnesium: 2 mg/dL (ref 1.7–2.4)

## 2022-10-24 MED ORDER — LACTATED RINGERS IV BOLUS
500.0000 mL | Freq: Once | INTRAVENOUS | Status: AC
  Administered 2022-10-24: 500 mL via INTRAVENOUS

## 2022-10-24 MED ORDER — ONDANSETRON HCL 4 MG/2ML IJ SOLN
4.0000 mg | Freq: Once | INTRAMUSCULAR | Status: AC
  Administered 2022-10-24: 4 mg via INTRAVENOUS
  Filled 2022-10-24: qty 2

## 2022-10-24 MED ORDER — IOHEXOL 350 MG/ML SOLN
75.0000 mL | Freq: Once | INTRAVENOUS | Status: AC | PRN
  Administered 2022-10-24: 75 mL via INTRAVENOUS

## 2022-10-24 NOTE — ED Notes (Signed)
Dc instructions and scripts reviewed with pt no questions or concerns at this time.  

## 2022-10-24 NOTE — ED Provider Notes (Signed)
Care transferred to me.  Diarrhea seems to have slowed down but she was able to give a sample.  For now, given that she is doing well and able to ambulate I think is reasonable to hold off on antibiotics and see if the C. difficile and GI pathogen panel shows anything.  Otherwise, CTA unremarkable.  She was made aware of the pancreatic cyst as well as the daughter was made aware and the need for repeat imaging in a couple years.  Urine has leukocytes but no bacteria.  She does not have a specific dysuria so I think in the setting of was likely infectious diarrhea, will hold off on antibiotics though the urine has been sent for culture per protocol.  Discussed return precautions including fever, bloody diarrhea, etc.   Pricilla Loveless, MD 10/24/22 1705

## 2022-10-24 NOTE — ED Triage Notes (Addendum)
Patient arrives by EMS from home with c/o abdominal cramping and diarrhea since Monday.   Patient reports weakness and dizziness today.    Hx. Diabetes patient has glucose monitor and insulin pump.  CBG 153  No antibiotic use recently.

## 2022-10-24 NOTE — ED Provider Notes (Signed)
[place] [facility] AT [facility] Provider Note   CSN: [id number] Arrival date & time: 10/24/22  1152     History {Add pertinent medical, surgical, social history, OB history to HPI:1} Chief Complaint  Patient presents with   Abdominal Cramping   Dizziness   Diarrhea    Natasha Hernandez is a 82 y.o. female.  HPI      82yo female with history fo DM, hyperlipidemia, htn, coronary and carotid artery disease, PAD, who presents with concern for abdominal cramping, diarrhea.   Past Medical History:  Diagnosis Date   Carotid artery occlusion    Coronary artery disease    CABG in 1996. Most recent cardiac catheterization in 2013 showed patent grafts including LIMA to LAD, SVG to OM, SVG to diagonal and SVG to PDA.   Diabetes mellitus without complication (HCC)    Hyperlipidemia    Hypertension    PAD (peripheral artery disease) (HCC)      Home Medications Prior to Admission medications   Medication Sig Start Date End Date Taking? Authorizing Provider  aspirin EC 81 MG tablet Take 1 tablet (81 mg total) by mouth daily. 03/25/17   Iran Ouch, MD  Calcium-Magnesium-Vitamin D (CALCIUM MAGNESIUM PO) Take 1 tablet by mouth daily.    [provider]  carvedilol (COREG) 6.25 MG tablet Take 1 tablet by mouth twice daily with meals. 09/01/22   Iran Ouch, MD  Coenzyme Q10 (CO Q 10 PO) Take 1 capsule by mouth daily.    [provider]  Continuous Blood Gluc Sensor (DEXCOM G6 SENSOR) MISC 1 each by Does not apply route See admin instructions. Use one sensor once every 10 days to monitor blood sugars. 01/12/22   Reather Littler, MD  Continuous Blood Gluc Transmit (DEXCOM G6 TRANSMITTER) MISC 1 each by Does not apply route See admin instructions. Use one transmitter once every 90 days to transmit blood sugars.    [provider]  ezetimibe (ZETIA) 10 MG tablet Take 1 tablet (10 mg total) by mouth daily. 09/21/22   Reather Littler, MD   glucose blood test strip Use Contour Next test strips as instructed to check blood sugar 4 times daily. DX:E10.65 05/30/19   Reather Littler, MD  Insulin Disposable Pump (OMNIPOD 5 G6 INTRO, GEN 5,) KIT See admin instructions. 02/26/21   [provider]  Insulin Disposable Pump (OMNIPOD 5 G6 POD, GEN 5,) MISC APPLY EVERY 3 DAYS 05/31/22   Reather Littler, MD  insulin lispro (ADMELOG) 100 UNIT/ML injection Inject 0.4 mLs (40 Units total) into the skin daily. Via PUMP 06/01/22   Reather Littler, MD  isosorbide mononitrate (IMDUR) 60 MG 24 hr tablet Take 1/2 tablet by mouth every morning, and, take 1 tablet by mouth every evening. 09/01/22   Iran Ouch, MD  ketorolac (TORADOL) 10 MG tablet Take 1 tablet every 6 hours by oral route. 07/15/17   [provider]  lisinopril (ZESTRIL) 10 MG tablet Take 1 tablet by mouth daily. 02/08/22   Reather Littler, MD  lisinopril (ZESTRIL) 20 MG tablet     [provider]  Multiple Vitamin (MULTIVITAMIN) tablet Take 1 tablet by mouth daily.    [provider]  rosuvastatin (CRESTOR) 40 MG tablet Take 1 tablet by mouth once daily. 09/01/22   Reather Littler, MD  tizanidine (ZANAFLEX) 2 MG capsule Take 1 capsule 3 times a day by oral route as needed for 10 days. 05/31/18   [provider]  Allergies    Patient has no known allergies.    Review of Systems   Review of Systems  Physical Exam Updated Vital Signs BP (!) 117/100 (BP Location: Right Arm)   Pulse 72   Temp 98.6 F (37 C)   Resp 16   Ht 5\' 4"  (1.626 m)   Wt 78.9 kg   SpO2 99%   BMI 29.87 kg/m  Physical Exam  ED Results / Procedures / Treatments   Labs (all labs ordered are listed, but only abnormal results are displayed) Labs Reviewed - No data to display  EKG None  Radiology No results found.  Procedures Procedures  {Document cardiac monitor, telemetry assessment procedure when appropriate:1}  Medications Ordered in ED Medications - No data to  display  ED Course/ Medical Decision Making/ A&P   {   Click here for ABCD2, HEART and other calculatorsREFRESH Note before signing :1}                          Medical Decision Making  ***  {Document critical care time when appropriate:1} {Document review of labs and clinical decision tools ie heart score, Chads2Vasc2 etc:1}  {Document your independent review of radiology images, and any outside records:1} {Document your discussion with family members, caretakers, and with consultants:1} {Document social determinants of health affecting pt's care:1} {Document your decision making why or why not admission, treatments were needed:1} Final Clinical Impression(s) / ED Diagnoses Final diagnoses:  None    Rx / DC Orders ED Discharge Orders     None

## 2022-10-24 NOTE — Discharge Instructions (Addendum)
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  Your family doctor can set this up.  For now continue to take over-the-counter medicine such as Imodium and/or Pepto-Bismol to help with diarrhea.  The stool testing that we collected will come positive in MyChart and if there are significant results we will call you to discuss antibiotics.  If you develop fever, new or worsening abdominal pain, bloody diarrhea, or any other new/concerning symptoms then return to the ER or call 911.

## 2022-10-25 LAB — URINE CULTURE

## 2022-10-26 LAB — URINE CULTURE: Culture: >=100000 — AB

## 2022-10-27 ENCOUNTER — Telehealth (HOSPITAL_BASED_OUTPATIENT_CLINIC_OR_DEPARTMENT_OTHER): Payer: Self-pay

## 2022-10-27 ENCOUNTER — Telehealth: Payer: Self-pay

## 2022-10-27 NOTE — Transitions of Care (Post Inpatient/ED Visit) (Signed)
10/27/2022  Name: Natasha Hernandez MRN: [id number] DOB: 08-16-1940  Today's TOC FU Call Status: Today's TOC FU Call Status:: Successful TOC FU Call Competed TOC FU Call Complete Date: 10/27/22 (Call completed with caregiver/ daughter in law-Brandi)   Red on EMMI-ED Discharge Alert Date & Reason:10/26/22 "Scheduled follow-up appt? No"   Transition Care Management Follow-up Telephone Call Date of Discharge: 10/24/22 Discharge Facility: [facility]) Type of Discharge: Emergency Department Reason for ED Visit: Other: ("diarrhea of presumed infectious origin") How have you been since you were released from the hospital?: Better (Caregiver states pt is 'doing better-no more diarrhea at all." Appetite is WNL for pt. Blood sugars within her normal range. 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 Sig Documenting Provider Last Dose Status Informant  aspirin EC 81 MG tablet [id number] No Take 1 tablet (81 mg total) by mouth daily. [place] Ouch, MD Taking Active Self  Calcium-Magnesium-Vitamin D (CALCIUM MAGNESIUM PO) [id number] No Take 1 tablet by mouth daily. [provider] Taking Active Self  carvedilol (COREG) 6.25 MG tablet [id number] No Take 1 tablet by mouth twice  daily with meals. [place] Ouch, MD Taking Active   Coenzyme Q10 (CO Q 10 PO) [id number] No Take 1 capsule by mouth daily. [provider] Taking Active Self  Continuous Blood Gluc Sensor (DEXCOM G6 SENSOR) MISC [id number] No 1 each by Does not apply route See admin instructions. Use one sensor once every 10 days to monitor blood sugars. Reather Littler, MD Taking Active   Continuous Blood Gluc Transmit (DEXCOM G6 TRANSMITTER) MISC [id number] No 1 each by Does not apply route See admin instructions. Use one transmitter once every 90 days to transmit blood sugars. [provider] Taking Active   ezetimibe (ZETIA) 10 MG tablet [id number] No Take 1 tablet (10 mg total) by mouth daily. Reather Littler, MD Taking Active   glucose blood test strip [id number] No Use Contour Next test strips as instructed to check blood sugar 4 times daily. DX:E10.65 Reather Littler, MD Taking Active   Insulin Disposable Pump (OMNIPOD 5 G6 INTRO, GEN 5,) KIT [id number] No See admin instructions. [provider] Taking Active   Insulin Disposable Pump (OMNIPOD 5 G6 POD, GEN 5,) MISC [id number] No APPLY EVERY 3 DAYS Reather Littler, MD Taking Active   insulin lispro (ADMELOG) 100 UNIT/ML injection [id number] No Inject 0.4 mLs (40 Units total) into the skin daily. Via PUMP Reather Littler, MD Taking Active   isosorbide mononitrate (IMDUR) 60 MG 24 hr tablet [id number] No Take 1/2 tablet by mouth every morning, and, take 1 tablet by mouth every evening. [place] Ouch, MD Taking Active   ketorolac (TORADOL) 10 MG tablet [id number] No Take 1 tablet every 6 hours by oral route. [provider] Taking Active   lisinopril (ZESTRIL) 10 MG tablet [id number] No Take 1 tablet by mouth daily. Lucianne Muss,  Viviano Simas, MD Taking Active   lisinopril (ZESTRIL) 20 MG tablet [id number] No  [provider] Taking Active   Multiple Vitamin (MULTIVITAMIN) tablet [id number] No Take 1 tablet by mouth daily. [provider] Taking  Active Self  rosuvastatin (CRESTOR) 40 MG tablet [id number] No Take 1 tablet by mouth once daily. Reather Littler, MD Taking Active   tizanidine (ZANAFLEX) 2 MG capsule [id number] No Take 1 capsule 3 times a day by oral route as needed for 10 days. [provider] Taking Active             Home Care and Equipment/Supplies: Were Home Health Services Ordered?: NA Any new equipment or medical supplies ordered?: NA  Functional Questionnaire: Do you need assistance with bathing/showering or dressing?: Yes Do you need assistance with meal preparation?: Yes Do you need assistance with eating?: No Do you have difficulty maintaining continence: No Do you need assistance with getting out of bed/getting out of a chair/moving?: Yes  Follow up appointments reviewed: PCP Follow-up appointment confirmed?: No (Caregiver voices that pt not followed by primary care physcian-only ses DM MD. She will consider and look into getting PCP.) MD Provider Line Number:[phone] Given: Yes Specialist Hospital Follow-up appointment confirmed?: No Reason Specialist Follow-Up Not Confirmed: Patient has Specialist Provider Number and will Call for Appointment (Caregiver has contact info and will call to make follow up appt) Do you need transportation to your follow-up appointment?: No Do you understand care options if your condition(s) worsen?: Yes-patient verbalized understanding  SDOH Interventions Today    Flowsheet Row Most Recent Value  SDOH Interventions   Food Insecurity Interventions Intervention Not Indicated  Transportation Interventions Intervention Not Indicated      TOC Interventions Today    Flowsheet Row Most Recent Value  TOC Interventions   TOC Interventions Discussed/Reviewed TOC Interventions Discussed, S/S of infection  [UTI s/s reviewed]      Interventions Today    Flowsheet Row Most Recent Value  Chronic Disease   Chronic disease during today's visit Diabetes  General  Interventions   General Interventions Discussed/Reviewed General Interventions Discussed, Doctor Visits  Doctor Visits Discussed/Reviewed Doctor Visits Discussed, PCP, Specialist  PCP/Specialist Visits Compliance with follow-up visit  Education Interventions   Education Provided Provided Education  Provided Verbal Education On Nutrition, Medication, When to see the doctor, Blood Sugar Monitoring  Nutrition Interventions   Nutrition Discussed/Reviewed Nutrition Discussed  Pharmacy Interventions   Pharmacy Dicussed/Reviewed Pharmacy Topics Discussed  Safety Interventions   Safety Discussed/Reviewed Safety Discussed       Alessandra Grout [facility] Care Management Care Management Community Coordinator Direct Phone: [phone] Toll Free: [phone] Fax: [phone]

## 2022-10-27 NOTE — Telephone Encounter (Signed)
Post ED Visit - Positive Culture Follow-up  Culture report reviewed by antimicrobial stewardship pharmacist: [facility] [x]  Ivery Quale, [place].D. []  Celedonio Miyamoto, Pharm.D., BCPS AQ-ID []  Garvin Fila, Pharm.D., BCPS []  Georgina Pillion, Pharm.D., BCPS []  [place], [place].D., BCPS, AAHIVP []  Estella Husk, Pharm.D., BCPS, AAHIVP []  Lysle Pearl, PharmD, BCPS []  Phillips Climes, PharmD, BCPS []  Agapito Games, PharmD, BCPS []  Verlan Friends, PharmD []  Mervyn Gay, PharmD, BCPS []  Vinnie Level, PharmD  [facility] []  Len Childs, PharmD []  Greer Pickerel, PharmD []  Adalberto Cole, PharmD []  Perlie Gold, Rph []  Lonell Face) Jean Rosenthal, PharmD []  Earl Many, PharmD []  Junita Push, PharmD []  Dorna Leitz, PharmD []  Terrilee Files, PharmD []  Lynann Beaver, PharmD []  Keturah Barre, PharmD []  Loralee Pacas, PharmD []  Bernadene Person, PharmD   Positive urine culture Not treated, no urinary symptoms, no antibiotics at discharge, no further intervention at this time. No further patient follow-up is required at this time.  Sandria Senter 10/27/2022, 8:53 AM

## 2022-11-05 ENCOUNTER — Other Ambulatory Visit: Payer: Self-pay | Admitting: Endocrinology

## 2022-11-12 ENCOUNTER — Ambulatory Visit: Payer: Medicare Other | Admitting: Podiatry

## 2022-11-12 VITALS — BP 156/61

## 2022-11-12 DIAGNOSIS — B351 Tinea unguium: Secondary | ICD-10-CM

## 2022-11-12 DIAGNOSIS — L84 Corns and callosities: Secondary | ICD-10-CM

## 2022-11-12 DIAGNOSIS — I739 Peripheral vascular disease, unspecified: Secondary | ICD-10-CM | POA: Diagnosis not present

## 2022-11-12 DIAGNOSIS — M79674 Pain in right toe(s): Secondary | ICD-10-CM | POA: Diagnosis not present

## 2022-11-12 DIAGNOSIS — E1042 Type 1 diabetes mellitus with diabetic polyneuropathy: Secondary | ICD-10-CM | POA: Diagnosis not present

## 2022-11-12 DIAGNOSIS — M79675 Pain in left toe(s): Secondary | ICD-10-CM | POA: Diagnosis not present

## 2022-11-19 NOTE — Telephone Encounter (Signed)
Patient is scheduled for 8/30 .

## 2022-11-20 ENCOUNTER — Encounter: Payer: Self-pay | Admitting: Podiatry

## 2022-11-20 NOTE — Progress Notes (Signed)
  Subjective:  Patient ID: Natasha Hernandez, female    DOB: [birth year],  MRN: [id number]  Natasha Hernandez presents to clinic today for callus(es) right foot and painful thick toenails that are difficult to trim. Painful toenails interfere with ambulation. Aggravating factors include wearing enclosed shoe gear. Pain is relieved with periodic professional debridement. Painful calluses are aggravated when weightbearing with and without shoegear. Pain is relieved with periodic professional debridement.  Chief Complaint  Patient presents with   Nail Problem    DFC,PCPs Type   Reather Littler, MD,lov: 06/24,A1C:8.7     New problem(s): None.   PCP is Reather Littler, MD.  No Known Allergies  Review of Systems: Negative except as noted in the HPI.  Objective: Vitals:   11/12/22 0920  BP: (!) 156/61   Natasha Hernandez is a pleasant 82 y.o. female WD, WN in NAD. AAO x 3.  Neurovascular Examination: CFT immediate b/l LE. Faintly palpable DP/PT pulses b/l LE. Digital hair present b/l. Skin temperature gradient WNL b/l. No pain with calf compression b/l. No edema noted b/l. No cyanosis or clubbing noted b/l LE.  Protective sensation diminished with 10g monofilament b/l. Vibratory sensation intact b/l.  Dermatological:  Pedal skin thin, shiny and atrophic b/l LE.   Toenails 1-5 b/l elongated, discolored, dystrophic, thickened, crumbly with subungual debris and tenderness to dorsal palpation.   Hyperkeratotic lesion(s) submet head 5 right foot. No erythema, no edema, no drainage, no fluctuance noted.  Musculoskeletal:  Muscle strength 5/5 to all lower extremity muscle groups bilaterally. Severe hammertoe deformity noted 1-5 bilaterally. Cavus foot type b/l. Atrophy of plantar forefoot b/l. Plantarflexed metatarsals 1st and 5th b/l. Wearing Drew double depth shoes on today's visit.  Assessment/Plan: 1. Pain due to onychomycosis of toenails of both feet   2. Callus   3. PAD (peripheral  artery disease) (HCC)   4. 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)    -Patient was evaluated and treated. All patient's and/or POA's questions/concerns answered on today's visit. -Patient to continue soft, supportive shoe gear daily. -Toenails 1-5 b/l were debrided in length and girth with sterile nail nippers and dremel without iatrogenic bleeding.  -Callus(es) submet head 5 right foot pared utilizing sterile scalpel blade without complication or incident. Total number debrided =1. -Patient/POA to call should there be question/concern in the interim.   Return in about 3 months (around 02/12/2023).  Freddie Breech, DPM

## 2022-12-03 ENCOUNTER — Other Ambulatory Visit: Payer: Self-pay | Admitting: Endocrinology

## 2022-12-03 DIAGNOSIS — E1065 Type 1 diabetes mellitus with hyperglycemia: Secondary | ICD-10-CM

## 2022-12-04 ENCOUNTER — Other Ambulatory Visit: Payer: Self-pay | Admitting: Cardiovascular Disease

## 2022-12-04 ENCOUNTER — Other Ambulatory Visit: Payer: Self-pay | Admitting: Endocrinology

## 2022-12-17 ENCOUNTER — Ambulatory Visit: Payer: Medicare Other | Attending: Cardiovascular Disease | Admitting: Cardiovascular Disease

## 2022-12-17 ENCOUNTER — Encounter: Payer: Self-pay | Admitting: Cardiovascular Disease

## 2022-12-17 VITALS — BP 110/72 | HR 72 | Wt 168.8 lb

## 2022-12-17 DIAGNOSIS — E785 Hyperlipidemia, unspecified: Secondary | ICD-10-CM | POA: Diagnosis not present

## 2022-12-17 DIAGNOSIS — I6523 Occlusion and stenosis of bilateral carotid arteries: Secondary | ICD-10-CM

## 2022-12-17 DIAGNOSIS — I2510 Atherosclerotic heart disease of native coronary artery without angina pectoris: Secondary | ICD-10-CM | POA: Diagnosis not present

## 2022-12-17 DIAGNOSIS — I739 Peripheral vascular disease, unspecified: Secondary | ICD-10-CM

## 2022-12-17 DIAGNOSIS — I10 Essential (primary) hypertension: Secondary | ICD-10-CM

## 2022-12-17 MED ORDER — ISOSORBIDE MONONITRATE ER 30 MG PO TB24: 30.0000 mg | ORAL_TABLET | Freq: Two times a day (BID) | ORAL | 11 refills | Status: DC

## 2022-12-17 NOTE — Patient Instructions (Addendum)
Medication Instructions:  IMDUR: Take one 30 mg tablet twice daily  *If you need a refill on your cardiac medications before your next appointment, please call your pharmacy*   Lab Work: None ordered If you have labs (blood work) drawn today and your tests are completely normal, you will receive your results only by: MyChart Message (if you have MyChart) OR A paper copy in the mail If you have any lab test that is abnormal or we need to change your treatment, we will call you to review the results.   Testing/Procedures: None ordered   Follow-Up: At [facility], you and your health needs are our priority.  As part of our continuing mission to provide you with exceptional heart care, we have created designated Provider Care Teams.  These Care Teams include your primary Cardiologist (physician) and Advanced Practice Providers (APPs -  Physician Assistants and Nurse Practitioners) who all work together to provide you with the care you need, when you need it.  We recommend signing up for the patient portal called "MyChart".  Sign up information is provided on this After Visit Summary.  MyChart is used to connect with patients for Virtual Visits (Telemedicine).  Patients are able to view lab/test results, encounter notes, upcoming appointments, etc.  Non-urgent messages can be sent to your provider as well.   To learn more about what you can do with MyChart, go to [email or web address].    Your next appointment:   12 month(s)  Provider:   You may see Lorine Bears, MD or one of the following Advanced Practice Providers on your designated Care Team:   Nicolasa Ducking, NP Eula Listen, PA-C Cadence Fransico Michael, PA-C Charlsie Quest, NP

## 2022-12-17 NOTE — Progress Notes (Signed)
Cardiology Office Note   Date:  12/17/2022   ID:  Natasha Hernandez, DOB 24-Jan-1941, MRN [id number]  PCP:  Reather Littler, MD  Cardiologist:  Kirke Corin  Chief Complaint  Patient presents with   Follow-up    Patient has new 4-5 episodes of dizziness with last episode last week.        History of Present Illness: Natasha Hernandez is a 82 y.o. female who is here today for a follow-up visit regarding coronary artery disease and peripheral arterial disease. She has extensive medical problems that include coronary artery disease status post CABG in 1996 at [facility], type 1 diabetes since she was 82 years old, hypertension, hyperlipidemia, peripheral arterial disease and carotid artery disease. Most recent cardiac catheterization in 2013 showed patent grafts including SVG to OM, SVG to diagonal, SVG to PDA and LIMA to LAD. She is known to have occluded right carotid artery . She is a lifelong nonsmoker. She is known to have peripheral arterial disease with no significant claudication. Noninvasive vascular evaluation in August 2017 showed an ABI of 0.85 on the right and 1.1 on the left. Duplex showed no obstructive aortoiliac or SFA disease. There was one-vessel runoff below the knee bilaterally.  Most recent Lexiscan Myoview in January 2020 showed no evidence of ischemia with normal ejection fraction.  She has not been seen in our office in more than 1 year.  She has been doing reasonably well though with no recent chest pain or shortness of breath.  She does complain of increased dizziness but no syncope.  Past Medical History:  Diagnosis Date   Carotid artery occlusion    Coronary artery disease    CABG in 1996. Most recent cardiac catheterization in 2013 showed patent grafts including LIMA to LAD, SVG to OM, SVG to diagonal and SVG to PDA.   Diabetes mellitus without complication (HCC)    Hyperlipidemia    Hypertension    PAD (peripheral artery disease) (HCC)     Past  Surgical History:  Procedure Laterality Date   CARDIAC CATHETERIZATION     CORONARY ARTERY BYPASS GRAFT  1996   EYE SURGERY       Current Outpatient Medications  Medication Sig Dispense Refill   ADMELOG 100 UNIT/ML injection INJECT 40 UNITS INTO THE SKIN DAILY. VIA PUMP 40 mL 1   aspirin EC 81 MG tablet Take 1 tablet (81 mg total) by mouth daily. 90 tablet 3   Calcium-Magnesium-Vitamin D (CALCIUM MAGNESIUM PO) Take 1 tablet by mouth daily.     carvedilol (COREG) 6.25 MG tablet Take 1 tablet (6.25 mg total) by mouth 2 (two) times daily with a meal. must keep 12/17/22 appointment for further refills. 180 tablet 0   Coenzyme Q10 (CO Q 10 PO) Take 1 capsule by mouth daily.     Continuous Blood Gluc Sensor (DEXCOM G6 SENSOR) MISC 1 each by Does not apply route See admin instructions. Use one sensor once every 10 days to monitor blood sugars. 3 each 0   Continuous Blood Gluc Transmit (DEXCOM G6 TRANSMITTER) MISC 1 each by Does not apply route See admin instructions. Use one transmitter once every 90 days to transmit blood sugars.     ezetimibe (ZETIA) 10 MG tablet Take 1 tablet (10 mg total) by mouth daily. 90 tablet 0   glucose blood test strip Use Contour Next test strips as instructed to check blood sugar 4 times daily. DX:E10.65 150 each 1   Insulin Disposable Pump (  OMNIPOD 5 G6 INTRO, GEN 5,) KIT See admin instructions.     Insulin Disposable Pump (OMNIPOD 5 G6 POD, GEN 5,) MISC APPLY EVERY 3 DAYS 30 each 3   isosorbide mononitrate (IMDUR) 60 MG 24 hr tablet Take 1/2 tablet by mouth every morning, and, take 1 tablet by mouth every evening/must keep 12/17/22 appointment for further refills. 135 tablet 0   lisinopril (ZESTRIL) 10 MG tablet Take 1 tablet by mouth daily. 90 tablet 1   Multiple Vitamin (MULTIVITAMIN) tablet Take 1 tablet by mouth daily.     rosuvastatin (CRESTOR) 40 MG tablet Take 1 tablet by mouth once daily. 90 tablet 0   ketorolac (TORADOL) 10 MG tablet Take 1 tablet every 6  hours by oral route. (Patient not taking: Reported on 11/12/2022)     tizanidine (ZANAFLEX) 2 MG capsule Take 1 capsule 3 times a day by oral route as needed for 10 days. (Patient not taking: Reported on 11/12/2022)     No current facility-administered medications for this visit.    Allergies:   Patient has no known allergies.    Social History:  The patient  reports that she has never smoked. She has never used smokeless tobacco. She reports that she does not drink alcohol.   Family History:  The patient's Family history is negative for coronary artery disease.   ROS:  Please see the history of present illness.   Otherwise, review of systems are positive for none.   All other systems are reviewed and negative.    PHYSICAL EXAM: VS:  BP 110/72 (BP Location: Left Arm, Patient Position: Sitting, Cuff Size: Normal)   Pulse 72   Wt 168 lb 12.8 oz (76.6 kg)   SpO2 98%   BMI 28.97 kg/m  , BMI Body mass index is 28.97 kg/m. GEN: Well nourished, well developed, in no acute distress  HEENT: normal  Neck: no JVD, carotid bruits, or masses Cardiac: RRR; no murmurs, rubs, or gallops,no edema  Respiratory:  clear to auscultation bilaterally, normal work of breathing GI: soft, nontender, nondistended, + BS MS: no deformity or atrophy  Skin: warm and dry, no rash Neuro:  Strength and sensation are intact Psych: euthymic mood, full affect She does have palpable distal pulses.  EKG:  EKG is ordered today. EKG showed : Sinus rhythm with Premature atrial complexes with Abberant conduction Inferior infarct , age undetermined Motion artifact.      Recent Labs: 05/21/2022: TSH 4.47 10/24/2022: ALT 19; BUN 16; Creatinine, Ser 0.82; Hemoglobin 13.0; Magnesium 2.0; Platelets 163; Potassium 4.0; Sodium 138    Lipid Panel    Component Value Date/Time   CHOL 149 05/21/2022 0912   TRIG 68.0 05/21/2022 0912   HDL 80.30 05/21/2022 0912   CHOLHDL 2 05/21/2022 0912   VLDL 13.6 05/21/2022 0912    LDLCALC 55 05/21/2022 0912      Wt Readings from Last 3 Encounters:  12/17/22 168 lb 12.8 oz (76.6 kg)  10/24/22 174 lb (78.9 kg)  09/24/22 171 lb 6.4 oz (77.7 kg)           No data to display            ASSESSMENT AND PLAN:  1.  Coronary artery disease involving native coronary arteries without angina: She is doing well overall with no anginal symptoms.  Her blood pressure is on the low side and thus I elected to decrease Imdur to 30 mg twice daily.  2. Peripheral arterial disease: Currently with no significant  claudication and no evidence of critical limb ischemia. Continue medical therapy.  Leg cramps at night do not seem to be due to peripheral arterial disease as they improve with walking.  3. 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  Most recent carotid Doppler in April of this year showed occluded right ICA with no significant disease on the left.  Repeat study in April 2025.  4. Hyperlipidemia: Continue treatment with rosuvastatin and Zetia.  Most recent lipid profile in February showed an LDL of 55.  Her renal function is stable.  5. Essential hypertension: Blood pressure is on the low side which might be causing some dizziness especially in the setting of chronically occluded right carotid artery.  I decreased Imdur as outlined above.    Disposition:   FU with me in 12 months  Signed,  Lorine Bears, MD  12/17/2022 9:33 AM    [facility]

## 2022-12-24 ENCOUNTER — Other Ambulatory Visit (INDEPENDENT_AMBULATORY_CARE_PROVIDER_SITE_OTHER): Payer: Medicare Other

## 2022-12-24 DIAGNOSIS — E1065 Type 1 diabetes mellitus with hyperglycemia: Secondary | ICD-10-CM

## 2022-12-24 DIAGNOSIS — E7800 Pure hypercholesterolemia, unspecified: Secondary | ICD-10-CM | POA: Diagnosis not present

## 2022-12-24 LAB — LIPID PANEL
Cholesterol: 126 mg/dL (ref 0–200)
HDL: 75.7 mg/dL (ref >39.00)
LDL Cholesterol: 36 mg/dL (ref 0–99)
NonHDL: 50.07
Total CHOL/HDL Ratio: 2
Triglycerides: 70 mg/dL (ref 0.0–149.0)
VLDL: 14 mg/dL (ref 0.0–40.0)

## 2022-12-24 LAB — BASIC METABOLIC PANEL
BUN: 22 mg/dL (ref 6–23)
CO2: 33 meq/L — ABNORMAL HIGH (ref 19–32)
Calcium: 10 mg/dL (ref 8.4–10.5)
Chloride: 103 meq/L (ref 96–112)
Creatinine, Ser: 0.94 mg/dL (ref 0.40–1.20)
GFR: 57.21 mL/min — ABNORMAL LOW (ref >60.00)
Glucose, Bld: 169 mg/dL — ABNORMAL HIGH (ref 70–99)
Potassium: 4.1 meq/L (ref 3.5–5.1)
Sodium: 142 meq/L (ref 135–145)

## 2022-12-24 LAB — HEMOGLOBIN A1C: Hgb A1c MFr Bld: 9.3 % — ABNORMAL HIGH (ref 4.6–6.5)

## 2023-01-07 ENCOUNTER — Encounter: Payer: Self-pay | Admitting: Endocrinology

## 2023-01-07 ENCOUNTER — Ambulatory Visit (INDEPENDENT_AMBULATORY_CARE_PROVIDER_SITE_OTHER): Payer: Medicare Other | Admitting: Endocrinology

## 2023-01-07 VITALS — BP 138/78 | HR 74 | Resp 18 | Ht 64.0 in | Wt 169.0 lb

## 2023-01-07 DIAGNOSIS — E103599 Type 1 diabetes mellitus with proliferative diabetic retinopathy without macular edema, unspecified eye: Secondary | ICD-10-CM

## 2023-01-07 DIAGNOSIS — E1065 Type 1 diabetes mellitus with hyperglycemia: Secondary | ICD-10-CM | POA: Diagnosis not present

## 2023-01-07 DIAGNOSIS — E1042 Type 1 diabetes mellitus with diabetic polyneuropathy: Secondary | ICD-10-CM | POA: Diagnosis not present

## 2023-01-07 MED ORDER — GVOKE HYPOPEN 1-PACK 1 MG/0.2ML ~~LOC~~ SOAJ: 1.0000 mg | SUBCUTANEOUS | 2 refills | Status: AC | PRN

## 2023-01-07 NOTE — Progress Notes (Signed)
Outpatient Endocrinology Note [place] Verble Styron, MD  01/07/23  Patient's Name: Natasha Hernandez    DOB: 11/21/1940    MRN: [id number]                                                    REASON OF VISIT: Follow up for type 1 diabetes mellitus  PCP: Reather Littler, MD (Inactive)  HISTORY OF PRESENT ILLNESS:   Natasha Hernandez is a 82 y.o. old female with past medical history listed below, is here for follow up for type 1 diabetes mellitus.   Pertinent Diabetes History: Patient was diagnosed with type 1 diabetes mellitus in 1953.  Patient had been on various insulin regimen over the years.  At this time she has been on OmniPod 5 insulin pump with Dexcom G6 CGM since November 2022.  Patient's daughter-in-law is supervising for managing the insulin pump at home.  Chronic Diabetes Complications : Retinopathy: yes.  Had proliferative but a stable retinopathy, regularly following with ophthalmology. Nephropathy: CKD, on lisinopril Peripheral neuropathy: yes, followed up with podiatry. Coronary artery disease: yes Stroke: no  Relevant comorbidities and cardiovascular risk factors: Obesity: no Body mass index is 29.01 kg/m.  Hypertension: yes Hyperlipidemia. Yes, on statin.   Current / Home Diabetic regimen includes:  OmniPod 5 insulin pump with Dexcom G6 CGM  Insulin Pump setting:  Basal MN- 1.05u/hour ( total daily 16.8 units) 2:30AM-0.5  6AM- 0.6 3PM- 0.8 6PM-   0.65 9PM-   0.9  Bolus CHO Ratio (1unit:CHO) MN- 1:8 11AM- 1:6  Correction/Sensitivity: MN- 1:35  Target: 120-150    Active insulin time: 2.5 hours  Prior diabetic medications:   CONTINUOUS GLUCOSE MONITORING SYSTEM (CGMS) / INSULIN PUMP INTERPRETATION:                         OmniPod 5 Pump & Sensor Download (Reviewed and summarized below.) Dates: September 7 to January 07, 2023 last 14 days  Glucose Management Indicator: 7.9% Sensor Average: 186 SD 48 CGM/Sensor usage:94% Time in range  :45%  Glycemic Trends:  <54: 0% 54-70: 0% 71-180: 45% 181-250: 43% 251-400: 12%   Average daily carbs entered: 63g Average total daily insulin:  29.7 units, Basal: 62%, Bolus: 38%.  Automated mode in Use: 75%  Trends: Postprandial hyperglycemia related with no mealtime bolus, inadequate bolus versus late bolus.  She is mostly high at bedtime extended into the overnight, patient trying to keep high blood sugar overnight to prevent hypoglycemia.  Mostly all the morning blood sugar are acceptable, there has been suspension of the basal rates mostly in the early morning.  Variable blood sugar related with the mealtime bolus some of the time blood sugar trending down after mealtime bolus close to low normal range.  No significant hypoglycemia.  Hypoglycemia: Patient has no hypoglycemic episodes. Patient has hypoglycemia awareness.    Factors modifying glucose control: 1.  Diabetic diet assessment: Usually 3 meals a day and sometimes bedtime snack.  2.  Staying active or exercising: Walking.  3.  Medication compliance: compliant all of the time.  Interval history 01/07/23 Pump and CGM data as reviewed above.  Mostly postprandial hyperglycemia related with known meal bolus, inadequate meal bolus versus late meal bolus.  Sometime, pump is changed to manual mode and forget to resume automatic mode and is  staying on manual mode for longer time.  No other complaints today.  Patient is accompanied by her daughter-in-law in the clinic today.  Recent hemoglobin A1c 9.3% worsening.  Recent labs reviewed with stable renal function and acceptable cholesterol level.  REVIEW OF SYSTEMS As per history of present illness.   PAST MEDICAL HISTORY: Past Medical History:  Diagnosis Date   Carotid artery occlusion    Coronary artery disease    CABG in 1996. Most recent cardiac catheterization in 2013 showed patent grafts including LIMA to LAD, SVG to OM, SVG to diagonal and SVG to PDA.   Diabetes  mellitus without complication (HCC)    Hyperlipidemia    Hypertension    PAD (peripheral artery disease) (HCC)     PAST SURGICAL HISTORY: Past Surgical History:  Procedure Laterality Date   CARDIAC CATHETERIZATION     CORONARY ARTERY BYPASS GRAFT  1996   EYE SURGERY      ALLERGIES: No Known Allergies  FAMILY HISTORY:  Family History  Problem Relation Age of Onset   Breast cancer Mother 55   Stroke Mother    Emphysema Father 74   Heart attack Father     SOCIAL HISTORY: Social History   Socioeconomic History   Marital status: Widowed    Spouse name: Not on file   Number of children: Not on file   Years of education: Not on file   Highest education level: Not on file  Occupational History   Occupation: Retired  Tobacco Use   Smoking status: Never   Smokeless tobacco: Never  Vaping Use   Vaping status: Never Used  Substance and Sexual Activity   Alcohol use: No   Drug use: Not on file   Sexual activity: Not on file  Other Topics Concern   Not on file  Social History Narrative   Pt lives w/ son, dtr-in-law and grandchildren.    Social Determinants of Health   Financial Resource Strain: Not on file  Food Insecurity: No Food Insecurity (10/27/2022)   Hunger Vital Sign    Worried About Running Out of Food in the Last Year: Never true    Ran Out of Food in the Last Year: Never true  Transportation Needs: No Transportation Needs (10/27/2022)   PRAPARE - Administrator, Civil Service (Medical): No    Lack of Transportation (Non-Medical): No  Physical Activity: Not on file  Stress: Not on file  Social Connections: Not on file    MEDICATIONS:  Current Outpatient Medications  Medication Sig Dispense Refill   ADMELOG 100 UNIT/ML injection INJECT 40 UNITS INTO THE SKIN DAILY. VIA PUMP 40 mL 1   aspirin EC 81 MG tablet Take 1 tablet (81 mg total) by mouth daily. 90 tablet 3   Calcium-Magnesium-Vitamin D (CALCIUM MAGNESIUM PO) Take 1 tablet by mouth  daily.     carvedilol (COREG) 6.25 MG tablet Take 1 tablet (6.25 mg total) by mouth 2 (two) times daily with a meal. must keep 12/17/22 appointment for further refills. 180 tablet 0   Coenzyme Q10 (CO Q 10 PO) Take 1 capsule by mouth daily.     Continuous Blood Gluc Sensor (DEXCOM G6 SENSOR) MISC 1 each by Does not apply route See admin instructions. Use one sensor once every 10 days to monitor blood sugars. 3 each 0   Continuous Blood Gluc Transmit (DEXCOM G6 TRANSMITTER) MISC 1 each by Does not apply route See admin instructions. Use one transmitter once every 90 days to  transmit blood sugars.     ezetimibe (ZETIA) 10 MG tablet Take 1 tablet (10 mg total) by mouth daily. 90 tablet 0   Glucagon (GVOKE HYPOPEN 1-PACK) 1 MG/0.2ML SOAJ Inject 1 mg into the skin as needed (low blood sugar with impaired consciousness). 0.4 mL 2   glucose blood test strip Use Contour Next test strips as instructed to check blood sugar 4 times daily. DX:E10.65 150 each 1   Insulin Disposable Pump (OMNIPOD 5 G6 INTRO, GEN 5,) KIT See admin instructions.     Insulin Disposable Pump (OMNIPOD 5 G6 POD, GEN 5,) MISC APPLY EVERY 3 DAYS 30 each 3   isosorbide mononitrate (IMDUR) 30 MG 24 hr tablet Take 1 tablet (30 mg total) by mouth 2 (two) times daily. 60 tablet 11   ketorolac (TORADOL) 10 MG tablet      lisinopril (ZESTRIL) 10 MG tablet Take 1 tablet by mouth daily. 90 tablet 1   Multiple Vitamin (MULTIVITAMIN) tablet Take 1 tablet by mouth daily.     rosuvastatin (CRESTOR) 40 MG tablet Take 1 tablet by mouth once daily. 90 tablet 0   tizanidine (ZANAFLEX) 2 MG capsule      No current facility-administered medications for this visit.    PHYSICAL EXAM: Vitals:   01/07/23 0959 01/07/23 1001  BP: (!) 150/60 138/78  Pulse: 74   Resp: 18   SpO2: 99%   Weight: 169 lb (76.7 kg)   Height: 5\' 4"  (1.626 m)    Body mass index is 29.01 kg/m.  Wt Readings from Last 3 Encounters:  01/07/23 169 lb (76.7 kg)  12/17/22 168 lb  12.8 oz (76.6 kg)  10/24/22 174 lb (78.9 kg)    General: Well developed, well nourished female in no apparent distress.  HEENT: AT/[place], no external lesions.  Eyes: Conjunctiva clear and no icterus. Neck: Neck supple  Lungs: Respirations not labored Neurologic: Alert, oriented, normal speech Extremities / Skin: Dry. No sores or rashes noted. No acanthosis nigricans Psychiatric: Does not appear depressed or anxious  Diabetic Foot Exam - Simple   No data filed     LABS Reviewed Lab Results  Component Value Date   HGBA1C 9.3 (H) 12/24/2022   HGBA1C 8.7 (H) 09/17/2022   HGBA1C 8.8 (H) 05/21/2022   No results found for: "FRUCTOSAMINE" Lab Results  Component Value Date   CHOL 126 12/24/2022   HDL 75.70 12/24/2022   LDLCALC 36 12/24/2022   TRIG 70.0 12/24/2022   CHOLHDL 2 12/24/2022   Lab Results  Component Value Date   MICRALBCREAT 1.3 09/17/2022   MICRALBCREAT 0.7 09/05/2020   Lab Results  Component Value Date   CREATININE 0.94 12/24/2022   Lab Results  Component Value Date   GFR 57.21 (L) 12/24/2022    ASSESSMENT / PLAN  1. Uncontrolled type 1 diabetes mellitus with hyperglycemia (HCC)   2. 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)   3. 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 - Diabetic status / severity: Uncontrolled.  Lab Results  Component Value Date   HGBA1C 9.3 (H) 12/24/2022    - Hemoglobin A1c goal <7.5%   Patient has uncontrolled type 1 diabetes mellitus.  She has mostly hyperglycemia related with meals postprandially with inadequate mealtime bolus insulin via pump.  Advised to use mealtime bolus 10 to 15 minutes before meals, for all the meals.  Use automatic mode as much as possible.  I have decreased the basal rates for  nighttime and increased for suppertime.  Relaxed the carb ratio as follows to allow patient's confidence to use mealtime bolus for all meals.  - Medications:  OmniPod 5 insulin pump with Dexcom G6 CGM  Insulin Pump setting:  Basal MN- 1.05u/hour ( total daily 16.8 units), changed to 0.6. 2:30AM-0.5  6AM- 0.6 3PM- 0.8 6PM-   0.65, changed to 0.85 9PM-   0.9, changed to 0.7  Bolus CHO Ratio (1unit:CHO) MN- 1:8, changed to 1:10 11AM- 1:6, changed to 1:10  Correction/Sensitivity: MN- 1:35  Target: 120-150    Active insulin time: 2.5 hours   - Home glucose testing: continue CGM and check blood glucose as needed.  - Discussed/ Gave Hypoglycemia treatment plan.  Sent prescription for Glucagon Emergency Kit.  # Consult : not required at this time.   # Annual urine for microalbuminuria/ creatinine ratio, no microalbuminuria currently, continue ACE/ARB /lisinopril. Last  Lab Results  Component Value Date   MICRALBCREAT 1.3 09/17/2022    # Foot check nightly / neuropathy.  # Has diabetic retinopathy, regular follow-up with ophthalmology.  - Diet: Make healthy diabetic food choices - Life style / activity / exercise: Discussed.  2. Blood pressure  -  BP Readings from Last 1 Encounters:  01/07/23 138/78    - Control is in target.  - No change in current plans.  3. Lipid status / Hyperlipidemia - Last  Lab Results  Component Value Date   LDLCALC 36 12/24/2022   - Continue rosuvastatin 40 mg daily.  Zetia 10 mg daily.  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    DISPOSITION Follow up in clinic in 3 months suggested.   All questions answered and patient verbalized understanding of the plan.  [place] Tibor Lemmons, MD [facility] Endocrinology [facility] [address] [place], [address] [place], [place] [address] Phone # [phone]  At least part of this note was generated using voice recognition software. Inadvertent word errors may have occurred, which were not recognized during the proofreading process.

## 2023-01-07 NOTE — Patient Instructions (Signed)
Changed setting on pump today.  Used meal bolus for all meals.  Basal rate for night decreased.

## 2023-01-25 ENCOUNTER — Encounter: Payer: Self-pay | Admitting: Podiatry

## 2023-01-25 ENCOUNTER — Encounter: Payer: Self-pay | Admitting: Endocrinology

## 2023-01-25 ENCOUNTER — Ambulatory Visit (INDEPENDENT_AMBULATORY_CARE_PROVIDER_SITE_OTHER): Payer: Medicare Other | Admitting: Podiatry

## 2023-01-25 VITALS — BP 156/64 | HR 83

## 2023-01-25 DIAGNOSIS — L97522 Non-pressure chronic ulcer of other part of left foot with fat layer exposed: Secondary | ICD-10-CM

## 2023-01-25 DIAGNOSIS — E0843 Diabetes mellitus due to underlying condition with diabetic autonomic (poly)neuropathy: Secondary | ICD-10-CM | POA: Diagnosis not present

## 2023-01-25 MED ORDER — GENTAMICIN SULFATE 0.1 % EX CREA: 1.0000 | TOPICAL_CREAM | Freq: Two times a day (BID) | CUTANEOUS | 1 refills | Status: DC

## 2023-01-25 MED ORDER — DOXYCYCLINE HYCLATE 100 MG PO TABS
100.0000 mg | ORAL_TABLET | Freq: Two times a day (BID) | ORAL | 0 refills | Status: DC
Start: 2023-01-25 — End: 2023-02-18

## 2023-01-25 NOTE — Progress Notes (Signed)
Chief Complaint  Patient presents with   Diabetes    "She had a blister on her toe.  It's really red." N - blister L - hallux left D - Middle of last week O - suddenly C - blister, red A - standing T - Neosporin, band-aid    Subjective:  82 y.o. female with PMHx of diabetes mellitus presenting today as a new patient for evaluation of an ulcer that developed to the left great toe.  Patient's daughter states that about a week ago they noticed a blister develop overlying the great toe.  Likely caused by her diabetic shoes.  They have been monitoring the wound over the past week and they present today for further treatment and evaluation   Past Medical History:  Diagnosis Date   Carotid artery occlusion    Coronary artery disease    CABG in 1996. Most recent cardiac catheterization in 2013 showed patent grafts including LIMA to LAD, SVG to OM, SVG to diagonal and SVG to PDA.   Diabetes mellitus without complication (HCC)    Hyperlipidemia    Hypertension    PAD (peripheral artery disease) (HCC)     Past Surgical History:  Procedure Laterality Date   CARDIAC CATHETERIZATION     CORONARY ARTERY BYPASS GRAFT  1996   EYE SURGERY      No Known Allergies   LT great toe 01/25/2023   Objective/Physical Exam General: The patient is alert and oriented x3 in no acute distress.  Dermatology:  Wound #1 noted to the left great toe measuring approximately 1.0 x 1.0 x 0.1 cm (LxWxD).   To the noted ulceration(s), there is no eschar. There is a moderate amount of slough, fibrin, and necrotic tissue noted. Granulation tissue and wound base is red. There is a minimal amount of serosanguineous drainage noted. There is no exposed bone muscle-tendon ligament or joint. There is no malodor. Periwound integrity is intact. Skin is warm, dry and supple bilateral lower extremities.  Vascular: Palpable pedal pulses bilaterally. No edema.  There does appear to be some slight erythema around the  great toe joint.  Capillary refill is immediate  Neurological: Light touch and protective threshold diminished bilaterally.   Musculoskeletal Exam: Patient ambulatory.  Hammertoe deformity noted to the lesser digits and the great toe.  No prior amputations  Assessment: 1.  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 diabetes mellitus w/ peripheral neuropathy   Plan of Care:  -Patient was evaluated. -Medically necessary excisional debridement including subcutaneous tissue was performed using a tissue nipper and a chisel blade. Excisional debridement of all the necrotic nonviable tissue down to healthy bleeding viable tissue was performed with post-debridement measurements same as pre-. -Silvadene cream and a light dressing applied -Prescription for doxycycline 100 mg 2 times daily #20 -Prescription for gentamicin cream apply twice daily with a light Band-Aid -Postsurgical shoe dispensed.  WBAT.  I do not want the patient to wear any closed toed shoe that would potentially rub or irritate the IPJ of the great toe -Return to clinic in 2 weeks  Felecia Shelling, DPM [facility]  Dr. Felecia Shelling, DPM    [address], [place] [address]  Office [phone]  Fax [phone]

## 2023-02-08 ENCOUNTER — Encounter: Payer: Self-pay | Admitting: Podiatry

## 2023-02-08 ENCOUNTER — Ambulatory Visit (INDEPENDENT_AMBULATORY_CARE_PROVIDER_SITE_OTHER): Payer: Medicare Other | Admitting: Podiatry

## 2023-02-08 DIAGNOSIS — E0843 Diabetes mellitus due to underlying condition with diabetic autonomic (poly)neuropathy: Secondary | ICD-10-CM

## 2023-02-08 DIAGNOSIS — L97522 Non-pressure chronic ulcer of other part of left foot with fat layer exposed: Secondary | ICD-10-CM | POA: Diagnosis not present

## 2023-02-08 NOTE — Progress Notes (Signed)
   Chief Complaint  Patient presents with   Foot Ulcer    Follow up ulcer left hallux   "Seems like its doing better"    Subjective:  82 y.o. female with PMHx of diabetes mellitus presenting today for follow-up evaluation of an ulcer to the left great toe.  They have been applying the gentamicin cream as instructed and wearing the surgical shoe.   Past Medical History:  Diagnosis Date   Carotid artery occlusion    Coronary artery disease    CABG in 1996. Most recent cardiac catheterization in 2013 showed patent grafts including LIMA to LAD, SVG to OM, SVG to diagonal and SVG to PDA.   Diabetes mellitus without complication (HCC)    Hyperlipidemia    Hypertension    PAD (peripheral artery disease) (HCC)     Past Surgical History:  Procedure Laterality Date   CARDIAC CATHETERIZATION     CORONARY ARTERY BYPASS GRAFT  1996   EYE SURGERY      No Known Allergies   LT great toe 01/25/2023   LT great toe 02/08/2023   Objective/Physical Exam General: The patient is alert and oriented x3 in no acute distress.  Dermatology:  Significant improvement.  Wound #1 noted to the left great toe measuring approximately 0.5 x 0.5 x 0.1 cm (LxWxD).   To the noted ulceration(s), there is no eschar. There is a moderate amount of slough, fibrin, and necrotic tissue noted. Granulation tissue and wound base is red. There is a minimal amount of serosanguineous drainage noted. There is no exposed bone muscle-tendon ligament or joint. There is no malodor. Periwound integrity is intact. Skin is warm, dry and supple bilateral lower extremities.  Vascular: Palpable pedal pulses bilaterally. No edema.  There does appear to be some slight erythema around the great toe joint.  Capillary refill is immediate  Neurological: Light touch and protective threshold diminished bilaterally.   Musculoskeletal Exam: Patient ambulatory.  Hammertoe deformity noted to the lesser digits and the great toe.  No prior  amputations  Assessment: 1.  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 diabetes mellitus w/ peripheral neuropathy   Plan of Care:  -Patient was evaluated.  Overall significant improvement of the ulcer -Medically necessary excisional debridement including subcutaneous tissue was performed using a tissue nipper and a chisel blade. Excisional debridement of all the necrotic nonviable tissue down to healthy bleeding viable tissue was performed with post-debridement measurements same as pre-. -Silvadene cream and a light dressing applied - Patient completed the doxycycline 100 mg -Prescription for gentamicin cream apply twice daily with a light Band-Aid - Continue WBAT surgical shoe -Patient has a scheduled appointment with Dr. Donzetta Matters for routine footcare 02/18/2023.  Follow-up with APRN  Felecia Shelling, DPM [facility]  Dr. Felecia Shelling, DPM    [address] [place], [place] [address]                Office [phone]  Fax [phone]

## 2023-02-18 ENCOUNTER — Encounter: Payer: Self-pay | Admitting: Podiatry

## 2023-02-18 ENCOUNTER — Ambulatory Visit (INDEPENDENT_AMBULATORY_CARE_PROVIDER_SITE_OTHER): Payer: Medicare Other | Admitting: Podiatry

## 2023-02-18 VITALS — Ht 64.0 in | Wt 169.0 lb

## 2023-02-18 DIAGNOSIS — B351 Tinea unguium: Secondary | ICD-10-CM | POA: Diagnosis not present

## 2023-02-18 DIAGNOSIS — E0843 Diabetes mellitus due to underlying condition with diabetic autonomic (poly)neuropathy: Secondary | ICD-10-CM

## 2023-02-18 DIAGNOSIS — I739 Peripheral vascular disease, unspecified: Secondary | ICD-10-CM | POA: Diagnosis not present

## 2023-02-18 DIAGNOSIS — L97522 Non-pressure chronic ulcer of other part of left foot with fat layer exposed: Secondary | ICD-10-CM

## 2023-02-18 DIAGNOSIS — M79674 Pain in right toe(s): Secondary | ICD-10-CM

## 2023-02-18 DIAGNOSIS — M79675 Pain in left toe(s): Secondary | ICD-10-CM | POA: Diagnosis not present

## 2023-02-18 MED ORDER — DOXYCYCLINE HYCLATE 100 MG PO CAPS
100.0000 mg | ORAL_CAPSULE | Freq: Two times a day (BID) | ORAL | 0 refills | Status: AC
Start: 2023-02-18 — End: 2023-02-28

## 2023-02-18 NOTE — Patient Instructions (Signed)
[employer] Shoes: Choose from their KNIT COLLECTION ONLY  Go back to using [employer] shoe until she sees Dr. Logan Bores.  Continue wound care per Dr. Logan Bores' orders. Follow up with Dr. Logan Bores in one week.

## 2023-02-21 NOTE — Progress Notes (Signed)
Subjective:at risk foot care with history of diabetic neuropathy Chief Complaint  Patient presents with   Diabetes    Patient is here for routine foot care Last A1C 9.3 (12/24/2022)     Past Medical History:  Diagnosis Date   Carotid artery occlusion    Coronary artery disease    CABG in 1996. Most recent cardiac catheterization in 2013 showed patent grafts including LIMA to LAD, SVG to OM, SVG to diagonal and SVG to PDA.   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 determined by examination associated with type 2 diabetes mellitus (HCC) 08/13/2019   Stable treated proliferative diabetic retinopathy of right eye with macular edema determined by examination associated with type 1 diabetes mellitus (HCC) 08/13/2019   Stable treated proliferative diabetic retinopathy of left eye with macular edema determined by examination associated with type 1 diabetes mellitus (HCC) 08/13/2019   Pneumonia due to COVID-19 virus 03/27/2019   Onychomycosis of multiple toenails with type 2 diabetes mellitus and peripheral neuropathy (HCC) 09/29/2018   Pre-ulcerative calluses 09/29/2018   Type 1 diabetes mellitus (HCC) 06/02/2018   Degeneration of lumbar intervertebral disc 05/31/2018   Scoliosis deformity of spine 05/31/2018   Chest pain, rule out acute myocardial infarction 04/16/2018   AKI (acute kidney injury) (HCC) 04/16/2018   CAD (coronary artery disease) 09/27/2016   Hx of CABG 09/27/2016   Right carotid artery occlusion 09/27/2016   PAD (peripheral artery disease) (HCC) 09/27/2016   Essential hypertension 07/10/2016   Uncontrolled type 1 diabetes mellitus with hyperglycemia  (HCC) 05/07/2016   Pure hypercholesterolemia 05/07/2016   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) 05/07/2016   Pain in finger of right hand 04/13/2016   Stiffness of finger joint of right hand 04/13/2016   Stiffness of right wrist joint 04/13/2016   Closed fracture of right distal radius 04/08/2016     Past Surgical History:  Procedure Laterality Date   CARDIAC CATHETERIZATION     CORONARY ARTERY BYPASS GRAFT  1996   EYE SURGERY        Current Outpatient Medications:    ADMELOG 100 UNIT/ML injection, INJECT 40 UNITS INTO THE SKIN DAILY. VIA PUMP, Disp: 40 mL, Rfl: 1   aspirin EC 81 MG tablet, Take 1 tablet (81 mg total) by mouth daily., Disp: 90 tablet, Rfl: 3   Calcium-Magnesium-Vitamin D (CALCIUM MAGNESIUM PO), Take 1 tablet by mouth daily., Disp: , Rfl:    carvedilol (COREG) 6.25 MG tablet, Take 1 tablet (6.25 mg total) by mouth 2 (two) times daily with a meal. must keep 12/17/22 appointment for further refills., Disp: 180 tablet, Rfl: 0   Coenzyme Q10 (CO Q 10 PO), Take 1 capsule by mouth daily., Disp: , Rfl:    Continuous Blood Gluc Sensor (DEXCOM G6 SENSOR) MISC, 1 each by Does not apply route See admin instructions. Use one sensor once every 10 days to monitor blood sugars., Disp: 3 each, Rfl: 0   Continuous Blood Gluc Transmit (DEXCOM G6 TRANSMITTER) MISC, 1 each by Does not apply route See admin instructions. Use one transmitter once every 90 days to transmit blood sugars., Disp: , Rfl:    doxycycline (VIBRAMYCIN)  100 MG capsule, Take 1 capsule (100 mg total) by mouth 2 (two) times daily for 10 days., Disp: 20 capsule, Rfl: 0   ezetimibe (ZETIA) 10 MG tablet, Take 1 tablet (10 mg total) by mouth daily., Disp: 90 tablet, Rfl: 0   gentamicin cream (GARAMYCIN) 0.1 %, Apply 1 Application topically 2 (two) times daily., Disp: 30 g, Rfl: 1   Glucagon (GVOKE HYPOPEN 1-PACK) 1 MG/0.2ML SOAJ, Inject 1 mg into the skin as needed (low blood sugar with  impaired consciousness)., Disp: 0.4 mL, Rfl: 2   glucose blood test strip, Use Contour Next test strips as instructed to check blood sugar 4 times daily. DX:E10.65, Disp: 150 each, Rfl: 1   Insulin Disposable Pump (OMNIPOD 5 G6 INTRO, GEN 5,) KIT, See admin instructions., Disp: , Rfl:    Insulin Disposable Pump (OMNIPOD 5 G6 POD, GEN 5,) MISC, APPLY EVERY 3 DAYS, Disp: 30 each, Rfl: 3   isosorbide mononitrate (IMDUR) 30 MG 24 hr tablet, Take 1 tablet (30 mg total) by mouth 2 (two) times daily., Disp: 60 tablet, Rfl: 11   lisinopril (ZESTRIL) 10 MG tablet, Take 1 tablet by mouth daily., Disp: 90 tablet, Rfl: 1   Multiple Vitamin (MULTIVITAMIN) tablet, Take 1 tablet by mouth daily., Disp: , Rfl:    rosuvastatin (CRESTOR) 40 MG tablet, Take 1 tablet by mouth once daily., Disp: 90 tablet, Rfl: 0   No Known Allergies   Social History   Occupational History   Occupation: Retired  Tobacco Use   Smoking status: Never   Smokeless tobacco: Never  Vaping Use   Vaping status: Never Used  Substance and Sexual Activity   Alcohol use: No   Drug use: Never   Sexual activity: Not on file     Family History  Problem Relation Age of Onset   Breast cancer Mother 8   Stroke Mother    Emphysema Father 61   Heart attack Father      Immunization History  Administered Date(s) Administered   Fluad Quad(high Dose 65+) 05/18/2019   Influenza, High Dose Seasonal PF 03/20/2018, 02/22/2020   Pneumococcal Conjugate-13 05/07/2016   Pneumococcal-Unspecified 04/19/2016     Review of systems: Positive Findings in bold print.  Constitutional:  chills, fatigue, fever, sweats, weight change Communication: Nurse, learning disability, sign Presenter, broadcasting, hand writing, iPad/Android device Head: head injury Eyes: changes in vision, eye pain, glaucoma, cataracts, macular degeneration, diplopia, glare, light sensitivity, eyeglasses or contacts, blindness Ears nose mouth throat: hearing impaired, hearing aids,  ringing in  ears, deaf, sign language,  vertigo, nosebleeds,  rhinitis,  cold sores, snoring, swollen glands Cardiovascular: HTN, edema, arrhythmia, pacemaker in place, defibrillator in place, chest pain/tightness, chronic anticoagulation, blood clot, heart failure, MI Peripheral Vascular: leg cramps, varicose veins, blood clots, lymphedema, varicosities Respiratory:  asthma, difficulty breathing, denies congestion, SOB, wheezing, cough, emphysema, oxygen dependent Gastrointestinal: change in appetite or weight, abdominal pain, constipation, diarrhea, nausea, vomiting, vomiting blood, change in bowel habits, abdominal pain, jaundice, rectal bleeding, hemorrhoids, GERD Genitourinary:  nocturia,  pain on urination, polyuria,  blood in urine, Foley catheter, urinary urgency, ESRD on hemodialysis Musculoskeletal: amputation(s), cramping, stiff joints, painful joints, decreased joint motion, fractures, OA, gout, hemiplegia, paraplegia, uses cane, wheelchair bound, uses walker, uses rollator Skin: +changes in toenails, color change, dryness, itching, mole changes,  rash, wound(s) Neurological: headaches, numbness in feet, paresthesias in feet, burning in feet, fainting,  seizures, change in speech. denies headaches, memory problems/poor historian, cerebral palsy, weakness, paralysis, CVA, TIA, tremors Endocrine: diabetes,  hypothyroidism, hyperthyroidism,  goiter, dry mouth, flushing, heat intolerance,  cold intolerance,  excessive thirst, denies polyuria,  nocturia Hematological:  easy bleeding, excessive bleeding, easy bruising, enlarged lymph nodes, on long term blood thinner, history of past transusions Allergy/immunological:  hives, eczema, frequent infections, multiple drug allergies, seasonal allergies, transplant recipient, multiple food allergies Psychiatric:  anxiety, depression, mood disorder, suicidal ideations, hallucinations, insomnia  Objective: There were no vitals filed for this visit. Natasha Hernandez is a/an 82 y.o. female WD, WN in NAD. AAO x 3.   Vascular Examination: CFT <3 seconds b/l LE. Faintly palpable pedal pulses b/l. Pedal hair absent. No pain with calf compression b/l. No edema noted b/l LE. No ischemia or gangrene noted b/l LE. No cyanosis or clubbing noted b/l LE.  Dermatological Examination: Pedal skin thin, shiny and atrophic b/l LE. No interdigital macerations noted b/l LE. Toenails 1-5 b/l elongated, discolored, dystrophic, thickened, crumbly with subungual debris and tenderness to dorsal palpation.      Wound Location: dorsal IPJ of right great toe There is a minimal amount of devitalized tissue present in the wound. Wound Measurement: 0.3 x 0.2 x 0.1 cm. Wound Base: fibrotic base Peri-wound: Normal Exudate: None: wound tissue dry Sign(s) of clinical bacterial infection: no clinical signs of infection noted on examination today.  Fluid filled blister noted at medial IPJ of right hallux. No surrounding erythema, no edema, no drainage.  Musculoskeletal: Muscle strength 5/5 to all lower extremity muscle groups bilaterally. Severe hammertoe deformity noted 1-5 bilaterally.  Neurological: Protective sensation diminished with 10g monofilament b/l.     Latest Ref Rng & Units 12/24/2022    9:49 AM 09/17/2022    9:54 AM 05/21/2022    9:12 AM  Hemoglobin A1C  Hemoglobin-A1c 4.6 - 6.5 % 9.3  8.7  8.8    Assessment: 1. Pain due to onychomycosis of toenails of both feet   2. Ulcer of great toe, left, with fat layer exposed (HCC)   3. PAD (peripheral artery disease) (HCC)   4. Diabetes mellitus due to underlying condition with diabetic autonomic neuropathy, unspecified whether long term insulin use (HCC)    Plan: -Patient was evaluated and treated and all questions answered.  -Patient/POA/Family member educated on diagnosis and treatment plan of routine ulcer debridement/wound care.  -Ulceration cleansed with alcohol. TAO and light dressing applied. Blister not  disturbed. -Pedorthist modified insole to debulk at forefoot to allow more room for digits. -Recommended Knit Collection [employer] Shoes. -Rx for Doxycyline 100 mg, #20, to be taken one capsule twice daily for 10 days.. -Wear [place]co shoe until appt with Dr. Logan Bores next week. --Toenails 1-5 b/l were debrided in length and girth with sterile nail nippers and dremel without iatrogenic bleeding.  -Patient/POA to call should there be question/concern in the interim.  Return in about 1 week (around 02/25/2023). Freddie Breech, DPM

## 2023-02-28 ENCOUNTER — Other Ambulatory Visit: Payer: Self-pay | Admitting: Cardiovascular Disease

## 2023-02-28 DIAGNOSIS — H18421 Band keratopathy, right eye: Secondary | ICD-10-CM | POA: Diagnosis not present

## 2023-02-28 DIAGNOSIS — E103553 Type 1 diabetes mellitus with stable proliferative diabetic retinopathy, bilateral: Secondary | ICD-10-CM | POA: Diagnosis not present

## 2023-02-28 DIAGNOSIS — H35373 Puckering of macula, bilateral: Secondary | ICD-10-CM | POA: Diagnosis not present

## 2023-02-28 LAB — HM DIABETES EYE EXAM

## 2023-03-01 ENCOUNTER — Ambulatory Visit: Payer: Medicare Other | Admitting: Podiatry

## 2023-03-01 ENCOUNTER — Encounter: Payer: Self-pay | Admitting: Endocrinology

## 2023-03-03 ENCOUNTER — Other Ambulatory Visit: Payer: Self-pay

## 2023-03-03 DIAGNOSIS — E7800 Pure hypercholesterolemia, unspecified: Secondary | ICD-10-CM

## 2023-03-03 MED ORDER — ROSUVASTATIN CALCIUM 40 MG PO TABS
40.0000 mg | ORAL_TABLET | Freq: Every day | ORAL | 0 refills | Status: DC
Start: 2023-03-03 — End: 2023-05-30

## 2023-03-03 MED ORDER — EZETIMIBE 10 MG PO TABS
10.0000 mg | ORAL_TABLET | Freq: Every day | ORAL | 0 refills | Status: DC
Start: 2023-03-03 — End: 2023-05-30

## 2023-03-14 ENCOUNTER — Encounter: Payer: Self-pay | Admitting: Cardiovascular Disease

## 2023-03-23 ENCOUNTER — Encounter: Payer: Self-pay | Admitting: Cardiovascular Disease

## 2023-04-18 ENCOUNTER — Encounter: Payer: Self-pay | Admitting: Endocrinology

## 2023-04-18 ENCOUNTER — Ambulatory Visit (INDEPENDENT_AMBULATORY_CARE_PROVIDER_SITE_OTHER): Payer: Medicare Other | Admitting: Endocrinology

## 2023-04-18 VITALS — BP 130/68 | HR 78 | Resp 20 | Ht 64.0 in | Wt 166.0 lb

## 2023-04-18 DIAGNOSIS — E1065 Type 1 diabetes mellitus with hyperglycemia: Secondary | ICD-10-CM

## 2023-04-18 LAB — POCT GLYCOSYLATED HEMOGLOBIN (HGB A1C): Hemoglobin A1C: 9.8 % — AB (ref 4.0–5.6)

## 2023-04-18 NOTE — Progress Notes (Signed)
Outpatient Endocrinology Note Natasha Min Tunnell, MD  04/18/23  Patient's Name: Natasha Hernandez    DOB: [birth year]    MRN: [id number]                                                    REASON OF VISIT: Follow up for type 1 diabetes mellitus  PCP: Reather Littler, MD (Inactive)  HISTORY OF PRESENT ILLNESS:   Natasha Hernandez is a 82 y.o. old female with past medical history listed below, is here for follow up for type 1 diabetes mellitus.   Pertinent Diabetes History: Patient was diagnosed with type 1 diabetes mellitus in 1953.  Patient had been on various insulin regimen over the years.  At this time she has been on OmniPod 5 insulin pump with Dexcom G6 CGM since November 2022.  Patient's daughter-in-law is supervising for managing the insulin pump at home.  Chronic Diabetes Complications : Retinopathy: yes.  Has proliferative but a stable retinopathy, regularly following with ophthalmology. Nephropathy: CKD, on lisinopril Peripheral neuropathy: yes, followed up with podiatry. Coronary artery disease: yes Stroke: no  Relevant comorbidities and cardiovascular risk factors: Obesity: no Body mass index is 28.49 kg/m.  Hypertension: yes Hyperlipidemia. Yes, on statin.   Current / Home Diabetic regimen includes:  OmniPod 5 insulin pump with Dexcom G6 CGM, using Admelog U100  Insulin Pump setting:  Basal MN- 0.6u/hour  2:30AM-0.5  6AM- 0.6 3PM- 0.8 6PM-   0.85 9PM-   0.7  Bolus CHO Ratio (1unit:CHO) MN- 1:10 11AM- 1:10 3PM-    1:10  Correction/Sensitivity: MN- 1:35  Target: 120-150    Active insulin time: 2.5 hours  Prior diabetic medications:   CONTINUOUS GLUCOSE MONITORING SYSTEM (CGMS) / INSULIN PUMP INTERPRETATION:                         OmniPod 5 Pump & Sensor Download (Reviewed and summarized below.) Dates: December 17 to December 30 , 2024 last 14 days  Glucose Management Indicator: 9.1%  CGM/Sensor usage:93%   Average total daily insulin:  27.7  units, Basal: 59%, Bolus: 41%.  Automated mode in Use: 38%    Trends:  Mostly hyperglycemia with blood sugar 250-300 range,  when on manual and limited automatic mode. .  Occasional hyperglycemia after blood sugar in 400, related with meals and inadequate and no meal boluses.  She is related on less automatic mode 38%.  No hypoglycemia.  Hypoglycemia: Patient has no hypoglycemic episodes. Patient has hypoglycemia awareness.    Factors modifying glucose control: 1.  Diabetic diet assessment: Usually 3 meals a day and sometimes bedtime snack.  2.  Staying active or exercising: Walking.  3.  Medication compliance: compliant all of the time.  Interval history  Pump and CGM data as reviewed above.  Mostly hyperglycemia persistent especially when on manual or limited automatic mode.  Patient usually exits out of automatic mode due to connectivity and alarms with sensor, and forgets to turn on automatic mode. Patient is accompanied by her daughter-in-law in the clinic today.  No other complaints today.  REVIEW OF SYSTEMS As per history of present illness.   PAST MEDICAL HISTORY: Past Medical History:  Diagnosis Date   Carotid artery occlusion    Coronary artery disease    CABG in 1996. Most recent  cardiac catheterization in 2013 showed patent grafts including LIMA to LAD, SVG to OM, SVG to diagonal and SVG to PDA.   Diabetes mellitus without complication (HCC)    Hyperlipidemia    Hypertension    PAD (peripheral artery disease) (HCC)     PAST SURGICAL HISTORY: Past Surgical History:  Procedure Laterality Date   CARDIAC CATHETERIZATION     CORONARY ARTERY BYPASS GRAFT  1996   EYE SURGERY      ALLERGIES: No Known Allergies  FAMILY HISTORY:  Family History  Problem Relation Age of Onset   Breast cancer Mother 28   Stroke Mother    Emphysema Father 72   Heart attack Father     SOCIAL HISTORY: Social History   Socioeconomic History   Marital status: Widowed    Spouse  name: Not on file   Number of children: Not on file   Years of education: Not on file   Highest education level: Not on file  Occupational History   Occupation: Retired  Tobacco Use   Smoking status: Never   Smokeless tobacco: Never  Vaping Use   Vaping status: Never Used  Substance and Sexual Activity   Alcohol use: No   Drug use: Never   Sexual activity: Not on file  Other Topics Concern   Not on file  Social History Narrative   Pt lives w/ son, dtr-in-law and grandchildren.    Social Drivers of Corporate investment banker Strain: Not on file  Food Insecurity: No Food Insecurity (10/27/2022)   Hunger Vital Sign    Worried About Running Out of Food in the Last Year: Never true    Ran Out of Food in the Last Year: Never true  Transportation Needs: No Transportation Needs (10/27/2022)   PRAPARE - Administrator, Civil Service (Medical): No    Lack of Transportation (Non-Medical): No  Physical Activity: Not on file  Stress: Not on file  Social Connections: Not on file    MEDICATIONS:  Current Outpatient Medications  Medication Sig Dispense Refill   ADMELOG 100 UNIT/ML injection INJECT 40 UNITS INTO THE SKIN DAILY. VIA PUMP 40 mL 1   aspirin EC 81 MG tablet Take 1 tablet (81 mg total) by mouth daily. 90 tablet 3   Calcium-Magnesium-Vitamin D (CALCIUM MAGNESIUM PO) Take 1 tablet by mouth daily.     carvedilol (COREG) 6.25 MG tablet Take 1 tablet (6.25 mg total) by mouth 2 (two) times daily with a meal. 180 tablet 3   Coenzyme Q10 (CO Q 10 PO) Take 1 capsule by mouth daily.     Continuous Blood Gluc Sensor (DEXCOM G6 SENSOR) MISC 1 each by Does not apply route See admin instructions. Use one sensor once every 10 days to monitor blood sugars. 3 each 0   Continuous Blood Gluc Transmit (DEXCOM G6 TRANSMITTER) MISC 1 each by Does not apply route See admin instructions. Use one transmitter once every 90 days to transmit blood sugars.     ezetimibe (ZETIA) 10 MG tablet  Take 1 tablet (10 mg total) by mouth daily. 90 tablet 0   gentamicin cream (GARAMYCIN) 0.1 % Apply 1 Application topically 2 (two) times daily. 30 g 1   Glucagon (GVOKE HYPOPEN 1-PACK) 1 MG/0.2ML SOAJ Inject 1 mg into the skin as needed (low blood sugar with impaired consciousness). 0.4 mL 2   glucose blood test strip Use Contour Next test strips as instructed to check blood sugar 4 times daily. DX:E10.65 150  each 1   Insulin Disposable Pump (OMNIPOD 5 G6 INTRO, GEN 5,) KIT See admin instructions.     Insulin Disposable Pump (OMNIPOD 5 G6 POD, GEN 5,) MISC APPLY EVERY 3 DAYS 30 each 3   isosorbide mononitrate (IMDUR) 30 MG 24 hr tablet Take 1 tablet (30 mg total) by mouth 2 (two) times daily. 60 tablet 11   lisinopril (ZESTRIL) 10 MG tablet Take 1 tablet by mouth daily. 90 tablet 1   Multiple Vitamin (MULTIVITAMIN) tablet Take 1 tablet by mouth daily.     rosuvastatin (CRESTOR) 40 MG tablet Take 1 tablet (40 mg total) by mouth daily. 90 tablet 0   No current facility-administered medications for this visit.    PHYSICAL EXAM: Vitals:   04/18/23 1020  BP: 130/68  Pulse: 78  Resp: 20  SpO2: 95%  Weight: 166 lb (75.3 kg)  Height: 5\' 4"  (1.626 m)   Body mass index is 28.49 kg/m.  Wt Readings from Last 3 Encounters:  04/18/23 166 lb (75.3 kg)  02/18/23 169 lb (76.7 kg)  01/07/23 169 lb (76.7 kg)    General: Well developed, well nourished female in no apparent distress.  HEENT: AT/[place], no external lesions.  Eyes: Conjunctiva clear and no icterus. Neck: Neck supple  Lungs: Respirations not labored Neurologic: Alert, oriented, normal speech Extremities / Skin: Dry. No sores or rashes noted.  Psychiatric: Does not appear depressed or anxious  Diabetic Foot Exam - Simple   No data filed     LABS Reviewed Lab Results  Component Value Date   HGBA1C 9.3 (H) 12/24/2022   HGBA1C 8.7 (H) 09/17/2022   HGBA1C 8.8 (H) 05/21/2022   No results found for: "FRUCTOSAMINE" Lab Results   Component Value Date   CHOL 126 12/24/2022   HDL 75.70 12/24/2022   LDLCALC 36 12/24/2022   TRIG 70.0 12/24/2022   CHOLHDL 2 12/24/2022   Lab Results  Component Value Date   MICRALBCREAT 1.3 09/17/2022   MICRALBCREAT 0.7 09/05/2020   Lab Results  Component Value Date   CREATININE 0.94 12/24/2022   Lab Results  Component Value Date   GFR 57.21 (L) 12/24/2022    ASSESSMENT / PLAN  1. Uncontrolled type 1 diabetes mellitus with hyperglycemia (HCC)     Diabetes Mellitus type 1, complicated by diabetic retinopathy/neuropathy/CKD. - Diabetic status / severity: Uncontrolled.  Lab Results  Component Value Date   HGBA1C 9.3 (H) 12/24/2022    - Hemoglobin A1c goal <7.5%   Patient has uncontrolled type 1 diabetes mellitus.  She has mostly hyperglycemia related with meals postprandially with inadequate mealtime bolus insulin via pump.  Patient is advised to use automatic mode as much as possible.  Advised to meal bolus 10 to 15 minutes before meals and for all the meals.  Dexcom G7 can have more connectivity issue, will keep on Dexcom G6 at this time.  Changed pump setting as follows.  - Medications:  OmniPod 5 insulin pump with Dexcom G6 CGM  Insulin Pump setting:  Basal MN- 0.6u/hour , changed to 0.7 2:30AM    -0.5 , changed to 0.6 6AM- 0.6, changed to 0.8 3PM- 0.8 6PM-   0.85 9PM-   0.7  Bolus CHO Ratio (1unit:CHO) MN- 1:10, changed to 1: 8 11AM- 1:10, changed to 1: 8 3PM-    1:10, changed to 1: 8  Correction/Sensitivity: MN- 1:35  Target: 120-150    Active insulin time: 2.5 hours  - Home glucose testing: continue CGM and check blood glucose as  needed.  - Discussed/ Gave Hypoglycemia treatment plan.  Sent prescription for Glucagon Emergency Kit.  # Consult : not required at this time.   # Annual urine for microalbuminuria/ creatinine ratio, no microalbuminuria currently, continue ACE/ARB /lisinopril. Last  Lab Results  Component Value Date    MICRALBCREAT 1.3 09/17/2022    # Foot check nightly / neuropathy.  # Has diabetic retinopathy, regular follow-up with ophthalmology.  - Diet: Make healthy diabetic food choices - Life style / activity / exercise: Discussed.  2. Blood pressure  -  BP Readings from Last 1 Encounters:  04/18/23 130/68    - Control is in target.  - No change in current plans.  3. Lipid status / Hyperlipidemia - Last  Lab Results  Component Value Date   LDLCALC 36 12/24/2022   - Continue rosuvastatin 40 mg daily.  Zetia 10 mg daily.  Diagnoses and all orders for this visit:  Uncontrolled type 1 diabetes mellitus with hyperglycemia (HCC) -     POCT glycosylated hemoglobin (Hb A1C)    DISPOSITION Follow up in clinic in 3 months suggested.   All questions answered and patient verbalized understanding of the plan.  Natasha Valentina Alcoser, MD [facility] [address] [place], [address] [place], [place] [address] Phone # [phone]  At least part of this note was generated using voice recognition software. Inadvertent word errors may have occurred, which were not recognized during the proofreading process.

## 2023-04-29 ENCOUNTER — Other Ambulatory Visit: Payer: Self-pay

## 2023-04-29 DIAGNOSIS — E1065 Type 1 diabetes mellitus with hyperglycemia: Secondary | ICD-10-CM

## 2023-04-29 MED ORDER — LISINOPRIL 10 MG PO TABS: 10.0000 mg | ORAL_TABLET | Freq: Every day | ORAL | 1 refills | Status: DC

## 2023-05-27 ENCOUNTER — Ambulatory Visit (INDEPENDENT_AMBULATORY_CARE_PROVIDER_SITE_OTHER): Payer: Medicare Other | Admitting: Podiatry

## 2023-05-27 ENCOUNTER — Encounter: Payer: Self-pay | Admitting: Podiatry

## 2023-05-27 VITALS — Ht 64.0 in | Wt 166.0 lb

## 2023-05-27 DIAGNOSIS — L84 Corns and callosities: Secondary | ICD-10-CM | POA: Diagnosis not present

## 2023-05-27 DIAGNOSIS — M79675 Pain in left toe(s): Secondary | ICD-10-CM

## 2023-05-27 DIAGNOSIS — E1042 Type 1 diabetes mellitus with diabetic polyneuropathy: Secondary | ICD-10-CM

## 2023-05-27 DIAGNOSIS — M79674 Pain in right toe(s): Secondary | ICD-10-CM | POA: Diagnosis not present

## 2023-05-27 DIAGNOSIS — B351 Tinea unguium: Secondary | ICD-10-CM

## 2023-05-27 DIAGNOSIS — I739 Peripheral vascular disease, unspecified: Secondary | ICD-10-CM

## 2023-05-27 NOTE — Progress Notes (Signed)
  Subjective:  Patient ID: Lorelie Theoplis Pa, female    DOB: 10/02/1940,  MRN: [id number]  Christel Korine Winton presents to clinic today for at risk footcare. Patient has h/o diabetes, neuropathy and PAD and is seen for  and callus(es) of both feet and painful mycotic toenails that are difficult to trim. Painful toenails interfere with ambulation. Aggravating factors include wearing enclosed shoe gear. Pain is relieved with periodic professional debridement. Painful calluses are aggravated when weightbearing with and without shoegear. Pain is relieved with periodic professional debridement.  She is accompanied by her daughter on today's visit. They state blister on left great toe resolved and her toe was better, so they canceled follow up appt with Dr. Janit.  Chief Complaint  Patient presents with   Nail Problem    Pt is here for [facility] last A1C was over 9 PCP is Dr Von and LOV was in September.     PCP is Von [place], MD (Inactive).  No Known Allergies  Review of Systems: Negative except as noted in the HPI.  Objective: No changes noted in today's physical examination. There were no vitals filed for this visit. Gay Lita Flynn is a pleasant 83 y.o. female WD, WN in NAD. AAO x 3.  Vascular Examination: CFT <3 seconds b/l LE. Faintly palpable pedal pulses b/l. Pedal hair absent. No pain with calf compression b/l. No edema noted b/l LE. No ischemia or gangrene noted b/l LE. No cyanosis or clubbing noted b/l LE.  Dermatological Examination: Pedal skin thin, shiny and atrophic b/l LE. No interdigital macerations noted b/l LE. Toenails 1-5 b/l elongated, discolored, dystrophic, thickened, crumbly with subungual debris and tenderness to dorsal palpation. Hyperkeratotic lesion(s) submet head 2 left foot and submet head 5 right foot.  No erythema, no edema, no drainage, no fluctuance.  Musculoskeletal: Muscle strength 5/5 to all lower extremity muscle groups bilaterally. Pes cavus deformity noted  b/l lower extremities. Severe hammertoe deformity noted 1-5 bilaterally.  Neurological: Protective sensation diminished with 10g monofilament b/l.  Assessment/Plan: 1. Pain due to onychomycosis of toenails of both feet   2. Callus   3. PAD (peripheral artery disease) (HCC)   4. 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)     -Consent given for treatment as described below: -Examined patient. -Continue diabetic shoes daily. -Mycotic toenails 1-5 bilaterally were debrided in length and girth with sterile nail nippers and dremel without incident. -Callus(es) submet head 2 left foot and submet head 5 right foot pared utilizing sterile scalpel blade without complication or incident. Total number debrided =2. -Patient/POA to call should there be question/concern in the interim.   Return in about 3 months (around 08/24/2023).  Delon LITTIE Merlin, DPM      [place] LOCATION: [address] [place] [address]                   Office [phone]   [facility] LOCATION: [address] [place], [place] [address] Office [phone]

## 2023-05-29 ENCOUNTER — Other Ambulatory Visit: Payer: Self-pay | Admitting: Endocrinology

## 2023-05-29 DIAGNOSIS — E7800 Pure hypercholesterolemia, unspecified: Secondary | ICD-10-CM

## 2023-06-05 ENCOUNTER — Encounter: Payer: Self-pay | Admitting: Podiatry

## 2023-06-06 ENCOUNTER — Other Ambulatory Visit: Payer: Self-pay | Admitting: *Deleted

## 2023-06-06 DIAGNOSIS — I6523 Occlusion and stenosis of bilateral carotid arteries: Secondary | ICD-10-CM

## 2023-06-08 ENCOUNTER — Encounter: Payer: Self-pay | Admitting: Endocrinology

## 2023-06-09 ENCOUNTER — Other Ambulatory Visit: Payer: Self-pay | Admitting: Endocrinology

## 2023-06-09 ENCOUNTER — Other Ambulatory Visit: Payer: Self-pay

## 2023-06-09 ENCOUNTER — Telehealth: Payer: Self-pay

## 2023-06-09 DIAGNOSIS — E1065 Type 1 diabetes mellitus with hyperglycemia: Secondary | ICD-10-CM

## 2023-06-09 MED ORDER — INSULIN LISPRO 100 UNIT/ML IJ SOLN: INTRAMUSCULAR | 1 refills | Status: DC

## 2023-06-09 MED ORDER — OMNIPOD 5 DEXG7G6 PODS GEN 5 MISC: 3 refills | Status: AC

## 2023-06-09 NOTE — Telephone Encounter (Signed)
 PA needed for Omnipods

## 2023-06-10 ENCOUNTER — Telehealth: Payer: Self-pay

## 2023-06-10 ENCOUNTER — Other Ambulatory Visit (HOSPITAL_COMMUNITY): Payer: Self-pay

## 2023-06-10 NOTE — Telephone Encounter (Signed)
 Pharmacy Patient Advocate Encounter   Received notification from Pt Calls Messages that prior authorization for Omnipod is required/requested.   Insurance verification completed.   The patient is insured through [employer] .   Per test claim: Product/Service Not Covered - Plan/Benefit Exclusion

## 2023-06-14 NOTE — Telephone Encounter (Signed)
 Pharmacy Patient Advocate Encounter  Received notification from [facility] that Prior Authorization for Omnipod has been APPROVED through 06/10/24

## 2023-06-23 ENCOUNTER — Other Ambulatory Visit (HOSPITAL_COMMUNITY): Payer: Self-pay

## 2023-06-23 NOTE — Telephone Encounter (Signed)
 PA request has been Approved. New Encounter has been or will be created for follow up. For additional info see Pharmacy Prior Auth telephone encounter from 06/10/23.

## 2023-07-05 ENCOUNTER — Ambulatory Visit (INDEPENDENT_AMBULATORY_CARE_PROVIDER_SITE_OTHER): Payer: Medicare Other | Admitting: Nurse Practitioner

## 2023-07-05 ENCOUNTER — Encounter: Payer: Self-pay | Admitting: Nurse Practitioner

## 2023-07-05 VITALS — BP 138/66 | HR 72 | Temp 97.6°F | Ht 64.0 in | Wt 167.2 lb

## 2023-07-05 DIAGNOSIS — Z9012 Acquired absence of left breast and nipple: Secondary | ICD-10-CM | POA: Diagnosis not present

## 2023-07-05 DIAGNOSIS — R29898 Other symptoms and signs involving the musculoskeletal system: Secondary | ICD-10-CM

## 2023-07-05 DIAGNOSIS — F0150 Vascular dementia without behavioral disturbance: Secondary | ICD-10-CM | POA: Insufficient documentation

## 2023-07-05 DIAGNOSIS — R2681 Unsteadiness on feet: Secondary | ICD-10-CM | POA: Diagnosis not present

## 2023-07-05 DIAGNOSIS — R413 Other amnesia: Secondary | ICD-10-CM | POA: Diagnosis not present

## 2023-07-05 NOTE — Progress Notes (Signed)
 Established Patient Visit  Patient: Natasha Hernandez   DOB: June 17, 1940   83 y.o. Female  MRN: [id number] Visit Date: 07/05/2023  Subjective:    Chief Complaint  Patient presents with   Establish Care   H[facility]  Care team:  Dr. Thapa-Endocrinology,  Dr. Arida-Cardiology,  Dr. Ferdie Ping,  Dr. Lenetta Quaker,  Dr. Gwendalyn Ege Specialist.  Mrs. Rozycki is a widow who lives with her youngest son and daughter in Social worker. She had no previous pcp. She was seen by above list of providers. Today she is here to establish care. Her daughter in law Gearldine Bienenstock is concerned about worsening unsteady gain and memory deficit. 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 Her current hobbies are painting, bible class, church service, volunteers in church, time with her cats.  S/P mastectomy, left Done in [place] in 1990s, unknown reason, denies any mammogram  Memory deficit Mrs. Ciano and daughter in law acknowledge gradual difficulty with short and long term memory, worse in last 39month. She denies any previous head injury. No Fhx of dementia. Hx of hearing loss-use of hearing aids >51yrs MRI brain 2020: No acute intracranial abnormality. 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 Mild age-related atrophy with chronic small vessel ischemic disease.  MMSE completed today: 18/30 She declined any additional work up and referral to neurology. F/up in 3months  Unsteady gait Chronic Unsteady gait, onset 54yrs ago, worse in last 6months. Occasional use of cane, has difficulty climbing stairs due to leg weakness. Denies any fall in last 1year. Denies any previous spine surgery or injury. Hx of DIABETES peripheral neuropathy, followed by Dr. Ferdie Ping. Hx of DDD-lumbar and  thoracic spine  She declined home health PT  and OT order. Advised to use a cane or walker. Provided information on fall prevention  Reviewed medical, surgical, and social history today  Medications: Outpatient Medications Prior to Visit  Medication Sig   aspirin EC 81 MG tablet Take 1 tablet (81 mg total) by mouth daily.   Calcium-Magnesium-Vitamin D (CALCIUM MAGNESIUM PO) Take 1 tablet by mouth daily.   carvedilol (COREG) 6.25 MG tablet Take 1 tablet (6.25 mg total) by mouth 2 (two) times daily with a meal.   Coenzyme Q10 (CO Q 10 PO) Take 1 capsule by mouth daily.   Continuous Blood Gluc Sensor (DEXCOM G6 SENSOR) MISC 1 each by Does not apply route See admin instructions. Use one sensor once every 10 days to monitor blood sugars.   Continuous Blood Gluc Transmit (DEXCOM G6 TRANSMITTER) MISC 1 each by Does not apply route See admin instructions. Use one transmitter once every 90 days to transmit blood sugars.   ezetimibe (ZETIA) 10 MG tablet Take 1 tablet by mouth daily.   gentamicin cream (GARAMYCIN) 0.1 % Apply 1 Application topically 2 (two) times daily.   Glucagon (GVOKE HYPOPEN 1-PACK) 1 MG/0.2ML SOAJ Inject 1 mg into the skin as needed (low blood sugar with impaired consciousness).   glucose blood test strip Use Contour Next test strips as instructed to check blood sugar 4 times daily. 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  insulin lispro (ADMELOG) 100 UNIT/ML injection INJECT 40 UNITS INTO THE SKIN DAILY. VIA PUMP   isosorbide mononitrate (IMDUR) 30 MG 24 hr tablet Take 1 tablet (30 mg total) by mouth 2 (two) times daily.   lisinopril (ZESTRIL) 10 MG tablet Take 1 tablet (10 mg total) by mouth daily.   Multiple Vitamin (MULTIVITAMIN) tablet Take 1 tablet by mouth daily.   rosuvastatin (CRESTOR) 40 MG tablet Take 1 tablet by mouth daily.   No  facility-administered medications prior to visit.   Reviewed past medical and social history.   ROS per HPI above      Objective:  BP 138/66 (BP Location: Left Arm, Patient Position: Sitting, Cuff Size: Normal)   Pulse 72   Temp 97.6 F (36.4 C) (Temporal)   Ht 5\' 4"  (1.626 m)   Wt 167 lb 3.2 oz (75.8 kg)   SpO2 97%   BMI 28.70 kg/m      Physical Exam Cardiovascular:     Rate and Rhythm: Normal rate.     Pulses: Normal pulses.  Pulmonary:     Effort: Pulmonary effort is normal.  Musculoskeletal:     Right lower leg: No edema.     Left lower leg: No edema.  Neurological:     Mental Status: She is alert and oriented to person, place, and time.     Motor: Weakness and atrophy present. No tremor or pronator drift.     Coordination: Romberg sign negative. Finger-Nose-Finger Test normal.     Gait: Gait abnormal and tandem walk abnormal.     No results found for any visits on 07/05/23.    Assessment & Plan:    Problem List Items Addressed This Visit     Memory deficit   Mrs. Cendejas and daughter in law acknowledge gradual difficulty with short and long term memory, worse in last 41month. She denies any previous head injury. No Fhx of dementia. Hx of hearing loss-use of hearing aids >84yrs MRI brain 2020: No acute intracranial abnormality. 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 Mild age-related atrophy with chronic small vessel ischemic disease.  MMSE completed today: 18/30 She declined any additional work up and referral to neurology. F/up in 3months      S/P mastectomy, left   Done in [place] in 1990s, unknown reason, denies any mammogram      Unsteady gait - Primary   Chronic Unsteady gait, onset 36yrs ago, worse in last 6months. Occasional use of cane, has difficulty climbing stairs  due to leg weakness. Denies any fall in last 1year. Denies any previous spine surgery or injury. Hx of DIABETES peripheral neuropathy, followed by Dr. Ferdie Ping. Hx of DDD-lumbar and thoracic spine  She declined home health PT  and OT order. Advised to use a cane or walker. Provided information on fall prevention      Other Visit Diagnoses       Leg weakness, bilateral          Return in about 3 months (around 10/05/2023) for leg weakness and memory deficit. (complete MMSE screen).     Alysia Penna, NP

## 2023-07-05 NOTE — Assessment & Plan Note (Signed)
 Done in [place] in 1990s, unknown reason, denies any mammogram

## 2023-07-05 NOTE — Assessment & Plan Note (Signed)
" >>  ASSESSMENT AND PLAN FOR MEMORY DEFICIT WRITTEN ON 07/05/2023 11:24 AM BY Asti Mackley LUM, NP  Mrs. Bulow and daughter in law acknowledge gradual difficulty with short and long term memory, worse in last 15month. She denies any previous head injury. No Fhx of dementia. Hx of hearing loss-use of hearing aids >54yrs MRI brain 2020: No acute intracranial abnormality. 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 Mild age-related atrophy with chronic small vessel ischemic disease.  MMSE completed today: 18/30 She declined any additional work up and referral to neurology. F/up in 3months "

## 2023-07-05 NOTE — Assessment & Plan Note (Addendum)
 Chronic Unsteady gait, onset 27yrs ago, worse in last 6months. Occasional use of cane, has difficulty climbing stairs due to leg weakness. Denies any fall in last 1year. Denies any previous spine surgery or injury. Hx of DIABETES peripheral neuropathy, followed by Dr. Ferdie Ping. Hx of DDD-lumbar and thoracic spine  She declined home health PT  and OT order. Advised to use a cane or walker. Provided information on fall prevention

## 2023-07-05 NOTE — Assessment & Plan Note (Addendum)
 Mrs. Gorby and daughter in law acknowledge gradual difficulty with short and long term memory, worse in last 71month. She denies any previous head injury. No Fhx of dementia. Hx of hearing loss-use of hearing aids >33yrs MRI brain 2020: No acute intracranial abnormality. 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 Mild age-related atrophy with chronic small vessel ischemic disease.  MMSE completed today: 18/30 She declined any additional work up and referral to neurology. F/up in 3months

## 2023-07-05 NOTE — Patient Instructions (Signed)
 Let me know if you change your mind about Home physical therapy and referral to neurology. Please send copy of living will and health care power of attorney. Use cane to prevent fall  Fall Prevention in the Home, Adult Falls can cause injuries and affect people of all ages. There are many simple things that you can do to make your home safe and to help prevent falls. If you need it, ask for help making these changes. What actions can I take to prevent falls? General information Use good lighting in all rooms. Make sure to: Replace any light bulbs that burn out. Turn on lights if it is dark and use night-lights. Keep items that you use often in easy-to-reach places. Lower the shelves around your home if needed. Move furniture so that there are clear paths around it. Do not keep throw rugs or other things on the floor that can make you trip. If any of your floors are uneven, fix them. Add color or contrast paint or tape to clearly mark and help you see: Grab bars or handrails. First and last steps of staircases. Where the edge of each step is. If you use a ladder or stepladder: Make sure that it is fully opened. Do not climb a closed ladder. Make sure the sides of the ladder are locked in place. Have someone hold the ladder while you use it. Know where your pets are as you move through your home. What can I do in the bathroom?     Keep the floor dry. Clean up any water that is on the floor right away. Remove soap buildup in the bathtub or shower. Buildup makes bathtubs and showers slippery. Use non-skid mats or decals on the floor of the bathtub or shower. Attach bath mats securely with double-sided, non-slip rug tape. If you need to sit down while you are in the shower, use a non-slip stool. Install grab bars by the toilet and in the bathtub and shower. Do not use towel bars as grab bars. What can I do in the bedroom? Make sure that you have a light by your bed that is easy to  reach. Do not use any sheets or blankets on your bed that hang to the floor. Have a firm bench or chair with side arms that you can use for support when you get dressed. What can I do in the kitchen? Clean up any spills right away. If you need to reach something above you, use a sturdy step stool that has a grab bar. Keep electrical cables out of the way. Do not use floor polish or wax that makes floors slippery. What can I do with my stairs? Do not leave anything on the stairs. Make sure that you have a light switch at the top and the bottom of the stairs. Have them installed if you do not have them. Make sure that there are handrails on both sides of the stairs. Fix handrails that are broken or loose. Make sure that handrails are as long as the staircases. Install non-slip stair treads on all stairs in your home if they do not have carpet. Avoid having throw rugs at the top or bottom of stairs, or secure the rugs with carpet tape to prevent them from moving. Choose a carpet design that does not hide the edge of steps on the stairs. Make sure that carpet is firmly attached to the stairs. Fix any carpet that is loose or worn. What can I do on the  outside of my home? Use bright outdoor lighting. Repair the edges of walkways and driveways and fix any cracks. Clear paths of anything that can make you trip, such as tools or rocks. Add color or contrast paint or tape to clearly mark and help you see high doorway thresholds. Trim any bushes or trees on the main path into your home. Check that handrails are securely fastened and in good repair. Both sides of all steps should have handrails. Install guardrails along the edges of any raised decks or porches. Have leaves, snow, and ice cleared regularly. Use sand, salt, or ice melt on walkways during winter months if you live where there is ice and snow. In the garage, clean up any spills right away, including grease or oil spills. What other actions  can I take? Review your medicines with your health care provider. Some medicines can make you confused or feel dizzy. This can increase your chance of falling. Wear closed-toe shoes that fit well and support your feet. Wear shoes that have rubber soles and low heels. Use a cane, walker, scooter, or crutches that help you move around if needed. Talk with your provider about other ways that you can decrease your risk of falls. This may include seeing a physical therapist to learn to do exercises to improve movement and strength. Where to find more information Centers for Disease Control and Prevention, [employer]: TonerPromos.no [employer] on Aging: BaseRingTones.pl National Institute on Aging: BaseRingTones.pl Contact a health care provider if: You are afraid of falling at home. You feel weak, drowsy, or dizzy at home. You fall at home. Get help right away if you: Lose consciousness or have trouble moving after a fall. Have a fall that causes a head injury. These symptoms may be an emergency. Get help right away. Call 911. Do not wait to see if the symptoms will go away. Do not drive yourself to the hospital. This information is not intended to replace advice given to you by your health care provider. Make sure you discuss any questions you have with your health care provider. Document Revised: 12/07/2021 Document Reviewed: 12/07/2021 Elsevier Patient Education  2024 [employer].

## 2023-07-11 ENCOUNTER — Encounter: Payer: Self-pay | Admitting: Nurse Practitioner

## 2023-07-11 DIAGNOSIS — R413 Other amnesia: Secondary | ICD-10-CM

## 2023-07-11 DIAGNOSIS — R29898 Other symptoms and signs involving the musculoskeletal system: Secondary | ICD-10-CM

## 2023-07-11 DIAGNOSIS — R2681 Unsteadiness on feet: Secondary | ICD-10-CM

## 2023-07-18 ENCOUNTER — Ambulatory Visit (INDEPENDENT_AMBULATORY_CARE_PROVIDER_SITE_OTHER): Payer: Medicare Other | Admitting: Endocrinology

## 2023-07-18 ENCOUNTER — Encounter: Payer: Self-pay | Admitting: Endocrinology

## 2023-07-18 VITALS — BP 120/70 | HR 84 | Ht 64.0 in | Wt 167.0 lb

## 2023-07-18 DIAGNOSIS — E1065 Type 1 diabetes mellitus with hyperglycemia: Secondary | ICD-10-CM | POA: Diagnosis not present

## 2023-07-18 LAB — POCT GLYCOSYLATED HEMOGLOBIN (HGB A1C): Hemoglobin A1C: 8.5 % — AB (ref 4.0–5.6)

## 2023-07-18 NOTE — Progress Notes (Signed)
 Outpatient Endocrinology Note [place] Arav Bannister, MD  07/18/23  Patient's Name: Natasha Hernandez    DOB: 1940-10-28    MRN: [id number]                                                    REASON OF VISIT: Follow up for type 1 diabetes mellitus  PCP: Nche, Bonna Gains, NP  HISTORY OF PRESENT ILLNESS:   Bobbye Gwenevere Goga is a 83 y.o. old female with past medical history listed below, is here for follow up for type 1 diabetes mellitus.   Pertinent Diabetes History: Patient was diagnosed with type 1 diabetes mellitus in 1953.  Patient had been on various insulin regimen over the years.  At this time she has been on OmniPod 5 insulin pump with Dexcom G6 CGM since November 2022.  Patient's daughter-in-law is supervising for managing the insulin pump at home.  Chronic Diabetes Complications : Retinopathy: yes.  Has proliferative but a stable retinopathy, regularly following with ophthalmology. Nephropathy: CKD, on lisinopril Peripheral neuropathy: yes, followed up with podiatry. Coronary artery disease: yes Stroke: no  Relevant comorbidities and cardiovascular risk factors: Obesity: no Body mass index is 28.67 kg/m.  Hypertension: yes Hyperlipidemia. Yes, on statin.   Current / Home Diabetic regimen includes:  OmniPod 5 insulin pump with Dexcom G6 CGM, using Admelog U100  Insulin Pump setting:  Basal ( 18.1 units / day) MN- 0.7 u/hour  2:30AM-0.6  6AM- 0.8 3PM- 0.8 6PM-   0.85 9PM-   0.7  Bolus CHO Ratio (1unit:CHO) MN- 1:8 11AM- 1:8 3PM-    1:8  Correction/Sensitivity: MN- 1:35  Target: 120-150    Active insulin time: 2.5 hours  Prior diabetic medications:  CONTINUOUS GLUCOSE MONITORING SYSTEM (CGMS) / INSULIN PUMP INTERPRETATION:                         OmniPod 5 Pump & Sensor Download (Reviewed and summarized below.) Dates: March 18 to July 18, 2023 last 14 days  Glucose Management Indicator: 7.9%  CGM/Sensor usage:94%   Average total daily insulin:   24 units, Basal: 71%, Bolus: 29%.  Automated mode in Use: 65%, Manual mode 35%    Trends: Mostly acceptable blood sugar, postprandial hyperglycemia with blood sugar up to 200-250 range and rarely significant hyperglycemia with blood sugar up to 300 range especially with bedtime snack.  Blood sugar in between the meals and early morning acceptable.  No concerning hypoglycemia.  GMI on CGM improved to 7.9%.  Hypoglycemia: Patient has no hypoglycemic episodes. Patient has hypoglycemia awareness.    Factors modifying glucose control: 1.  Diabetic diet assessment: Usually 3 meals a day and sometimes related to bedtime snack.   Snack.  2.  Staying active or exercising: Walking.  3.  Medication compliance: compliant all of the time.  Interval history  Pump and CGM data as reviewed above.  Improvement on diabetes control with GMI on CGM 7.9%.  She has improved percentage of use of automated mode. Patient is accompanied by her daughter-in-law in the clinic today.  No other complaints today.  REVIEW OF SYSTEMS As per history of present illness.   PAST MEDICAL HISTORY: Past Medical History:  Diagnosis Date   AKI (acute kidney injury) (HCC) 04/16/2018   Carotid artery occlusion    Coronary  artery disease    CABG in 1996. Most recent cardiac catheterization in 2013 showed patent grafts including LIMA to LAD, SVG to OM, SVG to diagonal and SVG to PDA.   Diabetes mellitus without complication (HCC)    Hyperlipidemia    Hypertension    PAD (peripheral artery disease) (HCC)    Pain in finger of right hand 04/13/2016   Pneumonia due to COVID-19 virus 03/27/2019    PAST SURGICAL HISTORY: Past Surgical History:  Procedure Laterality Date   CARDIAC CATHETERIZATION     CORONARY ARTERY BYPASS GRAFT  1996   EYE SURGERY      ALLERGIES: No Known Allergies  FAMILY HISTORY:  Family History  Problem Relation Age of Onset   Breast cancer Mother 12   Stroke Mother    Emphysema Father 68    Heart attack Father     SOCIAL HISTORY: Social History   Socioeconomic History   Marital status: Widowed    Spouse name: Not on file   Number of children: Not on file   Years of education: Not on file   Highest education level: Bachelor's degree (e.g., BA, AB, BS)  Occupational History   Occupation: Retired  Tobacco Use   Smoking status: Never   Smokeless tobacco: Never  Vaping Use   Vaping status: Never Used  Substance and Sexual Activity   Alcohol use: No   Drug use: Never   Sexual activity: Not on file  Other Topics Concern   Not on file  Social History Narrative   Pt lives w/ son, dtr-in-law and grandchildren.    Social Drivers of Corporate investment banker Strain: Low Risk  (07/04/2023)   Overall Financial Resource Strain (CARDIA)    Difficulty of Paying Living Expenses: Not hard at all  Food Insecurity: No Food Insecurity (07/04/2023)   Hunger Vital Sign    Worried About Running Out of Food in the Last Year: Never true    Ran Out of Food in the Last Year: Never true  Transportation Needs: No Transportation Needs (07/04/2023)   PRAPARE - Administrator, Civil Service (Medical): No    Lack of Transportation (Non-Medical): No  Physical Activity: Unknown (07/04/2023)   Exercise Vital Sign    Days of Exercise per Week: 0 days    Minutes of Exercise per Session: Not on file  Stress: No Stress Concern Present (07/04/2023)   [employer] of Occupational Health - Occupational Stress Questionnaire    Feeling of Stress : Not at all  Social Connections: Moderately Integrated (07/04/2023)   Social Connection and Isolation Panel [NHANES]    Frequency of Communication with Friends and Family: More than three times a week    Frequency of Social Gatherings with Friends and Family: Twice a week    Attends Religious Services: More than 4 times per year    Active Member of [employer] or Organizations: Yes    Attends Banker Meetings: 1 to 4 times per year     Marital Status: Widowed    MEDICATIONS:  Current Outpatient Medications  Medication Sig Dispense Refill   aspirin EC 81 MG tablet Take 1 tablet (81 mg total) by mouth daily. 90 tablet 3   Calcium-Magnesium-Vitamin D (CALCIUM MAGNESIUM PO) Take 1 tablet by mouth daily.     carvedilol (COREG) 6.25 MG tablet Take 1 tablet (6.25 mg total) by mouth 2 (two) times daily with a meal. 180 tablet 3   Coenzyme Q10 (CO Q 10 PO)  Take 1 capsule by mouth daily.     Continuous Blood Gluc Sensor (DEXCOM G6 SENSOR) MISC 1 each by Does not apply route See admin instructions. Use one sensor once every 10 days to monitor blood sugars. 3 each 0   Continuous Blood Gluc Transmit (DEXCOM G6 TRANSMITTER) MISC 1 each by Does not apply route See admin instructions. Use one transmitter once every 90 days to transmit blood sugars.     ezetimibe (ZETIA) 10 MG tablet Take 1 tablet by mouth daily. 90 tablet 3   gentamicin cream (GARAMYCIN) 0.1 % Apply 1 Application topically 2 (two) times daily. 30 g 1   Glucagon (GVOKE HYPOPEN 1-PACK) 1 MG/0.2ML SOAJ Inject 1 mg into the skin as needed (low blood sugar with impaired consciousness). 0.4 mL 2   glucose blood test strip Use Contour Next test strips as instructed to check blood sugar 4 times daily. DX:E10.65 150 each 1   Insulin Disposable Pump (OMNIPOD 5 DEXG7G6 PODS GEN 5) MISC APPLY EVERY 3 DAYS 30 each 3   Insulin Disposable Pump (OMNIPOD 5 G6 INTRO, GEN 5,) KIT See admin instructions.     insulin lispro (ADMELOG) 100 UNIT/ML injection INJECT 40 UNITS INTO THE SKIN DAILY. VIA PUMP 40 mL 1   isosorbide mononitrate (IMDUR) 30 MG 24 hr tablet Take 1 tablet (30 mg total) by mouth 2 (two) times daily. 60 tablet 11   lisinopril (ZESTRIL) 10 MG tablet Take 1 tablet (10 mg total) by mouth daily. 90 tablet 1   Multiple Vitamin (MULTIVITAMIN) tablet Take 1 tablet by mouth daily.     rosuvastatin (CRESTOR) 40 MG tablet Take 1 tablet by mouth daily. 90 tablet 3   No current  facility-administered medications for this visit.    PHYSICAL EXAM: Vitals:   07/18/23 0951  BP: 120/70  Pulse: 84  SpO2: 96%  Weight: 167 lb (75.8 kg)  Height: 5\' 4"  (1.626 m)    Body mass index is 28.67 kg/m.  Wt Readings from Last 3 Encounters:  07/18/23 167 lb (75.8 kg)  07/05/23 167 lb 3.2 oz (75.8 kg)  05/27/23 166 lb (75.3 kg)    General: Well developed, well nourished female in no apparent distress.  HEENT: AT/[place], no external lesions.  Eyes: Conjunctiva clear and no icterus. Neck: Neck supple  Lungs: Respirations not labored Neurologic: Alert, oriented, normal speech Extremities / Skin: Dry. No sores or rashes noted.  Psychiatric: Does not appear depressed or anxious  Diabetic Foot Exam - Simple   No data filed     LABS Reviewed Lab Results  Component Value Date   HGBA1C 8.5 (A) 07/18/2023   HGBA1C 9.8 (A) 04/18/2023   HGBA1C 9.3 (H) 12/24/2022   No results found for: "FRUCTOSAMINE" Lab Results  Component Value Date   CHOL 126 12/24/2022   HDL 75.70 12/24/2022   LDLCALC 36 12/24/2022   TRIG 70.0 12/24/2022   CHOLHDL 2 12/24/2022   Lab Results  Component Value Date   MICRALBCREAT 1.3 09/17/2022   MICRALBCREAT 0.7 09/05/2020   Lab Results  Component Value Date   CREATININE 0.94 12/24/2022   Lab Results  Component Value Date   GFR 57.21 (L) 12/24/2022    ASSESSMENT / PLAN  1. Uncontrolled type 1 diabetes mellitus with hyperglycemia (HCC)     Diabetes Mellitus type 1, complicated by diabetic retinopathy/neuropathy/CKD. - Diabetic status / severity: Uncontrolled.  Lab Results  Component Value Date   HGBA1C 8.5 (A) 07/18/2023    - Hemoglobin A1c goal <  7.5%   Diabetes control improving.  GMI on CGM 7.9%.  Patient has uncontrolled type 1 diabetes mellitus.  She has mostly hyperglycemia related with meals postprandially with inadequate mealtime bolus insulin via pump.  Patient is advised to use automatic mode as much as possible.   Advised to meal bolus 10 to 15 minutes before meals and for all the meals.  Dexcom G7 can have more connectivity issue, will keep on Dexcom G6 at this time.  Mostly acceptable mild hyperglycemia however at times has significant hyperglycemia after blood sugar 300 range related to bedtime snacks, advised to avoid or limit bedtime snacks.  Changed pump setting as follows.  - Medications:  OmniPod 5 insulin pump with Dexcom G6 CGM No change on the pump settings today.  - Home glucose testing: continue CGM and check blood glucose as needed.  - Discussed/ Gave Hypoglycemia treatment plan.  Has Glucagon Emergency Kit at home.  # Consult : not required at this time.   # Annual urine for microalbuminuria/ creatinine ratio, no microalbuminuria currently, continue ACE/ARB /lisinopril. Last  Lab Results  Component Value Date   MICRALBCREAT 1.3 09/17/2022    # Foot check nightly / neuropathy.  # Has diabetic retinopathy, regular follow-up with ophthalmology.  - Diet: Make healthy diabetic food choices - Life style / activity / exercise: Discussed.  2. Blood pressure  -  BP Readings from Last 1 Encounters:  07/18/23 120/70    - Control is in target.  - No change in current plans.  3. Lipid status / Hyperlipidemia - Last  Lab Results  Component Value Date   LDLCALC 36 12/24/2022   - Continue rosuvastatin 40 mg daily.  Zetia 10 mg daily.  Suleima was seen today for follow-up.  Diagnoses and all orders for this visit:  Uncontrolled type 1 diabetes mellitus with hyperglycemia (HCC) -     POCT glycosylated hemoglobin (Hb A1C)    DISPOSITION Follow up in clinic in 3 months suggested.   All questions answered and patient verbalized understanding of the plan.  [place] Bodin Gorka, MD [facility] Endocrinology [facility] [address] [place], [address] [place], [place] [address] Phone # [phone]  At least part of this note was generated using voice recognition software.  Inadvertent word errors may have occurred, which were not recognized during the proofreading process.

## 2023-08-01 ENCOUNTER — Telehealth: Payer: Self-pay

## 2023-08-01 DIAGNOSIS — E1151 Type 2 diabetes mellitus with diabetic peripheral angiopathy without gangrene: Secondary | ICD-10-CM | POA: Diagnosis not present

## 2023-08-01 DIAGNOSIS — Z556 Problems related to health literacy: Secondary | ICD-10-CM | POA: Diagnosis not present

## 2023-08-01 DIAGNOSIS — Z794 Long term (current) use of insulin: Secondary | ICD-10-CM | POA: Diagnosis not present

## 2023-08-01 DIAGNOSIS — I10 Essential (primary) hypertension: Secondary | ICD-10-CM | POA: Diagnosis not present

## 2023-08-01 DIAGNOSIS — I2510 Atherosclerotic heart disease of native coronary artery without angina pectoris: Secondary | ICD-10-CM | POA: Diagnosis not present

## 2023-08-01 DIAGNOSIS — Z951 Presence of aortocoronary bypass graft: Secondary | ICD-10-CM | POA: Diagnosis not present

## 2023-08-01 DIAGNOSIS — Z7982 Long term (current) use of aspirin: Secondary | ICD-10-CM | POA: Diagnosis not present

## 2023-08-01 DIAGNOSIS — Z604 Social exclusion and rejection: Secondary | ICD-10-CM | POA: Diagnosis not present

## 2023-08-01 DIAGNOSIS — H9190 Unspecified hearing loss, unspecified ear: Secondary | ICD-10-CM | POA: Diagnosis not present

## 2023-08-01 DIAGNOSIS — M5135 Other intervertebral disc degeneration, thoracolumbar region: Secondary | ICD-10-CM | POA: Diagnosis not present

## 2023-08-01 DIAGNOSIS — E1142 Type 2 diabetes mellitus with diabetic polyneuropathy: Secondary | ICD-10-CM | POA: Diagnosis not present

## 2023-08-01 NOTE — Telephone Encounter (Signed)
 Forwarding message below

## 2023-08-01 NOTE — Telephone Encounter (Signed)
 Copied from CRM [id number]. Topic: Clinical - Home Health Verbal Orders >> Aug 01, 2023 11:47 AM Artemio Larry wrote: Caller/Agency: [facility]  Callback Number: [phone] Service Requested: Physical Therapy Frequency: 1wk for 8 wks  Any new concerns about the patient? No

## 2023-08-02 NOTE — Telephone Encounter (Signed)
 Notification received from [facility] that pt refused OT on 07/29/23. No follow up from PCP needed. Report in Media tab for review if needed.

## 2023-08-09 DIAGNOSIS — Z794 Long term (current) use of insulin: Secondary | ICD-10-CM | POA: Diagnosis not present

## 2023-08-09 DIAGNOSIS — E1142 Type 2 diabetes mellitus with diabetic polyneuropathy: Secondary | ICD-10-CM | POA: Diagnosis not present

## 2023-08-09 DIAGNOSIS — I2510 Atherosclerotic heart disease of native coronary artery without angina pectoris: Secondary | ICD-10-CM | POA: Diagnosis not present

## 2023-08-09 DIAGNOSIS — M5135 Other intervertebral disc degeneration, thoracolumbar region: Secondary | ICD-10-CM | POA: Diagnosis not present

## 2023-08-09 DIAGNOSIS — I10 Essential (primary) hypertension: Secondary | ICD-10-CM | POA: Diagnosis not present

## 2023-08-09 DIAGNOSIS — E1151 Type 2 diabetes mellitus with diabetic peripheral angiopathy without gangrene: Secondary | ICD-10-CM | POA: Diagnosis not present

## 2023-08-09 NOTE — Telephone Encounter (Signed)
 Called Geraldean Klein at [facility] and informed him that Kathrene Parents, NP is agreeable to the 1x week for 8 weeks of Physical Therapy (PT) for patient. He thanked me for calling.

## 2023-08-12 ENCOUNTER — Telehealth: Payer: Self-pay

## 2023-08-12 ENCOUNTER — Other Ambulatory Visit (HOSPITAL_COMMUNITY): Payer: Self-pay

## 2023-08-12 NOTE — Telephone Encounter (Signed)
 Pharmacy Patient Advocate Encounter   Received notification from RX Request Messages that prior authorization for Omnipod is required/requested.   Insurance verification completed.   The patient is insured through [employer] .   Per test claim: When I run a test claim for 15 pods in 30 days, the claim is successful with a copay of $12.15.   When I run it 30 pods for 90 days it says refill too soon. Last filled 06/15/23 for a 90 day supply (30 pods). Next fill 09/04/23.   There is already a PA on file that expires 06/10/24

## 2023-08-15 ENCOUNTER — Telehealth: Payer: Self-pay | Admitting: Nurse Practitioner

## 2023-08-15 DIAGNOSIS — I10 Essential (primary) hypertension: Secondary | ICD-10-CM | POA: Diagnosis not present

## 2023-08-15 DIAGNOSIS — E1151 Type 2 diabetes mellitus with diabetic peripheral angiopathy without gangrene: Secondary | ICD-10-CM | POA: Diagnosis not present

## 2023-08-15 DIAGNOSIS — Z556 Problems related to health literacy: Secondary | ICD-10-CM | POA: Diagnosis not present

## 2023-08-15 DIAGNOSIS — Z604 Social exclusion and rejection: Secondary | ICD-10-CM | POA: Diagnosis not present

## 2023-08-15 DIAGNOSIS — I2510 Atherosclerotic heart disease of native coronary artery without angina pectoris: Secondary | ICD-10-CM | POA: Diagnosis not present

## 2023-08-15 DIAGNOSIS — Z7982 Long term (current) use of aspirin: Secondary | ICD-10-CM | POA: Diagnosis not present

## 2023-08-15 DIAGNOSIS — Z951 Presence of aortocoronary bypass graft: Secondary | ICD-10-CM | POA: Diagnosis not present

## 2023-08-15 DIAGNOSIS — E1142 Type 2 diabetes mellitus with diabetic polyneuropathy: Secondary | ICD-10-CM | POA: Diagnosis not present

## 2023-08-15 DIAGNOSIS — Z794 Long term (current) use of insulin: Secondary | ICD-10-CM | POA: Diagnosis not present

## 2023-08-15 DIAGNOSIS — H9190 Unspecified hearing loss, unspecified ear: Secondary | ICD-10-CM | POA: Diagnosis not present

## 2023-08-15 DIAGNOSIS — M5135 Other intervertebral disc degeneration, thoracolumbar region: Secondary | ICD-10-CM | POA: Diagnosis not present

## 2023-08-15 NOTE — Telephone Encounter (Signed)
 Patient dropped off document Home Health Certificate (Order ID [id number]), to be filled out by provider. Patient requested to send it back via Fax within 7-days. Document is located in providers tray at front office.Please advise at Mobile [phone] (mobile)   Home health cert. By fax/  I put in the dr box

## 2023-08-16 ENCOUNTER — Ambulatory Visit: Payer: Medicare Other | Attending: Cardiovascular Disease

## 2023-08-16 DIAGNOSIS — I6523 Occlusion and stenosis of bilateral carotid arteries: Secondary | ICD-10-CM | POA: Insufficient documentation

## 2023-08-16 NOTE — Telephone Encounter (Signed)
 Called and left a detailed voice message for patient per DPR about the completed forms being faxed to [facility] and to give me a call back at the office if there are any questions.

## 2023-08-16 NOTE — Telephone Encounter (Signed)
 CLINICAL USE BELOW THIS LINE (use X to signify taken)  ____Form received and placed in providers office for signature. __X__Form completed and faxed to Home Health. ____Form completed & LVM to notify pt ready for pick up __X__Charge sheet & copy of form in front office folder for office supervisor.

## 2023-08-17 DIAGNOSIS — M5135 Other intervertebral disc degeneration, thoracolumbar region: Secondary | ICD-10-CM | POA: Diagnosis not present

## 2023-08-17 DIAGNOSIS — Z794 Long term (current) use of insulin: Secondary | ICD-10-CM | POA: Diagnosis not present

## 2023-08-17 DIAGNOSIS — I2510 Atherosclerotic heart disease of native coronary artery without angina pectoris: Secondary | ICD-10-CM | POA: Diagnosis not present

## 2023-08-17 DIAGNOSIS — E1151 Type 2 diabetes mellitus with diabetic peripheral angiopathy without gangrene: Secondary | ICD-10-CM | POA: Diagnosis not present

## 2023-08-17 DIAGNOSIS — E1142 Type 2 diabetes mellitus with diabetic polyneuropathy: Secondary | ICD-10-CM | POA: Diagnosis not present

## 2023-08-17 DIAGNOSIS — I10 Essential (primary) hypertension: Secondary | ICD-10-CM | POA: Diagnosis not present

## 2023-08-19 ENCOUNTER — Other Ambulatory Visit: Payer: Self-pay | Admitting: *Deleted

## 2023-08-19 DIAGNOSIS — I6523 Occlusion and stenosis of bilateral carotid arteries: Secondary | ICD-10-CM

## 2023-08-29 DIAGNOSIS — Z794 Long term (current) use of insulin: Secondary | ICD-10-CM | POA: Diagnosis not present

## 2023-08-29 DIAGNOSIS — I2510 Atherosclerotic heart disease of native coronary artery without angina pectoris: Secondary | ICD-10-CM | POA: Diagnosis not present

## 2023-08-29 DIAGNOSIS — E1142 Type 2 diabetes mellitus with diabetic polyneuropathy: Secondary | ICD-10-CM | POA: Diagnosis not present

## 2023-08-29 DIAGNOSIS — M5135 Other intervertebral disc degeneration, thoracolumbar region: Secondary | ICD-10-CM | POA: Diagnosis not present

## 2023-08-29 DIAGNOSIS — I10 Essential (primary) hypertension: Secondary | ICD-10-CM | POA: Diagnosis not present

## 2023-08-29 DIAGNOSIS — E1151 Type 2 diabetes mellitus with diabetic peripheral angiopathy without gangrene: Secondary | ICD-10-CM | POA: Diagnosis not present

## 2023-08-31 DIAGNOSIS — E1142 Type 2 diabetes mellitus with diabetic polyneuropathy: Secondary | ICD-10-CM | POA: Diagnosis not present

## 2023-08-31 DIAGNOSIS — Z7982 Long term (current) use of aspirin: Secondary | ICD-10-CM | POA: Diagnosis not present

## 2023-08-31 DIAGNOSIS — H9190 Unspecified hearing loss, unspecified ear: Secondary | ICD-10-CM | POA: Diagnosis not present

## 2023-08-31 DIAGNOSIS — Z604 Social exclusion and rejection: Secondary | ICD-10-CM | POA: Diagnosis not present

## 2023-08-31 DIAGNOSIS — Z556 Problems related to health literacy: Secondary | ICD-10-CM | POA: Diagnosis not present

## 2023-08-31 DIAGNOSIS — Z794 Long term (current) use of insulin: Secondary | ICD-10-CM | POA: Diagnosis not present

## 2023-08-31 DIAGNOSIS — I10 Essential (primary) hypertension: Secondary | ICD-10-CM | POA: Diagnosis not present

## 2023-08-31 DIAGNOSIS — Z951 Presence of aortocoronary bypass graft: Secondary | ICD-10-CM | POA: Diagnosis not present

## 2023-08-31 DIAGNOSIS — M5135 Other intervertebral disc degeneration, thoracolumbar region: Secondary | ICD-10-CM | POA: Diagnosis not present

## 2023-08-31 DIAGNOSIS — I2510 Atherosclerotic heart disease of native coronary artery without angina pectoris: Secondary | ICD-10-CM | POA: Diagnosis not present

## 2023-08-31 DIAGNOSIS — E1151 Type 2 diabetes mellitus with diabetic peripheral angiopathy without gangrene: Secondary | ICD-10-CM | POA: Diagnosis not present

## 2023-09-01 ENCOUNTER — Encounter: Payer: Self-pay | Admitting: Podiatry

## 2023-09-01 ENCOUNTER — Ambulatory Visit (INDEPENDENT_AMBULATORY_CARE_PROVIDER_SITE_OTHER): Payer: Medicare Other | Admitting: Podiatry

## 2023-09-01 DIAGNOSIS — M79674 Pain in right toe(s): Secondary | ICD-10-CM

## 2023-09-01 DIAGNOSIS — M79675 Pain in left toe(s): Secondary | ICD-10-CM

## 2023-09-01 DIAGNOSIS — Q6671 Congenital pes cavus, right foot: Secondary | ICD-10-CM

## 2023-09-01 DIAGNOSIS — B351 Tinea unguium: Secondary | ICD-10-CM | POA: Diagnosis not present

## 2023-09-01 DIAGNOSIS — M2042 Other hammer toe(s) (acquired), left foot: Secondary | ICD-10-CM

## 2023-09-01 DIAGNOSIS — L84 Corns and callosities: Secondary | ICD-10-CM | POA: Diagnosis not present

## 2023-09-01 DIAGNOSIS — M2041 Other hammer toe(s) (acquired), right foot: Secondary | ICD-10-CM

## 2023-09-01 DIAGNOSIS — I739 Peripheral vascular disease, unspecified: Secondary | ICD-10-CM | POA: Diagnosis not present

## 2023-09-01 DIAGNOSIS — Q6672 Congenital pes cavus, left foot: Secondary | ICD-10-CM

## 2023-09-01 DIAGNOSIS — E1042 Type 1 diabetes mellitus with diabetic polyneuropathy: Secondary | ICD-10-CM

## 2023-09-01 NOTE — Progress Notes (Signed)
 Subjective:  Patient ID: Natasha Hernandez, female    DOB: [birth year],  MRN: [id number]  Natasha Hernandez presents to clinic today for at risk footcare. Patient has h/o diabetes, neuropathy and PAD and is seen for  and callus(es) of both feet and painful thick toenails that are difficult to trim. Painful toenails interfere with ambulation. Aggravating factors include wearing enclosed shoe gear. Pain is relieved with periodic professional debridement. Painful calluses are aggravated when weightbearing with and without shoegear. Pain is relieved with periodic professional debridement. She is accompanied by her daughter on today's visit. Chief Complaint  Patient presents with   Diabetes    "Diabetic foot check"   New problem(s): None.   PCP is Nche, Connye Delaine, NP.  No Known Allergies  Review of Systems: Negative except as noted in the HPI.  Objective: No changes noted in today's physical examination. There were no vitals filed for this visit. Natasha Hernandez is a pleasant 83 y.o. female WD, WN in NAD. AAO x 3.  Vascular Examination: CFT <3 seconds b/l LE. Faintly palpable pedal pulses b/l. Pedal hair absent. No pain with calf compression b/l. No edema noted b/l LE. No ischemia or gangrene noted b/l LE. No cyanosis or clubbing noted b/l LE.  Dermatological Examination: Pedal skin thin, shiny and atrophic b/l LE. No interdigital macerations noted b/l LE. Toenails 1-5 b/l elongated, discolored, dystrophic, thickened, crumbly with subungual debris and tenderness to dorsal palpation. Hyperkeratotic lesion(s) submet head 2 left foot and submet head 5 right foot.  No erythema, no edema, no drainage, no fluctuance.  Musculoskeletal: Muscle strength 5/5 to all lower extremity muscle groups bilaterally. Pes cavus deformity noted b/l lower extremities. Severe hammertoe deformity noted 1-5 bilaterally.  Neurological: Protective sensation diminished with 10g monofilament  b/l.  Assessment/Plan: 1. Pain due to onychomycosis of toenails of both feet   2. Callus   3. PAD (peripheral artery disease) (HCC)   4. Acquired hammertoes of both feet   5. Pes cavus of both feet   6. 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)    Orders Placed This Encounter  Procedures   For Home Use Only DME Custom Orthotics    Dispense 3 pair custom diabetic insoles. Offload calluses submet head 5 right foot and submet head 2 left foot.   Consent given for treatment. Patient examined.All patient's and/or POA's questions/concerns addressed on today's visit. Toenails 1-5 debrided in length and girth without incident. Callus(es) submet head 2 left foot and submet head 5 right foot pared with sharp debridement without incident. Continue foot and shoe inspections daily. Monitor blood glucose per PCP/Endocrinologist's recommendations.Continue soft, supportive shoe gear daily. Report any pedal injuries to medical professional. Call office if there are any questions/concerns. Will check with Kerney Pee in Orthotics and Prosthetics to see if she can be casted for diabetic insoles only as she purchases her shoes elsewhere.  Return in about 10 weeks (around 11/10/2023).  Natasha Hernandez, DPM      [place] LOCATION: [address] [place] [address]                   Office [phone]   [place] LOCATION: [phone] [address]  [place], [place] [address] Office [phone]

## 2023-09-07 DIAGNOSIS — M5135 Other intervertebral disc degeneration, thoracolumbar region: Secondary | ICD-10-CM | POA: Diagnosis not present

## 2023-09-07 DIAGNOSIS — E1151 Type 2 diabetes mellitus with diabetic peripheral angiopathy without gangrene: Secondary | ICD-10-CM | POA: Diagnosis not present

## 2023-09-07 DIAGNOSIS — E1142 Type 2 diabetes mellitus with diabetic polyneuropathy: Secondary | ICD-10-CM | POA: Diagnosis not present

## 2023-09-07 DIAGNOSIS — I2510 Atherosclerotic heart disease of native coronary artery without angina pectoris: Secondary | ICD-10-CM | POA: Diagnosis not present

## 2023-09-07 DIAGNOSIS — Z794 Long term (current) use of insulin: Secondary | ICD-10-CM | POA: Diagnosis not present

## 2023-09-07 DIAGNOSIS — I10 Essential (primary) hypertension: Secondary | ICD-10-CM | POA: Diagnosis not present

## 2023-09-13 ENCOUNTER — Ambulatory Visit

## 2023-09-13 DIAGNOSIS — L84 Corns and callosities: Secondary | ICD-10-CM

## 2023-09-13 DIAGNOSIS — E1042 Type 1 diabetes mellitus with diabetic polyneuropathy: Secondary | ICD-10-CM

## 2023-09-13 NOTE — Progress Notes (Signed)
 Currently wearing sz 7 DM shoe / Drew Balance shoe synthetic upper.  New DM inserts needed patient impressions and scans taken today  Offloads will be made to accommodate bony areas of BIL feet along with BIL MT pads will be added as well

## 2023-09-14 DIAGNOSIS — I2510 Atherosclerotic heart disease of native coronary artery without angina pectoris: Secondary | ICD-10-CM | POA: Diagnosis not present

## 2023-09-14 DIAGNOSIS — Z794 Long term (current) use of insulin: Secondary | ICD-10-CM | POA: Diagnosis not present

## 2023-09-14 DIAGNOSIS — E1142 Type 2 diabetes mellitus with diabetic polyneuropathy: Secondary | ICD-10-CM | POA: Diagnosis not present

## 2023-09-14 DIAGNOSIS — E1151 Type 2 diabetes mellitus with diabetic peripheral angiopathy without gangrene: Secondary | ICD-10-CM | POA: Diagnosis not present

## 2023-09-14 DIAGNOSIS — I10 Essential (primary) hypertension: Secondary | ICD-10-CM | POA: Diagnosis not present

## 2023-09-14 DIAGNOSIS — M5135 Other intervertebral disc degeneration, thoracolumbar region: Secondary | ICD-10-CM | POA: Diagnosis not present

## 2023-09-22 DIAGNOSIS — M5135 Other intervertebral disc degeneration, thoracolumbar region: Secondary | ICD-10-CM | POA: Diagnosis not present

## 2023-09-22 DIAGNOSIS — E1142 Type 2 diabetes mellitus with diabetic polyneuropathy: Secondary | ICD-10-CM | POA: Diagnosis not present

## 2023-09-22 DIAGNOSIS — E1151 Type 2 diabetes mellitus with diabetic peripheral angiopathy without gangrene: Secondary | ICD-10-CM | POA: Diagnosis not present

## 2023-09-22 DIAGNOSIS — I10 Essential (primary) hypertension: Secondary | ICD-10-CM | POA: Diagnosis not present

## 2023-09-22 DIAGNOSIS — I2510 Atherosclerotic heart disease of native coronary artery without angina pectoris: Secondary | ICD-10-CM | POA: Diagnosis not present

## 2023-09-22 DIAGNOSIS — Z794 Long term (current) use of insulin: Secondary | ICD-10-CM | POA: Diagnosis not present

## 2023-09-27 DIAGNOSIS — Z794 Long term (current) use of insulin: Secondary | ICD-10-CM | POA: Diagnosis not present

## 2023-09-27 DIAGNOSIS — M5135 Other intervertebral disc degeneration, thoracolumbar region: Secondary | ICD-10-CM | POA: Diagnosis not present

## 2023-09-27 DIAGNOSIS — I10 Essential (primary) hypertension: Secondary | ICD-10-CM | POA: Diagnosis not present

## 2023-09-27 DIAGNOSIS — E1151 Type 2 diabetes mellitus with diabetic peripheral angiopathy without gangrene: Secondary | ICD-10-CM | POA: Diagnosis not present

## 2023-09-27 DIAGNOSIS — I2510 Atherosclerotic heart disease of native coronary artery without angina pectoris: Secondary | ICD-10-CM | POA: Diagnosis not present

## 2023-09-27 DIAGNOSIS — E1142 Type 2 diabetes mellitus with diabetic polyneuropathy: Secondary | ICD-10-CM | POA: Diagnosis not present

## 2023-09-29 ENCOUNTER — Telehealth: Payer: Self-pay

## 2023-09-29 NOTE — Telephone Encounter (Signed)
Left VM to rtn call. Dm/cma       

## 2023-09-29 NOTE — Telephone Encounter (Signed)
 Copied from CRM [id number]. Topic: Clinical - Home Health Verbal Orders >> Sep 27, 2023  1:35 PM Allyne Areola wrote: Caller/Agency:  [facility] Callback Number: [phone] Service Requested: Physical Therapy Frequency: 1 time a week for 8 weeks. Any new concerns about the patient? No

## 2023-09-30 DIAGNOSIS — M5135 Other intervertebral disc degeneration, thoracolumbar region: Secondary | ICD-10-CM | POA: Diagnosis not present

## 2023-09-30 DIAGNOSIS — Z556 Problems related to health literacy: Secondary | ICD-10-CM | POA: Diagnosis not present

## 2023-09-30 DIAGNOSIS — E1151 Type 2 diabetes mellitus with diabetic peripheral angiopathy without gangrene: Secondary | ICD-10-CM | POA: Diagnosis not present

## 2023-09-30 DIAGNOSIS — Z7982 Long term (current) use of aspirin: Secondary | ICD-10-CM | POA: Diagnosis not present

## 2023-09-30 DIAGNOSIS — Z604 Social exclusion and rejection: Secondary | ICD-10-CM | POA: Diagnosis not present

## 2023-09-30 DIAGNOSIS — E1142 Type 2 diabetes mellitus with diabetic polyneuropathy: Secondary | ICD-10-CM | POA: Diagnosis not present

## 2023-09-30 DIAGNOSIS — I2510 Atherosclerotic heart disease of native coronary artery without angina pectoris: Secondary | ICD-10-CM | POA: Diagnosis not present

## 2023-09-30 DIAGNOSIS — Z951 Presence of aortocoronary bypass graft: Secondary | ICD-10-CM | POA: Diagnosis not present

## 2023-09-30 DIAGNOSIS — I10 Essential (primary) hypertension: Secondary | ICD-10-CM | POA: Diagnosis not present

## 2023-09-30 DIAGNOSIS — Z794 Long term (current) use of insulin: Secondary | ICD-10-CM | POA: Diagnosis not present

## 2023-09-30 DIAGNOSIS — H9190 Unspecified hearing loss, unspecified ear: Secondary | ICD-10-CM | POA: Diagnosis not present

## 2023-10-03 NOTE — Telephone Encounter (Signed)
Left VM to rtn call. Dm/cma       

## 2023-10-05 ENCOUNTER — Telehealth: Payer: Self-pay | Admitting: Nurse Practitioner

## 2023-10-05 NOTE — Telephone Encounter (Signed)
 Patient dropped off document Home Health Certificate (Order ID [id number]), to be filled out by provider. Patient requested to send it back via Fax within 7-days. Document is located in providers tray at front office.Please advise at [phone]

## 2023-10-07 NOTE — Telephone Encounter (Signed)
CLINICAL USE BELOW THIS LINE (use X to signify taken)  __X__Form received and placed in providers office for signature. ____Form completed and faxed to [facility] Dept. ____Form completed & LVM to notify pt ready for pick up ____Charge sheet & copy of form in front office folder for office supervisor.   

## 2023-10-11 ENCOUNTER — Ambulatory Visit: Admitting: Nurse Practitioner

## 2023-10-12 DIAGNOSIS — E1151 Type 2 diabetes mellitus with diabetic peripheral angiopathy without gangrene: Secondary | ICD-10-CM | POA: Diagnosis not present

## 2023-10-12 DIAGNOSIS — E1142 Type 2 diabetes mellitus with diabetic polyneuropathy: Secondary | ICD-10-CM | POA: Diagnosis not present

## 2023-10-12 DIAGNOSIS — Z951 Presence of aortocoronary bypass graft: Secondary | ICD-10-CM | POA: Diagnosis not present

## 2023-10-12 DIAGNOSIS — Z794 Long term (current) use of insulin: Secondary | ICD-10-CM | POA: Diagnosis not present

## 2023-10-12 DIAGNOSIS — M5135 Other intervertebral disc degeneration, thoracolumbar region: Secondary | ICD-10-CM | POA: Diagnosis not present

## 2023-10-12 DIAGNOSIS — Z556 Problems related to health literacy: Secondary | ICD-10-CM | POA: Diagnosis not present

## 2023-10-12 DIAGNOSIS — I10 Essential (primary) hypertension: Secondary | ICD-10-CM | POA: Diagnosis not present

## 2023-10-12 DIAGNOSIS — I2510 Atherosclerotic heart disease of native coronary artery without angina pectoris: Secondary | ICD-10-CM | POA: Diagnosis not present

## 2023-10-12 DIAGNOSIS — Z7982 Long term (current) use of aspirin: Secondary | ICD-10-CM | POA: Diagnosis not present

## 2023-10-12 DIAGNOSIS — Z604 Social exclusion and rejection: Secondary | ICD-10-CM | POA: Diagnosis not present

## 2023-10-12 DIAGNOSIS — H9190 Unspecified hearing loss, unspecified ear: Secondary | ICD-10-CM | POA: Diagnosis not present

## 2023-10-13 ENCOUNTER — Ambulatory Visit (INDEPENDENT_AMBULATORY_CARE_PROVIDER_SITE_OTHER): Admitting: Endocrinology

## 2023-10-13 ENCOUNTER — Encounter: Payer: Self-pay | Admitting: Endocrinology

## 2023-10-13 ENCOUNTER — Ambulatory Visit: Payer: Self-pay | Admitting: Endocrinology

## 2023-10-13 VITALS — BP 126/60 | HR 72 | Resp 20 | Ht 64.0 in | Wt 162.2 lb

## 2023-10-13 DIAGNOSIS — E1142 Type 2 diabetes mellitus with diabetic polyneuropathy: Secondary | ICD-10-CM | POA: Diagnosis not present

## 2023-10-13 DIAGNOSIS — E1151 Type 2 diabetes mellitus with diabetic peripheral angiopathy without gangrene: Secondary | ICD-10-CM | POA: Diagnosis not present

## 2023-10-13 DIAGNOSIS — E1065 Type 1 diabetes mellitus with hyperglycemia: Secondary | ICD-10-CM

## 2023-10-13 DIAGNOSIS — M5135 Other intervertebral disc degeneration, thoracolumbar region: Secondary | ICD-10-CM | POA: Diagnosis not present

## 2023-10-13 DIAGNOSIS — Z794 Long term (current) use of insulin: Secondary | ICD-10-CM | POA: Diagnosis not present

## 2023-10-13 DIAGNOSIS — I2510 Atherosclerotic heart disease of native coronary artery without angina pectoris: Secondary | ICD-10-CM | POA: Diagnosis not present

## 2023-10-13 DIAGNOSIS — I10 Essential (primary) hypertension: Secondary | ICD-10-CM | POA: Diagnosis not present

## 2023-10-13 LAB — POCT GLYCOSYLATED HEMOGLOBIN (HGB A1C): Hemoglobin A1C: 8 % — AB (ref 4.0–5.6)

## 2023-10-13 NOTE — Progress Notes (Addendum)
 Outpatient Endocrinology Note [place] Clayten Allcock, MD  10/13/23  Patient's Name: Natasha Hernandez    DOB: 09/05/1940    MRN: [id number]                                                    REASON OF VISIT: Follow up for type 1 diabetes mellitus  PCP: Nche, Roselie Rockford, NP  HISTORY OF PRESENT ILLNESS:   Natasha Hernandez is a 83 y.o. old female with past medical history listed below, is here for follow up for type 1 diabetes mellitus.   Pertinent Diabetes History: Patient was diagnosed with type 1 diabetes mellitus in 1953.  Patient had been on various insulin  regimen over the years.  At this time she has been on OmniPod 5 insulin  pump with Dexcom G6 CGM since November 2022.  Patient's daughter-in-law is supervising for managing the insulin  pump at home.  Chronic Diabetes Complications : Retinopathy: yes.  Has proliferative but a stable retinopathy, regularly following with ophthalmology. Nephropathy: CKD, on lisinopril  Peripheral neuropathy: yes, followed up with podiatry. Coronary artery disease: yes Stroke: no  Relevant comorbidities and cardiovascular risk factors: Obesity: no Body mass index is 27.84 kg/m.  Hypertension: yes Hyperlipidemia. Yes, on statin.   Current / Home Diabetic regimen includes:  OmniPod 5 insulin  pump with Dexcom G6 CGM, using Admelog  U100  Insulin  Pump setting:  Basal ( 18.1 units / day) MN- 0.7 u/hour  2:30AM-0.6  6AM- 0.8 3PM- 0.8 6PM-   0.85 9PM-   0.7  Bolus CHO Ratio (1unit:CHO) MN- 1:8 11AM- 1:8 3PM-    1:8  Correction/Sensitivity: MN- 1:35  Target: 120-150    Active insulin  time: 2.5 hours  Prior diabetic medications:  CONTINUOUS GLUCOSE MONITORING SYSTEM (CGMS) / INSULIN  PUMP INTERPRETATION:                         OmniPod 5 Pump & Sensor Download (Reviewed and summarized below.) Dates: June 13 to June 26 , 2025 last 14 days  Glucose Management Indicator: 8.3%  CGM/Sensor usage: 95 %   Average total daily insulin :  26 units, Basal: 67%, Bolus: 33%.  Automated mode in Use: 17%, Manual mode 83%     Trends: Frequent hyperglycemia postprandially with blood sugar in the range up to 200 - 300.  Hyperglycemia related to not enough meal bolus, late meal bolus and at times no meal bolus.  Blood sugar trending down and in the normal range in between the meals with the use of meal boluses and basal rates.  No hypoglycemia.  She has been majority of the time on manual mode.  Hypoglycemia: Patient has no hypoglycemic episodes. Patient has hypoglycemia awareness.    Factors modifying glucose control: 1.  Diabetic diet assessment: Usually 3 meals a day and sometimes related to bedtime snack.   Snack.  2.  Staying active or exercising: Walking.  3.  Medication compliance: compliant all of the time.  Interval history  Pump and CGM data as reviewed above.  Hemoglobin A1c slightly improved to 8%.  She is accompanied by daughter-in-law in the clinic today.  No other complaints.  She has been mostly using manual mode.  On the automated mode patient usually gets a lot of alarms and patient also not able to change back to automated mode.  For the convenience patient likes to use on manual mode.  REVIEW OF SYSTEMS As per history of present illness.   PAST MEDICAL HISTORY: Past Medical History:  Diagnosis Date   AKI (acute kidney injury) (HCC) 04/16/2018   Carotid artery occlusion    Coronary artery disease    CABG in 1996. Most recent cardiac catheterization in 2013 showed patent grafts including LIMA to LAD, SVG to OM, SVG to diagonal and SVG to PDA.   Diabetes mellitus without complication (HCC)    Hyperlipidemia    Hypertension    PAD (peripheral artery disease) (HCC)    Pain in finger of right hand 04/13/2016   Pneumonia due to COVID-19 virus 03/27/2019    PAST SURGICAL HISTORY: Past Surgical History:  Procedure Laterality Date   CARDIAC CATHETERIZATION     CORONARY ARTERY BYPASS GRAFT  1996   EYE  SURGERY      ALLERGIES: No Known Allergies  FAMILY HISTORY:  Family History  Problem Relation Age of Onset   Breast cancer Mother 73   Stroke Mother    Emphysema Father 9   Heart attack Father     SOCIAL HISTORY: Social History   Socioeconomic History   Marital status: Widowed    Spouse name: Not on file   Number of children: Not on file   Years of education: Not on file   Highest education level: Bachelor's degree (e.g., BA, AB, BS)  Occupational History   Occupation: Retired  Tobacco Use   Smoking status: Never   Smokeless tobacco: Never  Vaping Use   Vaping status: Never Used  Substance and Sexual Activity   Alcohol use: No   Drug use: Never   Sexual activity: Not on file  Other Topics Concern   Not on file  Social History Narrative   Pt lives w/ son, dtr-in-law and grandchildren.    Social Drivers of Corporate investment banker Strain: Low Risk  (10/10/2023)   Overall Financial Resource Strain (CARDIA)    Difficulty of Paying Living Expenses: Not hard at all  Food Insecurity: No Food Insecurity (10/10/2023)   Hunger Vital Sign    Worried About Running Out of Food in the Last Year: Never true    Ran Out of Food in the Last Year: Never true  Transportation Needs: No Transportation Needs (10/10/2023)   PRAPARE - Administrator, Civil Service (Medical): No    Lack of Transportation (Non-Medical): No  Physical Activity: Insufficiently Active (10/10/2023)   Exercise Vital Sign    Days of Exercise per Week: 3 days    Minutes of Exercise per Session: 10 min  Stress: No Stress Concern Present (10/10/2023)   [employer] of Occupational Health - Occupational Stress Questionnaire    Feeling of Stress: Not at all  Social Connections: Moderately Integrated (10/10/2023)   Social Connection and Isolation Panel    Frequency of Communication with Friends and Family: Twice a week    Frequency of Social Gatherings with Friends and Family: Once a week     Attends Religious Services: More than 4 times per year    Active Member of [employer] or Organizations: Yes    Attends Banker Meetings: More than 4 times per year    Marital Status: Widowed    MEDICATIONS:  Current Outpatient Medications  Medication Sig Dispense Refill   aspirin  EC 81 MG tablet Take 1 tablet (81 mg total) by mouth daily. 90 tablet 3   Calcium -Magnesium-Vitamin D (CALCIUM   MAGNESIUM PO) Take 1 tablet by mouth daily.     carvedilol  (COREG ) 6.25 MG tablet Take 1 tablet (6.25 mg total) by mouth 2 (two) times daily with a meal. 180 tablet 3   Coenzyme Q10 (CO Q 10 PO) Take 1 capsule by mouth daily.     Continuous Blood Gluc Sensor (DEXCOM G6 SENSOR) MISC 1 each by Does not apply route See admin instructions. Use one sensor once every 10 days to monitor blood sugars. 3 each 0   Continuous Blood Gluc Transmit (DEXCOM G6 TRANSMITTER) MISC 1 each by Does not apply route See admin instructions. Use one transmitter once every 90 days to transmit blood sugars.     ezetimibe  (ZETIA ) 10 MG tablet Take 1 tablet by mouth daily. 90 tablet 3   gentamicin  cream (GARAMYCIN ) 0.1 % Apply 1 Application topically 2 (two) times daily. 30 g 1   Glucagon  (GVOKE HYPOPEN  1-PACK) 1 MG/0.2ML SOAJ Inject 1 mg into the skin as needed (low blood sugar with impaired consciousness). 0.4 mL 2   glucose blood test strip Use Contour Next test strips as instructed to check blood sugar 4 times daily. DX:E10.65 150 each 1   Insulin  Disposable Pump (OMNIPOD 5 DEXG7G6 PODS GEN 5) MISC APPLY EVERY 3 DAYS 30 each 3   Insulin  Disposable Pump (OMNIPOD 5 G6 INTRO, GEN 5,) KIT See admin instructions.     insulin  lispro (ADMELOG ) 100 UNIT/ML injection INJECT 40 UNITS INTO THE SKIN DAILY. VIA PUMP 40 mL 1   isosorbide  mononitrate (IMDUR ) 30 MG 24 hr tablet Take 1 tablet (30 mg total) by mouth 2 (two) times daily. 60 tablet 11   lisinopril  (ZESTRIL ) 10 MG tablet Take 1 tablet (10 mg total) by mouth daily. 90 tablet 1    Multiple Vitamin (MULTIVITAMIN) tablet Take 1 tablet by mouth daily.     rosuvastatin  (CRESTOR ) 40 MG tablet Take 1 tablet by mouth daily. 90 tablet 3   No current facility-administered medications for this visit.    PHYSICAL EXAM: Vitals:   10/13/23 1044  BP: 126/60  Pulse: 72  Resp: 20  SpO2: 97%  Weight: 162 lb 3.2 oz (73.6 kg)  Height: 5' 4 (1.626 m)     Body mass index is 27.84 kg/m.  Wt Readings from Last 3 Encounters:  10/13/23 162 lb 3.2 oz (73.6 kg)  07/18/23 167 lb (75.8 kg)  07/05/23 167 lb 3.2 oz (75.8 kg)    General: Well developed, well nourished female in no apparent distress.  HEENT: AT/[place], no external lesions.  Eyes: Conjunctiva clear and no icterus. Neck: Neck supple  Lungs: Respirations not labored Neurologic: Alert, oriented, normal speech Extremities / Skin: Dry.  Psychiatric: Does not appear depressed or anxious  Diabetic Foot Exam - Simple   No data filed     LABS Reviewed Lab Results  Component Value Date   HGBA1C 8.0 (A) 10/13/2023   HGBA1C 8.5 (A) 07/18/2023   HGBA1C 9.8 (A) 04/18/2023   No results found for: FRUCTOSAMINE Lab Results  Component Value Date   CHOL 126 12/24/2022   HDL 75.70 12/24/2022   LDLCALC 36 12/24/2022   TRIG 70.0 12/24/2022   CHOLHDL 2 12/24/2022   Lab Results  Component Value Date   MICRALBCREAT 1.3 09/17/2022   MICRALBCREAT 0.7 09/05/2020   Lab Results  Component Value Date   CREATININE 0.94 12/24/2022   Lab Results  Component Value Date   GFR 57.21 (L) 12/24/2022    ASSESSMENT / PLAN  1.  Uncontrolled type 1 diabetes mellitus with hyperglycemia (HCC)    Diabetes Mellitus type 1, complicated by diabetic retinopathy/neuropathy/CKD. - Diabetic status / severity: Uncontrolled.  Improving.  Lab Results  Component Value Date   HGBA1C 8.0 (A) 10/13/2023    - Hemoglobin A1c goal <7.5%   Diabetes control slightly improving.    Patient has uncontrolled type 1 diabetes mellitus.  She has  mostly hyperglycemia related with meals postprandially with inadequate mealtime bolus insulin  via pump.  Patient is advised to use automatic mode as much as possible.  Advised to meal bolus 10 to 15 minutes before meals and for all the meals.  Mostly postprandial hyperglycemia.  Changed pump setting as follows.  - Medications:   OmniPod 5 insulin  pump with Dexcom G6 CGM, using Admelog  U100  Insulin  Pump setting:  Basal ( 18.1 units / day) MN- 0.7 u/hour  2:30AM-0.6  6AM- 0.8 3PM- 0.8 6PM-   0.85 9PM-   0.7  Bolus CHO Ratio (1unit:CHO) MN- 1:8 11AM- 1:8 3PM-    1:8  Correction/Sensitivity: MN- 1:35, changed to 1:30  Target: 120-150    Active insulin  time: 2.5 hours  OmniPod 5 insulin  pump with Dexcom G6 CGM  - Home glucose testing: continue CGM and check blood glucose as needed.  - Discussed/ Gave Hypoglycemia treatment plan.  Has Glucagon  Emergency Kit at home.  # Consult : not required at this time.   # Annual urine for microalbuminuria/ creatinine ratio, no microalbuminuria currently, continue ACE/ARB /lisinopril .  Will check today. Last  Lab Results  Component Value Date   MICRALBCREAT 1.3 09/17/2022    # Foot check nightly / neuropathy.  # Has diabetic retinopathy, regular follow-up with ophthalmology.  - Diet: Make healthy diabetic food choices - Life style / activity / exercise: Discussed.  2. Blood pressure  -  BP Readings from Last 1 Encounters:  10/13/23 126/60    - Control is in target.  - No change in current plans.  3. Lipid status / Hyperlipidemia - Last  Lab Results  Component Value Date   LDLCALC 36 12/24/2022   - Continue rosuvastatin  40 mg daily.  Zetia  10 mg daily.  Diagnoses and all orders for this visit:  Uncontrolled type 1 diabetes mellitus with hyperglycemia (HCC) -     POCT glycosylated hemoglobin (Hb A1C)     DISPOSITION Follow up in clinic in 3 months suggested.   All questions answered and patient verbalized  understanding of the plan.  [place] Zaynah Chawla, MD [facility] [address] [place], [address] [place], [place] [address] Phone # [phone]  At least part of this note was generated using voice recognition software. Inadvertent word errors may have occurred, which were not recognized during the proofreading process.

## 2023-10-14 LAB — MICROALBUMIN / CREATININE URINE RATIO
Creatinine, Urine: 202 mg/dL (ref 20–275)
Microalb Creat Ratio: 13 mg/g{creat} (ref <30)
Microalb, Ur: 2.7 mg/dL

## 2023-10-14 NOTE — Telephone Encounter (Signed)
 CLINICAL USE BELOW THIS LINE (use X to signify taken)  ____Form received and placed in providers office for signature. __X__Form completed and faxed to [facility] ____ Form completed & LVM to notify pt ready for pick up __X__Charge sheet & copy of form in front office folder for office supervisor.

## 2023-10-19 DIAGNOSIS — Z794 Long term (current) use of insulin: Secondary | ICD-10-CM | POA: Diagnosis not present

## 2023-10-19 DIAGNOSIS — I2510 Atherosclerotic heart disease of native coronary artery without angina pectoris: Secondary | ICD-10-CM | POA: Diagnosis not present

## 2023-10-19 DIAGNOSIS — M5135 Other intervertebral disc degeneration, thoracolumbar region: Secondary | ICD-10-CM | POA: Diagnosis not present

## 2023-10-19 DIAGNOSIS — E1142 Type 2 diabetes mellitus with diabetic polyneuropathy: Secondary | ICD-10-CM | POA: Diagnosis not present

## 2023-10-19 DIAGNOSIS — I10 Essential (primary) hypertension: Secondary | ICD-10-CM | POA: Diagnosis not present

## 2023-10-19 DIAGNOSIS — E1151 Type 2 diabetes mellitus with diabetic peripheral angiopathy without gangrene: Secondary | ICD-10-CM | POA: Diagnosis not present

## 2023-10-26 ENCOUNTER — Other Ambulatory Visit: Payer: Self-pay | Admitting: Endocrinology

## 2023-10-26 DIAGNOSIS — E1065 Type 1 diabetes mellitus with hyperglycemia: Secondary | ICD-10-CM

## 2023-10-26 DIAGNOSIS — Z794 Long term (current) use of insulin: Secondary | ICD-10-CM | POA: Diagnosis not present

## 2023-10-26 DIAGNOSIS — M5135 Other intervertebral disc degeneration, thoracolumbar region: Secondary | ICD-10-CM | POA: Diagnosis not present

## 2023-10-26 DIAGNOSIS — E1142 Type 2 diabetes mellitus with diabetic polyneuropathy: Secondary | ICD-10-CM | POA: Diagnosis not present

## 2023-10-26 DIAGNOSIS — I10 Essential (primary) hypertension: Secondary | ICD-10-CM | POA: Diagnosis not present

## 2023-10-26 DIAGNOSIS — E1151 Type 2 diabetes mellitus with diabetic peripheral angiopathy without gangrene: Secondary | ICD-10-CM | POA: Diagnosis not present

## 2023-10-26 DIAGNOSIS — I2510 Atherosclerotic heart disease of native coronary artery without angina pectoris: Secondary | ICD-10-CM | POA: Diagnosis not present

## 2023-10-26 NOTE — Telephone Encounter (Signed)
 Refill request complete

## 2023-10-30 DIAGNOSIS — Z951 Presence of aortocoronary bypass graft: Secondary | ICD-10-CM | POA: Diagnosis not present

## 2023-10-30 DIAGNOSIS — Z604 Social exclusion and rejection: Secondary | ICD-10-CM | POA: Diagnosis not present

## 2023-10-30 DIAGNOSIS — I2510 Atherosclerotic heart disease of native coronary artery without angina pectoris: Secondary | ICD-10-CM | POA: Diagnosis not present

## 2023-10-30 DIAGNOSIS — M5135 Other intervertebral disc degeneration, thoracolumbar region: Secondary | ICD-10-CM | POA: Diagnosis not present

## 2023-10-30 DIAGNOSIS — E1151 Type 2 diabetes mellitus with diabetic peripheral angiopathy without gangrene: Secondary | ICD-10-CM | POA: Diagnosis not present

## 2023-10-30 DIAGNOSIS — I10 Essential (primary) hypertension: Secondary | ICD-10-CM | POA: Diagnosis not present

## 2023-10-30 DIAGNOSIS — Z556 Problems related to health literacy: Secondary | ICD-10-CM | POA: Diagnosis not present

## 2023-10-30 DIAGNOSIS — Z7982 Long term (current) use of aspirin: Secondary | ICD-10-CM | POA: Diagnosis not present

## 2023-10-30 DIAGNOSIS — E1142 Type 2 diabetes mellitus with diabetic polyneuropathy: Secondary | ICD-10-CM | POA: Diagnosis not present

## 2023-10-30 DIAGNOSIS — H9190 Unspecified hearing loss, unspecified ear: Secondary | ICD-10-CM | POA: Diagnosis not present

## 2023-10-30 DIAGNOSIS — Z794 Long term (current) use of insulin: Secondary | ICD-10-CM | POA: Diagnosis not present

## 2023-10-31 ENCOUNTER — Ambulatory Visit (INDEPENDENT_AMBULATORY_CARE_PROVIDER_SITE_OTHER): Admitting: Nurse Practitioner

## 2023-10-31 ENCOUNTER — Ambulatory Visit: Payer: Self-pay | Admitting: Nurse Practitioner

## 2023-10-31 ENCOUNTER — Encounter: Payer: Self-pay | Admitting: Nurse Practitioner

## 2023-10-31 VITALS — BP 120/66 | HR 80 | Temp 98.0°F | Ht 64.0 in | Wt 162.8 lb

## 2023-10-31 DIAGNOSIS — E785 Hyperlipidemia, unspecified: Secondary | ICD-10-CM | POA: Diagnosis not present

## 2023-10-31 DIAGNOSIS — E1069 Type 1 diabetes mellitus with other specified complication: Secondary | ICD-10-CM | POA: Diagnosis not present

## 2023-10-31 DIAGNOSIS — I10 Essential (primary) hypertension: Secondary | ICD-10-CM

## 2023-10-31 DIAGNOSIS — R2681 Unsteadiness on feet: Secondary | ICD-10-CM | POA: Diagnosis not present

## 2023-10-31 DIAGNOSIS — R413 Other amnesia: Secondary | ICD-10-CM

## 2023-10-31 DIAGNOSIS — R42 Dizziness and giddiness: Secondary | ICD-10-CM | POA: Insufficient documentation

## 2023-10-31 DIAGNOSIS — I2510 Atherosclerotic heart disease of native coronary artery without angina pectoris: Secondary | ICD-10-CM | POA: Diagnosis not present

## 2023-10-31 LAB — COMPREHENSIVE METABOLIC PANEL WITH GFR
ALT: 17 U/L (ref 0–35)
AST: 16 U/L (ref 0–37)
Albumin: 4.3 g/dL (ref 3.5–5.2)
Alkaline Phosphatase: 86 U/L (ref 39–117)
BUN: 27 mg/dL — ABNORMAL HIGH (ref 6–23)
CO2: 29 meq/L (ref 19–32)
Calcium: 9 mg/dL (ref 8.4–10.5)
Chloride: 102 meq/L (ref 96–112)
Creatinine, Ser: 0.93 mg/dL (ref 0.40–1.20)
GFR: 57.6 mL/min — ABNORMAL LOW (ref >60.00)
Glucose, Bld: 273 mg/dL — ABNORMAL HIGH (ref 70–99)
Potassium: 4.4 meq/L (ref 3.5–5.1)
Sodium: 137 meq/L (ref 135–145)
Total Bilirubin: 0.5 mg/dL (ref 0.2–1.2)
Total Protein: 6.5 g/dL (ref 6.0–8.3)

## 2023-10-31 LAB — LIPID PANEL
Cholesterol: 136 mg/dL (ref 0–200)
HDL: 71 mg/dL (ref >39.00)
LDL Cholesterol: 52 mg/dL (ref 0–99)
NonHDL: 65.12
Total CHOL/HDL Ratio: 2
Triglycerides: 66 mg/dL (ref 0.0–149.0)
VLDL: 13.2 mg/dL (ref 0.0–40.0)

## 2023-10-31 LAB — TSH: TSH: 2.7 u[IU]/mL (ref 0.35–5.50)

## 2023-10-31 LAB — CBC
HCT: 38.8 % (ref 36.0–46.0)
Hemoglobin: 12.9 g/dL (ref 12.0–15.0)
MCHC: 33.2 g/dL (ref 30.0–36.0)
MCV: 99.6 fl (ref 78.0–100.0)
Platelets: 161 K/uL (ref 150.0–400.0)
RBC: 3.89 Mil/uL (ref 3.87–5.11)
RDW: 14 % (ref 11.5–15.5)
WBC: 5.5 K/uL (ref 4.0–10.5)

## 2023-10-31 NOTE — Assessment & Plan Note (Signed)
 Repeat lipid panel. ?Maintain crestor dose ?

## 2023-10-31 NOTE — Assessment & Plan Note (Signed)
 Improved post home PT. Occasional use of a walker, but not today No fall since last OV

## 2023-10-31 NOTE — Assessment & Plan Note (Signed)
 Chronic, stable, occurs only in Am after breakfast (it takes 20mins to complete breakfast), last only a few minutes and resolves spontaneously. No palpitations or tinnitus Drinks about 4cups of coffee daily and little water intake (unknown amount)  Suspect poor hydration Advised to change position slowly, and increase water intake-at least 60oz daily.

## 2023-10-31 NOTE — Assessment & Plan Note (Signed)
 BP at goal with imdur , and lisinopril  BP Readings from Last 3 Encounters:  10/31/23 120/66  10/13/23 126/60  07/18/23 120/70    Maintain med doses Repeat CMP

## 2023-10-31 NOTE — Patient Instructions (Signed)
 Go to lab Maintain Heart healthy diet and daily exercise. Maintain current medications.

## 2023-10-31 NOTE — Assessment & Plan Note (Signed)
" >>  ASSESSMENT AND PLAN FOR MEMORY DEFICIT WRITTEN ON 10/31/2023  1:00 PM BY Isiaah Cuervo LUM, NP  MMSE completed today: 17/30 Natasha Hernandez scheduled appointment with neurology: 11/08/2023 "

## 2023-10-31 NOTE — Progress Notes (Signed)
 Established Patient Visit  Patient: Natasha Hernandez   DOB: Jul 20, 1940   83 y.o. Female  MRN: [id number] Visit Date: 10/31/2023  Subjective:    Chief Complaint  Patient presents with   Follow-up    3 month follow up  Dizzy in the mornings post breakfast Up/down at night and sleeps during the day    HPI Accompanied by daughter in law-Brandi  Essential hypertension BP at goal with imdur , and lisinopril  BP Readings from Last 3 Encounters:  10/31/23 120/66  10/13/23 126/60  07/18/23 120/70    Maintain med doses Repeat CMP  Hyperlipidemia due to type 1 diabetes mellitus (HCC) Repeat lipid panel Maintain crestor  dose  Memory deficit MMSE completed today: 17/30 Natasha Hernandez scheduled appointment with neurology: 11/08/2023  Unsteady gait Improved post home PT. Occasional use of a walker, but not today No fall since last OV  Postural dizziness Chronic, stable, occurs only in Am after breakfast (it takes 20mins to complete breakfast), last only a few minutes and resolves spontaneously. No palpitations or tinnitus Drinks about 4cups of coffee daily and little water intake (unknown amount)  Suspect poor hydration Advised to change position slowly, and increase water intake-at least 60oz daily.     10/31/2023   11:37 AM 07/05/2023    2:18 PM  MMSE - Mini Mental State Exam  Orientation to time 2 1  Orientation to Place 3 3  Registration 2 3  Attention/ Calculation 2 5  Recall 0 2  Language- name 2 objects 2 2  Language- repeat 1 1  Language- follow 3 step command 3 3  Language- read & follow direction 1 1  Write a sentence 1 1  Copy design 0 0  Total score 17 22    Reviewed medical, surgical, and social history today  Medications: Outpatient Medications Prior to Visit  Medication Sig   aspirin  EC 81 MG tablet Take 1 tablet (81 mg total) by mouth daily.   Calcium -Magnesium-Vitamin D (CALCIUM  MAGNESIUM PO) Take 1 tablet by mouth daily.    carvedilol  (COREG ) 6.25 MG tablet Take 1 tablet (6.25 mg total) by mouth 2 (two) times daily with a meal.   Coenzyme Q10 (CO Q 10 PO) Take 1 capsule by mouth daily.   Continuous Blood Gluc Sensor (DEXCOM G6 SENSOR) MISC 1 each by Does not apply route See admin instructions. Use one sensor once every 10 days to monitor blood sugars.   Continuous Blood Gluc Transmit (DEXCOM G6 TRANSMITTER) MISC 1 each by Does not apply route See admin instructions. Use one transmitter once every 90 days to transmit blood sugars.   ezetimibe  (ZETIA ) 10 MG tablet Take 1 tablet by mouth daily.   gentamicin  cream (GARAMYCIN ) 0.1 % Apply 1 Application topically 2 (two) times daily.   Glucagon  (GVOKE HYPOPEN  1-PACK) 1 MG/0.2ML SOAJ Inject 1 mg into the skin as needed (low blood sugar with impaired consciousness).   glucose blood test strip Use Contour Next test strips as instructed to check blood sugar 4 times daily. 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   insulin  lispro (ADMELOG ) 100 UNIT/ML injection INJECT 40 UNITS INTO THE SKIN DAILY. VIA PUMP   isosorbide  mononitrate (IMDUR ) 30 MG 24 hr tablet Take 1 tablet (30 mg total) by mouth 2 (  two) times daily.   lisinopril  (ZESTRIL ) 10 MG tablet Take 1 tablet by mouth daily.   Multiple Vitamin (MULTIVITAMIN) tablet Take 1 tablet by mouth daily.   rosuvastatin  (CRESTOR ) 40 MG tablet Take 1 tablet by mouth daily.   No facility-administered medications prior to visit.   Reviewed past medical and social history.   ROS per HPI above  Last CBC Lab Results  Component Value Date   WBC 5.5 10/31/2023   HGB 12.9 10/31/2023   HCT 38.8 10/31/2023   MCV 99.6 10/31/2023   MCH 31.9 10/24/2022   RDW 14.0 10/31/2023   PLT 161.0 10/31/2023   Last metabolic panel Lab Results  Component Value Date   GLUCOSE 273 (H) 10/31/2023   NA 137 10/31/2023    K 4.4 10/31/2023   CL 102 10/31/2023   CO2 29 10/31/2023   BUN 27 (H) 10/31/2023   CREATININE 0.93 10/31/2023   GFR 57.60 (L) 10/31/2023   CALCIUM  9.0 10/31/2023   PROT 6.5 10/31/2023   ALBUMIN 4.3 10/31/2023   BILITOT 0.5 10/31/2023   ALKPHOS 86 10/31/2023   AST 16 10/31/2023   ALT 17 10/31/2023   ANIONGAP 9 10/24/2022   Last lipids Lab Results  Component Value Date   CHOL 136 10/31/2023   HDL 71.00 10/31/2023   LDLCALC 52 10/31/2023   TRIG 66.0 10/31/2023   CHOLHDL 2 10/31/2023   Last hemoglobin A1c Lab Results  Component Value Date   HGBA1C 8.0 (A) 10/13/2023   Last thyroid  functions Lab Results  Component Value Date   TSH 2.70 10/31/2023      Objective:  BP 120/66 (BP Location: Left Arm, Patient Position: Sitting, Cuff Size: Normal)   Pulse 80   Temp 98 F (36.7 C) (Oral)   Ht 5' 4 (1.626 m)   Wt 162 lb 12.8 oz (73.8 kg)   SpO2 98%   BMI 27.94 kg/m      Physical Exam Vitals and nursing note reviewed.  Cardiovascular:     Rate and Rhythm: Normal rate and regular rhythm.     Pulses: Normal pulses.     Heart sounds: Normal heart sounds.  Pulmonary:     Effort: Pulmonary effort is normal.     Breath sounds: Normal breath sounds.  Neurological:     Mental Status: She is alert.     Comments: Oriented to person and family member. Able to follow simple commands  Psychiatric:        Attention and Perception: Attention normal.        Mood and Affect: Mood normal.        Speech: Speech normal.        Behavior: Behavior is cooperative.        Cognition and Memory: She exhibits impaired remote memory.     Results for orders placed or performed in visit on 10/31/23  Lipid panel  Result Value Ref Range   Cholesterol 136 0 - 200 mg/dL   Triglycerides 33.9 0.0 - 149.0 mg/dL   HDL 28.99 >60.99 mg/dL   VLDL 86.7 0.0 - 59.9 mg/dL   LDL Cholesterol 52 0 - 99 mg/dL   Total CHOL/HDL Ratio 2    NonHDL 65.12   CBC  Result Value Ref Range   WBC 5.5 4.0 -  10.5 K/uL   RBC 3.89 3.87 - 5.11 Mil/uL   Platelets 161.0 150.0 - 400.0 K/uL   Hemoglobin 12.9 12.0 - 15.0 g/dL   HCT 61.1 63.9 - 53.9 %  MCV 99.6 78.0 - 100.0 fl   MCHC 33.2 30.0 - 36.0 g/dL   RDW 85.9 88.4 - 84.4 %  TSH  Result Value Ref Range   TSH 2.70 0.35 - 5.50 uIU/mL  Comprehensive metabolic panel with GFR  Result Value Ref Range   Sodium 137 135 - 145 mEq/L   Potassium 4.4 3.5 - 5.1 mEq/L   Chloride 102 96 - 112 mEq/L   CO2 29 19 - 32 mEq/L   Glucose, Bld 273 (H) 70 - 99 mg/dL   BUN 27 (H) 6 - 23 mg/dL   Creatinine, Ser 9.06 0.40 - 1.20 mg/dL   Total Bilirubin 0.5 0.2 - 1.2 mg/dL   Alkaline Phosphatase 86 39 - 117 U/L   AST 16 0 - 37 U/L   ALT 17 0 - 35 U/L   Total Protein 6.5 6.0 - 8.3 g/dL   Albumin 4.3 3.5 - 5.2 g/dL   GFR 42.39 (L) >39.99 mL/min   Calcium  9.0 8.4 - 10.5 mg/dL      Assessment & Plan:    Problem List Items Addressed This Visit     CAD (coronary artery disease)   Relevant Orders   Lipid panel (Completed)   Essential hypertension (Chronic)   BP at goal with imdur , and lisinopril  BP Readings from Last 3 Encounters:  10/31/23 120/66  10/13/23 126/60  07/18/23 120/70    Maintain med doses Repeat CMP      Relevant Orders   TSH (Completed)   Comprehensive metabolic panel with GFR (Completed)   Hyperlipidemia due to type 1 diabetes mellitus (HCC)   Repeat lipid panel Maintain crestor  dose      Relevant Orders   Lipid panel (Completed)   Comprehensive metabolic panel with GFR (Completed)   Memory deficit - Primary   MMSE completed today: 17/30 Natasha Hernandez scheduled appointment with neurology: 11/08/2023      Postural dizziness   Chronic, stable, occurs only in Am after breakfast (it takes 20mins to complete breakfast), last only a few minutes and resolves spontaneously. No palpitations or tinnitus Drinks about 4cups of coffee daily and little water intake (unknown amount)  Suspect poor hydration Advised to change position  slowly, and increase water intake-at least 60oz daily.      Unsteady gait   Improved post home PT. Occasional use of a walker, but not today No fall since last OV      Other Visit Diagnoses       Dizziness       Relevant Orders   CBC (Completed)   TSH (Completed)      Return in about 6 months (around 05/02/2024) for HTN, hyperlipidemia (fasting).     Roselie Mood, NP

## 2023-10-31 NOTE — Assessment & Plan Note (Addendum)
 MMSE completed today: 17/30 Natasha Hernandez scheduled appointment with neurology: 11/08/2023

## 2023-11-02 DIAGNOSIS — E1142 Type 2 diabetes mellitus with diabetic polyneuropathy: Secondary | ICD-10-CM | POA: Diagnosis not present

## 2023-11-02 DIAGNOSIS — E1151 Type 2 diabetes mellitus with diabetic peripheral angiopathy without gangrene: Secondary | ICD-10-CM | POA: Diagnosis not present

## 2023-11-02 DIAGNOSIS — M5135 Other intervertebral disc degeneration, thoracolumbar region: Secondary | ICD-10-CM | POA: Diagnosis not present

## 2023-11-02 DIAGNOSIS — I10 Essential (primary) hypertension: Secondary | ICD-10-CM | POA: Diagnosis not present

## 2023-11-02 DIAGNOSIS — I2510 Atherosclerotic heart disease of native coronary artery without angina pectoris: Secondary | ICD-10-CM | POA: Diagnosis not present

## 2023-11-02 DIAGNOSIS — Z794 Long term (current) use of insulin: Secondary | ICD-10-CM | POA: Diagnosis not present

## 2023-11-08 ENCOUNTER — Ambulatory Visit: Admitting: Neurology

## 2023-11-08 ENCOUNTER — Telehealth: Payer: Self-pay | Admitting: Neurology

## 2023-11-08 NOTE — Telephone Encounter (Signed)
 [facility] cancellation

## 2023-11-09 DIAGNOSIS — I2510 Atherosclerotic heart disease of native coronary artery without angina pectoris: Secondary | ICD-10-CM | POA: Diagnosis not present

## 2023-11-09 DIAGNOSIS — M5135 Other intervertebral disc degeneration, thoracolumbar region: Secondary | ICD-10-CM | POA: Diagnosis not present

## 2023-11-09 DIAGNOSIS — E1151 Type 2 diabetes mellitus with diabetic peripheral angiopathy without gangrene: Secondary | ICD-10-CM | POA: Diagnosis not present

## 2023-11-09 DIAGNOSIS — Z794 Long term (current) use of insulin: Secondary | ICD-10-CM | POA: Diagnosis not present

## 2023-11-09 DIAGNOSIS — I10 Essential (primary) hypertension: Secondary | ICD-10-CM | POA: Diagnosis not present

## 2023-11-09 DIAGNOSIS — E1142 Type 2 diabetes mellitus with diabetic polyneuropathy: Secondary | ICD-10-CM | POA: Diagnosis not present

## 2023-11-10 ENCOUNTER — Encounter: Payer: Self-pay | Admitting: Podiatry

## 2023-11-10 ENCOUNTER — Ambulatory Visit (INDEPENDENT_AMBULATORY_CARE_PROVIDER_SITE_OTHER): Admitting: Podiatry

## 2023-11-10 DIAGNOSIS — L84 Corns and callosities: Secondary | ICD-10-CM

## 2023-11-10 DIAGNOSIS — E1042 Type 1 diabetes mellitus with diabetic polyneuropathy: Secondary | ICD-10-CM | POA: Diagnosis not present

## 2023-11-10 DIAGNOSIS — M79675 Pain in left toe(s): Secondary | ICD-10-CM

## 2023-11-10 DIAGNOSIS — M79674 Pain in right toe(s): Secondary | ICD-10-CM | POA: Diagnosis not present

## 2023-11-10 DIAGNOSIS — B351 Tinea unguium: Secondary | ICD-10-CM | POA: Diagnosis not present

## 2023-11-13 NOTE — Progress Notes (Signed)
 Subjective:  Patient ID: Natasha Hernandez, female    DOB: 08-26-1940,  MRN: [id number]  Natasha Hernandez presents to clinic today for at risk foot care with history of diabetic neuropathy and callus(es) right foot and painful mycotic toenails that are difficult to trim. Painful toenails interfere with ambulation. Aggravating factors include wearing enclosed shoe gear. Pain is relieved with periodic professional debridement. Painful calluses are aggravated when weightbearing with and without shoegear. Pain is relieved with periodic professional debridement. She is awaiting delivery of custom diabetic insoles next week from [place] in [place]. Chief Complaint  Patient presents with   Nail Problem    Thick painful toenails, 9 week follow up    New problem(s): None.   PCP is Nche, Roselie Rockford, NP.  No Known Allergies  Review of Systems: Negative except as noted in the HPI.  Objective: No changes noted in today's physical examination. There were no vitals filed for this visit. Natasha Hernandez is a pleasant 83 y.o. female WD, WN in NAD. AAO x 3.  Vascular Examination: CFT <3 seconds b/l. DP/PT pulses faintly palpable b/l. Digital hair absent.  Skin temperature gradient warm to warm b/l. No pain with calf compression. No ischemia or gangrene. No cyanosis or clubbing noted b/l. No edema noted b/l LE.   Neurological Examination: Protective sensation diminished with 10g monofilament b/l.  Dermatological Examination: No open wounds. No interdigital macerations.]  Pedal skin thin and atrophic b/l LE. Toenails 1-5 b/l elongated, discolored, dystrophic, thickened, crumbly with subungual debris and tenderness to dorsal palpation. Hyperkeratotic lesion(s) submet head 5 right foot.  No erythema, no edema, no drainage, no fluctuance.  Musculoskeletal Examination: Muscle strength 5/5 to all lower extremity muscle groups bilaterally. Severe hammertoe deformity noted 1-5  bilaterally. Pes cavus deformity noted b/l lower extremities.  Radiographs: None  Last A1c:      Latest Ref Rng & Units 10/13/2023   10:47 AM 07/18/2023   10:00 AM 04/18/2023   10:21 AM 12/24/2022    9:49 AM  Hemoglobin A1C  Hemoglobin-A1c 4.0 - 5.6 % 8.0  8.5  9.8  9.3    Assessment/Plan: 1. Pain due to onychomycosis of toenails of both feet   2. Callus   3. 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)     Patient was evaluated and treated. All patient's and/or POA's questions/concerns addressed on today's visit. Toenails 1-5 debrided in length and girth without incident. Callus(es) submet head 5 right foot pared with sharp debridement without incident. Continue daily foot inspections and monitor blood glucose per PCP/Endocrinologist's recommendations. Continue soft, supportive shoe gear daily. Report any pedal injuries to medical professional. Call office if there are any questions/concerns. -Patient awaiting delivery of custom diabetic insoles next week from [facility]. -Patient/POA to call should there be question/concern in the interim.   Return in about 10 weeks (around 01/19/2024).  Natasha Hernandez, DPM      [facility] LOCATION: [address], [place] [address]                   Office [phone]   [facility] LOCATION: [address] [place], [place] [address] Office [phone]

## 2023-11-16 ENCOUNTER — Ambulatory Visit

## 2023-11-16 DIAGNOSIS — E1151 Type 2 diabetes mellitus with diabetic peripheral angiopathy without gangrene: Secondary | ICD-10-CM | POA: Diagnosis not present

## 2023-11-16 DIAGNOSIS — E1142 Type 2 diabetes mellitus with diabetic polyneuropathy: Secondary | ICD-10-CM | POA: Diagnosis not present

## 2023-11-16 DIAGNOSIS — Z794 Long term (current) use of insulin: Secondary | ICD-10-CM | POA: Diagnosis not present

## 2023-11-16 DIAGNOSIS — I2510 Atherosclerotic heart disease of native coronary artery without angina pectoris: Secondary | ICD-10-CM | POA: Diagnosis not present

## 2023-11-16 DIAGNOSIS — I10 Essential (primary) hypertension: Secondary | ICD-10-CM | POA: Diagnosis not present

## 2023-11-16 DIAGNOSIS — M5135 Other intervertebral disc degeneration, thoracolumbar region: Secondary | ICD-10-CM | POA: Diagnosis not present

## 2023-11-16 NOTE — Progress Notes (Signed)
 Orthotics were ordered by mistake 3pr DM inserts on order with [facility]  Will call patient when in

## 2023-11-24 ENCOUNTER — Encounter: Payer: Self-pay | Admitting: Neurology

## 2023-11-24 ENCOUNTER — Ambulatory Visit (INDEPENDENT_AMBULATORY_CARE_PROVIDER_SITE_OTHER): Admitting: Neurology

## 2023-11-24 VITALS — BP 148/76 | HR 76 | Ht 64.0 in | Wt 163.6 lb

## 2023-11-24 DIAGNOSIS — Z8673 Personal history of transient ischemic attack (TIA), and cerebral infarction without residual deficits: Secondary | ICD-10-CM | POA: Diagnosis not present

## 2023-11-24 DIAGNOSIS — F0150 Vascular dementia without behavioral disturbance: Secondary | ICD-10-CM | POA: Diagnosis not present

## 2023-11-24 NOTE — Progress Notes (Addendum)
 Subjective:    Patient ID: Natasha Hernandez is a 83 y.o. female.  HPI    True Mar, MD, PhD [facility] [address], [address] [address] [place], [place] [address]  Dear Roselie,  I saw your patient, Natasha Hernandez, upon your kind request in my neurologic clinic today for initial consultation of her memory loss.  The patient is accompanied by her daughter-in-law and great granddaughter today.  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  Per daughter-in-law, symptoms have been ongoing for at least a year.  Daughter-in-law provides most of the history.  Patient reports that her mom had memory loss.  Daughter-in-law states that patient's mother had dementia, stroke and cancer.  Patient has 1 older sister who is 80 and lives out of state.  She does not have any memory loss as far as they know.   Patient recently finished home health PT which helped with her balance.   She has not had any recent falls.  She is a non-smoker and does not drink any alcohol, tries to hydrate well with water.  She drinks about 2 cups of coffee per day.  She does snore.  She has never had a sleep study.  She has not had a repeat brain MRI and currently is not keen on any further testing but is agreeable to some blood work today. She does nap during the day, sometimes 1 nap and sometimes more than that. She does not necessarily sleep well through the night. She no longer drives, she has not driven in a  few years.  When her husband was alive he did most of the driving. She is widowed and has been living with her son Natasha Hernandez and his family for the past 8 years.  She has 7 grandchildren and 1 infant great granddaughter who is with them today.   Her older son lives in [place]  [place]).  She initially stated that she had 1 daughter and 1 son but daughter-in-law corrected her and reminded her that she has a son in [place]  and he could not recall his name immediately without any help from the daughter-in-law. I reviewed your office note from 07/05/2023.  Her MMSE was 18 at the time.  She declined a referral to neurology at that time.  She had recent blood work which I reviewed in her electronic chart.    Another MMSE in July 2025 was 16.  She was advised to stay better hydrated and drink more water.  She had postural dizziness at the time, she had home health PT for unsteady gait.  TSH on 10/31/2023 was normal at 2.7, lipid panel showed benign findings with LDL 52, total cholesterol 863, triglycerides 66.  CBC without differential was benign, CMP showed elevated BUN at 27, creatinine 0.93, glucose elevated at 273.  Her latest A1c on 10/13/2023 was 8.0 but has improved compared to December 2024 when it was close to 10.  She had a brain MRI with and without contrast on 04/06/2019 and I reviewed the results:   IMPRESSION: 1. No acute intracranial abnormality. 2. Multifocal chronic  ischemic infarcts involving the posterior left frontoparietal region as well as the right parietal and occipital lobes. 3. 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 4. Mild age-related atrophy with chronic small vessel ischemic disease.  Her Past Medical History Is Significant For: Past Medical History:  Diagnosis Date   AKI (acute kidney injury) (HCC) 04/16/2018   Arthritis    Carotid artery occlusion    Cataract    Had cataract surgery sometime before 2017   Coronary artery  disease    CABG in 1996. Most recent cardiac catheterization in 2013 showed patent grafts including LIMA to LAD, SVG to OM, SVG to diagonal and SVG to PDA.   Diabetes mellitus without complication (HCC)    GERD (gastroesophageal reflux disease)    Hyperlipidemia    Hypertension    Neuromuscular disorder (HCC)    Osteoporosis    PAD (peripheral artery disease) (HCC)    Pain in finger of right hand 04/13/2016   Pneumonia due to COVID-19 virus 03/27/2019    Her Past Surgical History Is Significant For: Past Surgical History:  Procedure Laterality Date   BREAST SURGERY     Mastectomy before 1995   CARDIAC CATHETERIZATION     CORONARY ARTERY BYPASS GRAFT  1996   EYE SURGERY     FRACTURE SURGERY  03/2016   Broke wrist    Her Family History Is Significant For: Family History  Problem Relation Age of Onset   Breast cancer Mother 7   Stroke Mother    Emphysema Father 46   Heart attack Father    Diabetes Paternal Grandfather     Her Social History Is Significant For: Social History   Socioeconomic History   Marital status: Widowed    Spouse name: Not on file   Number of children: Not on file   Years of education: Not on file   Highest education level: Bachelor's degree (e.g., BA, AB, BS)  Occupational History   Occupation: Retired  Tobacco Use   Smoking status: Never   Smokeless tobacco: Never  Vaping Use   Vaping status: Never Used  Substance and Sexual Activity   Alcohol use: No   Drug use: Never   Sexual activity: Not on file  Other Topics Concern   Not on file  Social History Narrative   Pt lives w/ son, dtr-in-law and grandchildren.    Caffiene 2 cups daily      Social Drivers of Health   Financial Resource Strain: Low Risk  (10/10/2023)   Overall Financial Resource Strain (CARDIA)    Difficulty of Paying Living Expenses: Not hard at all  Food Insecurity: No Food Insecurity (10/10/2023)   Hunger Vital Sign    Worried About Running Out of Food in the Last  Year: Never true    Ran Out of Food in the Last Year: Never true  Transportation Needs: No Transportation Needs (10/10/2023)   PRAPARE - Administrator, Civil Service (Medical): No    Lack of Transportation (Non-Medical): No  Physical Activity: Insufficiently Active (10/10/2023)   Exercise Vital Sign    Days of Exercise per Week: 3 days    Minutes of Exercise per Session: 10 min  Stress: No Stress Concern Present (10/10/2023)   [employer] of Occupational Health - Occupational Stress Questionnaire    Feeling of Stress: Not at all  Social Connections: Moderately Integrated (10/10/2023)   Social Connection and Isolation Panel    Frequency of  Communication with Friends and Family: Twice a week    Frequency of Social Gatherings with Friends and Family: Once a week    Attends Religious Services: More than 4 times per year    Active Member of [employer] or Organizations: Yes    Attends Banker Meetings: More than 4 times per year    Marital Status: Widowed    Her Allergies Are:  No Known Allergies:   Her Current Medications Are:  Outpatient Encounter Medications as of 11/24/2023  Medication Sig   aspirin  EC 81 MG tablet Take 1 tablet (81 mg total) by mouth daily.   Calcium -Magnesium-Vitamin D (CALCIUM  MAGNESIUM PO) Take 1 tablet by mouth daily.   carvedilol  (COREG ) 6.25 MG tablet Take 1 tablet (6.25 mg total) by mouth 2 (two) times daily with a meal.   Coenzyme Q10 (CO Q 10 PO) Take 1 capsule by mouth daily.   Continuous Blood Gluc Sensor (DEXCOM G6 SENSOR) MISC 1 each by Does not apply route See admin instructions. Use one sensor once every 10 days to monitor blood sugars.   Continuous Blood Gluc Transmit (DEXCOM G6 TRANSMITTER) MISC 1 each by Does not apply route See admin instructions. Use one transmitter once every 90 days to transmit blood sugars.   ezetimibe  (ZETIA ) 10 MG tablet Take 1 tablet by mouth daily.   gentamicin  cream (GARAMYCIN ) 0.1 % Apply 1  Application topically 2 (two) times daily.   Glucagon  (GVOKE HYPOPEN  1-PACK) 1 MG/0.2ML SOAJ Inject 1 mg into the skin as needed (low blood sugar with impaired consciousness).   glucose blood test strip Use Contour Next test strips as instructed to check blood sugar 4 times daily. 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   insulin  lispro (ADMELOG ) 100 UNIT/ML injection INJECT 40 UNITS INTO THE SKIN DAILY. VIA PUMP   isosorbide  mononitrate (IMDUR ) 30 MG 24 hr tablet Take 1 tablet (30 mg total) by mouth 2 (two) times daily.   lisinopril  (ZESTRIL ) 10 MG tablet Take 1 tablet by mouth daily.   Multiple Vitamin (MULTIVITAMIN) tablet Take 1 tablet by mouth daily.   rosuvastatin  (CRESTOR ) 40 MG tablet Take 1 tablet by mouth daily.   No facility-administered encounter medications on file as of 11/24/2023.  :   Review of Systems:  Out of a complete 14 point review of systems, all are reviewed and negative with the exception of these symptoms as listed below:  Review of Systems  Neurological:        Memory loss for months, progressively worsening.     Objective:  Neurological Exam  Physical Exam Physical Examination:   Vitals:   11/24/23 1406  BP: (!) 148/76  Pulse: 76  SpO2: 98%    General Examination: The patient is a very pleasant 83 y.o. female in no acute distress. She appears mildly anxious, speech is scant, she is well-groomed, does not provide much in the way of her own history.   HEENT: Normocephalic, atraumatic, pupils are equal, round and reactive to light, extraocular tracking is good without limitation to gaze excursion or nystagmus noted. Hearing is grossly intact. Face is symmetric with normal facial animation. Speech is clear with no dysarthria noted. There is no hypophonia. There is no lip, neck/head, jaw or voice tremor. Neck is supple with  full range of passive and active motion. There are no carotid  bruits on auscultation. Oropharynx exam reveals: mild mouth dryness, adequate dental hygiene, small airway entry noted.  Tongue protrudes centrally and palate elevates symmetrically, tonsils not fully visualized, Mallampati class III.   Chest: Clear to auscultation without wheezing, rhonchi or crackles noted.  Heart: S1+S2+0, regular and normal without murmurs, rubs or gallops noted.   Abdomen: Soft, non-tender and non-distended.  Extremities: There is trace edema in the distal lower extremities bilaterally, her distal lower extremities are overall thin and mildly erythematous.    Skin: Warm and dry without trophic changes noted.   Musculoskeletal: exam reveals no obvious joint deformities.   Neurologically:  Mental status: The patient is awake, alert and pays attention but unable to provide details of her own history.  History is primarily supplemented/provided by her daughter-in-law.  Patient appears mildly anxious.       11/24/2023    2:22 PM 10/31/2023   11:37 AM 07/05/2023    2:18 PM  MMSE - Mini Mental State Exam  Orientation to time 0 2 1  Orientation to Place 1 3 3   Registration 3 2 3   Attention/ Calculation 0 2 5  Recall 0 0 2  Language- name 2 objects 2 2 2   Language- repeat 1 1 1   Language- follow 3 step command 2 3 3   Language- read & follow direction 1 1 1   Write a sentence 1 1 1   Copy design 0 0 0  Total score 11 17 22    On 11/24/2023: CDT: 1/4, AFT: 1/min.  Cranial nerves II - XII are as described above under HEENT exam.  Motor exam: Normal bulk, normal strength for age, normal tone, no obvious action or resting tremor.  Fine motor skills and coordination: grossly intact.  Cerebellar testing: No dysmetria or intention tremor. There is no truncal or gait ataxia.  Sensory exam: intact to light touch in the upper and lower extremities.  Gait, station and balance: She stands slowly, without major  difficulty, but does walk with a 6 point upright walking stick.    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  She reports a family history of memory loss in her mom who also had a stroke.  Patient has multiple vascular risk factors including a remote history of stroke which was found incidentally on a brain MRI in 2020.  We talked about the importance of supportive care, good nutrition, good hydration, fall prevention, and the nature of different types of memory loss.  History and examination and chart review as well as family history suggests vascular dementia.  We talked about utilizing symptomatic medication but mutually agreed to proceed with additional testing at this moment.  The patient is currently not ready to proceed with a home sleep test or brain MRI but is agreeable to doing some blood work today which I ordered.    Below is a summary of our discussion points of my recommendations based on chart review, history, and examination today.  This was an extended visit  of over 60 minutes with copious record review involved in considerable counseling and coordination of care.  They were given these instructions verbally during the visit and also in writing in her after visit summary which her daughter-in-law can access electronically:  <<  Blood work (which we will do today).  We will consider a  repeat brain scan, called MRI and call you with the test results. We will have to schedule you for this on a separate date. This test requires authorization from your insurance, and we will take care of the insurance process. We will consider a home sleep test to help rule out obstructive sleep apnea.  If you have obstructive sleep apnea I will likely recommend treatment with a CPAP or AutoPap machine. We may consider a medication trial for memory loss but for now we will keep you posted as to your test results.  Let me know when you are ready to proceed with home sleep testing and a brain MRI. Please try to stay well-hydrated and well rested, use your walking stick at all times. >> We will pick up our discussion about the next steps and treatment options after testing.  We will keep them posted as to the test results by phone call and/or MyChart messaging where possible.  We will plan to follow-up in our clinic accordingly as well.  I answered all their questions today and the patient and her daughter-in-law were in agreement.   I encouraged them to call with any interim questions, concerns, problems or updates or email us  through MyChart.    Thank you very much for allowing me to participate in the care of this nice patient. If I can be of any further assistance to you please do not hesitate to call me at [phone].  Sincerely,   True Mar, MD, PhD

## 2023-11-24 NOTE — Patient Instructions (Addendum)
 It was nice to meet you today.  You have complaints of memory loss: memory loss or changes in cognitive function can have many reasons and does not always mean you have dementia.  There are several conditions and situations that can contribute to subjective or objective memory loss.  These factors include: depression, stress, sleep deprivation or poor sleep from insomnia or sleep apnea, dehydration, fluctuation in blood sugar values, thyroid  or electrolyte dysfunction, medication effects from sedating medications or narcotic pain medication for example and certain vitamin deficiencies such as vitamin B12 deficiency, and anemia. Dementia can be caused by stroke, brain atherosclerosis or brain vascular disease due to vascular risk factors (smoking, high blood pressure, high cholesterol, obesity and uncontrolled diabetes), certain degenerative brain disorders (including Parkinson's disease and Multiple sclerosis) and by Alzheimer's disease or other, more rare and sometimes hereditary causes.   Here is what I would recommend:   Blood work (which we will do today).  We will consider a repeat brain scan, called MRI and call you with the test results. We will have to schedule you for this on a separate date. This test requires authorization from your insurance, and we will take care of the insurance process. We will consider a home sleep test to help rule out obstructive sleep apnea.  If you have obstructive sleep apnea I will likely recommend treatment with a CPAP or AutoPap machine. We may consider a medication trial for memory loss but for now we will keep you posted as to your test results.  Let me know when you are ready to proceed with home sleep testing and a brain MRI. Please try to stay well-hydrated and well rested, use your walking stick at all times.

## 2023-11-25 ENCOUNTER — Other Ambulatory Visit: Payer: Self-pay | Admitting: Cardiovascular Disease

## 2023-11-25 ENCOUNTER — Other Ambulatory Visit: Payer: Self-pay

## 2023-11-25 MED ORDER — ISOSORBIDE MONONITRATE ER 30 MG PO TB24: 30.0000 mg | ORAL_TABLET | Freq: Two times a day (BID) | ORAL | 0 refills | Status: DC

## 2023-11-29 ENCOUNTER — Ambulatory Visit: Payer: Self-pay | Admitting: Neurology

## 2023-11-29 LAB — VITAMIN B6: Vitamin B6: 8.5 ug/L (ref 3.4–65.2)

## 2023-11-29 LAB — AMMONIA: Ammonia: 35 ug/dL (ref 28–135)

## 2023-11-29 LAB — COMPREHENSIVE METABOLIC PANEL WITH GFR
ALT: 18 IU/L (ref 0–32)
AST: 27 IU/L (ref 0–40)
Albumin: 4.5 g/dL (ref 3.7–4.7)
Alkaline Phosphatase: 116 IU/L (ref 44–121)
BUN/Creatinine Ratio: 24 (ref 12–28)
BUN: 23 mg/dL (ref 8–27)
Bilirubin Total: 0.4 mg/dL (ref 0.0–1.2)
CO2: 20 mmol/L (ref 20–29)
Calcium: 9.6 mg/dL (ref 8.7–10.3)
Chloride: 100 mmol/L (ref 96–106)
Creatinine, Ser: 0.95 mg/dL (ref 0.57–1.00)
Globulin, Total: 2 g/dL (ref 1.5–4.5)
Glucose: 238 mg/dL — ABNORMAL HIGH (ref 70–99)
Potassium: 4.9 mmol/L (ref 3.5–5.2)
Sodium: 138 mmol/L (ref 134–144)
Total Protein: 6.5 g/dL (ref 6.0–8.5)
eGFR: 60 mL/min/1.73 (ref >59)

## 2023-11-29 LAB — B12 AND FOLATE PANEL
Folate: 11.6 ng/mL (ref >3.0)
Vitamin B-12: 688 pg/mL (ref 232–1245)

## 2023-11-29 LAB — VITAMIN B1: Thiamine: 136.4 nmol/L (ref 66.5–200.0)

## 2023-12-01 NOTE — Telephone Encounter (Signed)
-----   Message from True Mar sent at 11/29/2023  4:56 PM EDT ----- Labs were benign with the exception of elevated blood sugar. I recommend close FU with PCP for diabetes control. Pls call pt or family member on [place]. sa ----- Message ----- From: Rebecka Memos Lab Results In Sent: 11/25/2023   4:36 PM EDT To: True Mar, MD

## 2023-12-01 NOTE — Telephone Encounter (Signed)
 Spoke to daughter in Social worker (dpr checked ) Gave lab work results . Daughter in law states wants to move forward with MRI and decline sleep study for patient . Made daughter in law aware Dr Buck is out today but will forward message to her so when she returns next week she can place order for MRI Daughter in law expressed understanding and thanked  me for calling

## 2023-12-02 ENCOUNTER — Ambulatory Visit: Payer: Medicare Other | Admitting: Podiatry

## 2023-12-02 NOTE — Addendum Note (Signed)
 Addended by: Emori Mumme on: 12/02/2023 08:53 AM   Modules accepted: Orders

## 2023-12-05 NOTE — Telephone Encounter (Signed)
-----   Message from True Mar sent at 12/02/2023  8:54 AM EDT ----- MRI ordered. ----- Message ----- From: Shona Rojean LABOR, CMA Sent: 12/01/2023  10:27 AM EDT To: True Mar, MD  ----- Message from Rojean LABOR Shona, CMA sent at 12/01/2023 10:27 AM EDT -----  Per daughter in law wants to move forward with MRI and decline sleep study for patient  ----- Message ----- From: Mar True, MD Sent: 11/29/2023   4:56 PM EDT To: Gna-Pod 4 Results  Labs were benign with the exception of elevated blood sugar. I recommend close FU with PCP for diabetes control. Pls call pt or family member on [place]. sa ----- Message ----- From: Rebecka Memos Lab Results In Sent: 11/25/2023   4:36 PM EDT To: True Mar, MD

## 2023-12-05 NOTE — Telephone Encounter (Signed)
 MRI order sent to [facility] to schedule. [phone]

## 2023-12-05 NOTE — Telephone Encounter (Signed)
 Noted

## 2023-12-05 NOTE — Telephone Encounter (Signed)
 I spoke to daughter of pt and she had gotten the results of the labs.  I gave her information that Dr. Buck did order the MRI and I gave her the 3 to call and schedule.  She appreciated call back.

## 2023-12-08 ENCOUNTER — Telehealth: Payer: Self-pay | Admitting: Nurse Practitioner

## 2023-12-08 NOTE — Telephone Encounter (Signed)
 Patient dropped off document Home Health Certificate (Order ID [id number]), to be filled out by provider. Patient requested to send it back via Fax within 7-days. Document is located in providers tray at front office.Please advise at [phone]

## 2023-12-09 NOTE — Telephone Encounter (Signed)
CLINICAL USE BELOW THIS LINE (use X to signify taken)  __X__Form received and placed in providers office for signature. ____Form completed and faxed to [facility] Dept. ____Form completed & LVM to notify pt ready for pick up ____Charge sheet & copy of form in front office folder for office supervisor.   

## 2023-12-09 NOTE — Telephone Encounter (Signed)
 CLINICAL USE BELOW THIS LINE (use X to signify taken)  ____Form received and placed in providers office for signature. _X___Form completed and faxed to LOA Dept. _X___Form completed & LVM to notify pt ready for pick up __X__Charge sheet & copy of form in front office folder for office supervisor.

## 2023-12-10 ENCOUNTER — Other Ambulatory Visit: Payer: Self-pay | Admitting: Endocrinology

## 2023-12-10 DIAGNOSIS — E1065 Type 1 diabetes mellitus with hyperglycemia: Secondary | ICD-10-CM

## 2023-12-12 NOTE — Telephone Encounter (Signed)
 Refill request complete

## 2023-12-16 ENCOUNTER — Ambulatory Visit: Admitting: Family Medicine

## 2023-12-16 ENCOUNTER — Ambulatory Visit: Payer: Self-pay

## 2023-12-16 ENCOUNTER — Encounter: Payer: Self-pay | Admitting: Family Medicine

## 2023-12-16 ENCOUNTER — Other Ambulatory Visit

## 2023-12-16 VITALS — BP 132/73 | HR 78 | Temp 98.2°F | Ht 64.0 in | Wt 165.6 lb

## 2023-12-16 DIAGNOSIS — J209 Acute bronchitis, unspecified: Secondary | ICD-10-CM | POA: Diagnosis not present

## 2023-12-16 DIAGNOSIS — R0989 Other specified symptoms and signs involving the circulatory and respiratory systems: Secondary | ICD-10-CM

## 2023-12-16 DIAGNOSIS — R051 Acute cough: Secondary | ICD-10-CM

## 2023-12-16 LAB — POCT INFLUENZA A/B
Influenza A, POC: NEGATIVE
Influenza B, POC: NEGATIVE

## 2023-12-16 LAB — POC COVID19 BINAXNOW: SARS Coronavirus 2 Ag: NEGATIVE

## 2023-12-16 MED ORDER — DOXYCYCLINE HYCLATE 100 MG PO TABS: 100.0000 mg | ORAL_TABLET | Freq: Two times a day (BID) | ORAL | 0 refills | Status: DC

## 2023-12-16 NOTE — Telephone Encounter (Signed)
 FYI Only or Action Required?: FYI only for provider.  Patient was last seen in primary care on 10/31/2023 by Nche, Roselie Rockford, NP.  Called Nurse Triage reporting Cough.  Symptoms began several days ago.  Interventions attempted: OTC medications: Cold medicine and Rest, hydration, or home remedies.  Symptoms are: stable.  Triage Disposition: See Physician Within 24 Hours  Patient/caregiver understands and will follow disposition?: Yes     Copied from CRM #[id number]. Topic: Clinical - Red Word Triage >> Dec 16, 2023  9:50 AM Burnard DEL wrote: Red Word that prompted transfer to Nurse Triage: icreased sputum,green/brownish sputum Reason for Disposition  Coughing up rusty-colored (reddish-brown) sputum  Answer Assessment - Initial Assessment Questions Daughter in law Brandi calling on behalf of patient. Started feeling unwell on Saturday and progressed. Taken cold medicine, kind of helps but not much. Denies chest pain, fever. Has some SOB.   1. ONSET: When did the cough begin?      Saturday   2. SEVERITY: How bad is the cough today?      Gotten worse since Saturday  3. SPUTUM: Describe the color of your sputum (e.g., none, dry cough; clear, white, yellow, green)     Green/Brownish  4. HEMOPTYSIS: Are you coughing up any blood? If Yes, ask: How much? (e.g., flecks, streaks, tablespoons, etc.)     No  5. DIFFICULTY BREATHING: Are you having difficulty breathing? If Yes, ask: How bad is it? (e.g., mild, moderate, severe)      Some SOB  6. FEVER: Do you have a fever? If Yes, ask: What is your temperature, how was it measured, and when did it start?     Unknown  7. CARDIAC HISTORY: Do you have any history of heart disease? (e.g., heart attack, congestive heart failure)      No  8. LUNG HISTORY: Do you have any history of lung disease?  (e.g., pulmonary embolus, asthma, emphysema)     No  9. PE RISK FACTORS: Do you have a history of blood clots? (or:  recent major surgery, recent prolonged travel, bedridden)     No  10. OTHER SYMPTOMS: Do you have any other symptoms? (e.g., runny nose, wheezing, chest pain)       Constantly blowing nose, possibly mentioned throat hurting a little.  Protocols used: Cough - Acute Productive-A-AH

## 2023-12-16 NOTE — Patient Instructions (Signed)
 I sent a prescription for an antibiotic called doxycycline .  You may also take over-the-counter Mucinex DM, 1 tab every 12 hours as needed to try to suppress your cough and bring up mucus.

## 2023-12-16 NOTE — Progress Notes (Signed)
 OFFICE VISIT  12/16/2023  CC:  Chief Complaint  Patient presents with   Cough    Productive; phlegm (green, brownish) x 6 days, daughter in law gave her cold meds around 930a; chest congestion and fatigue as well; sleeping more than normal    Patient is a 83 y.o. female who presents accompanied by her daughter for cough.  HPI: Onset 1 week ago nasal congestion, postnasal drip, losing voice, significant cough.  Initially had some subjective fever.  Describes a little bit of difficulty getting full deep breaths when she is in the midst of a coughing fit. Otherwise, no shortness of breath, no chest pain, no wheezing. She is eating and drinking fairly well. No known recent sick contacts.  Review of systems: No nausea, vomiting, abdominal pain, rash, headache, or sore throat.  Past Medical History:  Diagnosis Date   AKI (acute kidney injury) (HCC) 04/16/2018   Arthritis    Carotid artery occlusion    Cataract    Had cataract surgery sometime before 2017   Coronary artery disease    CABG in 1996. Most recent cardiac catheterization in 2013 showed patent grafts including LIMA to LAD, SVG to OM, SVG to diagonal and SVG to PDA.   Diabetes mellitus without complication (HCC)    GERD (gastroesophageal reflux disease)    Hyperlipidemia    Hypertension    Neuromuscular disorder (HCC)    Osteoporosis    PAD (peripheral artery disease) (HCC)    Pain in finger of right hand 04/13/2016   Pneumonia due to COVID-19 virus 03/27/2019    Past Surgical History:  Procedure Laterality Date   BREAST SURGERY     Mastectomy before 1995   CARDIAC CATHETERIZATION     CORONARY ARTERY BYPASS GRAFT  1996   EYE SURGERY     FRACTURE SURGERY  03/2016   Broke wrist    Outpatient Medications Prior to Visit  Medication Sig Dispense Refill   ADMELOG  100 UNIT/ML injection INJECT 40 UNITS INTO THE SKIN DAILY. VIA PUMP 40 mL 1   aspirin  EC 81 MG tablet Take 1 tablet (81 mg total) by mouth daily. 90 tablet  3   Calcium -Magnesium-Vitamin D (CALCIUM  MAGNESIUM PO) Take 1 tablet by mouth daily.     carvedilol  (COREG ) 6.25 MG tablet Take 1 tablet (6.25 mg total) by mouth 2 (two) times daily with a meal. 180 tablet 3   Coenzyme Q10 (CO Q 10 PO) Take 1 capsule by mouth daily.     Continuous Blood Gluc Sensor (DEXCOM G6 SENSOR) MISC 1 each by Does not apply route See admin instructions. Use one sensor once every 10 days to monitor blood sugars. 3 each 0   Continuous Blood Gluc Transmit (DEXCOM G6 TRANSMITTER) MISC 1 each by Does not apply route See admin instructions. Use one transmitter once every 90 days to transmit blood sugars.     ezetimibe  (ZETIA ) 10 MG tablet Take 1 tablet by mouth daily. 90 tablet 3   Glucagon  (GVOKE HYPOPEN  1-PACK) 1 MG/0.2ML SOAJ Inject 1 mg into the skin as needed (low blood sugar with impaired consciousness). 0.4 mL 2   glucose blood test strip Use Contour Next test strips as instructed to check blood sugar 4 times daily. DX:E10.65 150 each 1   Insulin  Disposable Pump (OMNIPOD 5 DEXG7G6 PODS GEN 5) MISC APPLY EVERY 3 DAYS 30 each 3   Insulin  Disposable Pump (OMNIPOD 5 G6 INTRO, GEN 5,) KIT See admin instructions.     isosorbide  mononitrate (IMDUR )  30 MG 24 hr tablet Take 1 tablet by mouth twice daily. 60 tablet 10   lisinopril  (ZESTRIL ) 10 MG tablet Take 1 tablet by mouth daily. 90 tablet 0   Multiple Vitamin (MULTIVITAMIN) tablet Take 1 tablet by mouth daily.     rosuvastatin  (CRESTOR ) 40 MG tablet Take 1 tablet by mouth daily. 90 tablet 3   gentamicin  cream (GARAMYCIN ) 0.1 % Apply 1 Application topically 2 (two) times daily. (Patient not taking: Reported on 12/16/2023) 30 g 1   isosorbide  mononitrate (IMDUR ) 30 MG 24 hr tablet Take 1 tablet (30 mg total) by mouth 2 (two) times daily. 60 tablet 0   No facility-administered medications prior to visit.    No Known Allergies  Review of Systems  As per HPI  PE:    12/16/2023   11:18 AM 11/24/2023    2:06 PM 10/31/2023   10:17  AM  Vitals with BMI  Height 5' 4 5' 4 5' 4  Weight 165 lbs 10 oz 163 lbs 10 oz 162 lbs 13 oz  BMI 28.41 28.07 27.93  Systolic 132 148 879  Diastolic 73 76 66  Pulse 78 76 80     Physical Exam  VS: noted--normal. Gen: alert, NAD, NONTOXIC APPEARING. HEENT: eyes without injection, drainage, or swelling.  Ears: EACs clear, TMs with normal light reflex and landmarks.  Nose: Clear rhinorrhea, with some dried, crusty exudate adherent to mildly injected mucosa.  No purulent d/c.  No paranasal sinus TTP.  No facial swelling.  Throat and mouth without focal lesion.  No pharyngial swelling, erythema, or exudate.   Neck: supple, no LAD.   LUNGS: CTA bilat, nonlabored resps.   CV: RRR, no m/r/g. EXT: no c/c/e SKIN: no rash   LABS:  Last metabolic panel Lab Results  Component Value Date   GLUCOSE 238 (H) 11/24/2023   NA 138 11/24/2023   K 4.9 11/24/2023   CL 100 11/24/2023   CO2 20 11/24/2023   BUN 23 11/24/2023   CREATININE 0.95 11/24/2023   EGFR 60 11/24/2023   CALCIUM  9.6 11/24/2023   PROT 6.5 11/24/2023   ALBUMIN 4.5 11/24/2023   LABGLOB 2.0 11/24/2023   BILITOT 0.4 11/24/2023   ALKPHOS 116 11/24/2023   AST 27 11/24/2023   ALT 18 11/24/2023   ANIONGAP 9 10/24/2022   Last hemoglobin A1c Lab Results  Component Value Date   HGBA1C 8.0 (A) 10/13/2023   IMPRESSION AND PLAN:  Acute bronchitis, no improvement after 7 days. Rapid flu and rapid covid today were negative. Doxycycline  100 mg twice daily x 10 days prescribed today. Recommended over-the-counter Mucinex DM 1 tab every 12 hours as needed.  An After Visit Summary was printed and given to the patient.  FOLLOW UP: Return if symptoms worsen or fail to improve.  Signed:  Gerlene Hockey, MD           12/16/2023

## 2023-12-20 ENCOUNTER — Encounter (HOSPITAL_BASED_OUTPATIENT_CLINIC_OR_DEPARTMENT_OTHER): Payer: Self-pay

## 2023-12-20 ENCOUNTER — Emergency Department (HOSPITAL_BASED_OUTPATIENT_CLINIC_OR_DEPARTMENT_OTHER)

## 2023-12-20 ENCOUNTER — Ambulatory Visit: Payer: Self-pay | Admitting: *Deleted

## 2023-12-20 ENCOUNTER — Emergency Department (HOSPITAL_BASED_OUTPATIENT_CLINIC_OR_DEPARTMENT_OTHER): Admitting: Radiology

## 2023-12-20 ENCOUNTER — Other Ambulatory Visit: Payer: Self-pay

## 2023-12-20 ENCOUNTER — Inpatient Hospital Stay (HOSPITAL_BASED_OUTPATIENT_CLINIC_OR_DEPARTMENT_OTHER)
Admission: EM | Admit: 2023-12-20 | Discharge: 2023-12-22 | DRG: 189 | Disposition: A | Discharge status: Home / Self Care | Source: Ambulatory Visit | Attending: Family Medicine | Admitting: Family Medicine

## 2023-12-20 DIAGNOSIS — Z792 Long term (current) use of antibiotics: Secondary | ICD-10-CM

## 2023-12-20 DIAGNOSIS — E785 Hyperlipidemia, unspecified: Secondary | ICD-10-CM | POA: Diagnosis present

## 2023-12-20 DIAGNOSIS — J90 Pleural effusion, not elsewhere classified: Secondary | ICD-10-CM | POA: Diagnosis not present

## 2023-12-20 DIAGNOSIS — J690 Pneumonitis due to inhalation of food and vomit: Principal | ICD-10-CM | POA: Diagnosis present

## 2023-12-20 DIAGNOSIS — T17908A Unspecified foreign body in respiratory tract, part unspecified causing other injury, initial encounter: Secondary | ICD-10-CM

## 2023-12-20 DIAGNOSIS — Z833 Family history of diabetes mellitus: Secondary | ICD-10-CM

## 2023-12-20 DIAGNOSIS — E1051 Type 1 diabetes mellitus with diabetic peripheral angiopathy without gangrene: Secondary | ICD-10-CM | POA: Diagnosis present

## 2023-12-20 DIAGNOSIS — E109 Type 1 diabetes mellitus without complications: Secondary | ICD-10-CM | POA: Diagnosis present

## 2023-12-20 DIAGNOSIS — Z8249 Family history of ischemic heart disease and other diseases of the circulatory system: Secondary | ICD-10-CM

## 2023-12-20 DIAGNOSIS — J189 Pneumonia, unspecified organism: Principal | ICD-10-CM | POA: Diagnosis present

## 2023-12-20 DIAGNOSIS — I700 Atherosclerosis of aorta: Secondary | ICD-10-CM | POA: Diagnosis not present

## 2023-12-20 DIAGNOSIS — I6521 Occlusion and stenosis of right carotid artery: Secondary | ICD-10-CM | POA: Diagnosis present

## 2023-12-20 DIAGNOSIS — I739 Peripheral vascular disease, unspecified: Secondary | ICD-10-CM | POA: Diagnosis present

## 2023-12-20 DIAGNOSIS — J9601 Acute respiratory failure with hypoxia: Principal | ICD-10-CM | POA: Diagnosis present

## 2023-12-20 DIAGNOSIS — Z7982 Long term (current) use of aspirin: Secondary | ICD-10-CM

## 2023-12-20 DIAGNOSIS — I517 Cardiomegaly: Secondary | ICD-10-CM | POA: Diagnosis not present

## 2023-12-20 DIAGNOSIS — Z79899 Other long term (current) drug therapy: Secondary | ICD-10-CM | POA: Diagnosis not present

## 2023-12-20 DIAGNOSIS — Z1152 Encounter for screening for COVID-19: Secondary | ICD-10-CM | POA: Diagnosis not present

## 2023-12-20 DIAGNOSIS — R2681 Unsteadiness on feet: Secondary | ICD-10-CM | POA: Diagnosis present

## 2023-12-20 DIAGNOSIS — I10 Essential (primary) hypertension: Secondary | ICD-10-CM | POA: Diagnosis present

## 2023-12-20 DIAGNOSIS — I2510 Atherosclerotic heart disease of native coronary artery without angina pectoris: Secondary | ICD-10-CM | POA: Diagnosis present

## 2023-12-20 DIAGNOSIS — Z951 Presence of aortocoronary bypass graft: Secondary | ICD-10-CM | POA: Diagnosis not present

## 2023-12-20 DIAGNOSIS — Z825 Family history of asthma and other chronic lower respiratory diseases: Secondary | ICD-10-CM | POA: Diagnosis not present

## 2023-12-20 DIAGNOSIS — Z8616 Personal history of COVID-19: Secondary | ICD-10-CM | POA: Diagnosis not present

## 2023-12-20 DIAGNOSIS — M810 Age-related osteoporosis without current pathological fracture: Secondary | ICD-10-CM | POA: Diagnosis present

## 2023-12-20 DIAGNOSIS — R0602 Shortness of breath: Secondary | ICD-10-CM | POA: Diagnosis not present

## 2023-12-20 DIAGNOSIS — I2699 Other pulmonary embolism without acute cor pulmonale: Secondary | ICD-10-CM | POA: Diagnosis not present

## 2023-12-20 DIAGNOSIS — E1042 Type 1 diabetes mellitus with diabetic polyneuropathy: Secondary | ICD-10-CM | POA: Diagnosis present

## 2023-12-20 DIAGNOSIS — R918 Other nonspecific abnormal finding of lung field: Secondary | ICD-10-CM | POA: Diagnosis not present

## 2023-12-20 DIAGNOSIS — Z794 Long term (current) use of insulin: Secondary | ICD-10-CM | POA: Diagnosis not present

## 2023-12-20 LAB — BASIC METABOLIC PANEL WITH GFR
Anion gap: 11 (ref 5–15)
BUN: 14 mg/dL (ref 8–23)
CO2: 29 mmol/L (ref 22–32)
Calcium: 9.8 mg/dL (ref 8.9–10.3)
Chloride: 96 mmol/L — ABNORMAL LOW (ref 98–111)
Creatinine, Ser: 0.76 mg/dL (ref 0.44–1.00)
GFR, Estimated: >60 mL/min (ref >60)
Glucose, Bld: 147 mg/dL — ABNORMAL HIGH (ref 70–99)
Potassium: 4.4 mmol/L (ref 3.5–5.1)
Sodium: 135 mmol/L (ref 135–145)

## 2023-12-20 LAB — RESP PANEL BY RT-PCR (RSV, FLU A&B, COVID)  RVPGX2
Influenza A by PCR: NEGATIVE
Influenza B by PCR: NEGATIVE
Resp Syncytial Virus by PCR: NEGATIVE
SARS Coronavirus 2 by RT PCR: NEGATIVE

## 2023-12-20 LAB — CBC
HCT: 35.2 % — ABNORMAL LOW (ref 36.0–46.0)
Hemoglobin: 11.5 g/dL — ABNORMAL LOW (ref 12.0–15.0)
MCH: 33 pg (ref 26.0–34.0)
MCHC: 32.7 g/dL (ref 30.0–36.0)
MCV: 100.9 fL — ABNORMAL HIGH (ref 80.0–100.0)
Platelets: 254 K/uL (ref 150–400)
RBC: 3.49 MIL/uL — ABNORMAL LOW (ref 3.87–5.11)
RDW: 13.6 % (ref 11.5–15.5)
WBC: 5.4 K/uL (ref 4.0–10.5)
nRBC: 0 % (ref 0.0–0.2)

## 2023-12-20 LAB — PROTIME-INR
INR: 0.9 (ref 0.8–1.2)
Prothrombin Time: 13.1 s (ref 11.4–15.2)

## 2023-12-20 MED ORDER — AZITHROMYCIN 250 MG PO TABS
500.0000 mg | ORAL_TABLET | Freq: Once | ORAL | Status: AC
  Administered 2023-12-21: 500 mg via ORAL
  Filled 2023-12-20: qty 2

## 2023-12-20 MED ORDER — SODIUM CHLORIDE 0.9 % IV SOLN
1.0000 g | Freq: Once | INTRAVENOUS | Status: AC
  Administered 2023-12-21: 1 g via INTRAVENOUS
  Filled 2023-12-20: qty 10

## 2023-12-20 MED ORDER — IOHEXOL 350 MG/ML SOLN
75.0000 mL | Freq: Once | INTRAVENOUS | Status: AC | PRN
  Administered 2023-12-20: 75 mL via INTRAVENOUS

## 2023-12-20 NOTE — ED Notes (Signed)
 DIL said that patients BS at 89 and dropping. Pt given orange juice and graham crackers. RN notified.

## 2023-12-20 NOTE — Telephone Encounter (Signed)
 FYI Only or Action Required?: Action required by provider: request for appointment, update on patient condition, and antibiotics not improving sx.  Patient was last seen in primary care on 12/16/2023 by McGowen, Aleene DEL, MD.  Called Nurse Triage reporting Shortness of Breath.  Symptoms began today.  Interventions attempted: Prescription medications: mucinex and doxycyline.  Symptoms are: gradually worsening.  Triage Disposition: See HCP Within 4 Hours (Or PCP Triage)  Patient/caregiver understands and will follow disposition?: Unsure              Copied from CRM #[id number]. Topic: Clinical - Red Word Triage >> Dec 20, 2023  1:42 PM Deaijah H wrote: Red Word that prompted transfer to Nurse Triage: Not feeling better ; no improvement. Can't breathe , very little mucus now (green/brownish) Reason for Disposition  [1] MILD difficulty breathing (e.g., minimal/no SOB at rest, SOB with walking, pulse < 100) AND [2] NEW-onset or WORSE than normal  Answer Assessment - Initial Assessment Questions Recommended ED due to sx. Patient daughter in law on [place] requesting if patient can be seen in office today . Due to SOB at rest and no available appt recommended ED. Caller reports patient is no better than last OV 12/16/23 and difficulty coughing up phlegm. Please advise requesting call back.   CAL notified unsure of disposition.      1. RESPIRATORY STATUS: Describe your breathing? (e.g., wheezing, shortness of breath, unable to speak, severe coughing)      SOB at rest and with coughing and wheezing per patient daughter in law on D[facility] 2. ONSET: When did this breathing problem begin?      Over the weekend 3. PATTERN Does the difficult breathing come and go, or has it been constant since it started?      SOB at rest 4. SEVERITY: How bad is your breathing? (e.g., mild, moderate, severe)      SOB at rest but can breathe 5. RECURRENT SYMPTOM: Have you had difficulty breathing  before? If Yes, ask: When was the last time? and What happened that time?      Yes see last OV Friday  6. CARDIAC HISTORY: Do you have any history of heart disease? (e.g., heart attack, angina, bypass surgery, angioplasty)      See hx  7. LUNG HISTORY: Do you have any history of lung disease?  (e.g., pulmonary embolus, asthma, emphysema)     Hx  8. CAUSE: What do you think is causing the breathing problem?       9. OTHER SYMPTOMS: Do you have any other symptoms? (e.g., chest pain, cough, dizziness, fever, runny nose)     SOB at rest and with coughing, productive cough brown and green in color. Choking on phlegm too thick to cough up. Still taking mucinex and doxycyline. 10. O2 SATURATION MONITOR:  Do you use an oxygen saturation monitor (pulse oximeter) at home? If Yes, ask: What is your reading (oxygen level) today? What is your usual oxygen saturation reading? (e.g., 95%)       na 11. PREGNANCY: Is there any chance you are pregnant? When was your last menstrual period?       na 12. TRAVEL: Have you traveled out of the country in the last month? (e.g., travel history, exposures)       na  Protocols used: Breathing Difficulty-A-AH

## 2023-12-20 NOTE — ED Provider Notes (Signed)
 [place] [facility]ERGENCY DEPARTMENT AT [facility] Provider Note   CSN: [id number] Arrival date & time: 12/20/23  1945     Patient presents with: Shortness of Breath and Cough   Natasha Hernandez is a 83 y.o. female.   Patient is an 83 year old female with a history of CAD status post CABG in 1996, diabetes, hypertension, hyperlipidemia, PAD who is presenting today with complaints of persistent cough and shortness of breath.  Patient reports about a week and 1/2 to 2 weeks ago she developed a cough, nasal congestion and reports she is just not gotten any better.  The cough is continued with mucus and she saw her doctor on Friday and at that time was started on doxycycline .  However she is not getting any better and reports feeling more short of breath with activity.  She has not had fever that she is aware of and has not had wheezing.  She reports she is not getting any sleep because of all the coughing and she is feeling rundown.  She has not had significant swelling in her legs and denies history of CHF.  No prior history of PE.  No unilateral leg pain or swelling.  She does not use inhalers regularly at home.  The history is provided by the patient and a relative.  Shortness of Breath Associated symptoms: cough   Cough Associated symptoms: shortness of breath        Prior to Admission medications   Medication Sig Start Date End Date Taking? Authorizing Provider  ADMELOG  100 UNIT/ML injection INJECT 40 UNITS INTO THE SKIN DAILY. VIA PUMP 12/12/23   Thapa, [place], MD  aspirin  EC 81 MG tablet Take 1 tablet (81 mg total) by mouth daily. 03/25/17   Darron Deatrice LABOR, MD  Calcium -Magnesium-Vitamin D (CALCIUM  MAGNESIUM PO) Take 1 tablet by mouth daily.    [provider]  carvedilol  (COREG ) 6.25 MG tablet Take 1 tablet (6.25 mg total) by mouth 2 (two) times daily with a meal. 02/28/23   Darliss Rogue, MD  Coenzyme Q10 (CO Q 10 PO) Take 1 capsule by mouth daily.    [provider]  Continuous Blood Gluc Sensor (DEXCOM G6 SENSOR) MISC 1 each by Does not apply route See admin instructions. Use one sensor once every 10 days to monitor blood sugars. 01/12/22   Von [place], MD  Continuous Blood Gluc Transmit (DEXCOM G6 TRANSMITTER) MISC 1 each by Does not apply route See admin instructions. Use one transmitter once every 90 days to transmit blood sugars.    [provider]  doxycycline  (VIBRA -TABS) 100 MG tablet Take 1 tablet (100 mg total) by mouth 2 (two) times daily for 10 days. 12/16/23 12/26/23  McGowen, Aleene DEL, MD  ezetimibe  (ZETIA ) 10 MG tablet Take 1 tablet by mouth daily. 05/30/23   Thapa, [place], MD  gentamicin  cream (GARAMYCIN ) 0.1 % Apply 1 Application topically 2 (two) times daily. Patient not taking: Reported on 12/16/2023 01/25/23   Janit Thresa HERO, DPM  Glucagon  (GVOKE HYPOPEN  1-PACK) 1 MG/0.2ML SOAJ Inject 1 mg into the skin as needed (low blood sugar with impaired consciousness). 01/07/23   Thapa, [place], MD  glucose blood test strip Use Contour Next test strips as instructed to check blood sugar 4 times daily. DX:E10.65 05/30/19   Von Pacific, MD  Insulin  Disposable Pump (OMNIPOD 5 DEXG7G6 PODS GEN 5) MISC APPLY EVERY 3 DAYS 06/09/23   Thapa, [place], MD  Insulin  Disposable Pump (OMNIPOD 5 G6 INTRO, GEN 5,) KIT  See admin instructions. 02/26/21   [provider]  isosorbide  mononitrate (IMDUR ) 30 MG 24 hr tablet Take 1 tablet by mouth twice daily. 11/25/23   Darron Deatrice LABOR, MD  lisinopril  (ZESTRIL ) 10 MG tablet Take 1 tablet by mouth daily. 10/26/23   Thapa, [place], MD  Multiple Vitamin (MULTIVITAMIN) tablet Take 1 tablet by mouth daily.    [provider]  rosuvastatin  (CRESTOR ) 40 MG tablet Take 1 tablet by mouth daily. 05/30/23   Thapa, [place], MD    Allergies: Patient has no known allergies.    Review of Systems  Respiratory:  Positive for cough and shortness of breath.     Updated Vital Signs BP (!) 162/64   Pulse 75   Temp  98.6 F (37 C)   Resp 18   Ht 5' 4 (1.626 m)   Wt 74.8 kg   SpO2 97%   BMI 28.32 kg/m   Physical Exam Vitals and nursing note reviewed.  Constitutional:      General: She is not in acute distress.    Appearance: She is well-developed.  HENT:     Head: Normocephalic and atraumatic.  Eyes:     Pupils: Pupils are equal, round, and reactive to light.  Cardiovascular:     Rate and Rhythm: Normal rate and regular rhythm.     Heart sounds: Normal heart sounds. No murmur heard.    No friction rub.  Pulmonary:     Effort: Pulmonary effort is normal. No tachypnea.     Breath sounds: Examination of the right-middle field reveals decreased breath sounds. Examination of the left-middle field reveals decreased breath sounds. Examination of the right-lower field reveals decreased breath sounds. Examination of the left-lower field reveals decreased breath sounds. Decreased breath sounds present. No wheezing or rales.  Abdominal:     General: Bowel sounds are normal. There is no distension.     Palpations: Abdomen is soft.     Tenderness: There is no abdominal tenderness. There is no guarding or rebound.  Musculoskeletal:        General: No tenderness. Normal range of motion.     Right lower leg: No edema.     Left lower leg: No edema.     Comments: No edema  Skin:    General: Skin is warm and dry.     Findings: No rash.  Neurological:     Mental Status: She is alert and oriented to person, place, and time. Mental status is at baseline.     Cranial Nerves: No cranial nerve deficit.  Psychiatric:        Behavior: Behavior normal.     (all labs ordered are listed, but only abnormal results are displayed) Labs Reviewed  BASIC METABOLIC PANEL WITH GFR - Abnormal; Notable for the following components:      Result Value   Chloride 96 (*)    Glucose, Bld 147 (*)    All other components within normal limits  CBC - Abnormal; Notable for the following components:   RBC 3.49 (*)     Hemoglobin 11.5 (*)    HCT 35.2 (*)    MCV 100.9 (*)    All other components within normal limits  RESP PANEL BY RT-PCR (RSV, FLU A&B, COVID)  [id number]  PROTIME-INR    EKG: EKG Interpretation Date/Time:  Tuesday December 20 2023 19:49:23 EDT Ventricular Rate:  84 PR Interval:  180 QRS Duration:  72 QT Interval:  400 QTC Calculation: 472 R Axis:  21  Text Interpretation: Sinus rhythm with occasional and consecutive Premature ventricular complexes Low voltage QRS Abnormal QRS-T angle, consider primary T wave abnormality Artifact When compared with ECG of 17-Dec-2022 09:26, Premature ventricular complexes are now Present Abberant conduction is no longer Present Criteria for Inferior infarct are no longer Present Confirmed by Doretha Folks ([id number]) on 12/20/2023 8:27:33 PM  Radiology: CT Angio Chest PE W and/or Wo Contrast Result Date: 12/20/2023 CLINICAL DATA:  Shortness of breath and coughing, no improvement on antibiotics. Pulmonary embolism suspected versus pneumonia. EXAM: CT ANGIOGRAPHY CHEST WITH CONTRAST TECHNIQUE: Multidetector CT imaging of the chest was performed using the standard protocol during bolus administration of intravenous contrast. Multiplanar CT image reconstructions and MIPs were obtained to evaluate the vascular anatomy. RADIATION DOSE REDUCTION: This exam was performed according to the departmental dose-optimization program which includes automated exposure control, adjustment of the mA and/or kV according to patient size and/or use of iterative reconstruction technique. CONTRAST:  75mL OMNIPAQUE  IOHEXOL  350 MG/ML SOLN COMPARISON:  PA and lateral chest from today, chest radiograph 04/06/2020, CTA chest 04/05/2023, and a more recent CTA abdomen and pelvis of 10/24/2022. FINDINGS: Cardiovascular: There is diagnostic pulmonary arterial opacification. No arterial embolus is seen. Pulmonary trunk is upper normal in caliber but unchanged. There are calcific plaques in the aorta  and heavily in the coronary arteries. No aortic or great vessel aneurysm, stenosis or dissection. There are CABG changes and a healed sternotomy, chronic. No significant pericardial fluid. No pulmonary venous dilatation. Mediastinum/Nodes: There are increasingly prominent precarinal lymph nodes up to 9 mm in short axis, mildly prominent right hilar lymph nodes up to 1.1 cm, a subcarinal node measuring 1.1 cm also increased. Small hiatal hernia. The esophagus is patulous, distally with increased thickening. Consider endoscopic follow-up. Negative appearance thyroid  gland, thoracic trachea and the axillary spaces. Lungs/Pleura: Small left and minimal layering right pleural effusions. No overt pulmonary edema. There are biapical pleuroparenchymal scarring changes. Calcified granulomas posteriorly in the right lower lobe. There is layering fluid in both main bronchi extending into both lower lobes, with segmental and subsegmental bronchial impactions in both lower lobes in the basal segments. Diffuse bronchial thickening. There is perihilar ground-glass opacity in the right upper lobe base, mid chest level. Peripheral ground-glass opacities noted anteriorly in the left upper lobe. Roughly rectangular left upper lobe nodule laterally is seen measuring 1.0 x 0.6 cm, new. This is probably an inflammatory nodule but will require follow-up. Posterior atelectatic changes are present in both lower lobes without focal consolidation. The lungs are otherwise clear.  No pneumothorax. Upper Abdomen: Numerous calcified splenic granulomas. There are occasional hepatic calcified granulomas. No acute upper abdominal findings. Abdominal aortic visceral branch vessel atherosclerosis. Musculoskeletal: Osteopenia with advanced degenerative change of the thoracic spine, mild thoracic kyphodextroscoliosis and multilevel interbody thoracic spine ankylosis. No acute or other significant osseous findings. Prior left mastectomy. No chest wall  mass is seen. Review of the MIP images confirms the above findings. IMPRESSION: 1. No evidence of arterial embolus. 2. Aortic and coronary artery atherosclerosis. 3. Small left and minimal right pleural effusions with no overt pulmonary edema. 4. Diffuse bronchial thickening with fluid in both main bronchi extending into both lower lobes, with segmental and subsegmental bronchial impactions in both lower lobes. Findings consistent with bronchitis with potential for aspiration component. 5. Perihilar ground-glass opacity in the right upper lobe base, and peripheral ground-glass opacities anteriorly in the left upper lobe. Findings likely due to multifocal pneumonitis. 6. Increased prominence of mediastinal and  right hilar lymph nodes, probably reactive. 7. Small hiatal hernia with increased distal esophageal thickening. Consider endoscopic follow-up. 8. Osteopenia and kyphoscoliosis with advanced degenerative change. Aortic Atherosclerosis (ICD10-I70.0). Electronically Signed   By: Francis Quam M.D.   On: 12/20/2023 23:10   DG Chest 2 View Result Date: 12/20/2023 CLINICAL DATA:  shortness of breath EXAM: CHEST - 2 VIEW COMPARISON:  April 06, 2020 FINDINGS: Chronic coarsening of the pulmonary interstitium without overt pulmonary edema. No focal airspace consolidation or pneumothorax. Trace left pleural effusion. Mild cardiomegaly. Sternotomy wires and CABG markers. Aortic atherosclerosis. no acute fracture or destructive lesions. Multilevel thoracic osteophytosis. IMPRESSION: Trace left pleural effusion. Electronically Signed   By: Rogelia Myers M.D.   On: 12/20/2023 20:10     Procedures   Medications Ordered in the ED  cefTRIAXone  (ROCEPHIN ) 1 g in sodium chloride  0.9 % 100 mL IVPB (has no administration in time range)  azithromycin  (ZITHROMAX ) tablet 500 mg (has no administration in time range)  iohexol  (OMNIPAQUE ) 350 MG/ML injection 75 mL (75 mLs Intravenous Contrast Given 12/20/23 2213)                                     Medical Decision Making Amount and/or Complexity of Data Reviewed External Data Reviewed: notes. Labs: ordered. Decision-making details documented in ED Course. Radiology: ordered and independent interpretation performed. Decision-making details documented in ED Course. ECG/medicine tests: ordered and independent interpretation performed. Decision-making details documented in ED Course.  Risk Prescription drug management. Decision regarding hospitalization.   Pt with multiple medical problems and comorbidities and presenting today with a complaint that caries a high risk for morbidity and mortality.  Here today with the above complaints.  Concern for possible pneumonia, PE, CHF.  Low suspicion for ACS.  Patient has been on doxycycline  for 5 days without improvement of symptoms.  Sats on room air are 91 to 92% but patient is taking short breaths because of coughing.  She does not have any wheezing on exam. I independently interpreted patient's labs and EKG.  EKG without acute changes, BMP within normal limits, CBC with normal white count and stable hemoglobin of 11.5, INR within normal limits, COVID and flu are negative.  I have independently visualized and interpreted pt's images today.  Chest x-ray without acute findings.  CT done for further evaluation.  CT without evidence of PE however radiology report minimal effusions are noted but diffuse bronchial thickening with fluid in both main bronchi extending into both lower lobes with segmental and subsegmental bronchial impactions in both lower lobes consistent with bronchiolitis with the potential for aspiration component as well as groundglass opacities in the right upper lobe and in the left upper lobe concerning for a multifocal pneumonitis.  12:02 AM Patient also became hypoxic here to 85% on room air and is now requiring 2 L with sats now at 97%.  Patient was given Rocephin  and azithromycin .  She does report she  had several episodes of choking when she was trying to eat so she may have a component of aspiration pneumonia as well.  Consulted hospitalist for admission.  Discussed this with the patient and her daughter and they are comfortable with this plan      Final diagnoses:  Multifocal pneumonia  Aspiration into respiratory tract, initial encounter    ED Discharge Orders     None          Ersa Delaney,  [place], MD 12/21/23 0002

## 2023-12-20 NOTE — ED Triage Notes (Signed)
 Daughter reports that two weeks ago she went to her PCP for shortness of breath and cough, was neg. For covid/flu. Sent home on antibiotics and told to use mucinex.  Family contacted PCP today and was instructed to come to the ER for what could likely be pneumonia.

## 2023-12-21 DIAGNOSIS — Z1152 Encounter for screening for COVID-19: Secondary | ICD-10-CM | POA: Diagnosis not present

## 2023-12-21 DIAGNOSIS — I10 Essential (primary) hypertension: Secondary | ICD-10-CM | POA: Diagnosis present

## 2023-12-21 DIAGNOSIS — I2510 Atherosclerotic heart disease of native coronary artery without angina pectoris: Secondary | ICD-10-CM | POA: Diagnosis present

## 2023-12-21 DIAGNOSIS — M810 Age-related osteoporosis without current pathological fracture: Secondary | ICD-10-CM | POA: Diagnosis present

## 2023-12-21 DIAGNOSIS — J9601 Acute respiratory failure with hypoxia: Secondary | ICD-10-CM | POA: Diagnosis present

## 2023-12-21 DIAGNOSIS — Z792 Long term (current) use of antibiotics: Secondary | ICD-10-CM | POA: Diagnosis not present

## 2023-12-21 DIAGNOSIS — J690 Pneumonitis due to inhalation of food and vomit: Secondary | ICD-10-CM | POA: Diagnosis present

## 2023-12-21 DIAGNOSIS — Z833 Family history of diabetes mellitus: Secondary | ICD-10-CM | POA: Diagnosis not present

## 2023-12-21 DIAGNOSIS — R2681 Unsteadiness on feet: Secondary | ICD-10-CM | POA: Diagnosis present

## 2023-12-21 DIAGNOSIS — Z825 Family history of asthma and other chronic lower respiratory diseases: Secondary | ICD-10-CM | POA: Diagnosis not present

## 2023-12-21 DIAGNOSIS — E1051 Type 1 diabetes mellitus with diabetic peripheral angiopathy without gangrene: Secondary | ICD-10-CM | POA: Diagnosis present

## 2023-12-21 DIAGNOSIS — J189 Pneumonia, unspecified organism: Secondary | ICD-10-CM | POA: Diagnosis present

## 2023-12-21 DIAGNOSIS — Z951 Presence of aortocoronary bypass graft: Secondary | ICD-10-CM | POA: Diagnosis not present

## 2023-12-21 DIAGNOSIS — Z8616 Personal history of COVID-19: Secondary | ICD-10-CM | POA: Diagnosis not present

## 2023-12-21 DIAGNOSIS — E1042 Type 1 diabetes mellitus with diabetic polyneuropathy: Secondary | ICD-10-CM | POA: Diagnosis present

## 2023-12-21 DIAGNOSIS — E785 Hyperlipidemia, unspecified: Secondary | ICD-10-CM | POA: Diagnosis present

## 2023-12-21 DIAGNOSIS — Z7982 Long term (current) use of aspirin: Secondary | ICD-10-CM | POA: Diagnosis not present

## 2023-12-21 DIAGNOSIS — Z79899 Other long term (current) drug therapy: Secondary | ICD-10-CM | POA: Diagnosis not present

## 2023-12-21 DIAGNOSIS — Z794 Long term (current) use of insulin: Secondary | ICD-10-CM | POA: Diagnosis not present

## 2023-12-21 DIAGNOSIS — Z8249 Family history of ischemic heart disease and other diseases of the circulatory system: Secondary | ICD-10-CM | POA: Diagnosis not present

## 2023-12-21 HISTORY — DX: Acute respiratory failure with hypoxia: J96.01

## 2023-12-21 LAB — GLUCOSE, CAPILLARY
Glucose-Capillary: 104 mg/dL — ABNORMAL HIGH (ref 70–99)
Glucose-Capillary: 137 mg/dL — ABNORMAL HIGH (ref 70–99)
Glucose-Capillary: 192 mg/dL — ABNORMAL HIGH (ref 70–99)

## 2023-12-21 LAB — MAGNESIUM: Magnesium: 2.3 mg/dL (ref 1.7–2.4)

## 2023-12-21 LAB — PROCALCITONIN: Procalcitonin: <0.1 ng/mL

## 2023-12-21 LAB — PHOSPHORUS: Phosphorus: 4.3 mg/dL (ref 2.5–4.6)

## 2023-12-21 MED ORDER — CARVEDILOL 3.125 MG PO TABS
6.2500 mg | ORAL_TABLET | Freq: Two times a day (BID) | ORAL | Status: DC
  Administered 2023-12-21 – 2023-12-22 (×2): 6.25 mg via ORAL
  Filled 2023-12-21 (×2): qty 2

## 2023-12-21 MED ORDER — LEVALBUTEROL HCL 0.63 MG/3ML IN NEBU
0.6300 mg | INHALATION_SOLUTION | Freq: Four times a day (QID) | RESPIRATORY_TRACT | Status: DC | PRN
  Administered 2023-12-21: 0.63 mg via RESPIRATORY_TRACT
  Filled 2023-12-21: qty 3

## 2023-12-21 MED ORDER — SODIUM CHLORIDE 0.9 % IV SOLN: INTRAVENOUS | Status: DC

## 2023-12-21 MED ORDER — IPRATROPIUM BROMIDE 0.02 % IN SOLN: 0.5000 mg | Freq: Four times a day (QID) | RESPIRATORY_TRACT | Status: DC | PRN

## 2023-12-21 MED ORDER — ONDANSETRON HCL 4 MG PO TABS: 4.0000 mg | ORAL_TABLET | Freq: Four times a day (QID) | ORAL | Status: DC | PRN

## 2023-12-21 MED ORDER — TRAZODONE HCL 50 MG PO TABS: 25.0000 mg | ORAL_TABLET | Freq: Every evening | ORAL | Status: DC | PRN

## 2023-12-21 MED ORDER — FLEET ENEMA RE ENEM: 1.0000 | ENEMA | Freq: Once | RECTAL | Status: DC | PRN

## 2023-12-21 MED ORDER — ISOSORBIDE MONONITRATE ER 30 MG PO TB24
30.0000 mg | ORAL_TABLET | Freq: Two times a day (BID) | ORAL | Status: DC
  Administered 2023-12-21 – 2023-12-22 (×3): 30 mg via ORAL
  Filled 2023-12-21 (×3): qty 1

## 2023-12-21 MED ORDER — SODIUM CHLORIDE 0.9% FLUSH
3.0000 mL | Freq: Two times a day (BID) | INTRAVENOUS | Status: DC
  Administered 2023-12-21 – 2023-12-22 (×3): 3 mL via INTRAVENOUS

## 2023-12-21 MED ORDER — LISINOPRIL 10 MG PO TABS
10.0000 mg | ORAL_TABLET | Freq: Every day | ORAL | Status: DC
  Administered 2023-12-21 – 2023-12-22 (×2): 10 mg via ORAL
  Filled 2023-12-21 (×2): qty 1

## 2023-12-21 MED ORDER — ACETAMINOPHEN 650 MG RE SUPP: 650.0000 mg | Freq: Four times a day (QID) | RECTAL | Status: DC | PRN

## 2023-12-21 MED ORDER — HYDROMORPHONE HCL 1 MG/ML IJ SOLN: 0.5000 mg | INTRAMUSCULAR | Status: DC | PRN

## 2023-12-21 MED ORDER — INSULIN PUMP
Freq: Three times a day (TID) | SUBCUTANEOUS | Status: DC
  Filled 2023-12-21: qty 1

## 2023-12-21 MED ORDER — BISACODYL 5 MG PO TBEC: 5.0000 mg | DELAYED_RELEASE_TABLET | Freq: Every day | ORAL | Status: DC | PRN

## 2023-12-21 MED ORDER — ACETAMINOPHEN 325 MG PO TABS: 650.0000 mg | ORAL_TABLET | Freq: Four times a day (QID) | ORAL | Status: DC | PRN

## 2023-12-21 MED ORDER — ROSUVASTATIN CALCIUM 20 MG PO TABS
40.0000 mg | ORAL_TABLET | Freq: Every day | ORAL | Status: DC
  Administered 2023-12-21 – 2023-12-22 (×2): 40 mg via ORAL
  Filled 2023-12-21 (×2): qty 2

## 2023-12-21 MED ORDER — ASPIRIN 81 MG PO TBEC
81.0000 mg | DELAYED_RELEASE_TABLET | Freq: Every day | ORAL | Status: DC
  Administered 2023-12-21 – 2023-12-22 (×2): 81 mg via ORAL
  Filled 2023-12-21 (×2): qty 1

## 2023-12-21 MED ORDER — OXYCODONE HCL 5 MG PO TABS: 5.0000 mg | ORAL_TABLET | ORAL | Status: DC | PRN

## 2023-12-21 MED ORDER — SODIUM CHLORIDE 0.9 % IV SOLN
1.0000 g | INTRAVENOUS | Status: DC
  Administered 2023-12-21 – 2023-12-22 (×2): 1 g via INTRAVENOUS
  Filled 2023-12-21 (×2): qty 10

## 2023-12-21 MED ORDER — ONDANSETRON HCL 4 MG/2ML IJ SOLN: 4.0000 mg | Freq: Four times a day (QID) | INTRAMUSCULAR | Status: DC | PRN

## 2023-12-21 MED ORDER — INSULIN ASPART 100 UNIT/ML IJ SOLN: 0.0000 [IU] | Freq: Three times a day (TID) | INTRAMUSCULAR | Status: DC

## 2023-12-21 MED ORDER — SODIUM CHLORIDE 0.9% FLUSH
3.0000 mL | Freq: Two times a day (BID) | INTRAVENOUS | Status: DC
  Administered 2023-12-21 – 2023-12-22 (×2): 3 mL via INTRAVENOUS

## 2023-12-21 MED ORDER — SENNOSIDES-DOCUSATE SODIUM 8.6-50 MG PO TABS: 1.0000 | ORAL_TABLET | Freq: Every evening | ORAL | Status: DC | PRN

## 2023-12-21 MED ORDER — HEPARIN SODIUM (PORCINE) 5000 UNIT/ML IJ SOLN
5000.0000 [IU] | Freq: Three times a day (TID) | INTRAMUSCULAR | Status: DC
  Administered 2023-12-21: 5000 [IU] via SUBCUTANEOUS
  Filled 2023-12-21: qty 1

## 2023-12-21 MED ORDER — HYDRALAZINE HCL 20 MG/ML IJ SOLN
10.0000 mg | INTRAMUSCULAR | Status: DC | PRN
Start: 2023-12-21 — End: 2023-12-22

## 2023-12-21 MED ORDER — AZITHROMYCIN 250 MG PO TABS
500.0000 mg | ORAL_TABLET | ORAL | Status: DC
  Administered 2023-12-21 – 2023-12-22 (×2): 500 mg via ORAL
  Filled 2023-12-21 (×3): qty 2

## 2023-12-21 NOTE — Assessment & Plan Note (Signed)
-   Currently stable, denies any chest pain -Reviewing home medication, continue: Aspirin , statins, beta-blockers

## 2023-12-21 NOTE — Assessment & Plan Note (Signed)
-   Stable continue aspirin , statin

## 2023-12-21 NOTE — Evaluation (Signed)
 Clinical/Bedside Swallow Evaluation Patient Details  Name: Natasha Hernandez MRN: [id number] Date of Birth: [birth year]  Today's Date: 12/21/2023 Time: SLP Start Time (ACUTE ONLY): 1532 SLP Stop Time (ACUTE ONLY): 1548 SLP Time Calculation (min) (ACUTE ONLY): 16 min  Past Medical History:  Past Medical History:  Diagnosis Date   AKI (acute kidney injury) (HCC) 04/16/2018   Arthritis    Carotid artery occlusion    Cataract    Had cataract surgery sometime before 2017   Coronary artery disease    CABG in 1996. Most recent cardiac catheterization in 2013 showed patent grafts including LIMA to LAD, SVG to OM, SVG to diagonal and SVG to PDA.   Diabetes mellitus without complication (HCC)    GERD (gastroesophageal reflux disease)    Hyperlipidemia    Hypertension    Neuromuscular disorder (HCC)    Osteoporosis    PAD (peripheral artery disease) (HCC)    Pain in finger of right hand 04/13/2016   Pneumonia due to COVID-19 virus 03/27/2019   Past Surgical History:  Past Surgical History:  Procedure Laterality Date   BREAST SURGERY     Mastectomy before 1995   CARDIAC CATHETERIZATION     CORONARY ARTERY BYPASS GRAFT  1996   EYE SURGERY     FRACTURE SURGERY  03/2016   Broke wrist   HPI:  Natasha Hernandez is a 83 year old female with history of CAD status post CABG, DM  , HTN,HLD, PAD presenting to [facility] with cough and shortness of breath. She was found Hypoxic 85% on room air and placed on 2 L [place]. No fever or leukocytosis. CXR Findings consistent with  bronchitis with potential for aspiration component. BSE requested    Assessment / Plan / Recommendation  Clinical Impression  Clinical swallowing evaluation complete while Pt was sitting upright in chair with daughter-in-law present at bedside. Pt denies swallowing difficulty. Pt without overt s/sx of oropharyngeal dysphagia with any textures/consistencies presented. Recommend continue with regular/thin diet. SLP reviewed  universal aspiration precautions with Pt. There are no further ST needs indicated at this time, our service will sign off. Thank you for this referral. SLP Visit Diagnosis: Dysphagia, unspecified (R13.10)       Diet Recommendation Regular;Thin liquid    Liquid Administration via: Cup;Straw Medication Administration: Whole meds with liquid Supervision: Patient able to self feed Postural Changes: Seated upright at 90 degrees    Other  Recommendations Oral Care Recommendations: Oral care BID       Swallow Study   General Date of Onset: 12/20/23 HPI: Natasha Hernandez is a 83 year old female with history of CAD status post CABG, DM  , HTN,HLD, PAD presenting to [facility] ED with cough and shortness of breath. She was found Hypoxic 85% on room air and placed on 2 L [place]. No fever or leukocytosis. CXR Findings consistent with  bronchitis with potential for aspiration component. BSE requested Type of Study: Bedside Swallow Evaluation Previous Swallow Assessment: none in chart Diet Prior to this Study: Regular;Thin liquids (Level 0) Temperature Spikes Noted: No Respiratory Status: Room air History of Recent Intubation: No Behavior/Cognition: Alert;Cooperative;Pleasant mood Oral Cavity Assessment: Within Functional Limits Oral Care Completed by SLP: Recent completion by staff Oral Cavity - Dentition: Adequate natural dentition Vision: Functional for self-feeding Self-Feeding Abilities: Able to feed self Patient Positioning: Upright in chair Baseline Vocal Quality: Normal Volitional Cough: Strong Volitional Swallow: Able to elicit    Oral/Motor/Sensory Function Overall Oral Motor/Sensory Function: Within functional limits   Ice Chips  Ice chips: Within functional limits   Thin Liquid Thin Liquid: Within functional limits    Nectar Thick Nectar Thick Liquid: Not tested   Honey Thick Honey Thick Liquid: Not tested   Puree Puree: Within functional limits   Solid     Solid: Within  functional limits     Natasha Hernandez, CCC-SLP Speech Language Pathologist  Natasha Hernandez 12/21/2023,4:06 PM

## 2023-12-21 NOTE — ED Provider Notes (Signed)
 2345hrs Care assumed at shift change awaiting call back from Hospitalist for admission.   80 Spoke with Dr. Alfornia who will accept for admission.    Roselyn Carlin NOVAK, MD 12/21/23 [phone]

## 2023-12-21 NOTE — Assessment & Plan Note (Signed)
 Management as above

## 2023-12-21 NOTE — Assessment & Plan Note (Signed)
-   Once stable consult PT OT for evaluation

## 2023-12-21 NOTE — Plan of Care (Signed)
  Problem: Acute Rehab PT Goals(only PT should resolve) Goal: Pt Will Go Supine/Side To Sit Outcome: Progressing Flowsheets (Taken 12/21/2023 1550) Pt will go Supine/Side to Sit: with modified independence Goal: Patient Will Transfer Sit To/From Stand Outcome: Progressing Flowsheets (Taken 12/21/2023 1550) Patient will transfer sit to/from stand: with minimal assist Goal: Pt Will Transfer Bed To Chair/Chair To Bed Outcome: Progressing Flowsheets (Taken 12/21/2023 1550) Pt will Transfer Bed to Chair/Chair to Bed: with min assist Goal: Pt Will Ambulate Outcome: Progressing Flowsheets (Taken 12/21/2023 1550) Pt will Ambulate:  with contact guard assist  50 feet  75 feet  with rolling walker

## 2023-12-21 NOTE — Plan of Care (Signed)
 Plan of Care Note for accepted transfer   Patient name: Natasha Hernandez FMW:[id number] DOB: 14-Jul-1940  Facility requesting transfer: [facility] Requesting Provider: Dr. Roselyn Facility course: 83 year old female with history of CAD status post CABG, type 2 diabetes, hypertension, hyperlipidemia, PAD presenting with cough and shortness of breath.  Hypoxic to 85% on room air and placed on 2 L [place].  No fever or leukocytosis.  COVID/influenza/RSV PCR negative.    CTA chest showing: IMPRESSION: 1. No evidence of arterial embolus. 2. Aortic and coronary artery atherosclerosis. 3. Small left and minimal right pleural effusions with no overt pulmonary edema. 4. Diffuse bronchial thickening with fluid in both main bronchi extending into both lower lobes, with segmental and subsegmental bronchial impactions in both lower lobes. Findings consistent with bronchitis with potential for aspiration component. 5. Perihilar ground-glass opacity in the right upper lobe base, and peripheral ground-glass opacities anteriorly in the left upper lobe. Findings likely due to multifocal pneumonitis. 6. Increased prominence of mediastinal and right hilar lymph nodes, probably reactive. 7. Small hiatal hernia with increased distal esophageal thickening. Consider endoscopic follow-up. 8. Osteopenia and kyphoscoliosis with advanced degenerative change.   Aortic Atherosclerosis (ICD10-I70.0).  Patient was given ceftriaxone  and azithromycin .  Plan of care: The patient is accepted for admission to Telemetry unit at [facility].  [facility] will assume care on arrival to accepting facility. Until arrival, care as per EDP. However, [facility] available 24/7 for questions and assistance.  Check [email or web address] for on-call coverage.  Nursing staff, please call [facility] Admits & Consults System-Wide number under Amion on patient's arrival so appropriate admitting provider can evaluate the pt.

## 2023-12-21 NOTE — Evaluation (Signed)
 Physical Therapy Evaluation Patient Details Name: Natasha Hernandez MRN: [id number] DOB: 1941-02-24 Today's Date: 12/21/2023  History of Present Illness  Natasha Hernandez is a 83 year old female with history of CAD status post CABG, DM  , HTN,HLD, PAD presenting to [facility] with cough and shortness of breath. She was found Hypoxic 85% on room air and placed on 2 L [place]. No fever or leukocytosis. .   Clinical Impression  Patient demonstrates slow labored movement for sitting up at bedside requiring increased time and CGA for significant posterior lean in upright sitting likely secondary to core weakness. Patient demonstrates increased difficulty with STS transfers, requiring modA to boost from standard height surfaces secondary to BIL LE weakness and poor motor planning. Patient requires additional time to complete tasks with frequent verbal and tactile cues and multiple attempts to power up. Patient able to perform short distance ambulation using RW with balance deficits noted, though no observable loss of balance, SpO2 maintained >90% on RA with walking. Patient will benefit from continued skilled physical therapy in hospital and recommended venue below to increase strength, balance, endurance for safe ADLs and gait.         If plan is discharge home, recommend the following: A lot of help with walking and/or transfers;Help with stairs or ramp for entrance;Assist for transportation;Assistance with cooking/housework;A lot of help with bathing/dressing/bathroom   Can travel by private vehicle        Equipment Recommendations None recommended by PT  Recommendations for Other Services       Functional Status Assessment Patient has had a recent decline in their functional status and demonstrates the ability to make significant improvements in function in a reasonable and predictable amount of time.     Precautions / Restrictions Precautions Precautions: Fall Recall of  Precautions/Restrictions: Impaired Restrictions Weight Bearing Restrictions Per Provider Order: No      Mobility  Bed Mobility Overal bed mobility: Needs Assistance Bed Mobility: Supine to Sit     Supine to sit: Contact guard     General bed mobility comments: increased time,  labored movement; CGA due to significant posterior lean    Transfers Overall transfer level: Needs assistance Equipment used: Rolling walker (2 wheels) Transfers: Sit to/from Stand, Bed to chair/wheelchair/BSC Sit to Stand: Mod assist   Step pivot transfers: Mod assist       General transfer comment: modA to boost for STS, unsteady; labored movement, frequent verbal and tactile cues provided for walker navigation    Ambulation/Gait Ambulation/Gait assistance: Min assist Gait Distance (Feet): 30 Feet Assistive device: Rolling walker (2 wheels) Gait Pattern/deviations: Step-through pattern, Decreased step length - right, Decreased step length - left, Decreased stride length, Trunk flexed Gait velocity: Decreased     General Gait Details: slightly unsteady, minA for walker naviagation; Heavy reliance on UE support; SpO2 maintained >90% with exertion  Stairs            Wheelchair Mobility     Tilt Bed    Modified Rankin (Stroke Patients Only)       Balance Overall balance assessment: Needs assistance Sitting-balance support: Feet supported, Bilateral upper extremity supported Sitting balance-Leahy Scale: Poor Sitting balance - Comments: seated EOB Postural control: Posterior lean Standing balance support: Bilateral upper extremity supported, During functional activity, Reliant on assistive device for balance Standing balance-Leahy Scale: Poor Standing balance comment: poor/fair using RW  Pertinent Vitals/Pain Pain Assessment Pain Assessment: Faces Faces Pain Scale: Hurts a little bit Pain Location: low back Pain Descriptors /  Indicators: Aching, Discomfort Pain Intervention(s): Monitored during session, Repositioned    Home Living Family/patient expects to be discharged to:: Private residence Living Arrangements: Children Available Help at Discharge: Available 24 hours/day Type of Home: House Home Access: Stairs to enter Entrance Stairs-Rails: None Entrance Stairs-Number of Steps: 2   Home Layout: One level Home Equipment: Cane - single point;Grab bars - tub/shower;Rolling Walker (2 wheels);Wheelchair - manual Additional Comments: History received from patient and daughter-in-law    Prior Function Prior Level of Function : Needs assist       Physical Assist : ADLs (physical)   ADLs (physical): IADLs Mobility Comments: short distance ambulator using walking stick, RW for longer distances ADLs Comments: Patient reports independence with ADLs, daughter in law assist PRN; daughter in law does IADLs     Extremity/Trunk Assessment   Upper Extremity Assessment Upper Extremity Assessment: Defer to OT evaluation    Lower Extremity Assessment Lower Extremity Assessment: Generalized weakness    Cervical / Trunk Assessment Cervical / Trunk Assessment: Kyphotic  Communication   Communication Communication: No apparent difficulties    Cognition Arousal: Alert Behavior During Therapy: WFL for tasks assessed/performed   PT - Cognitive impairments: History of cognitive impairments                       PT - Cognition Comments: Per family, cognition deficits at baseline Following commands: Impaired Following commands impaired: Follows one step commands with increased time     Cueing Cueing Techniques: Verbal cues, Tactile cues     General Comments      Exercises     Assessment/Plan    PT Assessment Patient needs continued PT services  PT Problem List Decreased strength;Decreased mobility;Decreased coordination;Decreased cognition;Decreased balance;Decreased activity tolerance        PT Treatment Interventions DME instruction;Therapeutic activities;Patient/family education;Balance training;Functional mobility training;Gait training;Therapeutic exercise    PT Goals (Current goals can be found in the Care Plan section)  Acute Rehab PT Goals Patient Stated Goal: return home PT Goal Formulation: With patient/family Time For Goal Achievement: 01/04/24 Potential to Achieve Goals: Good    Frequency Min 3X/week     Co-evaluation               AM-PAC PT 6 Clicks Mobility  Outcome Measure Help needed turning from your back to your side while in a flat bed without using bedrails?: A Little Help needed moving from lying on your back to sitting on the side of a flat bed without using bedrails?: A Little Help needed moving to and from a bed to a chair (including a wheelchair)?: A Lot Help needed standing up from a chair using your arms (e.g., wheelchair or bedside chair)?: A Lot Help needed to walk in hospital room?: A Lot Help needed climbing 3-5 steps with a railing? : A Lot 6 Click Score: 14    End of Session Equipment Utilized During Treatment: Gait belt Activity Tolerance: Patient tolerated treatment well Patient left: in chair;with family/visitor present;with call bell/phone within reach;with chair alarm set Nurse Communication: Mobility status PT Visit Diagnosis: Unsteadiness on feet (R26.81);Other abnormalities of gait and mobility (R26.89);Muscle weakness (generalized) (M62.81)    Time: 8581-8550 PT Time Calculation (min) (ACUTE ONLY): 31 min   Charges:   PT Evaluation $PT Eval Moderate Complexity: 1 Mod PT Treatments $Therapeutic Activity: 23-37 mins PT General Charges $$  ACUTE PT VISIT: 1 Visit         3:50 PM, 12/21/23,  Sumedh Shinsato, SPT

## 2023-12-21 NOTE — Assessment & Plan Note (Signed)
Continue aspirin, statins. ?

## 2023-12-21 NOTE — Hospital Course (Signed)
 Natasha Hernandez is a 83 year old female with history of CAD status post CABG, DM  , HTN,HLD, PAD presenting to [facility] with cough and shortness of breath. She was found Hypoxic 85% on room air and placed on 2 L . No fever or leukocytosis. .   ED Evaluation:  Blood pressure (!) 141/54, pulse 78, temperature 98.8 F (37.1 C), temperature source Oral, resp. rate 15, height 5' 4 (1.626 m), weight 74.8 kg, SpO2 96% on 2 L   LABs: WBC 5.4, Hb 11.5, chloride 96, glucose 147, COVID/influenza/RSV PCR negative  CTA: Negative for PE, small left, minimal right pleural effusion  Perihilar ground-glass opacity in the right upper lobe base, and peripheral ground-glass opacities anteriorly in the left upper lobe. Findings likely due to multifocal pneumonitis.  ETP had discussion with patient patient agreeable to be admitted to [facility] for pneumonia, and acute respiratory failure.

## 2023-12-21 NOTE — ED Notes (Signed)
 Called [facility] to transport the patient to [id number]

## 2023-12-21 NOTE — ED Notes (Signed)
 Report given via phone to [employer] RN

## 2023-12-21 NOTE — Assessment & Plan Note (Signed)
-   Will review home medication currently not on any neuropathic medications

## 2023-12-21 NOTE — Progress Notes (Signed)
 Patient received a call from [place] stating he's the patients son and wanted to speak with her, the phone call was muted and this nurse asked patient who is Rosalva, patient stated its her son, I asked her if she wanted to speak with him and she stated yes. Patient then spoke with son over the phone and told him she has pneumonia but doesn't need him to come and visit. Daughter in law arrived bedside and informed this nurse that she will inform son rosalva of health related matters however she doesn't want him to be spoken to by staff, I informed her that patient confirmed she wished to speak with him at the time the call was transferred to me which is the only reason they were able to speak to one another. Daughter in law spoke with this nurse outside of patients room.

## 2023-12-21 NOTE — Assessment & Plan Note (Signed)
-   Continue aspirin , statins, beta-blockers

## 2023-12-21 NOTE — Assessment & Plan Note (Signed)
-   BP stable, will resume home medication of lisinopril , Coreg , -Will monitor closely

## 2023-12-21 NOTE — Inpatient Diabetes Management (Signed)
 Inpatient Diabetes Program Recommendations  AACE/ADA: New Consensus Statement on Inpatient Glycemic Control (2015)  Target Ranges:  Prepandial:   less than 140 mg/dL      Peak postprandial:   less than 180 mg/dL (1-2 hours)      Critically ill patients:  140 - 180 mg/dL   Lab Results  Component Value Date   GLUCAP 104 (H) 12/21/2023   HGBA1C 8.0 (A) 10/13/2023    Review of Glycemic Control  Diabetes history: T1DM  Outpatient Diabetes medications:  OmniPod 5 insulin  pump with Dexcom G6 CGM, using Admelog  U100  Insulin  Pump setting:  Basal ( 18.1 units / day)  MN-0.7 u/hour  2:30AM-0.6  6AM-0.8  3PM-0.8  6PM-  0.85  9PM-   0.7  Bolus  CHO Ratio (1unit:CHO)  MN-1:8  11AM-1:8  3PM-   1:8  Correction/Sensitivity:  1:30  Target: 120-150   Active insulin  time: 2.5 hours   Current orders for Inpatient glycemic control: Insulin  Pump Therapy and Novolog  0-15 units TID  Inpatient Diabetes Program Recommendations:    If patient is confused and not alert and oriented x 4,  her insulin  pump should be removed and basal/bolus/correction started.  Messaged RN and she says daughter is managing her pump.  If daughter stays 24 hours with patient that should be ok.  If not, the pump should be removed.  Please consider:  Semglee  15 units every day (allow pump to run for 1 hour after Semglee  administered and then remove)  Novolog  0-9 units Q4H  If daughter stays and manages pump, please discontinue Novolog  0-15 units TID and keep insulin  pump therapy order set.    Thank you, Wyvonna Pinal, MSN, CDCES Diabetes Coordinator Inpatient Diabetes Program [phone] (team pager from 8a-5p)

## 2023-12-21 NOTE — Assessment & Plan Note (Signed)
-   Will resume patient's insulin  pump -Will check her blood sugar q. ACHS, along with self correction if need be with sliding scale insulin

## 2023-12-21 NOTE — H&P (Addendum)
 History and Physical   Patient: Natasha Hernandez                            PCP: Natasha Roselie Rockford, NP                    DOB: [birth year]            DOA: 12/20/2023 FMW:[id number]             DOS: 12/21/2023, 1:58 PM  Nche, Roselie Rockford, NP  Patient coming from:   HOME  I have personally reviewed patient's medical records, in electronic medical records, including:  [facility], and care everywhere.    Chief Complaint:   Chief Complaint  Patient presents with   Shortness of Breath   Cough    History of present illness:    Natasha Hernandez is a 83 year old female with history of CAD status post CABG, DM  , HTN,HLD, PAD presenting to [facility] with cough and shortness of breath. She was found Hypoxic 85% on room air and placed on 2 L [place]. No fever or leukocytosis. .   ED Evaluation:  Blood pressure (!) 141/54, pulse 78, temperature 98.8 F (37.1 C), temperature source Oral, resp. rate 15, height 5' 4 (1.626 m), weight 74.8 kg, SpO2 96% on 2 L   LABs: WBC 5.4, Hb 11.5, chloride 96, glucose 147, COVID/influenza/RSV PCR negative  CTA: Negative for PE, small left, minimal right pleural effusion  Perihilar ground-glass opacity in the right upper lobe base, and peripheral ground-glass opacities anteriorly in the left upper lobe. Findings likely due to multifocal pneumonitis.  ETP had discussion with patient patient agreeable to be admitted to [name] for pneumonia, and acute respiratory failure.     Patient Denies having: Fever, Chills, Cough, SOB, Chest Pain, Abd pain, N/V/D, headache, dizziness, lightheadedness,  Dysuria, Joint pain, rash, open wounds    Review of Systems: As per HPI, otherwise 10 point review of systems were negative.   ----------------------------------------------------------------------------------------------------------------------  No Known Allergies  Home MEDs:  Prior to Admission medications   Medication Sig Start Date End Date  Taking? Authorizing Provider  ADMELOG  100 UNIT/ML injection INJECT 40 UNITS INTO THE SKIN DAILY. VIA PUMP 12/12/23  Yes Thapa, [place], MD  aspirin  EC 81 MG tablet Take 1 tablet (81 mg total) by mouth daily. 03/25/17  Yes Darron Deatrice LABOR, MD  Calcium -Magnesium-Vitamin D (CALCIUM  MAGNESIUM PO) Take 1 tablet by mouth daily.   Yes [provider]  carvedilol  (COREG ) 6.25 MG tablet Take 1 tablet (6.25 mg total) by mouth 2 (two) times daily with a meal. 02/28/23  Yes Agbor-Etang, Redell, MD  Coenzyme Q10 (CO Q 10 PO) Take 1 capsule by mouth daily.   Yes [provider]  Continuous Blood Gluc Sensor (DEXCOM G6 SENSOR) MISC 1 each by Does not apply route See admin instructions. Use one sensor once every 10 days to monitor blood sugars. 01/12/22  Yes Von Pacific, MD  Continuous Blood Gluc Transmit (DEXCOM G6 TRANSMITTER) MISC 1 each by Does not apply route See admin instructions. Use one transmitter once every 90 days to transmit blood sugars.   Yes [provider]  dextromethorphan-guaiFENesin (MUCINEX DM) 30-600 MG 12hr tablet Take 1 tablet by mouth 2 (two) times daily.   Yes [provider]  doxycycline  (ADOXA) 100 MG tablet Take 100 mg by mouth 2 (two) times daily.   Yes [provider]  ezetimibe  (ZETIA ) 10 MG tablet Take 1 tablet by mouth daily. 05/30/23  Yes Thapa, [place], MD  Insulin  Disposable Pump (OMNIPOD 5 DEXG7G6 PODS GEN 5) MISC APPLY EVERY 3 DAYS 06/09/23  Yes Thapa, [place], MD  Insulin  Disposable Pump (OMNIPOD 5 G6 INTRO, GEN 5,) KIT See admin instructions. 02/26/21  Yes [provider]  isosorbide  mononitrate (IMDUR ) 30 MG 24 hr tablet Take 1 tablet by mouth twice daily. 11/25/23  Yes Darron Deatrice LABOR, MD  lisinopril  (ZESTRIL ) 10 MG tablet Take 1 tablet by mouth daily. 10/26/23  Yes Thapa, [place], MD  Multiple Vitamin (MULTIVITAMIN) tablet Take 1 tablet by mouth daily.   Yes [provider]  rosuvastatin  (CRESTOR ) 40 MG tablet Take 1 tablet  by mouth daily. 05/30/23  Yes Thapa, [place], MD  gentamicin  cream (GARAMYCIN ) 0.1 % Apply 1 Application topically 2 (two) times daily. Patient not taking: Reported on 12/16/2023 01/25/23   Janit Thresa HERO, DPM  Glucagon  (GVOKE HYPOPEN  1-PACK) 1 MG/0.2ML SOAJ Inject 1 mg into the skin as needed (low blood sugar with impaired consciousness). 01/07/23   Thapa, [place], MD  glucose blood test strip Use Contour Next test strips as instructed to check blood sugar 4 times daily. DX:E10.65 05/30/19   Von Pacific, MD    PRN MEDs: acetaminophen  **OR** acetaminophen , bisacodyl , hydrALAZINE , HYDROmorphone  (DILAUDID ) injection, ipratropium, levalbuterol , ondansetron  **OR** ondansetron  (ZOFRAN ) IV, oxyCODONE , senna-docusate, sodium phosphate , traZODone   Past Medical History:  Diagnosis Date   AKI (acute kidney injury) (HCC) 04/16/2018   Arthritis    Carotid artery occlusion    Cataract    Had cataract surgery sometime before 2017   Coronary artery disease    CABG in 1996. Most recent cardiac catheterization in 2013 showed patent grafts including LIMA to LAD, SVG to OM, SVG to diagonal and SVG to PDA.   Diabetes mellitus without complication (HCC)    GERD (gastroesophageal reflux disease)    Hyperlipidemia    Hypertension    Neuromuscular disorder (HCC)    Osteoporosis    PAD (peripheral artery disease) (HCC)    Pain in finger of right hand 04/13/2016   Pneumonia due to COVID-19 virus 03/27/2019    Past Surgical History:  Procedure Laterality Date   BREAST SURGERY     Mastectomy before 1995   CARDIAC CATHETERIZATION     CORONARY ARTERY BYPASS GRAFT  1996   EYE SURGERY     FRACTURE SURGERY  03/2016   Broke wrist     reports that she has never smoked. She has never used smokeless tobacco. She reports that she does not drink alcohol and does not use drugs.   Family History  Problem Relation Age of Onset   Breast cancer Mother 47   Stroke Mother    Emphysema Father 57   Heart attack Father     Diabetes Paternal Grandfather     Physical Exam:   Vitals:   12/21/23 1000 12/21/23 1030 12/21/23 1222 12/21/23 1337  BP: (!) 150/59  136/84 (!) 149/54  Pulse:   77 78  Resp: 18  16 18   Temp:  98.8 F (37.1 C)    TempSrc:  Oral  Oral  SpO2:   100% 99%  Weight:      Height:       Constitutional: NAD, calm, comfortable Eyes: PERRL, lids and conjunctivae normal ENMT: Hard of hearing - mucous membranes are moist. Posterior pharynx clear of any exudate or lesions.Normal dentition.  Neck: normal, supple, no masses, no thyromegaly  Respiratory: clear to auscultation bilaterally, no wheezing, no crackles. Normal respiratory effort. No accessory muscle use.  Cardiovascular: Regular rate and rhythm, no murmurs / rubs / gallops. No extremity edema. 2+ pedal pulses. No carotid bruits.  Abdomen: no tenderness, no masses palpated. No hepatosplenomegaly. Bowel sounds positive.  Musculoskeletal: no clubbing / cyanosis. No joint deformity upper and lower extremities. Good ROM, no contractures. Normal muscle tone.  Neurologic: CN II-XII grossly intact. Sensation intact, DTR normal. Strength 5/5 in all 4.  Psychiatric: Normal judgment and insight. Alert and oriented x 3. Normal mood.  Skin: no rashes, lesions, ulcers. No induration        Labs on admission:    I have personally reviewed following labs and imaging studies  CBC: Recent Labs  Lab 12/20/23 2018  WBC 5.4  HGB 11.5*  HCT 35.2*  MCV 100.9*  PLT 254   Basic Metabolic Panel: Recent Labs  Lab 12/20/23 2018 12/21/23 1056  NA 135  --   K 4.4  --   CL 96*  --   CO2 29  --   GLUCOSE 147*  --   BUN 14  --   CREATININE 0.76  --   CALCIUM  9.8  --   MG  --  2.3  PHOS  --  4.3    Coagulation Profile: Recent Labs  Lab 12/20/23 2018  INR 0.9    Urine analysis:    Component Value Date/Time   COLORURINE YELLOW 10/24/2022 1630   APPEARANCEUR HAZY (A) 10/24/2022 1630   LABSPEC 1.011 10/24/2022 1630   PHURINE 6.0  10/24/2022 1630   GLUCOSEU NEGATIVE 10/24/2022 1630   GLUCOSEU 100 (A) 05/04/2016 1535   HGBUR NEGATIVE 10/24/2022 1630   BILIRUBINUR NEGATIVE 10/24/2022 1630   KETONESUR NEGATIVE 10/24/2022 1630   PROTEINUR NEGATIVE 10/24/2022 1630   UROBILINOGEN 0.2 05/04/2016 1535   NITRITE NEGATIVE 10/24/2022 1630   LEUKOCYTESUR LARGE (A) 10/24/2022 1630    Last A1C:  Lab Results  Component Value Date   HGBA1C 8.0 (A) 10/13/2023     Radiologic Exams on Admission:   CT Angio Chest PE W and/or Wo Contrast Result Date: 12/20/2023 CLINICAL DATA:  Shortness of breath and coughing, no improvement on antibiotics. Pulmonary embolism suspected versus pneumonia. EXAM: CT ANGIOGRAPHY CHEST WITH CONTRAST TECHNIQUE: Multidetector CT imaging of the chest was performed using the standard protocol during bolus administration of intravenous contrast. Multiplanar CT image reconstructions and MIPs were obtained to evaluate the vascular anatomy. RADIATION DOSE REDUCTION: This exam was performed according to the departmental dose-optimization program which includes automated exposure control, adjustment of the mA and/or kV according to patient size and/or use of iterative reconstruction technique. CONTRAST:  75mL OMNIPAQUE  IOHEXOL  350 MG/ML SOLN COMPARISON:  [facility] and lateral chest from today, chest radiograph 04/06/2020, CTA chest 04/05/2023, and a more recent CTA abdomen and pelvis of 10/24/2022. FINDINGS: Cardiovascular: There is diagnostic pulmonary arterial opacification. No arterial embolus is seen. Pulmonary trunk is upper normal in caliber but unchanged. There are calcific plaques in the aorta and heavily in the coronary arteries. No aortic or great vessel aneurysm, stenosis or dissection. There are CABG changes and a healed sternotomy, chronic. No significant pericardial fluid. No pulmonary venous dilatation. Mediastinum/Nodes: There are increasingly prominent precarinal lymph nodes up to 9 mm in short axis, mildly  prominent right hilar lymph nodes up to 1.1 cm, a subcarinal node measuring 1.1 cm also increased. Small hiatal hernia. The esophagus is patulous, distally with increased thickening. Consider endoscopic follow-up. Negative  appearance thyroid  gland, thoracic trachea and the axillary spaces. Lungs/Pleura: Small left and minimal layering right pleural effusions. No overt pulmonary edema. There are biapical pleuroparenchymal scarring changes. Calcified granulomas posteriorly in the right lower lobe. There is layering fluid in both main bronchi extending into both lower lobes, with segmental and subsegmental bronchial impactions in both lower lobes in the basal segments. Diffuse bronchial thickening. There is perihilar ground-glass opacity in the right upper lobe base, mid chest level. Peripheral ground-glass opacities noted anteriorly in the left upper lobe. Roughly rectangular left upper lobe nodule laterally is seen measuring 1.0 x 0.6 cm, new. This is probably an inflammatory nodule but will require follow-up. Posterior atelectatic changes are present in both lower lobes without focal consolidation. The lungs are otherwise clear.  No pneumothorax. Upper Abdomen: Numerous calcified splenic granulomas. There are occasional hepatic calcified granulomas. No acute upper abdominal findings. Abdominal aortic visceral branch vessel atherosclerosis. Musculoskeletal: Osteopenia with advanced degenerative change of the thoracic spine, mild thoracic kyphodextroscoliosis and multilevel interbody thoracic spine ankylosis. No acute or other significant osseous findings. Prior left mastectomy. No chest wall mass is seen. Review of the MIP images confirms the above findings. IMPRESSION: 1. No evidence of arterial embolus. 2. Aortic and coronary artery atherosclerosis. 3. Small left and minimal right pleural effusions with no overt pulmonary edema. 4. Diffuse bronchial thickening with fluid in both main bronchi extending into both  lower lobes, with segmental and subsegmental bronchial impactions in both lower lobes. Findings consistent with bronchitis with potential for aspiration component. 5. Perihilar ground-glass opacity in the right upper lobe base, and peripheral ground-glass opacities anteriorly in the left upper lobe. Findings likely due to multifocal pneumonitis. 6. Increased prominence of mediastinal and right hilar lymph nodes, probably reactive. 7. Small hiatal hernia with increased distal esophageal thickening. Consider endoscopic follow-up. 8. Osteopenia and kyphoscoliosis with advanced degenerative change. Aortic Atherosclerosis (ICD10-I70.0). Electronically Signed   By: Francis Quam M.D.   On: 12/20/2023 23:10   DG Chest 2 View Result Date: 12/20/2023 CLINICAL DATA:  shortness of breath EXAM: CHEST - 2 VIEW COMPARISON:  April 06, 2020 FINDINGS: Chronic coarsening of the pulmonary interstitium without overt pulmonary edema. No focal airspace consolidation or pneumothorax. Trace left pleural effusion. Mild cardiomegaly. Sternotomy wires and CABG markers. Aortic atherosclerosis. no acute fracture or destructive lesions. Multilevel thoracic osteophytosis. IMPRESSION: Trace left pleural effusion. Electronically Signed   By: Rogelia Myers M.D.   On: 12/20/2023 20:10    EKG:   Independently reviewed.  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)   Essential hypertension   Hx of CABG   Right carotid artery occlusion   PAD (peripheral artery disease) (HCC)    Type 1 diabetes mellitus (HCC)   Unsteady gait   Pneumonia   Assessment and Plan: * Acute respiratory failure with hypoxia (HCC) - Acute respiratory failure, satting 85% on room air on arrival in ED -likely due to multifocal pneumonia -Currently satting 96% on 2 L of oxygen -Continue DuoNeb bronchodilator as needed -Continue mucolytics, spirometer, flutter valve -Continuing antibiotics -Will determine for need of IV steroids  CAP (community acquired pneumonia) - Community-acquired  pneumonia versus aspiration pneumonia -Currently stable, afebrile, normotensive, satting 96% on 2 L of oxygen, -POA 85% on room air -Patient has been initiated on IV antibiotics of Rocephin /azithromycin  -will continue for now -Will follow-up with blood/sputum cultures -Will evaluate for aspiration, may need speech evaluation   CAD (coronary artery disease) - Currently stable, denies any chest pain -Reviewing home medication, continue: Aspirin , statins, beta-blockers   Pneumonia Management as above  Unsteady gait - Once stable consult PT OT for evaluation  Type 1 diabetes mellitus (HCC) - Will resume patient's insulin  pump -Will check her blood sugar q. 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 Level of care: Telemetry   Family Communication:  none at bedside  (The above findings and plan of care has been discussed with patient in detail, the patient expressed understanding and agreement of above plan)  --------------------------------------------------------------------------------------------------------------------------------------------------  Disposition Plan:  Anticipated 1-2 days Status is: Inpatient Remains inpatient appropriate because: Needing IV antibiotics, IV fluids     ----------------------------------------------------------------------------------------------------------------------------------------------------  Time spent:  64  Min.  Was spent seeing and evaluating the patient, reviewing all medical records, drawn plan of care.  SIGNED: Adriana DELENA Grams, MD, FHM. FAAFP. [place] [facility], Pager  (Please use [email or web address] to page/ or secure chat through epic) If 7PM-7AM, please contact night-coverage [email or web address],  12/21/2023, 1:58 PM

## 2023-12-21 NOTE — ED Provider Notes (Signed)
  Physical Exam  BP (!) 141/54   Pulse 78   Temp 98.4 F (36.9 C) (Oral)   Resp 15   Ht 5' 4 (1.626 m)   Wt 74.8 kg   SpO2 96%   BMI 28.32 kg/m    Received notification from [facility] flow manager that beds are available at [facility]. Pt is amenable to admission to [facility], confirmed hospitalist, Dr. Twana, ok with change in admission plan. Asks for blood culture and sputum culture, which was ordered.        Jerrol Agent, MD 12/21/23 [phone]

## 2023-12-21 NOTE — Progress Notes (Signed)
   12/21/23 1517  TOC Brief Assessment  Insurance and Status Reviewed  Patient has primary care physician Yes  Home environment has been reviewed Home  Prior level of function: Independent  Prior/Current Home Services No current home services  Social Drivers of Health Review SDOH reviewed no interventions necessary  Readmission risk has been reviewed Yes  Transition of care needs no transition of care needs at this time   Transition of Care Department [facility]) has reviewed patient and no TOC needs have been identified at this time. We will continue to monitor patient advancement through interdisciplinary progression rounds. If new patient transition needs arise, please place a TOC consult.

## 2023-12-21 NOTE — Assessment & Plan Note (Addendum)
>>  ASSESSMENT AND PLAN FOR CAP (COMMUNITY ACQUIRED PNEUMONIA) WRITTEN ON 12/21/2023 10:36 AM BY Chaquetta Schlottman A, MD  - Community-acquired pneumonia versus aspiration pneumonia -Currently stable, afebrile, normotensive, satting 96% on 2 L of oxygen, -POA 85% on room air -Patient has been initiated on IV antibiotics of Rocephin /azithromycin  -will continue for now -Will follow-up with blood/sputum cultures -Will evaluate for aspiration, may need speech evaluation    >>ASSESSMENT AND PLAN FOR PNEUMONIA WRITTEN ON 12/21/2023 10:37 AM BY Delisia Mcquiston A, MD  Management as above

## 2023-12-21 NOTE — Progress Notes (Signed)
 Daughter in law will be present during all meals in order to cover patient based on carb intake and give insulin  through pump, all controlled by Daughter in law. MD made aware. Glucose tracker located on upper L arm, and insulin  pump located on upper R arm.

## 2023-12-21 NOTE — Assessment & Plan Note (Signed)
-   Acute respiratory failure, satting 85% on room air on arrival in ED -likely due to multifocal pneumonia -Currently satting 96% on 2 L of oxygen -Continue DuoNeb bronchodilator as needed -Continue mucolytics, spirometer, flutter valve -Continuing antibiotics -Will determine for need of IV steroids

## 2023-12-22 DIAGNOSIS — J9601 Acute respiratory failure with hypoxia: Secondary | ICD-10-CM | POA: Diagnosis not present

## 2023-12-22 LAB — BASIC METABOLIC PANEL WITH GFR
Anion gap: 9 (ref 5–15)
BUN: 13 mg/dL (ref 8–23)
CO2: 25 mmol/L (ref 22–32)
Calcium: 8.2 mg/dL — ABNORMAL LOW (ref 8.9–10.3)
Chloride: 102 mmol/L (ref 98–111)
Creatinine, Ser: 0.67 mg/dL (ref 0.44–1.00)
GFR, Estimated: >60 mL/min (ref >60)
Glucose, Bld: 109 mg/dL — ABNORMAL HIGH (ref 70–99)
Potassium: 3.7 mmol/L (ref 3.5–5.1)
Sodium: 136 mmol/L (ref 135–145)

## 2023-12-22 LAB — GLUCOSE, CAPILLARY
Glucose-Capillary: 135 mg/dL — ABNORMAL HIGH (ref 70–99)
Glucose-Capillary: 134 mg/dL — ABNORMAL HIGH (ref 70–99)
Glucose-Capillary: 209 mg/dL — ABNORMAL HIGH (ref 70–99)

## 2023-12-22 LAB — CBC
HCT: 33.4 % — ABNORMAL LOW (ref 36.0–46.0)
Hemoglobin: 10.7 g/dL — ABNORMAL LOW (ref 12.0–15.0)
MCH: 33.1 pg (ref 26.0–34.0)
MCHC: 32 g/dL (ref 30.0–36.0)
MCV: 103.4 fL — ABNORMAL HIGH (ref 80.0–100.0)
Platelets: 251 K/uL (ref 150–400)
RBC: 3.23 MIL/uL — ABNORMAL LOW (ref 3.87–5.11)
RDW: 14.1 % (ref 11.5–15.5)
WBC: 4.7 K/uL (ref 4.0–10.5)
nRBC: 0 % (ref 0.0–0.2)

## 2023-12-22 LAB — APTT: aPTT: 29 s (ref 24–36)

## 2023-12-22 MED ORDER — LEVOFLOXACIN 750 MG PO TABS: 750.0000 mg | ORAL_TABLET | Freq: Every day | ORAL | 0 refills | Status: AC

## 2023-12-22 MED ORDER — FLORANEX PO PACK
1.0000 g | PACK | Freq: Three times a day (TID) | ORAL | Status: DC
  Filled 2023-12-22: qty 1

## 2023-12-22 MED ORDER — LACTINEX PO CHEW: 1.0000 | CHEWABLE_TABLET | Freq: Three times a day (TID) | ORAL | 0 refills | Status: AC

## 2023-12-22 NOTE — Discharge Summary (Signed)
 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 *  Hospital Course: Natasha Hernandez is a 83 year old female with history of CAD status post CABG, DM  , HTN,HLD, PAD presenting to [facility] with cough and shortness of breath. She was found Hypoxic 85% on room air and placed on 2 L [place]. No fever or leukocytosis. .   ED Evaluation:  Blood pressure (!) 141/54, pulse 78, temperature 98.8 F (37.1 C), temperature source Oral, resp. rate 15, height 5' 4 (1.626 m), weight 74.8 kg, SpO2 96% on 2 L   LABs: WBC 5.4, Hb 11.5, chloride 96, glucose 147, COVID/influenza/RSV PCR negative  CTA: Negative for PE, small left, minimal right pleural effusion  Perihilar ground-glass opacity in the right upper lobe base, and peripheral ground-glass opacities anteriorly in the left upper lobe. Findings likely due to multifocal pneumonitis.  ETP had discussion with patient patient agreeable to be admitted to [facility] for pneumonia, and acute respiratory failure.   Assessment and Plan: * Acute respiratory failure with hypoxia (HCC) - Improved, continue to have  cough, mild congestion POA : satting 85% on room air on arrival in ED -likely due to multifocal pneumonia - was satting 96% on 2 L of oxygen>>> Taper off to room air, currently satting 92% on room air - As needed DuoNeb bronchodilator as needed -Continue mucolytics, spirometer, flutter valve -Continuing antibiotics : IV Rocephin /azithromycin  x 2 days switched to p.o. Levaquin    CAP (community acquired pneumonia) - Community-acquired pneumonia versus aspiration pneumonia -Currently stable, afebrile, normotensive, satting 92% on room air -POA 85% on room air -on 2 L was satting 96% weaned off to room air currently satting 92% -Patient has been initiated on IV antibiotics of Rocephin /azithromycin  x 2 days-switch to p.o. Levaquin  - Blood cultures-no growth to date - Sputum culture could not be obtained - Respiratory panel all negative (influenza A/B, RSV, COVID)    CAD (coronary artery disease) - Currently stable, denies any chest pain -Reviewing home medication, continue: Aspirin , statins, beta-blockers   Unsteady gait - Continue PT OT, fall precautions  Type 1 diabetes mellitus (HCC) - Will resume patient's insulin  pump -Good glycemic control-with assist of family at bedside   PAD (peripheral artery disease) (HCC) - Continue aspirin , statins  Right carotid artery occlusion - Stable continue aspirin , statin   Hx of CABG - Continue aspirin , statins, beta-blockers  Essential hypertension - BP stable, will resume home medication of lisinopril , Coreg ,   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) - Will review home medication currently not on any neuropathic medications   Disposition: Home health Diet recommendation:  Discharge Diet Orders (From admission, onward)  Start     Ordered   12/22/23 0000  Diet - low sodium heart healthy        12/22/23 1037           Cardiac and Carb modified diet DISCHARGE MEDICATION: Allergies  as of 12/22/2023   No Known Allergies      Medication List     STOP taking these medications    doxycycline  100 MG tablet Commonly known as: ADOXA   gentamicin  cream 0.1 % Commonly known as: GARAMYCIN        TAKE these medications    Admelog  100 UNIT/ML injection Generic drug: insulin  lispro INJECT 40 UNITS INTO THE SKIN DAILY. VIA PUMP   aspirin  EC 81 MG tablet Take 1 tablet (81 mg total) by mouth daily.   CALCIUM  MAGNESIUM PO Take 1 tablet by mouth daily.   carvedilol  6.25 MG tablet Commonly known as: COREG  Take 1 tablet (6.25 mg total) by mouth 2 (two) times daily with a meal.   CO Q 10 PO Take 1 capsule by mouth daily.   Dexcom G6 Sensor Misc 1 each by Does not apply route See admin instructions. Use one sensor once every 10 days to monitor blood sugars.   Dexcom G6 Transmitter Misc 1 each by Does not apply route See admin instructions. Use one transmitter once every 90 days to transmit blood sugars.   dextromethorphan-guaiFENesin 30-600 MG 12hr tablet Commonly known as: MUCINEX DM Take 1 tablet by mouth 2 (two) times daily.   ezetimibe  10 MG tablet Commonly known as: ZETIA  Take 1 tablet by mouth daily.   glucose blood test strip Use Contour Next test strips as instructed to check blood sugar 4 times daily. DX:E10.65   Gvoke HypoPen  1-Pack 1 MG/0.2ML Soaj Generic drug: Glucagon  Inject 1 mg into the skin as needed (low blood sugar with impaired consciousness).   isosorbide  mononitrate 30 MG 24 hr tablet Commonly known as: IMDUR  Take 1 tablet by mouth twice daily.   lactobacillus acidophilus & bulgar chewable tablet Chew 1 tablet by mouth 3 (three) times daily with meals for 5 days.   levofloxacin  750 MG tablet Commonly known as: Levaquin  Take 1 tablet (750 mg total) by mouth daily for 5 days. Start taking on: December 23, 2023   lisinopril  10 MG tablet Commonly known as: ZESTRIL  Take 1 tablet by mouth daily.   multivitamin tablet Take 1  tablet by mouth daily.   Omnipod 5 DexG7G6 Intro Gen 5 Kit See admin instructions.   Omnipod 5 DexG7G6 Pods Gen 5 Misc APPLY EVERY 3 DAYS   rosuvastatin  40 MG tablet Commonly known as: CRESTOR  Take 1 tablet by mouth daily.        Discharge Exam: Filed Weights   12/20/23 1945 12/21/23 1337 12/22/23 0500  Weight: 74.8 kg 74 kg 74.8 kg        General:  AAO x 3,  cooperative, no distress;  Hard of hearing  HEENT:  Normocephalic, PERRL, otherwise with in Normal limits   Neuro:  CNII-XII intact. , normal motor and sensation, reflexes intact   Lungs:   Clear to auscultation BL, Respirations unlabored,  No wheezes / crackles  Cardio:    S1/S2, RRR, No murmure, No Rubs or Gallops   Abdomen:  Soft, non-tender, bowel sounds active all four quadrants, no guarding or peritoneal signs.  Muscular  skeletal:  Limited exam -global generalized weaknesses - in bed, able to move all 4 extremities,   2+ pulses,  symmetric, No pitting  edema  Skin:  Dry, warm to touch, negative for any Rashes,  Wounds: Please see nursing documentation        Condition at discharge: fair  The results of significant diagnostics from this hospitalization (including imaging, microbiology, ancillary and laboratory) are listed below for reference.   Imaging Studies: CT Angio Chest PE W and/or Wo Contrast Result Date: 12/20/2023 CLINICAL DATA:  Shortness of breath and coughing, no improvement on antibiotics. Pulmonary embolism suspected versus pneumonia. EXAM: CT ANGIOGRAPHY CHEST WITH CONTRAST TECHNIQUE: Multidetector CT imaging of the chest was performed using the standard protocol during bolus administration of intravenous contrast. Multiplanar CT image reconstructions and MIPs were obtained to evaluate the vascular anatomy. RADIATION DOSE REDUCTION: This exam was performed according to the departmental dose-optimization program which includes automated exposure control, adjustment of the mA and/or kV  according to patient size and/or use of iterative reconstruction technique. CONTRAST:  75mL OMNIPAQUE  IOHEXOL  350 MG/ML SOLN COMPARISON:  PA and lateral chest from today, chest radiograph 04/06/2020, CTA chest 04/05/2023, and a more recent CTA abdomen and pelvis of 10/24/2022. FINDINGS: Cardiovascular: There is diagnostic pulmonary arterial opacification. No arterial embolus is seen. Pulmonary trunk is upper normal in caliber but unchanged. There are calcific plaques in the aorta and heavily in the coronary arteries. No aortic or great vessel aneurysm, stenosis or dissection. There are CABG changes and a healed sternotomy, chronic. No significant pericardial fluid. No pulmonary venous dilatation. Mediastinum/Nodes: There are increasingly prominent precarinal lymph nodes up to 9 mm in short axis, mildly prominent right hilar lymph nodes up to 1.1 cm, a subcarinal node measuring 1.1 cm also increased. Small hiatal hernia. The esophagus is patulous, distally with increased thickening. Consider endoscopic follow-up. Negative appearance thyroid  gland, thoracic trachea and the axillary spaces. Lungs/Pleura: Small left and minimal layering right pleural effusions. No overt pulmonary edema. There are biapical pleuroparenchymal scarring changes. Calcified granulomas posteriorly in the right lower lobe. There is layering fluid in both main bronchi extending into both lower lobes, with segmental and subsegmental bronchial impactions in both lower lobes in the basal segments. Diffuse bronchial thickening. There is perihilar ground-glass opacity in the right upper lobe base, mid chest level. Peripheral ground-glass opacities noted anteriorly in the left upper lobe. Roughly rectangular left upper lobe nodule laterally is seen measuring 1.0 x 0.6 cm, new. This is probably an inflammatory nodule but will require follow-up. Posterior atelectatic changes are present in both lower lobes without focal consolidation. The lungs are  otherwise clear.  No pneumothorax. Upper Abdomen: Numerous calcified splenic granulomas. There are occasional hepatic calcified granulomas. No acute upper abdominal findings. Abdominal aortic visceral branch vessel atherosclerosis. Musculoskeletal: Osteopenia with advanced degenerative change of the thoracic spine, mild thoracic kyphodextroscoliosis and multilevel interbody thoracic spine ankylosis. No acute or other significant osseous findings. Prior left mastectomy. No chest wall mass is seen. Review of the MIP images confirms the above findings. IMPRESSION: 1. No evidence of arterial embolus. 2. Aortic and coronary artery atherosclerosis. 3. Small left and minimal right pleural effusions with no overt pulmonary edema. 4. Diffuse bronchial thickening with fluid in both main bronchi extending into both lower lobes, with segmental and subsegmental bronchial impactions in both lower lobes. Findings consistent with bronchitis with potential for aspiration component. 5. Perihilar ground-glass opacity in the right upper lobe base, and peripheral ground-glass opacities anteriorly in the left upper lobe. Findings likely due to multifocal pneumonitis. 6. Increased prominence of mediastinal and right hilar lymph nodes, probably reactive. 7. Small hiatal hernia  with increased distal esophageal thickening. Consider endoscopic follow-up. 8. Osteopenia and kyphoscoliosis with advanced degenerative change. Aortic Atherosclerosis (ICD10-I70.0). Electronically Signed   By: Francis Quam M.D.   On: 12/20/2023 23:10   DG Chest 2 View Result Date: 12/20/2023 CLINICAL DATA:  shortness of breath EXAM: CHEST - 2 VIEW COMPARISON:  April 06, 2020 FINDINGS: Chronic coarsening of the pulmonary interstitium without overt pulmonary edema. No focal airspace consolidation or pneumothorax. Trace left pleural effusion. Mild cardiomegaly. Sternotomy wires and CABG markers. Aortic atherosclerosis. no acute fracture or destructive lesions.  Multilevel thoracic osteophytosis. IMPRESSION: Trace left pleural effusion. Electronically Signed   By: Rogelia Myers M.D.   On: 12/20/2023 20:10    Microbiology: Results for orders placed or performed during the hospital encounter of 12/20/23  Resp panel by RT-PCR (RSV, Flu A&B, Covid) Anterior Nasal Swab     Status: None   Collection Time: 12/20/23  7:48 PM   Specimen: Anterior Nasal Swab  Result Value Ref Range Status   SARS Coronavirus 2 by RT PCR NEGATIVE NEGATIVE Final    Comment: (NOTE) SARS-CoV-2 target nucleic acids are NOT DETECTED.  The SARS-CoV-2 RNA is generally detectable in upper respiratory specimens during the acute phase of infection. The lowest concentration of SARS-CoV-2 viral copies this assay can detect is 138 copies/mL. A negative result does not preclude SARS-Cov-2 infection and should not be used as the sole basis for treatment or other patient management decisions. A negative result may occur with  improper specimen collection/handling, submission of specimen other than nasopharyngeal swab, presence of viral mutation(s) within the areas targeted by this assay, and inadequate number of viral copies(<138 copies/mL). A negative result must be combined with clinical observations, patient history, and epidemiological information. The expected result is Negative.  Fact Sheet for Patients:  BloggerCourse.com  Fact Sheet for Healthcare Providers:  SeriousBroker.it  This test is no t yet approved or cleared by the [place]  FDA and  has been authorized for detection and/or diagnosis of SARS-CoV-2 by FDA under an Emergency Use Authorization (EUA). This EUA will remain  in effect (meaning this test can be used) for the duration of the COVID-19 declaration under Section 564(b)(1) of the Act, 21 U.S.C.section 360bbb-3(b)(1), unless the authorization is terminated  or revoked sooner.       Influenza A by PCR  NEGATIVE NEGATIVE Final   Influenza B by PCR NEGATIVE NEGATIVE Final    Comment: (NOTE) The Xpert Xpress SARS-CoV-2/FLU/RSV plus assay is intended as an aid in the diagnosis of influenza from Nasopharyngeal swab specimens and should not be used as a sole basis for treatment. Nasal washings and aspirates are unacceptable for Xpert Xpress SARS-CoV-2/FLU/RSV testing.  Fact Sheet for Patients: BloggerCourse.com  Fact Sheet for Healthcare Providers: SeriousBroker.it  This test is not yet approved or cleared by the [place]  FDA and has been authorized for detection and/or diagnosis of SARS-CoV-2 by FDA under an Emergency Use Authorization (EUA). This EUA will remain in effect (meaning this test can be used) for the duration of the COVID-19 declaration under Section 564(b)(1) of the Act, 21 U.S.C. section 360bbb-3(b)(1), unless the authorization is terminated or revoked.     Resp Syncytial Virus by PCR NEGATIVE NEGATIVE Final    Comment: (NOTE) Fact Sheet for Patients: BloggerCourse.com  Fact Sheet for Healthcare Providers: SeriousBroker.it  This test is not yet approved or cleared by the [place]  FDA and has been authorized for detection and/or diagnosis of SARS-CoV-2 by FDA under an Emergency Use  Authorization (EUA). This EUA will remain in effect (meaning this test can be used) for the duration of the COVID-19 declaration under Section 564(b)(1) of the Act, 21 U.S.C. section 360bbb-3(b)(1), unless the authorization is terminated or revoked.  Performed at [facility], [address], [place], [place] [address]   Blood culture (routine x 2)     Status: None (Preliminary result)   Collection Time: 12/21/23 10:34 AM   Specimen: BLOOD  Result Value Ref Range Status   Specimen Description   Final    BLOOD LEFT ANTECUBITAL Performed at [facility], [address] [place], [place] [address]    Special Requests   Final    BOTTLES DRAWN AEROBIC AND ANAEROBIC Blood Culture adequate volume Performed at [facility], [address], [place], [place] [address]    Culture   Final    NO GROWTH < 24 HOURS Performed at [facility], [address] [place], [place] [address]    Report Status PENDING  Incomplete  Blood culture (routine x 2)     Status: None (Preliminary result)   Collection Time: 12/21/23 10:35 AM   Specimen: BLOOD RIGHT HAND  Result Value Ref Range Status   Specimen Description   Final    BLOOD RIGHT HAND Performed at [facility], [address] [place], [place] [address]    Special Requests   Final    BOTTLES DRAWN AEROBIC AND ANAEROBIC Blood Culture adequate volume Performed at [facility], [address] [place], [place] [address]    Culture   Final    NO GROWTH < 24 HOURS Performed at [facility], [address], [place], [place] [address]    Report Status PENDING  Incomplete    Labs: CBC: Recent Labs  Lab 12/20/23 2018 12/22/23 0356  WBC 5.4 4.7  HGB 11.5* 10.7*  HCT 35.2* 33.4*  MCV 100.9* 103.4*  PLT 254 251   Basic Metabolic Panel: Recent Labs  Lab 12/20/23 2018 12/21/23 1056 12/22/23 0356  NA 135  --  136  K 4.4  --  3.7  CL 96*  --  102  CO2 29  --  25  GLUCOSE 147*  --  109*  BUN 14  --  13  CREATININE 0.76  --  0.67  CALCIUM  9.8  --  8.2*  MG  --  2.3  --   PHOS  --  4.3  --    Liver Function Tests: No results for input(s): AST, ALT, ALKPHOS, BILITOT, PROT, ALBUMIN in the last 168 hours. CBG: Recent Labs  Lab 12/21/23 1405 12/21/23 1616 12/21/23 2159 12/22/23 0338 12/22/23 0727  GLUCAP 104* 137* 192* 135* 134*    Discharge time spent: greater than 30 minutes.  Signed: Adriana DELENA Grams, MD [facility] 12/22/2023

## 2023-12-22 NOTE — Evaluation (Signed)
 Occupational Therapy Evaluation Patient Details Name: Natasha Hernandez MRN: [id number] DOB: 02/08/1941 Today's Date: 12/22/2023   History of Present Illness   Natasha Hernandez is a 83 year old female with history of CAD status post CABG, DM  , HTN,HLD, PAD presenting to [facility] with cough and shortness of breath. She was found Hypoxic 85% on room air and placed on 2 L [place]. No fever or leukocytosis. . (per MD)     Clinical Impressions Pt agreeable to OT evaluation. Pt required min A for sit to stand and step pivot to the chair without AD due to balance deficits. Pt then able to ambulate to the toilet with RW followed by ambulation without RW back tot he chair with CGA and mild touching of walls for stability. Pt able to dress lower body with set up to min A today. B UE WFL today. Family reports no need for in home OT therapy and reports pt has had no falls in the past 6 months. Pt left in the chair with call bell within reach and family present. Pt will benefit from continued OT in the hospital and recommended venue below to increase strength, balance, and endurance for safe ADL's.        If plan is discharge home, recommend the following:   A little help with walking and/or transfers;A little help with bathing/dressing/bathroom;Assist for transportation;Help with stairs or ramp for entrance;Assistance with cooking/housework     Functional Status Assessment   Patient has had a recent decline in their functional status and demonstrates the ability to make significant improvements in function in a reasonable and predictable amount of time.     Equipment Recommendations   None recommended by OT             Precautions/Restrictions   Precautions Precautions: Fall Recall of Precautions/Restrictions: Intact Restrictions Weight Bearing Restrictions Per Provider Order: No     Mobility Bed Mobility Overal bed mobility: Needs Assistance Bed Mobility: Supine to Sit      Supine to sit: Supervision     General bed mobility comments: mild labored effort    Transfers Overall transfer level: Needs assistance   Transfers: Sit to/from Stand, Bed to chair/wheelchair/BSC Sit to Stand: Contact guard assist, Min assist     Step pivot transfers: Contact guard assist, Min assist     General transfer comment: EOB to chair without RW; posterior sway/loss of balance with some correction needed.      Balance Overall balance assessment: Needs assistance Sitting-balance support: Feet supported, No upper extremity supported Sitting balance-Leahy Scale: Fair Sitting balance - Comments: seated at EOB   Standing balance support: No upper extremity supported, During functional activity, Bilateral upper extremity supported Standing balance-Leahy Scale: Fair Standing balance comment: poor to fair without RW; fair with RW                           ADL either performed or assessed with clinical judgement   ADL Overall ADL's : Needs assistance/impaired     Grooming: Contact guard assist;Standing   Upper Body Bathing: Set up;Sitting   Lower Body Bathing: Set up;Minimal assistance;Sitting/lateral leans   Upper Body Dressing : Set up;Sitting   Lower Body Dressing: Set up;Minimal assistance;Sitting/lateral leans Lower Body Dressing Details (indicate cue type and reason): Pt able to don shoes with extended time; mild assist just to tighten one velcro strap on the shoe. Toilet Transfer: Minimal assistance;Stand-pivot;Contact guard assist;Rollator (4 wheels);Ambulation Toilet Transfer  Details (indicate cue type and reason): EOB to chair without AD required min A due to mild posterior sway; Pt able to ambulate to the bathroom and sit on the [facility] with CGA followed by ambulating back to the chair without RW and CGA with pt touching walls for balance.         Functional mobility during ADLs: Contact guard assist;Rolling walker (2 wheels)       Vision  Baseline Vision/History: 1 Wears glasses Ability to See in Adequate Light: 0 Adequate Patient Visual Report: No change from baseline Vision Assessment?: No apparent visual deficits     Perception Perception: Not tested       Praxis Praxis: Not tested       Pertinent Vitals/Pain Pain Assessment Pain Assessment: No/denies pain     Extremity/Trunk Assessment Upper Extremity Assessment Upper Extremity Assessment: Overall WFL for tasks assessed   Lower Extremity Assessment Lower Extremity Assessment: Defer to PT evaluation   Cervical / Trunk Assessment Cervical / Trunk Assessment: Kyphotic   Communication Communication Communication: No apparent difficulties   Cognition Arousal: Alert Behavior During Therapy: WFL for tasks assessed/performed Cognition: No apparent impairments                               Following commands: Intact       Cueing  General Comments   Cueing Techniques: Verbal cues;Tactile cues      Exercises     Shoulder Instructions      Home Living Family/patient expects to be discharged to:: Private residence Living Arrangements: Children Available Help at Discharge: Available 24 hours/day Type of Home: House Home Access: Stairs to enter [employer] of Steps: 2 Entrance Stairs-Rails: None Home Layout: One level     Bathroom Shower/Tub: Producer, television/film/video: Standard Bathroom Accessibility: Yes   Home Equipment: Cane - single point;Grab bars - tub/shower;Rolling Walker (2 wheels);Wheelchair - manual   Additional Comments: per chart and pt/family confirmation      Prior Functioning/Environment Prior Level of Function : Needs assist       Physical Assist : ADLs (physical)   ADLs (physical): IADLs Mobility Comments: Pt reports ambulating with a walking stick outside. ADLs Comments: Independent ADL's; assisted IADL's.    OT Problem List: Impaired balance (sitting and/or standing)   OT  Treatment/Interventions: Self-care/ADL training;Therapeutic exercise;Therapeutic activities;Balance training;Patient/family education      OT Goals(Current goals can be found in the care plan section)   Acute Rehab OT Goals Patient Stated Goal: return home OT Goal Formulation: With patient Time For Goal Achievement: 01/05/24 Potential to Achieve Goals: Good   OT Frequency:  Min 1X/week    Co-evaluation                               End of Session Equipment Utilized During Treatment: Rolling walker (2 wheels);Gait belt  Activity Tolerance: Patient tolerated treatment well Patient left: in chair;with call bell/phone within reach;with family/visitor present  OT Visit Diagnosis: Unsteadiness on feet (R26.81);Other abnormalities of gait and mobility (R26.89);Muscle weakness (generalized) (M62.81)                Time: 9153-9092 OT Time Calculation (min): 21 min Charges:  OT General Charges $OT Visit: 1 Visit OT Evaluation $OT Eval Low Complexity: 1 Low  Estee Yohe OT, MOT  Jayson Person 12/22/2023, 10:28 AM

## 2023-12-22 NOTE — Plan of Care (Signed)
   Problem: Education: Goal: Knowledge of General Education information will improve Description Including pain rating scale, medication(s)/side effects and non-pharmacologic comfort measures Outcome: Progressing   Problem: Health Behavior/Discharge Planning: Goal: Ability to manage health-related needs will improve Outcome: Progressing

## 2023-12-22 NOTE — Plan of Care (Signed)
  Problem: Acute Rehab OT Goals (only OT should resolve) Goal: Pt. Will Perform Grooming Flowsheets (Taken 12/22/2023 1031) Pt Will Perform Grooming: with modified independence Goal: Pt. Will Perform Lower Body Bathing Flowsheets (Taken 12/22/2023 1031) Pt Will Perform Lower Body Bathing: with modified independence Goal: Pt. Will Perform Lower Body Dressing Flowsheets (Taken 12/22/2023 1031) Pt Will Perform Lower Body Dressing: with modified independence Goal: Pt. Will Transfer To Toilet Flowsheets (Taken 12/22/2023 1031) Pt Will Transfer to Toilet:  with modified independence  ambulating  Natasha Hernandez OT, MOT

## 2023-12-22 NOTE — Care Management Important Message (Signed)
 Important Message  Patient Details  Name: Natasha Hernandez MRN: [id number] Date of Birth: 11/03/1940   Important Message Given:  N/A - LOS <3 / Initial given by admissions     [name] 12/22/2023, 10:43 AM

## 2023-12-22 NOTE — TOC Transition Note (Signed)
 Transition of Care [facility]) - Discharge Note   Patient Details  Name: Natasha Hernandez MRN: [id number] Date of Birth: 1940/06/28  [facility] CM/SW Contact:  Sharlyne Stabs, RN Phone Number: 12/22/2023, 11:23 AM   Clinical Narrative:   Patient discharging home. TOC at the bedside to review PT eval. Patient seems confused, she wanted Brandi to make the decision. CM called Brandi she is on her way to transport the patient home. States patient just finished 8 weeks for home health. She has been doing well and not needing any DME to ambulate. They do have a walking stick to use until she recovers. She will hold off on more HHPT for now.    Final next level of care: Home/Self Care Barriers to Discharge: Barriers Resolved   Patient Goals and CMS Choice Patient states their goals for this hospitalization and ongoing recovery are:: return home. CMS Medicare.gov Compare Post Acute Care list provided to:: Patient Represenative (must comment) Choice offered to / list presented to : Adult Children [place] ownership interest in [facility].provided to:: Adult Children    Discharge Placement       Name of family member notified: Brandi Patient and family notified of of transfer: 12/22/23  Discharge Plan and Services Additional resources added to the After Visit Summary for        Social Drivers of Health (SDOH) Interventions SDOH Screenings   Food Insecurity: No Food Insecurity (12/21/2023)  Housing: Low Risk  (12/21/2023)  Transportation Needs: No Transportation Needs (12/21/2023)  Utilities: Not At Risk (12/21/2023)  Depression (PHQ2-9): Low Risk  (07/05/2023)  Financial Resource Strain: Low Risk  (10/10/2023)  Physical Activity: Insufficiently Active (10/10/2023)  Social Connections: Moderately Integrated (12/21/2023)  Stress: No Stress Concern Present (10/10/2023)  Tobacco Use: Low Risk  (12/20/2023)

## 2023-12-23 ENCOUNTER — Telehealth: Payer: Self-pay

## 2023-12-23 ENCOUNTER — Ambulatory Visit

## 2023-12-23 DIAGNOSIS — Z0000 Encounter for general adult medical examination without abnormal findings: Secondary | ICD-10-CM

## 2023-12-23 NOTE — Transitions of Care (Post Inpatient/ED Visit) (Signed)
 12/23/2023  Name: Natasha Hernandez MRN: [id number] DOB: [birth year]  Today's TOC FU Call Status: Today's TOC FU Call Status:: Successful TOC FU Call Completed TOC FU Call Complete Date: 12/23/23 Patient's Name and Date of Birth confirmed.  Transition Care Management Follow-up Telephone Call Date of Discharge: 12/22/23 [facility] Type of Discharge: Inpatient Admission Primary Inpatient Discharge Diagnosis:: Acute respiratory Failure with hypoxia/ CAP How have you been since you were released from the hospital?: Same Any questions or concerns?: No  Items Reviewed: Did you receive and understand the discharge instructions provided?: Yes Medications obtained,verified, and reconciled?: Yes (Medications Reviewed) Any new allergies since your discharge?: No Dietary orders reviewed?: Yes Type of Diet Ordered:: low salt heart healthy Do you have support at home?: Yes People in Home [RPT]: child(ren), adult Name of Support/Comfort Primary Source: Damyiah Moxley ( daughter in law).  Full assessment completed with daughter in law Daphne Pa  Medications Reviewed Today: Medications Reviewed Today     Reviewed by Briggett Tuccillo E, RN (Registered Nurse) on 12/23/23 at 1203  Med List Status: <None>   Medication Order Taking? Sig Documenting Provider Last Dose Status Informant  ADMELOG  100 UNIT/ML injection [id number] Yes INJECT 40 UNITS INTO THE SKIN DAILY. VIA PUMP Thapa, [place], MD  Active Child           Med Note LEONARDO, KIMBERLY L   Wed Dec 21, 2023 10:47 AM) Units varies  aspirin  EC 81 MG tablet [id number] Yes Take 1 tablet (81 mg total) by mouth daily. Darron Deatrice LABOR, MD  Active Child  Calcium -Magnesium-Vitamin D (CALCIUM  MAGNESIUM PO) [id number] Yes Take 1 tablet by mouth daily. [provider]  Active Child  carvedilol  (COREG ) 6.25 MG tablet [id number] Yes Take 1 tablet (6.25 mg total) by mouth 2 (two) times daily with a meal. Darliss Rogue, MD   Active Child  Coenzyme Q10 (CO Q 10 PO) [id number] Yes Take 1 capsule by mouth daily. [provider]  Active Child  Continuous Blood Gluc Sensor (DEXCOM G6 SENSOR) MISC [id number] Yes 1 each by Does not apply route See admin instructions. Use one sensor once every 10 days to monitor blood sugars. Von [place], MD  Active Child  Continuous Blood Gluc Transmit (DEXCOM G6 TRANSMITTER) MISC [id number] Yes 1 each by Does not apply route See admin instructions. Use one transmitter once every 90 days to transmit blood sugars. [provider]  Active Child  dextromethorphan-guaiFENesin [facility] DM) 30-600 MG 12hr tablet [id number] Yes Take 1 tablet by mouth 2 (two) times daily. [provider]  Active Child  ezetimibe  (ZETIA ) 10 MG tablet [id number] Yes Take 1 tablet by mouth daily. Thapa, [place], MD  Active Child  Glucagon  (GVOKE HYPOPEN  1-PACK) 1 MG/0.2ML SOAJ [id number]  Inject 1 mg into the skin as needed (low blood sugar with impaired consciousness).  Patient not taking: Reported on 12/23/2023   Thapa, [place], MD  Active Child           Med Note LEONARDO, [facility]   Wed Dec 21, 2023 10:48 AM) Do not have  glucose blood test strip [id number] Yes Use Contour Next test strips as instructed to check blood sugar 4 times daily. 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 [provider]  Active Child  isosorbide  mononitrate (IMDUR ) 30 MG 24 hr tablet [id number] Yes Take 1 tablet by mouth twice daily. Darron Deatrice LABOR, MD  Active Child  lactobacillus acidophilus & bulgar (LACTINEX) chewable tablet [id number] Yes Chew 1 tablet by mouth 3 (three) times daily with meals for 5 days. Willette Adriana LABOR, MD  Active   levofloxacin  (LEVAQUIN ) 750 MG tablet [id number] Yes Take 1  tablet (750 mg total) by mouth daily for 5 days. Willette Adriana LABOR, MD  Active   lisinopril  (ZESTRIL ) 10 MG tablet [id number] Yes Take 1 tablet by mouth daily. Thapa, [place], MD  Active Child  Multiple Vitamin (MULTIVITAMIN) tablet [id number] Yes Take 1 tablet by mouth daily. [provider]  Active Child  rosuvastatin  (CRESTOR ) 40 MG tablet [id number] Yes Take 1 tablet by mouth daily. Thapa, [place], MD  Active Child            Home Care and Equipment/Supplies: Were Home Health Services Ordered?: No Any new equipment or medical supplies ordered?: No  Functional Questionnaire: Do you need assistance with bathing/showering or dressing?: Yes Do you need assistance with meal preparation?: Yes Do you need assistance with eating?: No Do you have difficulty maintaining continence: No Do you need assistance with getting out of bed/getting out of a chair/moving?: No Do you have difficulty managing or taking your medications?: Yes (daughter in law manages)  Follow up appointments reviewed: PCP Follow-up appointment confirmed?: Yes Date of PCP follow-up appointment?: 12/30/23 Follow-up Provider: roselie Mood, np [facility] Follow-up appointment confirmed?: NA Do you need transportation to your follow-up appointment?: No Do you understand care options if your condition(s) worsen?: Yes-patient verbalized understanding  SDOH Interventions Today    Flowsheet Row Most Recent Value  SDOH Interventions   Food Insecurity Interventions Intervention Not Indicated  Housing Interventions Intervention Not Indicated  Transportation Interventions Intervention Not Indicated  Utilities Interventions Intervention Not Indicated   Discussed and offered 30 day TOC program.  Daughter in law declined services.  The patient has been provided with contact information for the care management team and has been advised to call with any health -related questions or concerns.  The patient verbalized  understanding with current plan of care.  The patient is directed to their insurance card regarding availability of benefits coverage.    Arvin Seip RN, BSN, CCM [employer], Population Health Case Manager Phone: [phone]

## 2023-12-23 NOTE — Patient Instructions (Signed)
 Natasha Hernandez,  Thank you for taking the time for your Medicare Wellness Visit. I appreciate your continued commitment to your health goals. Please review the care plan we discussed, and feel free to reach out if I can assist you further.  Medicare recommends these wellness visits once per year to help you and your care team stay ahead of potential health issues. These visits are designed to focus on prevention, allowing your provider to concentrate on managing your acute and chronic conditions during your regular appointments.  Please note that Annual Wellness Visits do not include a physical exam. Some assessments may be limited, especially if the visit was conducted virtually. If needed, we may recommend a separate in-person follow-up with your provider.  Ongoing Care Seeing your primary care provider every 3 to 6 months helps us  monitor your health and provide consistent, personalized care.   Referrals If a referral was made during today's visit and you haven't received any updates within two weeks, please contact the referred provider directly to check on the status.  Recommended Screenings:  Health Maintenance  Topic Date Due   Zoster (Shingles) Vaccine (1 of 2) Never done   Complete foot exam   11/12/2023   Flu Shot  11/18/2023   COVID-19 Vaccine (1 - 2024-25 season) Never done   DTaP/Tdap/Td vaccine (1 - Tdap) 07/04/2024*   Pneumococcal Vaccine for age over 75 (2 of 2 - PPSV23, PCV20, or PCV21) 07/04/2024*   DEXA scan (bone density measurement)  07/04/2024*   Eye exam for diabetics  02/28/2024   Hemoglobin A1C  04/13/2024   Yearly kidney health urinalysis for diabetes  10/12/2024   Yearly kidney function blood test for diabetes  12/21/2024   Medicare Annual Wellness Visit  12/22/2024   HPV Vaccine  Aged Out   Meningitis B Vaccine  Aged Out  *Topic was postponed. The date shown is not the original due date.       12/23/2023   11:34 AM  Advanced Directives  Does Patient Have  a Medical Advance Directive? Yes  Type of Estate agent of [place];Living will  Copy of Healthcare Power of Attorney in Chart? Yes - validated most recent copy scanned in chart (See row information)   Advance Care Planning is important because it: Ensures you receive medical care that aligns with your values, goals, and preferences. Provides guidance to your family and loved ones, reducing the emotional burden of decision-making during critical moments.  Vision: Annual vision screenings are recommended for early detection of glaucoma, cataracts, and diabetic retinopathy. These exams can also reveal signs of chronic conditions such as diabetes and high blood pressure.  Dental: Annual dental screenings help detect early signs of oral cancer, gum disease, and other conditions linked to overall health, including heart disease and diabetes.  Please see the attached documents for additional preventive care recommendations.

## 2023-12-23 NOTE — Patient Instructions (Signed)
 Visit Information  Thank you for taking time to visit with me today. Please don't hesitate to contact me if I can be of assistance to you   Patient instructions:  Activity as tolerated - No restrictions Call MD for: difficulty breathing, headache or visual disturbances Call MD for: extreme fatigue Call MD for: hives Call MD for: persistant dizziness or light-headedness Call MD for: persistant nausea and vomiting Call MD for: redness, tenderness, or signs of infection (pain, swelling, redness, odor or Salle Brandle/yellow discharge around incision site) Call MD for: severe uncontrolled pain Call MD for: temperature >100.4 Take antibiotic until completed Keep follow up appointment with provider Take medications as prescribed. Call provider for any new/ ongoing symptoms Seek emergency medical treatment for severe symptoms:  shortness of breath/ chest pain   Patient verbalizes understanding of instructions and care plan provided today and agrees to view in MyChart. Active MyChart status and patient understanding of how to access instructions and care plan via MyChart confirmed with patient.     The patient has been provided with contact information for the care management team and has been advised to call with any health related questions or concerns.   Please call the care guide team at [phone] if you need to cancel or reschedule your appointment.   Please call the Suicide and Crisis Lifeline: 988 call the [place]  National Suicide Prevention Lifeline: 417-366-5548 or TTY: (260) 807-6209 TTY 320 275 4353) to talk to a trained counselor call 1-800-273-TALK (toll free, 24 hour hotline) go to [facility] [address] [place] [phone]) if you are experiencing a Mental Health or Behavioral Health Crisis or need someone to talk to.  Arvin Seip RN, BSN, CCM [employer], Population Health Case Manager Phone:  [phone]

## 2023-12-23 NOTE — Progress Notes (Signed)
 Subjective:   Natasha Hernandez is a 83 y.o. who presents for a Medicare Wellness preventive visit.  As a reminder, Annual Wellness Visits don't include a physical exam, and some assessments may be limited, especially if this visit is performed virtually. We may recommend an in-person follow-up visit with your provider if needed.  Visit Complete: Virtual I connected with  Idaly Theoplis Pa on 12/23/23 by a audio enabled telemedicine application and verified that I am speaking with the correct person using two identifiers.  Patient Location: Home  Provider Location: Office/Clinic  I discussed the limitations of evaluation and management by telemedicine. The patient expressed understanding and agreed to proceed.  Vital Signs: Because this visit was a virtual/telehealth visit, some criteria may be missing or patient reported. Any vitals not documented were not able to be obtained and vitals that have been documented are patient reported.  VideoError- Librarian, academic were attempted between this provider and patient, however failed, due to patient having technical difficulties OR patient did not have access to video capability.  We continued and completed visit with audio only.   Persons Participating in Visit: Patient assisted by daughter.  AWV Questionnaire: Yes: Patient Medicare AWV questionnaire was completed by the patient on 12/23/2023; I have confirmed that all information answered by patient is correct and no changes since this date.  Cardiac Risk Factors include: advanced age (>58men, >26 women);diabetes mellitus;dyslipidemia;hypertension     Objective:    Today's Vitals   There is no height or weight on file to calculate BMI.     12/23/2023   11:34 AM 12/20/2023    7:47 PM 10/24/2022   12:08 PM 04/05/2019    7:00 PM 03/26/2019    9:34 PM 04/16/2018   12:14 PM  Advanced Directives  Does Patient Have a Medical Advance Directive? Yes No No Yes Yes  Yes   Type of Estate agent of [place];Living will   Living will Healthcare Power of [place];Living will Healthcare Power of Attorney  Does patient want to make changes to medical advance directive?    No - Patient declined No - Patient declined No - Patient declined   Copy of Healthcare Power of Attorney in Chart? Yes - validated most recent copy scanned in chart (See row information)   Yes - validated most recent copy scanned in chart (See row information) No - copy requested No - copy requested   Would patient like information on creating a medical advance directive?  No - Patient declined         Data saved with a previous flowsheet row definition    Current Medications (verified) Outpatient Encounter Medications as of 12/23/2023  Medication Sig   ADMELOG  100 UNIT/ML injection INJECT 40 UNITS INTO THE SKIN DAILY. VIA PUMP   aspirin  EC 81 MG tablet Take 1 tablet (81 mg total) by mouth daily.   Calcium -Magnesium-Vitamin D (CALCIUM  MAGNESIUM PO) Take 1 tablet by mouth daily.   carvedilol  (COREG ) 6.25 MG tablet Take 1 tablet (6.25 mg total) by mouth 2 (two) times daily with a meal.   Coenzyme Q10 (CO Q 10 PO) Take 1 capsule by mouth daily.   Continuous Blood Gluc Sensor (DEXCOM G6 SENSOR) MISC 1 each by Does not apply route See admin instructions. Use one sensor once every 10 days to monitor blood sugars.   Continuous Blood Gluc Transmit (DEXCOM G6 TRANSMITTER) MISC 1 each by Does not apply route See admin instructions. Use one transmitter once  every 90 days to transmit blood sugars.   dextromethorphan-guaiFENesin (MUCINEX DM) 30-600 MG 12hr tablet Take 1 tablet by mouth 2 (two) times daily.   ezetimibe  (ZETIA ) 10 MG tablet Take 1 tablet by mouth daily.   Glucagon  (GVOKE HYPOPEN  1-PACK) 1 MG/0.2ML SOAJ Inject 1 mg into the skin as needed (low blood sugar with impaired consciousness).   glucose blood test strip Use Contour Next test strips as instructed to check blood sugar  4 times daily. 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   isosorbide  mononitrate (IMDUR ) 30 MG 24 hr tablet Take 1 tablet by mouth twice daily.   lactobacillus acidophilus & bulgar (LACTINEX) chewable tablet Chew 1 tablet by mouth 3 (three) times daily with meals for 5 days.   levofloxacin  (LEVAQUIN ) 750 MG tablet Take 1 tablet (750 mg total) by mouth daily for 5 days.   lisinopril  (ZESTRIL ) 10 MG tablet Take 1 tablet by mouth daily.   Multiple Vitamin (MULTIVITAMIN) tablet Take 1 tablet by mouth daily.   rosuvastatin  (CRESTOR ) 40 MG tablet Take 1 tablet by mouth daily.   No facility-administered encounter medications on file as of 12/23/2023.    Allergies (verified) Patient has no known allergies.   History: Past Medical History:  Diagnosis Date   AKI (acute kidney injury) (HCC) 04/16/2018   Arthritis    Carotid artery occlusion    Cataract    Had cataract surgery sometime before 2017   Coronary artery disease    CABG in 1996. Most recent cardiac catheterization in 2013 showed patent grafts including LIMA to LAD, SVG to OM, SVG to diagonal and SVG to PDA.   Diabetes mellitus without complication (HCC)    GERD (gastroesophageal reflux disease)    Hyperlipidemia    Hypertension    Neuromuscular disorder (HCC)    Osteoporosis    PAD (peripheral artery disease) (HCC)    Pain in finger of right hand 04/13/2016   Pneumonia due to COVID-19 virus 03/27/2019   Past Surgical History:  Procedure Laterality Date   BREAST SURGERY     Mastectomy before 1995   CARDIAC CATHETERIZATION     CORONARY ARTERY BYPASS GRAFT  1996   EYE SURGERY     FRACTURE SURGERY  03/2016   Broke wrist   Family History  Problem Relation Age of Onset   Breast cancer Mother 58   Stroke Mother    Emphysema Father 9   Heart attack Father    Diabetes Paternal Grandfather     Social History   Socioeconomic History   Marital status: Widowed    Spouse name: Not on file   Number of children: Not on file   Years of education: Not on file   Highest education level: Bachelor's degree (e.g., BA, AB, BS)  Occupational History   Occupation: Retired  Tobacco Use   Smoking status: Never   Smokeless tobacco: Never  Vaping Use   Vaping status: Never Used  Substance and Sexual Activity   Alcohol use: No   Drug use: Never   Sexual activity: Not on file  Other Topics Concern   Not on file  Social History Narrative   Pt lives w/ son, dtr-in-law and grandchildren.    Caffiene 2 cups daily      Social Drivers of Health   Financial Resource Strain: Low Risk  (12/23/2023)  Overall Financial Resource Strain (CARDIA)    Difficulty of Paying Living Expenses: Not hard at all  Food Insecurity: No Food Insecurity (12/23/2023)   Hunger Vital Sign    Worried About Running Out of Food in the Last Year: Never true    Ran Out of Food in the Last Year: Never true  Transportation Needs: No Transportation Needs (12/23/2023)   PRAPARE - Administrator, Civil Service (Medical): No    Lack of Transportation (Non-Medical): No  Physical Activity: Insufficiently Active (12/23/2023)   Exercise Vital Sign    Days of Exercise per Week: 7 days    Minutes of Exercise per Session: 10 min  Stress: No Stress Concern Present (12/23/2023)   [employer] of Occupational Health - Occupational Stress Questionnaire    Feeling of Stress: Not at all  Social Connections: Moderately Isolated (12/23/2023)   Social Connection and Isolation Panel    Frequency of Communication with Friends and Family: Twice a week    Frequency of Social Gatherings with Friends and Family: Twice a week    Attends Religious Services: More than 4 times per year    Active Member of [employer] or Organizations: No    Attends Banker Meetings: Never    Marital Status: Widowed    Tobacco  Counseling Counseling given: Not Answered    Clinical Intake:  Pre-visit preparation completed: Yes  Pain : No/denies pain     Nutritional Risks: None Diabetes: Yes CBG done?: No Did pt. bring in CBG monitor from home?: No  Lab Results  Component Value Date   HGBA1C 8.0 (A) 10/13/2023   HGBA1C 8.5 (A) 07/18/2023   HGBA1C 9.8 (A) 04/18/2023     How often do you need to have someone help you when you read instructions, pamphlets, or other written materials from your doctor or pharmacy?: 1 - Never  Interpreter Needed?: No  Information entered by :: NAllen LPN   Activities of Daily Living     12/23/2023    9:36 AM 12/21/2023    6:48 PM  In your present state of health, do you have any difficulty performing the following activities:  Hearing? 1 1  Comment has hearing aids   Vision? 0 0  Difficulty concentrating or making decisions? 1 1  Walking or climbing stairs? 1   Dressing or bathing? 0   Doing errands, shopping? 1   Comment has someone with her   Preparing Food and eating ? N   Using the Toilet? N   In the past six months, have you accidently leaked urine? N   Do you have problems with loss of bowel control? N   Managing your Medications? Y   Managing your Finances? Y   Housekeeping or managing your Housekeeping? N     Patient Care Team: Nche, Roselie Rockford, NP as PCP - General (Internal Medicine) Darron Deatrice LABOR, MD as PCP - Cardiology (Cardiology) Octavia Bruckner, MD as Consulting Physician (Ophthalmology) Elner Arley LABOR, MD as Consulting Physician (Ophthalmology)  I have updated your Care Teams any recent Medical Services you may have received from other providers in the past year.     Assessment:   This is a routine wellness examination for Natasha Hernandez.  Hearing/Vision screen Hearing Screening - Comments:: Has hearing aids that are maintained Vision Screening - Comments:: Regular eye exams, [facility]   Goals Addressed             This  Visit's Progress  Patient Stated       12/23/2023, wants to feel better       Depression Screen     12/23/2023   11:36 AM 07/05/2023   10:09 AM 08/02/2019    4:03 PM 09/27/2016    9:55 AM  PHQ 2/9 Scores  PHQ - 2 Score 0 0 0 0  PHQ- 9 Score 7 1 0     Fall Risk     12/23/2023    9:36 AM 07/05/2023   10:08 AM 08/02/2019    4:03 PM 03/03/2018    3:50 PM 09/27/2016    9:55 AM  Fall Risk   Falls in the past year? 0 0 0  0  Yes   Comment    Emmi Telephone Survey: data to providers prior to load    Number falls in past yr: 0 0 0  1   Injury with Fall? 0 0 0  Yes   Comment     broke right wrist   Risk for fall due to : Medication side effect No Fall Risks Other (Comment)    Follow up Falls prevention discussed;Falls evaluation completed Falls evaluation completed;Falls prevention discussed Falls prevention discussed;Falls evaluation completed        Data saved with a previous flowsheet row definition    MEDICARE RISK AT HOME:  Medicare Risk at Home Any stairs in or around the home?: (Patient-Rptd) Yes If so, are there any without handrails?: (Patient-Rptd) No Home free of loose throw rugs in walkways, pet beds, electrical cords, etc?: (Patient-Rptd) Yes Adequate lighting in your home to reduce risk of falls?: (Patient-Rptd) Yes Life alert?: (Patient-Rptd) No Use of a cane, walker or w/c?: (Patient-Rptd) Yes Grab bars in the bathroom?: (Patient-Rptd) Yes Shower chair or bench in shower?: (Patient-Rptd) Yes Elevated toilet seat or a handicapped toilet?: (Patient-Rptd) Yes  TIMED UP AND GO:  Was the test performed?  No  Cognitive Function: Impaired: Patient has current diagnosis of cognitive impairment.      11/24/2023    2:22 PM 10/31/2023   11:37 AM 07/05/2023    2:18 PM  MMSE - Mini Mental State Exam  Orientation to time 0 2 1  Orientation to Place 1 3 3   Registration 3 2 3   Attention/ Calculation 0 2 5  Recall 0 0 2  Language- name 2 objects 2 2 2   Language- repeat 1 1  1   Language- follow 3 step command 2 3 3   Language- read & follow direction 1 1 1   Write a sentence 1 1 1   Copy design 0 0 0  Total score 11 17 22         Immunizations Immunization History  Administered Date(s) Administered   Fluad Quad(high Dose 65+) 05/18/2019   INFLUENZA, HIGH DOSE SEASONAL PF 03/20/2018, 02/22/2020   Pneumococcal Conjugate-13 05/07/2016   Pneumococcal-Unspecified 04/19/2016    Screening Tests Health Maintenance  Topic Date Due   Zoster Vaccines- Shingrix (1 of 2) Never done   FOOT EXAM  11/12/2023   Influenza Vaccine  11/18/2023   COVID-19 Vaccine (1 - 2024-25 season) Never done   DTaP/Tdap/Td (1 - Tdap) 07/04/2024 (Originally 03/21/1960)   Pneumococcal Vaccine: 50+ Years (2 of 2 - PPSV23, PCV20, or PCV21) 07/04/2024 (Originally 07/02/2016)   DEXA SCAN  07/04/2024 (Originally 03/22/2007)   OPHTHALMOLOGY EXAM  02/28/2024   HEMOGLOBIN A1C  04/13/2024   Diabetic kidney evaluation - Urine ACR  10/12/2024   Diabetic kidney evaluation - eGFR measurement  12/21/2024  Medicare Annual Wellness (AWV)  12/22/2024   HPV VACCINES  Aged Out   Meningococcal B Vaccine  Aged Out    Health Maintenance  Health Maintenance Due  Topic Date Due   Zoster Vaccines- Shingrix (1 of 2) Never done   FOOT EXAM  11/12/2023   Influenza Vaccine  11/18/2023   COVID-19 Vaccine (1 - 2024-25 season) Never done   Health Maintenance Items Addressed: Declines vaccines  Additional Screening:  Vision Screening: Recommended annual ophthalmology exams for early detection of glaucoma and other disorders of the eye. Would you like a referral to an eye doctor? No    Dental Screening: Recommended annual dental exams for proper oral hygiene  Community Resource Referral / Chronic Care Management: CRR required this visit?  No   CCM required this visit?  No   Plan:    I have personally reviewed and noted the following in the patient's chart:   Medical and social history Use of  alcohol, tobacco or illicit drugs  Current medications and supplements including opioid prescriptions. Patient is not currently taking opioid prescriptions. Functional ability and status Nutritional status Physical activity Advanced directives List of other physicians Hospitalizations, surgeries, and ER visits in previous 12 months Vitals Screenings to include cognitive, depression, and falls Referrals and appointments  In addition, I have reviewed and discussed with patient certain preventive protocols, quality metrics, and best practice recommendations. A written personalized care plan for preventive services as well as general preventive health recommendations were provided to patient.   Ardella FORBES Dawn, LPN   [id number]   After Visit Summary: (MyChart) Due to this being a telephonic visit, the after visit summary with patients personalized plan was offered to patient via MyChart   Notes: Nothing significant to report at this time.

## 2023-12-26 LAB — CULTURE, BLOOD (ROUTINE X 2)
Culture: NO GROWTH
Culture: NO GROWTH
Special Requests: ADEQUATE
Special Requests: ADEQUATE

## 2023-12-27 ENCOUNTER — Emergency Department (HOSPITAL_COMMUNITY)

## 2023-12-27 ENCOUNTER — Ambulatory Visit: Payer: Self-pay

## 2023-12-27 ENCOUNTER — Encounter (HOSPITAL_COMMUNITY): Payer: Self-pay | Admitting: Emergency Medicine

## 2023-12-27 ENCOUNTER — Emergency Department (HOSPITAL_COMMUNITY)
Admission: EM | Admit: 2023-12-27 | Discharge: 2023-12-27 | Disposition: A | Discharge status: Home / Self Care | Attending: Emergency Medicine | Admitting: Emergency Medicine

## 2023-12-27 ENCOUNTER — Other Ambulatory Visit: Payer: Self-pay

## 2023-12-27 DIAGNOSIS — Z951 Presence of aortocoronary bypass graft: Secondary | ICD-10-CM | POA: Insufficient documentation

## 2023-12-27 DIAGNOSIS — E1040 Type 1 diabetes mellitus with diabetic neuropathy, unspecified: Secondary | ICD-10-CM | POA: Insufficient documentation

## 2023-12-27 DIAGNOSIS — R059 Cough, unspecified: Secondary | ICD-10-CM | POA: Insufficient documentation

## 2023-12-27 DIAGNOSIS — E871 Hypo-osmolality and hyponatremia: Secondary | ICD-10-CM | POA: Diagnosis not present

## 2023-12-27 DIAGNOSIS — Z7982 Long term (current) use of aspirin: Secondary | ICD-10-CM | POA: Insufficient documentation

## 2023-12-27 DIAGNOSIS — E860 Dehydration: Secondary | ICD-10-CM | POA: Diagnosis not present

## 2023-12-27 DIAGNOSIS — J984 Other disorders of lung: Secondary | ICD-10-CM | POA: Diagnosis not present

## 2023-12-27 DIAGNOSIS — Z794 Long term (current) use of insulin: Secondary | ICD-10-CM | POA: Diagnosis not present

## 2023-12-27 DIAGNOSIS — J90 Pleural effusion, not elsewhere classified: Secondary | ICD-10-CM | POA: Diagnosis not present

## 2023-12-27 DIAGNOSIS — I10 Essential (primary) hypertension: Secondary | ICD-10-CM | POA: Diagnosis not present

## 2023-12-27 DIAGNOSIS — I2510 Atherosclerotic heart disease of native coronary artery without angina pectoris: Secondary | ICD-10-CM | POA: Diagnosis not present

## 2023-12-27 DIAGNOSIS — R0602 Shortness of breath: Secondary | ICD-10-CM | POA: Diagnosis not present

## 2023-12-27 DIAGNOSIS — Z79899 Other long term (current) drug therapy: Secondary | ICD-10-CM | POA: Insufficient documentation

## 2023-12-27 LAB — BASIC METABOLIC PANEL WITH GFR
Anion gap: 8 (ref 5–15)
BUN: 22 mg/dL (ref 8–23)
CO2: 26 mmol/L (ref 22–32)
Calcium: 9.2 mg/dL (ref 8.9–10.3)
Chloride: 99 mmol/L (ref 98–111)
Creatinine, Ser: 1.08 mg/dL — ABNORMAL HIGH (ref 0.44–1.00)
GFR, Estimated: 52 mL/min — ABNORMAL LOW (ref >60)
Glucose, Bld: 210 mg/dL — ABNORMAL HIGH (ref 70–99)
Potassium: 4.8 mmol/L (ref 3.5–5.1)
Sodium: 133 mmol/L — ABNORMAL LOW (ref 135–145)

## 2023-12-27 LAB — CBC
HCT: 36.5 % (ref 36.0–46.0)
Hemoglobin: 11.8 g/dL — ABNORMAL LOW (ref 12.0–15.0)
MCH: 33.2 pg (ref 26.0–34.0)
MCHC: 32.3 g/dL (ref 30.0–36.0)
MCV: 102.8 fL — ABNORMAL HIGH (ref 80.0–100.0)
Platelets: 277 K/uL (ref 150–400)
RBC: 3.55 MIL/uL — ABNORMAL LOW (ref 3.87–5.11)
RDW: 14.1 % (ref 11.5–15.5)
WBC: 7.2 K/uL (ref 4.0–10.5)
nRBC: 0 % (ref 0.0–0.2)

## 2023-12-27 LAB — TROPONIN I (HIGH SENSITIVITY)
Troponin I (High Sensitivity): 8 ng/L (ref <18)
Troponin I (High Sensitivity): 7 ng/L (ref <18)

## 2023-12-27 LAB — RESP PANEL BY RT-PCR (RSV, FLU A&B, COVID)  RVPGX2
Influenza A by PCR: NEGATIVE
Influenza B by PCR: NEGATIVE
Resp Syncytial Virus by PCR: NEGATIVE
SARS Coronavirus 2 by RT PCR: NEGATIVE

## 2023-12-27 LAB — CBG MONITORING, ED: Glucose-Capillary: 205 mg/dL — ABNORMAL HIGH (ref 70–99)

## 2023-12-27 MED ORDER — SODIUM CHLORIDE 0.9 % IV BOLUS
500.0000 mL | Freq: Once | INTRAVENOUS | Status: AC
  Administered 2023-12-27: 500 mL via INTRAVENOUS

## 2023-12-27 MED ORDER — IPRATROPIUM-ALBUTEROL 0.5-2.5 (3) MG/3ML IN SOLN
3.0000 mL | Freq: Once | RESPIRATORY_TRACT | Status: AC
  Administered 2023-12-27: 3 mL via RESPIRATORY_TRACT
  Filled 2023-12-27: qty 3

## 2023-12-27 NOTE — Telephone Encounter (Signed)
  FYI Only or Action Required?: Action required by provider: update on patient condition.  Patient was last seen in primary care on 12/16/2023 by McGowen, Aleene DEL, MD.  Called Nurse Triage reporting Shortness of Breath.  Symptoms began several weeks ago.  Interventions attempted: Prescription medications: recent hospitalization, antibiotics and Rest, hydration, or home remedies.  Symptoms are: gradually worsening.  Triage Disposition: Go to ED Now (Notify PCP)  Patient/caregiver understands and will follow disposition?: Yes  Copied from CRM [id number]. Topic: Clinical - Red Word Triage >> Dec 27, 2023 11:28 AM Rea BROCKS wrote: Red Word that prompted transfer to Nurse Triage: Patient has pneumonia. They went to the ER last week but patient is still not improving. Complaints of  shortness of breath earlier today, still extremely weak, coughing.   Brandi- Daughter in law called in   patient has follow up visit on friday Reason for Disposition  Oxygen level (e.g., pulse oximetry) 90% or lower  Answer Assessment - Initial Assessment Questions 1. RESPIRATORY STATUS: Describe your breathing? (e.g., wheezing, shortness of breath, unable to speak, severe coughing)      Shortness of breath at rest 2. ONSET: When did this breathing problem begin?     A couple of weeks ago, recently d/c'd from hospital with pneumonia 3. PATTERN Does the difficult breathing come and go, or has it been constant since it started?      constant 4. SEVERITY: How bad is your breathing? (e.g., mild, moderate, severe)      Moderate, difficult to complete sentence 5. RECURRENT SYMPTOM: Have you had difficulty breathing before? If Yes, ask: When was the last time? and What happened that time?      Recent pneumonia 6. CARDIAC HISTORY: Do you have any history of heart disease? (e.g., heart attack, angina, bypass surgery, angioplasty)      Bypass surgery 7. LUNG HISTORY: Do you have any history of lung  disease?  (e.g., pulmonary embolus, asthma, emphysema)     History of pneumonia, otherwise no 8. CAUSE: What do you think is causing the breathing problem?      Unresolved pneumonia 9. OTHER SYMPTOMS: Do you have any other symptoms? (e.g., chest pain, cough, dizziness, fever, runny nose)     Reduced cough 10. O2 SATURATION MONITOR:  Do you use an oxygen saturation monitor (pulse oximeter) at home? If Yes, ask: What is your reading (oxygen level) today? What is your usual oxygen saturation reading? (e.g., 95%)      90%- 95%, HR74 on RA 11. PREGNANCY: Is there any chance you are pregnant? When was your last menstrual period?       N/A 12. TRAVEL: Have you traveled out of the country in the last month? (e.g., travel history, exposures)       N/A  Protocols used: Breathing Difficulty-A-AH

## 2023-12-27 NOTE — Discharge Instructions (Signed)
 You were seen today for reported shortness of breath while also being noted to be dehydrated according to your labs.  Your lab work and imaging otherwise as well as a physical exam were very reassuring that have low suspicion for any emergent cause of your symptoms.  Recommend continue to follow-up with your PCP with your Friday appointment for continued evaluation.  If begin to have any chest pain, fever, persistent vomiting or progressive or worsening weakness, please return to the ED for further evaluation otherwise follow-up with PCP.

## 2023-12-27 NOTE — ED Triage Notes (Signed)
 Pt presents with SOB, pt was admitted x 1 week ago for pneumonia, just completed ABX with no improvement

## 2023-12-27 NOTE — ED Notes (Signed)
 Patient transported to X-ray

## 2023-12-27 NOTE — ED Notes (Signed)
 Pt ambulated in room with this NT. Pt has no CO of SOB, dizziness. Pt HR was elevated around 130s. Pt O2 sat at lowest was 93% on room air. Pt was unsteady on feet. Pt daughter stated she has been using a walker at home. Pt is back in bed at this time HR 73 O2 sat 95% on room air. RN notified

## 2023-12-27 NOTE — ED Provider Notes (Signed)
 [place] EMERGENCY DEPARTMENT AT [facility] Provider Note   CSN: [id number] Arrival date & time: 12/27/23  1301     Patient presents with: Shortness of Breath   Colleene Khai Torbert is a 83 y.o. female.  HPI Patient is an 83 year old female was in the ED today with concerns for shortness of breath that has not improved since her recent discharge from the hospital, noting that she had finished up her Levaquin  today.  States that she had an acute episode of shortness of breath last night, waking her daughter up from bed due to concern for the shortness of breath.  Has had persistent cough as well as dizzy spells while she is ambulating.  Defines dizziness as lightheadedness.  Daughter notes that her cough has improved.  However notes that she still has increased work of breathing while ambulating around the house.  Denies fever, headache, blurry vision, diplopia, dysphagia, dyne aphasia, chest pain, abdominal pain, nausea, vomiting, diarrhea, melena, hematochezia, dysuria, lower leg swelling.    Prior to Admission medications   Medication Sig Start Date End Date Taking? Authorizing Provider  ADMELOG  100 UNIT/ML injection INJECT 40 UNITS INTO THE SKIN DAILY. VIA PUMP 12/12/23   Thapa, [place], MD  aspirin  EC 81 MG tablet Take 1 tablet (81 mg total) by mouth daily. 03/25/17   Darron Deatrice LABOR, MD  Calcium -Magnesium-Vitamin D (CALCIUM  MAGNESIUM PO) Take 1 tablet by mouth daily.    [provider]  carvedilol  (COREG ) 6.25 MG tablet Take 1 tablet (6.25 mg total) by mouth 2 (two) times daily with a meal. 02/28/23   Agbor-Etang, Redell, MD  Coenzyme Q10 (CO Q 10 PO) Take 1 capsule by mouth daily.    [provider]  Continuous Blood Gluc Sensor (DEXCOM G6 SENSOR) MISC 1 each by Does not apply route See admin instructions. Use one sensor once every 10 days to monitor blood sugars. 01/12/22   Von [place], MD  Continuous Blood Gluc Transmit (DEXCOM G6 TRANSMITTER) MISC 1  each by Does not apply route See admin instructions. Use one transmitter once every 90 days to transmit blood sugars.    [provider]  dextromethorphan-guaiFENesin (MUCINEX DM) 30-600 MG 12hr tablet Take 1 tablet by mouth 2 (two) times daily.    [provider]  ezetimibe  (ZETIA ) 10 MG tablet Take 1 tablet by mouth daily. 05/30/23   Thapa, [place], MD  Glucagon  (GVOKE HYPOPEN  1-PACK) 1 MG/0.2ML SOAJ Inject 1 mg into the skin as needed (low blood sugar with impaired consciousness). Patient not taking: Reported on 12/23/2023 01/07/23   Thapa, [place], MD  glucose blood test strip Use Contour Next test strips as instructed to check blood sugar 4 times daily. 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 02/26/21   [provider]  isosorbide  mononitrate (IMDUR ) 30 MG 24 hr tablet Take 1 tablet by mouth twice daily. 11/25/23   Darron Deatrice LABOR, MD  lactobacillus acidophilus & bulgar (LACTINEX) chewable tablet Chew 1 tablet by mouth 3 (three) times daily with meals for 5 days. 12/22/23 12/27/23  ShahmehdiAdriana LABOR, MD  levofloxacin  (LEVAQUIN ) 750 MG tablet Take 1 tablet (750 mg total) by mouth daily for 5 days. 12/23/23 12/28/23  ShahmehdiAdriana LABOR, MD  lisinopril  (ZESTRIL ) 10 MG tablet Take 1 tablet by mouth  daily. 10/26/23   Thapa, [place], MD  Multiple Vitamin (MULTIVITAMIN) tablet Take 1 tablet by mouth daily.    [provider]  rosuvastatin  (CRESTOR ) 40 MG tablet Take 1 tablet by mouth daily. 05/30/23   Thapa, [place], MD    Allergies: Patient has no known allergies.    Review of Systems  HENT:  Positive for congestion.   Respiratory:  Positive for cough.   All other systems reviewed and are negative.   Updated Vital Signs BP 139/64   Pulse 73   Temp 97.9 F (36.6 C) (Oral)   Resp 14   Ht 5' 4  (1.626 m)   Wt 74.4 kg   SpO2 97%   BMI 28.15 kg/m   Physical Exam Vitals and nursing note reviewed.  Constitutional:      General: She is not in acute distress.    Appearance: Normal appearance. She is not ill-appearing or diaphoretic.  HENT:     Head: Normocephalic and atraumatic.     Mouth/Throat:     Mouth: Mucous membranes are moist.     Pharynx: Oropharynx is clear. No oropharyngeal exudate or posterior oropharyngeal erythema.  Eyes:     General: No scleral icterus.       Right eye: No discharge.        Left eye: No discharge.     Extraocular Movements: Extraocular movements intact.     Conjunctiva/sclera: Conjunctivae normal.  Cardiovascular:     Rate and Rhythm: Normal rate and regular rhythm.     Pulses: Normal pulses.     Heart sounds: Normal heart sounds. No murmur heard.    No friction rub. No gallop.  Pulmonary:     Effort: Pulmonary effort is normal. No respiratory distress.     Breath sounds: No stridor. No wheezing, rhonchi or rales.     Comments: Lung sounds are notably diminished bilaterally. Chest:     Chest wall: No tenderness.  Abdominal:     General: Abdomen is flat. There is no distension.     Palpations: Abdomen is soft.     Tenderness: There is no abdominal tenderness. There is no right CVA tenderness, left CVA tenderness, guarding or rebound.  Musculoskeletal:        General: No swelling, deformity or signs of injury.     Cervical back: Normal range of motion. No rigidity.     Right lower leg: No edema.     Left lower leg: No edema.  Skin:    General: Skin is warm and dry.     Findings: No bruising, erythema or lesion.  Neurological:     General: No focal deficit present.     Mental Status: She is alert. Mental status is at baseline.     Sensory: No sensory deficit.     Motor: No weakness.     Comments: Alert and oriented x 1 to self, at baseline per daughter.  No facial asymmetry, no ataxia, no apraxia, no aphasia,  normal sensation to  both upper and lower extremities bilaterally, normal grip strength bilaterally, normal strength to both flexion and extension to both upper lower extremities 5+ bilaterally, no nystagmus.   Psychiatric:        Mood and Affect: Mood normal.     (all labs ordered are listed, but only abnormal results are displayed) Labs Reviewed  BASIC METABOLIC PANEL WITH GFR - Abnormal; Notable for the following components:      Result Value   Sodium 133 (*)  Glucose, Bld 210 (*)    Creatinine, Ser 1.08 (*)    GFR, Estimated 52 (*)    All other components within normal limits  CBC - Abnormal; Notable for the following components:   RBC 3.55 (*)    Hemoglobin 11.8 (*)    MCV 102.8 (*)    All other components within normal limits  CBG MONITORING, ED - Abnormal; Notable for the following components:   Glucose-Capillary 205 (*)    All other components within normal limits  RESP PANEL BY RT-PCR (RSV, FLU A&B, COVID)  [id number]  TROPONIN I (HIGH SENSITIVITY)  TROPONIN I (HIGH SENSITIVITY)    EKG: EKG Interpretation Date/Time:  Tuesday December 27 2023 13:45:45 EDT Ventricular Rate:  76 PR Interval:  201 QRS Duration:  93 QT Interval:  405 QTC Calculation: 456 R Axis:   9  Text Interpretation: Sinus rhythm Low voltage, precordial leads No acute changes No significant change since last tracing Confirmed by Charlyn Sora [phone]) on 12/27/2023 4:01:50 PM  Radiology: [facility] Chest 2 View Result Date: 12/27/2023 CLINICAL DATA:  Short of breath, recent pneumonia EXAM: CHEST - 2 VIEW COMPARISON:  12/20/2023 FINDINGS: Frontal and lateral views of the chest demonstrate a stable cardiac silhouette. Interval improvement in the left perihilar nodular airspace disease seen previously. Trace left pleural effusion again noted. No new consolidation or pneumothorax. No acute bony abnormalities. IMPRESSION: 1. Improving left perihilar airspace disease. 2. Stable trace left pleural effusion. Electronically Signed   By:  Ozell Daring M.D.   On: 12/27/2023 15:01    Procedures   Medications Ordered in the ED  ipratropium-albuterol  (DUONEB) 0.5-2.5 (3) MG/3ML nebulizer solution 3 mL (3 mLs Nebulization Given 12/27/23 1400)  sodium chloride  0.9 % bolus 500 mL (0 mLs Intravenous Stopped 12/27/23 1724)                                  Medical Decision Making  This patient is a 83 year old female who presents to the ED for concern of persistent shortness of breath after recently being treated for pneumonia, discharged from hospital 5 days ago after receiving IV Rocephin  and azithromycin  sent home on Levaquin .  Notes that she had just finished up her last antibiotic today however has had an acute shortness of breath episode last night, called PCP this morning and was told come to the ED for evaluation.  On physical exam, patient is in no acute distress, afebrile, alert and orient x 1 to self noted to be at baseline per daughter, speaking in full sentences, nontachypneic, nontachycardic.  Lung sounds are mildly diminished bilaterally however no wheezes, rhonchi noted.  RRR.  No lower leg edema.  Abdomen is nontender, no CVA tenderness.  Oropharynx is clear, patient at baseline, with no focal deficits.  Initially provided some DuoNebs with minimal effect.  Also gave fluids due to dehydration.  Lab work and imaging were repeated, showing dehydration but otherwise reassuring.  Trace pleural effusions which was noted from previous chest x-ray.  Low suspicion for PE, ACS, pneumonia, pneumothorax.  Patient was ambulated and had maintain oxygen saturations of 93% on room air and maintained ox saturations of 97 to 98% on room air at rest.  No recurrence of symptoms.  With reassuring lab work, will recommend she needs to follow-up with her PCP for her already scheduled Friday appointment.  Patient vital signs have remained stable throughout the course of patient's time in the  ED. Low suspicion for any other emergent pathology at  this time. I believe this patient is safe to be discharged. Provided strict return to ER precautions. Patient and daughter expressed agreement and understanding of plan. All questions were answered.  Differential diagnoses prior to evaluation: The emergent differential diagnosis includes, but is not limited to,  CHF, pericardial effusion/tamponade, arrhythmias, ACS, COPD, asthma, bronchitis, pneumonia, pneumothorax, PE, anemia  . This is not an exhaustive differential.   Past Medical History / Co-morbidities / Social History: Type 1 diabetes, PAD, CAD, HTN, diabetic neuropathy, HLD, status post CABG  Additional history: Chart reviewed. Pertinent results include:   Patient was recently discharged on 12/22/2023 after being treated for acute respiratory failure with hypoxia secondary to pneumonia.  Provided azithromycin , Rocephin  and switched to Levaquin .  Initially hypoxic at 85% on room air, discharged on 92% on room air  Lab Tests/Imaging studies: I personally interpreted labs/imaging and the pertinent results include:   CBC notes a mild anemia with hemoglobin 1.8 otherwise unremarkable BMP notes a dehydration with an elevated creatinine of 1.08 and GFR 52 Troponin and delta unremarkable Respiratory panel is unremarkable Chest x-ray similar to previous  I agree with the radiologist interpretation.  Cardiac monitoring: EKG obtained and interpreted by myself and attending physician which shows: Sinus rhythm with no significant change from previous EKGs.   Medications: I ordered medication including DuoNebs, NS.  I have reviewed the patients home medicines and have made adjustments as needed.  Critical Interventions: None  Social Determinants of Health: Patient is living with daughter who helps with caretaking as well as has already scheduled a follow-up appointment with PCP on Friday  Disposition: After consideration of the diagnostic results and the patients response to treatment, I  feel that the patient would benefit from discharge and treatment as above.   emergency department workup does not suggest an emergent condition requiring admission or immediate intervention beyond what has been performed at this time. The plan is: Follow-up with PCP, return for new or worsening symptoms. The patient is safe for discharge and has been instructed to return immediately for worsening symptoms, change in symptoms or any other concerns.   Final diagnoses:  Shortness of breath  Dehydration    ED Discharge Orders     None          Beola Terrall RAMAN, [place] 12/27/23 1826    Charlyn Sora, MD 01/02/24 [phone]

## 2023-12-29 ENCOUNTER — Ambulatory Visit: Admitting: Podiatry

## 2023-12-30 ENCOUNTER — Ambulatory Visit: Admitting: Nurse Practitioner

## 2023-12-30 ENCOUNTER — Encounter: Payer: Self-pay | Admitting: Nurse Practitioner

## 2023-12-30 VITALS — BP 128/60 | HR 72 | Temp 97.9°F | Ht 64.0 in | Wt 163.2 lb

## 2023-12-30 DIAGNOSIS — R42 Dizziness and giddiness: Secondary | ICD-10-CM

## 2023-12-30 DIAGNOSIS — I10 Essential (primary) hypertension: Secondary | ICD-10-CM | POA: Diagnosis not present

## 2023-12-30 DIAGNOSIS — J189 Pneumonia, unspecified organism: Secondary | ICD-10-CM

## 2023-12-30 DIAGNOSIS — D649 Anemia, unspecified: Secondary | ICD-10-CM | POA: Diagnosis not present

## 2023-12-30 DIAGNOSIS — E103553 Type 1 diabetes mellitus with stable proliferative diabetic retinopathy, bilateral: Secondary | ICD-10-CM | POA: Diagnosis not present

## 2023-12-30 DIAGNOSIS — L97522 Non-pressure chronic ulcer of other part of left foot with fat layer exposed: Secondary | ICD-10-CM | POA: Insufficient documentation

## 2023-12-30 NOTE — Assessment & Plan Note (Signed)
 Has upcoming appointment for brain MRI Under the care of neurology Last carotid US  2024: chronically occluded right carotid artery and mild stenosis in the left side.  Repeat study in 1 year per cardiology. Current use or statin and BP at goal

## 2023-12-30 NOTE — Patient Instructions (Signed)
 Schedule lab appointment to repeat renal function in 2-3weeks. Maintain daily physical and breathing exercise. Maintain current medications.  How to Do Breathing Exercises (Pursed Lip Breathing) Pursed lip breathing is a breathing exercise to help with the feeling of being short of breath. Pursed lip breathing keeps your airways open longer when you breathe out, or exhale, and it allows more air to leave your lungs. Pursed lip breathing may be helpful for long-term breathing problems such as chronic obstructive pulmonary disease (COPD) or asthma. It may also be helpful for: Pain, stress, or anxiety. Problems with swallowing or the vocal cords. Trouble breathing due to health problems like cancer. Pursed lip breathing helps to slow down your breathing and may help you be able to do more activities. Use the pursed lip technique during the hardest part of any activity, such as climbing stairs. How to do pursed lip breathing Before you start, take a minute to relax your shoulders and close your eyes. Then: Start by closing your mouth. Breathe in through your nose, taking a normal breath. Do this at your normal rate of breathing. If you feel you're not getting enough air, breathe in while slowly counting to 2 or 3. Pucker or purse your lips, as if you're going to whistle. Gently tighten the muscles of your bellyor press on your belly. Breathe out slowly through your pursed lips. Take at least twice as long to breathe out as it takes you to breathe in. Make sure you breathe out all the air, but don't force air out. Ask your health care provider how often and how long to do this exercise. Follow these instructions at home: Take medicines only as told. Do not smoke, vape, or use nicotine or tobacco. Ask your provider what activities are safe for you. Where to find more information To learn more: Go to [employer] at [place]. Click Search and type breathing exercises. Find the  link you need. Contact a health care provider if: You have problems with this breathing exercise. Your shortness of breath gets worse. You find it harder to exercise or be active. You have a cough. You have a fever or chills. Get help right away if: You have extreme trouble breathing. You pass out or faint. These symptoms may be an emergency. Call 911 right away. Do not wait to see if the symptoms will go away. Do not drive yourself to the hospital. This information is not intended to replace advice given to you by your health care provider. Make sure you discuss any questions you have with your health care provider. Document Revised: 03/31/2023 Document Reviewed: 03/31/2023 Elsevier Patient Education  2025 [employer].

## 2023-12-30 NOTE — Assessment & Plan Note (Addendum)
 Hospitalized 09/03-09/07/2023 Reviewed lab results and radiology reports. TCM call completed 12/23/2023 She had another ED visit on 12/27/2023 due to acute SOB and dizziness Denies SOB with exertion today. No hypoxia or tachycardia noted today. Has Completed levaquin  750mg  x 7days Had repeat CXR 12/27/2023: Improving left perihilar airspace disease. Stable trace left pleural effusion. Brandi reports generalized weakness and unsteady gait since hospital discharge. Current use of cane or walker to ambulate, no fall. She denies need for home PT. She opted to resume home exercises provided during last home PT session. Encouraged to also practice deep breathing exercises. Repeat BMP, and CBC in 2-3weeks

## 2023-12-30 NOTE — Assessment & Plan Note (Signed)
 UTD with annual eye exam

## 2023-12-30 NOTE — Progress Notes (Signed)
 Established Patient Visit  Patient: Natasha Hernandez   DOB: 1940/06/23   83 y.o. Female  MRN: [id number] Visit Date: 12/30/2023  Subjective:    Chief Complaint  Patient presents with   Follow-up    Recent ED visit on 12/27/23 and dizziness lasting longer    HPI Accompanied by Brandi-daughter in law DM retinopathy (HCC) UTD with annual eye exam  CAP (community acquired pneumonia) Hospitalized 09/03-09/07/2023 Reviewed lab results and radiology reports. TCM call completed 12/23/2023 She had another ED visit on 12/27/2023 due to acute SOB and dizziness Denies SOB with exertion today. No hypoxia or tachycardia noted today. Has Completed levaquin  750mg  x 7days Had repeat CXR 12/27/2023: Improving left perihilar airspace disease. Stable trace left pleural effusion. Brandi reports generalized weakness and unsteady gait since hospital discharge. Current use of cane or walker to ambulate, no fall. She denies need for home PT. She opted to resume home exercises provided during last home PT session. Encouraged to also practice deep breathing exercises. Repeat BMP, and CBC in 2-3weeks  Postural dizziness Has upcoming appointment for brain MRI Under the care of neurology Last carotid US  2024: chronically occluded right carotid artery and mild stenosis in the left side.  Repeat study in 1 year per cardiology. Current use or statin and BP at goal  Reviewed medical, surgical, and social history today  Medications: Outpatient Medications Prior to Visit  Medication Sig   ADMELOG  100 UNIT/ML injection INJECT 40 UNITS INTO THE SKIN DAILY. VIA PUMP   aspirin  EC 81 MG tablet Take 1 tablet (81 mg total) by mouth daily.   Calcium -Magnesium-Vitamin D (CALCIUM  MAGNESIUM PO) Take 1 tablet by mouth daily.   carvedilol  (COREG ) 6.25 MG tablet Take 1 tablet (6.25 mg total) by mouth 2 (two) times daily with a meal.   Coenzyme Q10 (CO Q 10 PO) Take 1 capsule by mouth daily.   Continuous  Blood Gluc Sensor (DEXCOM G6 SENSOR) MISC 1 each by Does not apply route See admin instructions. Use one sensor once every 10 days to monitor blood sugars.   Continuous Blood Gluc Transmit (DEXCOM G6 TRANSMITTER) MISC 1 each by Does not apply route See admin instructions. Use one transmitter once every 90 days to transmit blood sugars.   ezetimibe  (ZETIA ) 10 MG tablet Take 1 tablet by mouth daily.   glucose blood test strip Use Contour Next test strips as instructed to check blood sugar 4 times daily. 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   isosorbide  mononitrate (IMDUR ) 30 MG 24 hr tablet Take 1 tablet by mouth twice daily.   lisinopril  (ZESTRIL ) 10 MG tablet Take 1 tablet by mouth daily.   Multiple Vitamin (MULTIVITAMIN) tablet Take 1 tablet by mouth daily.   rosuvastatin  (CRESTOR ) 40 MG tablet Take 1 tablet by mouth daily.   Glucagon  (GVOKE HYPOPEN  1-PACK) 1 MG/0.2ML SOAJ Inject 1 mg into the skin as needed (low blood sugar with impaired consciousness). (Patient not taking: Reported on 12/23/2023)   [DISCONTINUED] dextromethorphan-guaiFENesin (MUCINEX DM) 30-600 MG 12hr tablet Take 1 tablet by mouth 2 (two) times daily. (Patient not taking: Reported on 12/30/2023)   No facility-administered medications prior to visit.   Reviewed past medical and social history.   ROS per HPI above  Objective:  BP 128/60 (BP Location: Right Arm, Patient Position: Sitting, Cuff Size: Large)   Pulse 72   Temp 97.9 F (36.6 C) (Oral)   Ht 5' 4 (1.626 m)   Wt 163 lb 3.2 oz (74 kg)   SpO2 97%   BMI 28.01 kg/m      Physical Exam Vitals and nursing note reviewed.  Cardiovascular:     Rate and Rhythm: Normal rate and regular rhythm.     Pulses: Normal pulses.     Heart sounds: Normal heart sounds.  Neurological:     Mental Status: She is alert and oriented to  person, place, and time.     Cranial Nerves: No cranial nerve deficit.     Gait: Gait abnormal.  Psychiatric:        Mood and Affect: Mood normal.        Behavior: Behavior normal.        Thought Content: Thought content normal.     No results found for any visits on 12/30/23.    Assessment & Plan:    Problem List Items Addressed This Visit     CAP (community acquired pneumonia) - Primary   Hospitalized 09/03-09/07/2023 Reviewed lab results and radiology reports. TCM call completed 12/23/2023 She had another ED visit on 12/27/2023 due to acute SOB and dizziness Denies SOB with exertion today. No hypoxia or tachycardia noted today. Has Completed levaquin  750mg  x 7days Had repeat CXR 12/27/2023: Improving left perihilar airspace disease. Stable trace left pleural effusion. Brandi reports generalized weakness and unsteady gait since hospital discharge. Current use of cane or walker to ambulate, no fall. She denies need for home PT. She opted to resume home exercises provided during last home PT session. Encouraged to also practice deep breathing exercises. Repeat BMP, and CBC in 2-3weeks      Relevant Orders   Renal Function Panel   DM retinopathy (HCC)   UTD with annual eye exam      Essential hypertension (Chronic)   Relevant Orders   Renal Function Panel   Postural dizziness   Has upcoming appointment for brain MRI Under the care of neurology Last carotid US  2024: chronically occluded right carotid artery and mild stenosis in the left side.  Repeat study in 1 year per cardiology. Current use or statin and BP at goal       Other Visit Diagnoses       Dizziness       Relevant Orders   Renal Function Panel     Anemia, unspecified type       Relevant Orders   CBC with Differential/Platelet   Iron, TIBC and Ferritin Panel      Return in about 6 months (around 06/28/2024) for HTN, DM, hyperlipidemia (fasting).     Roselie Mood, NP

## 2023-12-31 ENCOUNTER — Other Ambulatory Visit

## 2024-01-03 DIAGNOSIS — H35373 Puckering of macula, bilateral: Secondary | ICD-10-CM | POA: Diagnosis not present

## 2024-01-03 DIAGNOSIS — E103593 Type 1 diabetes mellitus with proliferative diabetic retinopathy without macular edema, bilateral: Secondary | ICD-10-CM | POA: Diagnosis not present

## 2024-01-03 DIAGNOSIS — H18421 Band keratopathy, right eye: Secondary | ICD-10-CM | POA: Diagnosis not present

## 2024-01-11 ENCOUNTER — Ambulatory Visit

## 2024-01-11 NOTE — Progress Notes (Signed)
 Patient was here and fit with inserts however ppw has expired  Refaxed for new dates  Lolita Schultze CPed, CFo, Cfm

## 2024-01-18 DIAGNOSIS — E113553 Type 2 diabetes mellitus with stable proliferative diabetic retinopathy, bilateral: Secondary | ICD-10-CM | POA: Diagnosis not present

## 2024-01-18 DIAGNOSIS — Z961 Presence of intraocular lens: Secondary | ICD-10-CM | POA: Diagnosis not present

## 2024-01-18 DIAGNOSIS — H43392 Other vitreous opacities, left eye: Secondary | ICD-10-CM | POA: Diagnosis not present

## 2024-01-18 DIAGNOSIS — H18423 Band keratopathy, bilateral: Secondary | ICD-10-CM | POA: Diagnosis not present

## 2024-01-20 ENCOUNTER — Other Ambulatory Visit (INDEPENDENT_AMBULATORY_CARE_PROVIDER_SITE_OTHER)

## 2024-01-20 DIAGNOSIS — I10 Essential (primary) hypertension: Secondary | ICD-10-CM

## 2024-01-20 DIAGNOSIS — J189 Pneumonia, unspecified organism: Secondary | ICD-10-CM

## 2024-01-20 DIAGNOSIS — R42 Dizziness and giddiness: Secondary | ICD-10-CM | POA: Diagnosis not present

## 2024-01-20 DIAGNOSIS — D649 Anemia, unspecified: Secondary | ICD-10-CM

## 2024-01-20 LAB — CBC WITH DIFFERENTIAL/PLATELET
Basophils Absolute: 0 K/uL (ref 0.0–0.1)
Basophils Relative: 0.8 % (ref 0.0–3.0)
Eosinophils Absolute: 0.2 K/uL (ref 0.0–0.7)
Eosinophils Relative: 4 % (ref 0.0–5.0)
HCT: 38.8 % (ref 36.0–46.0)
Hemoglobin: 12.7 g/dL (ref 12.0–15.0)
Lymphocytes Relative: 18.3 % (ref 12.0–46.0)
Lymphs Abs: 0.7 K/uL (ref 0.7–4.0)
MCHC: 32.7 g/dL (ref 30.0–36.0)
MCV: 99.2 fl (ref 78.0–100.0)
Monocytes Absolute: 0.3 K/uL (ref 0.1–1.0)
Monocytes Relative: 8.1 % (ref 3.0–12.0)
Neutro Abs: 2.7 K/uL (ref 1.4–7.7)
Neutrophils Relative %: 68.8 % (ref 43.0–77.0)
Platelets: 163 K/uL (ref 150.0–400.0)
RBC: 3.92 Mil/uL (ref 3.87–5.11)
RDW: 14.5 % (ref 11.5–15.5)
WBC: 4 K/uL (ref 4.0–10.5)

## 2024-01-20 LAB — RENAL FUNCTION PANEL
Albumin: 4.2 g/dL (ref 3.5–5.2)
BUN: 27 mg/dL — ABNORMAL HIGH (ref 6–23)
CO2: 32 meq/L (ref 19–32)
Calcium: 10 mg/dL (ref 8.4–10.5)
Chloride: 101 meq/L (ref 96–112)
Creatinine, Ser: 0.93 mg/dL (ref 0.40–1.20)
GFR: 57.51 mL/min — ABNORMAL LOW (ref >60.00)
Glucose, Bld: 148 mg/dL — ABNORMAL HIGH (ref 70–99)
Phosphorus: 5 mg/dL — ABNORMAL HIGH (ref 2.3–4.6)
Potassium: 5 meq/L (ref 3.5–5.1)
Sodium: 140 meq/L (ref 135–145)

## 2024-01-21 LAB — IRON,TIBC AND FERRITIN PANEL
%SAT: 25 % (ref 16–45)
Ferritin: 24 ng/mL (ref 16–288)
Iron: 81 ug/dL (ref 45–160)
TIBC: 323 ug/dL (ref 250–450)

## 2024-01-23 ENCOUNTER — Ambulatory Visit: Admitting: Podiatry

## 2024-01-23 ENCOUNTER — Encounter: Payer: Self-pay | Admitting: Podiatry

## 2024-01-23 DIAGNOSIS — E1042 Type 1 diabetes mellitus with diabetic polyneuropathy: Secondary | ICD-10-CM

## 2024-01-23 DIAGNOSIS — M79674 Pain in right toe(s): Secondary | ICD-10-CM

## 2024-01-23 DIAGNOSIS — Q6671 Congenital pes cavus, right foot: Secondary | ICD-10-CM

## 2024-01-23 DIAGNOSIS — M79675 Pain in left toe(s): Secondary | ICD-10-CM | POA: Diagnosis not present

## 2024-01-23 DIAGNOSIS — L84 Corns and callosities: Secondary | ICD-10-CM

## 2024-01-23 DIAGNOSIS — M2041 Other hammer toe(s) (acquired), right foot: Secondary | ICD-10-CM

## 2024-01-23 DIAGNOSIS — M2042 Other hammer toe(s) (acquired), left foot: Secondary | ICD-10-CM | POA: Diagnosis not present

## 2024-01-23 DIAGNOSIS — Z0189 Encounter for other specified special examinations: Secondary | ICD-10-CM

## 2024-01-23 DIAGNOSIS — B351 Tinea unguium: Secondary | ICD-10-CM | POA: Diagnosis not present

## 2024-01-23 DIAGNOSIS — E119 Type 2 diabetes mellitus without complications: Secondary | ICD-10-CM

## 2024-01-23 NOTE — Progress Notes (Signed)
 Subjective:  Patient ID: Natasha Hernandez, female    DOB: 06/20/1940,  MRN: [id number]  Natasha Hernandez presents to clinic today for for annual diabetic foot examination, at risk foot care with history of diabetic neuropathy, and callus(es) right lower extremity and painful thick toenails that are difficult to trim. Painful toenails interfere with ambulation. Aggravating factors include wearing enclosed shoe gear. Pain is relieved with periodic professional debridement. Painful calluses are aggravated when weightbearing with and without shoegear. Pain is relieved with periodic professional debridement. Today, we are testing out her 9 week interval. Daughter states she trimmed her nails a little. They relate no issue with plantar callus or development of plantar wound of right foot.  Patient is awaiting delivery of new diabetic insoles. Natasha Hernandez will contact them when our office receives them. Chief Complaint  Patient presents with   Toe Pain    RFC. NP Nche is her PCP. Last office visit was this month.   New problem(s): None.   PCP is Nche, Roselie Rockford, NP.  No Known Allergies  Review of Systems: Negative except as noted in the HPI.  Objective: No changes noted in today's physical examination. There were no vitals filed for this visit. Natasha Hernandez is a pleasant 83 y.o. female WD, WN in NAD. AAO x 3.   Diabetic foot exam was performed with the following findings:   Vascular Examination: CFT <3 seconds b/l. DP/PT pulses faintly palpable b/l. Pedal edema absent. Skin temperature gradient warm to warm b/l. Digital hair absent. No pain with calf compression. No ischemia or gangrene. No cyanosis or clubbing noted b/l. No edema noted b/l LE.   Neurological Examination: Pt has subjective symptoms of neuropathy. Protective sensation diminished with 10g monofilament b/l. Vibratory sensation intact b/l.  Dermatological Examination: Pedal skin thin, shiny and atrophic b/l.  No open  wounds. No interdigital macerations.  Toenails 1-5 b/l thick, discolored, elongated with subungual debris and pain on dorsal palpation.    Hyperkeratotic lesion(s) submet head 5 right foot.  No erythema, no edema, no drainage, no fluctuance.  Musculoskeletal Examination: Muscle strength 5/5 to all lower extremity muscle groups bilaterally. Severe hammertoe deformity noted 1-5 left foot and 1-5 right foot. Pes cavus deformity noted b/l lower extremities.  Radiographs: None    Assessment/Plan: 1. Pain due to onychomycosis of toenails of both feet   2. Callus   3. Acquired hammertoes of both feet   4. Pes cavus of both feet   5. 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)   6. Encounter for diabetic foot exam (HCC)   Diabetic foot examination performed. All patient's and/or POA's questions/concerns addressed on today's visit. Patient did well with 9 week interval with no development of wounds. Mycotic toenails 1-5 debrided in length and girth without incident. Callus(es) submet head 5 right foot pared with sharp debridement without incident. Continue daily foot inspections and monitor blood glucose per PCP/Endocrinologist's recommendations. Continue soft, supportive shoe gear daily. Report any pedal injuries to medical professional. Call office if there are any questions/concerns. -Patient awaiting delivery of new diabetic insoles. Natasha Hernandez will contact family when insoles arrive.  Return in about 9 weeks (around 03/26/2024).  Natasha Hernandez, DPM      [facility] LOCATION: [address]  [place], [place] [address]                   Office [phone]   [facility] LOCATION: [address] [place], [place] [address] Office [phone]

## 2024-01-25 ENCOUNTER — Ambulatory Visit
Admission: RE | Admit: 2024-01-25 | Discharge: 2024-01-25 | Disposition: A | Source: Ambulatory Visit | Attending: Neurology | Admitting: Neurology

## 2024-01-25 ENCOUNTER — Ambulatory Visit: Admitting: Endocrinology

## 2024-01-25 ENCOUNTER — Other Ambulatory Visit

## 2024-01-25 DIAGNOSIS — F0150 Vascular dementia without behavioral disturbance: Secondary | ICD-10-CM

## 2024-01-25 DIAGNOSIS — Z8673 Personal history of transient ischemic attack (TIA), and cerebral infarction without residual deficits: Secondary | ICD-10-CM

## 2024-01-25 MED ORDER — GADOPICLENOL 0.5 MMOL/ML IV SOLN
7.5000 mL | Freq: Once | INTRAVENOUS | Status: AC | PRN
  Administered 2024-01-25: 7.5 mL via INTRAVENOUS

## 2024-01-26 ENCOUNTER — Ambulatory Visit: Payer: Self-pay | Admitting: Nurse Practitioner

## 2024-01-31 ENCOUNTER — Telehealth: Payer: Self-pay

## 2024-01-31 NOTE — Telephone Encounter (Signed)
 LM for patients daughter ok to Pu inserts and I can take to [place] if easier for them to PU there  Lolita Schultze

## 2024-02-06 ENCOUNTER — Other Ambulatory Visit: Payer: Self-pay | Admitting: Endocrinology

## 2024-02-06 DIAGNOSIS — E1065 Type 1 diabetes mellitus with hyperglycemia: Secondary | ICD-10-CM

## 2024-02-08 ENCOUNTER — Other Ambulatory Visit: Payer: Self-pay | Admitting: Cardiology

## 2024-02-09 NOTE — Telephone Encounter (Signed)
 Spoke to daughter in law Gave her results of  brain MRI. SABRA Pac Daughter in law recommendations . Daughter in law declines Sleep study for patient . Daughter in law wanted to know if Patient needing  a f/u appointment With dr Inocente . Will forward question to dr Buck

## 2024-02-09 NOTE — Telephone Encounter (Signed)
 For now, I think she can FU closely with PCP. I would be happy to see her as needed.

## 2024-02-09 NOTE — Telephone Encounter (Signed)
-----   Message from True Mar sent at 01/30/2024  3:30 PM EDT ----- Please call patient's family member on DPR regarding her brain MRI.  It shows chronic changes including atrophy which means volume loss and moderate vascular changes including hardening of the  arteries but also progression of microhemorrhages which are small areas of bleeding which accumulate over time and can be seen in the spectrum of dementia, also in the context of high blood pressure  and hardening of the arteries.  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 in  December 2020).  The microhemorrhages which are the small areas of bleeding are chronic and were noted in 2020 but have progressed over time which is not uncommon.  Chronic changes including  hardening of the arteries and microhemorrhages do tend to worsen over time.  No immediate action is required, but we usually recommend maintaining good blood pressure, working on risk factor  reduction including maintaining good cholesterol management, diabetes management, weight management, hydrating well, and avoiding blood thinners or high-dose anti-inflammatory medication which can  also thin the blood.  I would recommend evaluation for sleep apnea as obstructive sleep apnea is a risk factor for dementia and stroke and vascular disease in general.  Let me know if she would like  to proceed, I would be happy to order a study. ----- Message ----- From: Margaret Eduard SAUNDERS, MD Sent: 01/29/2024  10:19 AM EDT To: True Mar, MD

## 2024-02-10 NOTE — Telephone Encounter (Signed)
 Please contact pt for future appointment. Pt overdue for follow up appointment.

## 2024-02-15 NOTE — Telephone Encounter (Addendum)
 Called daughter in law at [phone]. LVM for her to call

## 2024-02-20 ENCOUNTER — Ambulatory Visit: Admitting: Endocrinology

## 2024-02-22 ENCOUNTER — Encounter: Payer: Self-pay | Admitting: Endocrinology

## 2024-02-22 ENCOUNTER — Ambulatory Visit: Payer: Self-pay | Admitting: Endocrinology

## 2024-02-22 ENCOUNTER — Ambulatory Visit: Admitting: Endocrinology

## 2024-02-22 VITALS — BP 120/60 | HR 93 | Resp 20 | Ht 64.0 in | Wt 165.4 lb

## 2024-02-22 DIAGNOSIS — E1065 Type 1 diabetes mellitus with hyperglycemia: Secondary | ICD-10-CM

## 2024-02-22 LAB — POCT GLYCOSYLATED HEMOGLOBIN (HGB A1C): Hemoglobin A1C: 7.6 % — AB (ref 4.0–5.6)

## 2024-02-22 NOTE — Progress Notes (Unsigned)
 Outpatient Endocrinology Note Natasha Prestwood, MD  02/22/24  Patient's Name: Natasha Hernandez    DOB: Jul 03, 1940    MRN: [id number]                                                    REASON OF VISIT: Follow up for type 1 diabetes mellitus  PCP: Nche, Roselie Rockford, NP  HISTORY OF PRESENT ILLNESS:   Natasha Hernandez is a 83 y.o. old female with past medical history listed below, is here for follow up for type 1 diabetes mellitus.   Pertinent Diabetes History: Patient was diagnosed with type 1 diabetes mellitus in 1953.  Patient had been on various insulin  regimen over the years.  At this time she has been on OmniPod 5 insulin  pump with Dexcom G6 CGM since November 2022.  Patient's daughter-in-law is supervising for managing the insulin  pump at home.  Chronic Diabetes Complications : Retinopathy: yes.  Has proliferative but a stable retinopathy, regularly following with ophthalmology. Nephropathy: CKD, on lisinopril  Peripheral neuropathy: yes, followed up with podiatry. Coronary artery disease: yes Stroke: no  Relevant comorbidities and cardiovascular risk factors: Obesity: no Body mass index is 28.39 kg/m.  Hypertension: yes Hyperlipidemia. Yes, on statin.   Current / Home Diabetic regimen includes:  OmniPod 5 insulin  pump with Dexcom G6 CGM, using Admelog  U100  Insulin  Pump setting:  Basal ( 18.1 units / day) MN- 0.7 u/hour  2:30AM-0.6  6AM- 0.8 3PM- 0.8 6PM-   0.85 9PM-   0.7  Bolus CHO Ratio (1unit:CHO) MN- 1:8 11AM- 1:8 3PM-    1:8  Correction/Sensitivity: MN- 1:30  Target: 120-150    Active insulin  time: 2.5 hours  # On the automated mode patient usually gets a lot of alarms and patient also not able to change back to automated mode.  For the convenience patient likes to use on manual mode.  Prior diabetic medications:  CONTINUOUS GLUCOSE MONITORING SYSTEM (CGMS) / INSULIN  PUMP INTERPRETATION:                         OmniPod 5 Pump & Sensor  Download (Reviewed and summarized below.) Dates: October 23 to February 22, 2024 last 14 days  Glucose Management Indicator: 8.5%  CGM/Sensor usage: 92%   Average total daily insulin : 31 units, Basal: 55%, Bolus: 45%.  Automated mode in Use: 0%, Manual mode 100%   Previous   Trends: Frequent hyperglycemia postprandially usually mild 200 250 range and occasionally up to 350 range related to late meal bolus and sometimes missing meal bolus.  Blood sugar at times acceptable in between the meals and overnight.  Blood sugar trending down by the morning in the normal range with no hypoglycemia.  Some of the days acceptable blood sugar throughout the day.  No hypoglycemia.  Hypoglycemia: Patient has no hypoglycemic episodes. Patient has hypoglycemia awareness.    Factors modifying glucose control: 1.  Diabetic diet assessment: Usually 3 meals a day and sometimes related to bedtime snack.   Snack.  2.  Staying active or exercising: Walking.  3.  Medication compliance: compliant all of the time.  Interval history  Insulin  pump and CGM data as reviewed above.  Hemoglobin A1c improved to 7.6%.  No new complaints.  Patient is accompanied by daughter-in-law in the clinic today.  REVIEW  OF SYSTEMS As per history of present illness.   PAST MEDICAL HISTORY: Past Medical History:  Diagnosis Date   Acute respiratory failure with hypoxia (HCC) 12/21/2023   AKI (acute kidney injury) 04/16/2018   Arthritis    Carotid artery occlusion    Cataract    Had cataract surgery sometime before 2017   Coronary artery disease    CABG in 1996. Most recent cardiac catheterization in 2013 showed patent grafts including LIMA to LAD, SVG to OM, SVG to diagonal and SVG to PDA.   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   CARDIAC CATHETERIZATION     CORONARY ARTERY BYPASS GRAFT  1996   EYE SURGERY     FRACTURE SURGERY  03/2016   Broke wrist    ALLERGIES: No Known Allergies  FAMILY HISTORY:  Family History  Problem Relation Age of Onset   Breast cancer Mother 93   Stroke Mother    Emphysema Father 82   Heart attack Father    Diabetes Paternal Grandfather     SOCIAL HISTORY: Social History   Socioeconomic History   Marital status: Widowed    Spouse name: Not on file   Number of children: Not on file   Years of education: Not on file   Highest education level: Bachelor's degree (e.g., BA, AB, BS)  Occupational History   Occupation: Retired  Tobacco Use   Smoking status: Never   Smokeless tobacco: Never  Vaping Use   Vaping status: Never Used  Substance and Sexual Activity   Alcohol use: No   Drug use: Never   Sexual activity: Not on file  Other Topics Concern   Not on file  Social History Narrative   Pt lives w/ son, dtr-in-law and grandchildren.    Caffiene 2 cups daily      Social Drivers of Health   Financial Resource Strain: Low Risk  (12/23/2023)   Overall Financial Resource Strain (CARDIA)    Difficulty of Paying Living Expenses: Not hard at all  Food Insecurity: No Food Insecurity (12/23/2023)   Hunger Vital Sign    Worried About Running Out of Food in the Last Year: Never true    Ran Out of Food in the Last Year: Never true  Transportation Needs: No Transportation Needs (12/23/2023)   PRAPARE - Administrator, Civil Service (Medical): No    Lack of Transportation (Non-Medical): No  Physical Activity: Insufficiently Active (12/23/2023)   Exercise Vital Sign    Days of Exercise per Week: 7 days    Minutes of Exercise per Session: 10 min  Stress: No Stress Concern Present  (12/23/2023)   [employer] of Occupational Health - Occupational Stress Questionnaire    Feeling of Stress: Not at all  Social Connections: Moderately Isolated (12/23/2023)   Social Connection and Isolation Panel    Frequency of Communication with Friends and Family: Twice a week    Frequency of Social Gatherings with Friends and Family: Twice a week    Attends Religious Services: More than 4 times per year  Active Member of Clubs or Organizations: No    Attends Banker Meetings: Never    Marital Status: Widowed    MEDICATIONS:  Current Outpatient Medications  Medication Sig Dispense Refill   ADMELOG  100 UNIT/ML injection INJECT 40 UNITS INTO THE SKIN DAILY. VIA PUMP 40 mL 1   aspirin  EC 81 MG tablet Take 1 tablet (81 mg total) by mouth daily. 90 tablet 3   Calcium -Magnesium-Vitamin D (CALCIUM  MAGNESIUM PO) Take 1 tablet by mouth daily.     carvedilol  (COREG ) 6.25 MG tablet Take 1 tablet by mouth twice daily with meals. 60 tablet 0   Coenzyme Q10 (CO Q 10 PO) Take 1 capsule by mouth daily.     Continuous Blood Gluc Sensor (DEXCOM G6 SENSOR) MISC 1 each by Does not apply route See admin instructions. Use one sensor once every 10 days to monitor blood sugars. 3 each 0   Continuous Blood Gluc Transmit (DEXCOM G6 TRANSMITTER) MISC 1 each by Does not apply route See admin instructions. Use one transmitter once every 90 days to transmit blood sugars.     ezetimibe  (ZETIA ) 10 MG tablet Take 1 tablet by mouth daily. 90 tablet 3   Glucagon  (GVOKE HYPOPEN  1-PACK) 1 MG/0.2ML SOAJ Inject 1 mg into the skin as needed (low blood sugar with impaired consciousness). 0.4 mL 2   Insulin  Disposable Pump (OMNIPOD 5 DEXG7G6 PODS GEN 5) MISC APPLY EVERY 3 DAYS 30 each 3   Insulin  Disposable Pump (OMNIPOD 5 G6 INTRO, GEN 5,) KIT See admin instructions.     isosorbide  mononitrate (IMDUR ) 30 MG 24 hr tablet Take 1 tablet by mouth twice daily. 60 tablet 10   lisinopril  (ZESTRIL ) 10 MG tablet Take  1 tablet by mouth daily. 90 tablet 0   Multiple Vitamin (MULTIVITAMIN) tablet Take 1 tablet by mouth daily.     rosuvastatin  (CRESTOR ) 40 MG tablet Take 1 tablet by mouth daily. 90 tablet 3   glucose blood test strip Use Contour Next test strips as instructed to check blood sugar 4 times daily. DX:E10.65 150 each 1   No current facility-administered medications for this visit.    PHYSICAL EXAM: Vitals:   02/22/24 1615  BP: 120/60  Pulse: 93  Resp: 20  SpO2: 93%  Weight: 165 lb 6.4 oz (75 kg)  Height: 5' 4 (1.626 m)     Body mass index is 28.39 kg/m.  Wt Readings from Last 3 Encounters:  02/22/24 165 lb 6.4 oz (75 kg)  12/30/23 163 lb 3.2 oz (74 kg)  12/27/23 164 lb (74.4 kg)    General: Well developed, well nourished female in no apparent distress.  HEENT: AT/[place], no external lesions.  Eyes: Conjunctiva clear and no icterus. Neck: Neck supple  Lungs: Respirations not labored Neurologic: Alert, oriented, normal speech Extremities / Skin: Dry.  Psychiatric: Does not appear depressed or anxious  Diabetic Foot Exam - Simple   No data filed     LABS Reviewed Lab Results  Component Value Date   HGBA1C 7.6 (A) 02/22/2024   HGBA1C 8.0 (A) 10/13/2023   HGBA1C 8.5 (A) 07/18/2023   No results found for: FRUCTOSAMINE Lab Results  Component Value Date   CHOL 136 10/31/2023   HDL 71.00 10/31/2023   LDLCALC 52 10/31/2023   TRIG 66.0 10/31/2023   CHOLHDL 2 10/31/2023   Lab Results  Component Value Date   MICRALBCREAT 13 10/13/2023   Lab Results  Component Value Date   CREATININE 0.93 01/20/2024  Lab Results  Component Value Date   GFR 57.51 (L) 01/20/2024    ASSESSMENT / PLAN  1. Uncontrolled type 1 diabetes mellitus with hyperglycemia (HCC)    Diabetes Mellitus type 1, complicated by diabetic retinopathy/neuropathy/CKD. - Diabetic status / severity: Fair control.  Lab Results  Component Value Date   HGBA1C 7.6 (A) 02/22/2024    - Hemoglobin A1c  goal <7.5%   Diabetes control Improving.    Patient has uncontrolled type 1 diabetes mellitus.  She has mostly hyperglycemia related with meals postprandially with inadequate mealtime bolus insulin  via pump.  Patient is advised to use automatic mode as much as possible.  Advised to meal bolus 10 to 15 minutes before meals and for all the meals.  - Medications:   OmniPod 5 insulin  pump with Dexcom G6 CGM, using Admelog  U100  Insulin  Pump setting: No change of the pump setting today.  - Home glucose testing: continue CGM and check blood glucose as needed.  - Discussed/ Gave Hypoglycemia treatment plan.  Has Glucagon  Emergency Kit at home.  # Consult : not required at this time.   # Annual urine for microalbuminuria/ creatinine ratio, no microalbuminuria currently, continue ACE/ARB /lisinopril .  Will check today. Last  Lab Results  Component Value Date   MICRALBCREAT 13 10/13/2023    # Foot check nightly / neuropathy.  # Has diabetic retinopathy, regular follow-up with ophthalmology.  - Diet: Make healthy diabetic food choices - Life style / activity / exercise: Discussed.  2. Blood pressure  -  BP Readings from Last 1 Encounters:  02/22/24 120/60    - Control is in target.  - No change in current plans.  3. Lipid status / Hyperlipidemia - Last  Lab Results  Component Value Date   LDLCALC 52 10/31/2023   - Continue rosuvastatin  40 mg daily.  Zetia  10 mg daily.  Diagnoses and all orders for this visit:  Uncontrolled type 1 diabetes mellitus with hyperglycemia (HCC) -     POCT glycosylated hemoglobin (Hb A1C)    DISPOSITION Follow up in clinic in 4 months suggested.   All questions answered and patient verbalized understanding of the plan.  Synai Prettyman, MD [facility] [address] [place], [address] [place], [place] [address] Phone # [phone]  At least part of this note was generated using voice recognition software.  Inadvertent word errors may have occurred, which were not recognized during the proofreading process.

## 2024-03-05 ENCOUNTER — Ambulatory Visit: Admitting: Physician Assistant

## 2024-03-05 NOTE — Progress Notes (Deleted)
 Cardiology Office Note    Date:  03/05/2024   ID:  Natasha Hernandez, DOB [birth year], MRN [id number]  PCP:  Katheen Roselie Rockford, NP  Cardiologist:  Deatrice Cage, MD  Electrophysiologist:  None   Chief Complaint: ***  History of Present Illness:   Natasha Hernandez is a 83 y.o. female with history of coronary artery disease s/p CABG 1996, type 1 diabetes, hypertension, hyperlipidemia, PAD, and carotid artery disease who presents for follow-up on CAD and PAD.    Most recent cardiac cath in 2013 showed patent grafts including SVG to OM, SVG to diagonal, SVG to PDA and LIMA to LAD.  She has known occluded right carotid artery.  She is known to have peripheral artery disease without symptoms of claudication.  Noninvasive vascular evaluation in 11/2015 showed ABI of 0.5 on the right and 1.1 on the left.  Duplex showed no obstructive aortic iliac or SFA disease.  There is one-vessel runoff below the knee bilaterally.  Most recent Lexiscan  Myoview  04/2018 showed no evidence of ischemia with normal EF.   Patient was most recently seen in our office 12/17/2022 overall doing reasonably well without chest pain or shortness of breath.  She did complain of increased dizziness without syncope.  Blood pressure was noted to be borderline hypotensive and Imdur  was reduced to 30 mg twice daily.  ***  Labs independently reviewed: 01/2024-sodium 140, potassium 5.0, BUN 27, creatinine 0.93, Hgb 12.7, HCT 38.8, platelets 163 10/2023-TC 136, TG 66, HDL 71, LDL 52  Objective   Past Medical History:  Diagnosis Date   Acute respiratory failure with hypoxia (HCC) 12/21/2023   AKI (acute kidney injury) 04/16/2018   Arthritis    Carotid artery occlusion    Cataract    Had cataract surgery sometime before 2017   Coronary artery disease    CABG in 1996. Most recent cardiac catheterization in 2013 showed patent grafts including LIMA to LAD, SVG to OM, SVG to diagonal and SVG to PDA.   Diabetes mellitus  without complication (HCC)    GERD (gastroesophageal reflux disease)    Hyperlipidemia    Hypertension    Neuromuscular disorder (HCC)    Osteoporosis    PAD (peripheral artery disease)    Pain in finger of right hand 04/13/2016   Pneumonia due to COVID-19 virus 03/27/2019    Current Medications: No outpatient medications have been marked as taking for the 03/05/24 encounter (Appointment) with Lorene Lesley CROME, PA-C.    Allergies:   Patient has no known allergies.   Social History   Socioeconomic History   Marital status: Widowed    Spouse name: Not on file   Number of children: Not on file   Years of education: Not on file   Highest education level: Bachelor's degree (e.g., BA, AB, BS)  Occupational History   Occupation: Retired  Tobacco Use   Smoking status: Never   Smokeless tobacco: Never  Vaping Use   Vaping status: Never Used  Substance and Sexual Activity   Alcohol use: No   Drug use: Never   Sexual activity: Not on file  Other Topics Concern   Not on file  Social History Narrative   Pt lives w/ son, dtr-in-law and grandchildren.    Caffiene 2 cups daily      Social Drivers of Health   Financial Resource Strain: Low Risk  (12/23/2023)   Overall Financial Resource Strain (CARDIA)    Difficulty of Paying Living Expenses: Not hard at all  Food Insecurity: No Food Insecurity (12/23/2023)   Hunger Vital Sign    Worried About Running Out of Food in the Last Year: Never true    Ran Out of Food in the Last Year: Never true  Transportation Needs: No Transportation Needs (12/23/2023)   PRAPARE - Administrator, Civil Service (Medical): No    Lack of Transportation (Non-Medical): No  Physical Activity: Insufficiently Active (12/23/2023)   Exercise Vital Sign    Days of Exercise per Week: 7 days    Minutes of Exercise per Session: 10 min  Stress: No Stress Concern Present (12/23/2023)   [employer] of Occupational Health - Occupational Stress  Questionnaire    Feeling of Stress: Not at all  Social Connections: Moderately Isolated (12/23/2023)   Social Connection and Isolation Panel    Frequency of Communication with Friends and Family: Twice a week    Frequency of Social Gatherings with Friends and Family: Twice a week    Attends Religious Services: More than 4 times per year    Active Member of [employer] or Organizations: No    Attends Banker Meetings: Never    Marital Status: Widowed     Family History:  The patient's family history includes Breast cancer (age of onset: 69) in her mother; Diabetes in her paternal grandfather; Emphysema (age of onset: 31) in her father; Heart attack in her father; Stroke in her mother.  ROS:   12-point review of systems is negative unless otherwise noted in the HPI.  EKGs/Other Studies Reviewed:    Studies reviewed were summarized above. The additional studies were reviewed today:  07/2023 Carotid duplex Right Carotid: Evidence consistent with a total occlusion of the right ICA.  Left Carotid: Velocities in the left ICA are consistent with a 1-39% stenosis.  Vertebrals: Bilateral vertebral arteries demonstrate antegrade flow.  Subclavians: Normal flow hemodynamics were seen in bilateral subclavian arteries.   11/2021 ABIs Right: Resting right ankle-brachial index indicates noncompressible right lower extremity arteries. The right toe-brachial index is abnormal.  Left: Resting left ankle-brachial index indicates noncompressible left lower extremity arteries. The left toe-brachial index is normal.   EKG:  EKG personally reviewed by me today    PHYSICAL EXAM:    VS:  There were no vitals taken for this visit.  BMI: There is no height or weight on file to calculate BMI.  GEN: Well nourished, well developed in no acute distress NECK: No JVD; No carotid bruits CARDIAC: ***RRR, no murmurs, rubs, gallops RESPIRATORY:  Clear to auscultation without rales, wheezing or rhonchi   ABDOMEN: Soft, non-tender, non-distended EXTREMITIES:  *** No edema; No deformity  Wt Readings from Last 3 Encounters:  02/22/24 165 lb 6.4 oz (75 kg)  12/30/23 163 lb 3.2 oz (74 kg)  12/27/23 164 lb (74.4 kg)                  ASSESSMENT & PLAN:   Coronary artery disease   Peripheral artery disease   Bilateral carotid artery disease   Hyperlipidemia   Hypertension    {Are you ordering a CV Procedure (e.g. stress test, cath, DCCV, TEE, etc)?   Press F2        :[id number]}   Disposition: F/u with Dr. Darron or an APP in ***.   Medication Adjustments/Labs and Tests Ordered: Current medicines are reviewed at length with the patient today.  Concerns regarding medicines are outlined above. Medication changes, Labs and Tests ordered today are summarized above and  listed in the Patient Instructions accessible in Encounters.   Bonney Lesley Maffucci, PA-C 03/05/2024 8:03 AM     [facility] [place] [address] [place], [place] [address] [phone]

## 2024-03-19 ENCOUNTER — Other Ambulatory Visit: Payer: Self-pay | Admitting: Family Medicine

## 2024-03-26 ENCOUNTER — Encounter: Payer: Self-pay | Admitting: Podiatry

## 2024-03-26 ENCOUNTER — Ambulatory Visit: Admitting: Podiatry

## 2024-03-26 ENCOUNTER — Ambulatory Visit (INDEPENDENT_AMBULATORY_CARE_PROVIDER_SITE_OTHER): Admitting: Podiatry

## 2024-03-26 DIAGNOSIS — M79675 Pain in left toe(s): Secondary | ICD-10-CM | POA: Diagnosis not present

## 2024-03-26 DIAGNOSIS — Z91198 Patient's noncompliance with other medical treatment and regimen for other reason: Secondary | ICD-10-CM

## 2024-03-26 DIAGNOSIS — M79674 Pain in right toe(s): Secondary | ICD-10-CM | POA: Diagnosis not present

## 2024-03-26 DIAGNOSIS — L84 Corns and callosities: Secondary | ICD-10-CM | POA: Diagnosis not present

## 2024-03-26 DIAGNOSIS — B351 Tinea unguium: Secondary | ICD-10-CM | POA: Diagnosis not present

## 2024-03-26 DIAGNOSIS — E1042 Type 1 diabetes mellitus with diabetic polyneuropathy: Secondary | ICD-10-CM | POA: Diagnosis not present

## 2024-03-26 NOTE — Progress Notes (Signed)
 1. Failure to attend appointment with reason given    Patient moved to earlier time slot due to weather.

## 2024-03-30 ENCOUNTER — Encounter: Payer: Self-pay | Admitting: Podiatry

## 2024-03-30 NOTE — Progress Notes (Unsigned)
°  Subjective:  Patient ID: Natasha Hernandez, female    DOB: 1941/03/16,  MRN: [id number]  Natasha Hernandez presents to clinic today for {jgcomplaint:23593}  Chief Complaint  Patient presents with   [facility]    Rm3 Diabetic foot care/ Dr. Roselie Rockford Last visit September 2025/ A1c 7.6   New problem(s): None. {jgcomplaint:23593}  PCP is Nche, Roselie Rockford, NP.  Allergies[1]  Review of Systems: Negative except as noted in the HPI.  Objective: No changes noted in today's physical examination. There were no vitals filed for this visit. Natasha Hernandez is a pleasant 83 y.o. female WD, WN in NAD. AAO x 3.  Vascular Examination: CFT <3 seconds b/l. DP/PT pulses faintly palpable b/l. Pedal edema absent. Skin temperature gradient warm to warm b/l. Digital hair absent. No pain with calf compression. No ischemia or gangrene. No cyanosis or clubbing noted b/l. No edema noted b/l LE.   Neurological Examination: Pt has subjective symptoms of neuropathy. Protective sensation diminished with 10g monofilament b/l. Vibratory sensation intact b/l.  Dermatological Examination: Pedal skin thin, shiny and atrophic b/l.  No open wounds. No interdigital macerations.  Toenails 1-5 b/l thick, discolored, elongated with subungual debris and pain on dorsal palpation.    Hyperkeratotic lesion(s) submet head 5 right foot.  No erythema, no edema, no drainage, no fluctuance.  Musculoskeletal Examination: Muscle strength 5/5 to all lower extremity muscle groups bilaterally. Severe hammertoe deformity noted 1-5 left foot and 1-5 right foot. Pes cavus deformity noted b/l lower extremities.  Radiographs: None  Assessment/Plan: No diagnosis found.  No orders of the defined types were placed in this encounter.   None {Jgplan:23602::-Patient/POA to call should there be question/concern in the interim.}   Return in about 9 weeks (around 05/28/2024).  Delon LITTIE Merlin, DPM      [place]  LOCATION: [address] [place] [address]                   Office [phone]   [facility] LOCATION: [address] [place], [place] [address] Office [phone]     [1] No Known Allergies

## 2024-04-05 ENCOUNTER — Other Ambulatory Visit: Payer: Self-pay | Admitting: Cardiovascular Disease

## 2024-05-02 ENCOUNTER — Encounter: Payer: Self-pay | Admitting: Nurse Practitioner

## 2024-05-02 ENCOUNTER — Ambulatory Visit (INDEPENDENT_AMBULATORY_CARE_PROVIDER_SITE_OTHER): Admitting: Nurse Practitioner

## 2024-05-02 VITALS — BP 120/72 | HR 82 | Temp 98.0°F | Ht 65.0 in | Wt 162.2 lb

## 2024-05-02 DIAGNOSIS — F0150 Vascular dementia without behavioral disturbance: Secondary | ICD-10-CM

## 2024-05-02 DIAGNOSIS — E103553 Type 1 diabetes mellitus with stable proliferative diabetic retinopathy, bilateral: Secondary | ICD-10-CM | POA: Insufficient documentation

## 2024-05-02 DIAGNOSIS — E785 Hyperlipidemia, unspecified: Secondary | ICD-10-CM

## 2024-05-02 DIAGNOSIS — E1069 Type 1 diabetes mellitus with other specified complication: Secondary | ICD-10-CM | POA: Diagnosis not present

## 2024-05-02 NOTE — Progress Notes (Signed)
 "               Established Patient Visit  Patient: Natasha Hernandez   DOB: [birth year]   84 y.o. Female  MRN: [id number] Visit Date: 05/02/2024  Subjective:    Chief Complaint  Patient presents with   Follow-up    NOT FASTING 6 month follow up for HTN, Hyperlipidemia    HPI Accompanied by daughter in law.  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 Denies any change in vision  Hyperlipidemia due to type 1 diabetes mellitus (HCC) LDL at goal with zetia  and crestor   Vascular dementia without behavioral disturbance, psychotic disturbance, mood disturbance, or anxiety, unspecified dementia severity (HCC) Stable mood per daughter in law. Normal sleep pattern Normal appetite. No change in GI/GU function  Wt Readings from Last 3 Encounters:  05/02/24 162 lb 3.2 oz (73.6 kg)  02/22/24 165 lb 6.4 oz (75 kg)  12/30/23 163 lb 3.2 oz (74 kg)    Reviewed medical, surgical, and social history today  Medications: Show/hide medication list[1] Reviewed past medical and social history.   ROS per HPI above      Objective:  BP 120/72 (BP Location: Right Arm, Patient Position: Sitting, Cuff Size: Large)   Pulse 82   Temp 98 F (36.7 C) (Oral)   Ht 5' 5 (1.651 m)   Wt 162 lb 3.2 oz (73.6 kg)   SpO2 96%   BMI 26.99 kg/m      Physical Exam Vitals and nursing note reviewed.  Cardiovascular:     Rate and Rhythm: Normal rate and regular rhythm.     Pulses: Normal pulses.     Heart sounds: Normal heart sounds.  Pulmonary:     Effort: Pulmonary effort is normal.     Breath sounds: Normal breath sounds.  Musculoskeletal:     Right lower leg: No edema.     Left lower leg: No edema.  Neurological:     Mental Status: She is alert.     Comments: Person and family  Psychiatric:        Mood and Affect: Mood normal.        Behavior: Behavior normal.         Thought Content: Thought content normal.     No results found for any visits on 05/02/24.    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 Denies any change in vision      Vascular dementia without behavioral disturbance, psychotic disturbance, mood disturbance, or anxiety, unspecified dementia severity (HCC) - Primary   Stable mood per daughter in law. Normal sleep pattern Normal appetite. No change in GI/GU function      Return in about 3 months (around 07/31/2024) for HTN, DM, hyperlipidemia (fasting).     Roselie Mood, NP      [1]  Outpatient Medications Prior to Visit  Medication Sig   ADMELOG  100 UNIT/ML injection INJECT 40 UNITS INTO THE SKIN DAILY. VIA PUMP   aspirin  EC 81 MG tablet Take 1 tablet (81 mg total) by mouth daily.   Calcium -Magnesium-Vitamin D (CALCIUM   MAGNESIUM PO) Take 1 tablet by mouth daily.   carvedilol  (COREG ) 6.25 MG tablet Take 1 tablet (6.25 mg total) by mouth 2 (two) times daily with a meal.   Coenzyme Q10 (CO Q 10 PO) Take 1 capsule by mouth daily.   Continuous Blood Gluc Sensor (DEXCOM G6 SENSOR) MISC 1 each by Does not apply route See admin instructions. Use one sensor once every 10 days to monitor blood sugars.   Continuous Blood Gluc Transmit (DEXCOM G6 TRANSMITTER) MISC 1 each by Does not apply route See admin instructions. Use one transmitter once every 90 days to transmit blood sugars.   ezetimibe  (ZETIA ) 10 MG tablet Take 1 tablet by mouth daily.   Glucagon  (GVOKE HYPOPEN  1-PACK) 1 MG/0.2ML SOAJ Inject 1 mg into the skin as needed (low blood sugar with impaired consciousness).   Insulin  Disposable Pump (OMNIPOD 5 DEXG7G6 PODS GEN 5) MISC  APPLY EVERY 3 DAYS   Insulin  Disposable Pump (OMNIPOD 5 G6 INTRO, GEN 5,) KIT See admin instructions.   isosorbide  mononitrate (IMDUR ) 30 MG 24 hr tablet Take 1 tablet by mouth twice daily.   lisinopril  (ZESTRIL ) 10 MG tablet Take 1 tablet by mouth daily.   Multiple Vitamin (MULTIVITAMIN) tablet Take 1 tablet by mouth daily.   rosuvastatin  (CRESTOR ) 40 MG tablet Take 1 tablet by mouth daily.   [DISCONTINUED] glucose blood test strip Use Contour Next test strips as instructed to check blood sugar 4 times daily. DX:E10.65   No facility-administered medications prior to visit.   "

## 2024-05-02 NOTE — Patient Instructions (Signed)
 Maintain Heart healthy diet and daily exercise. Maintain current medications.

## 2024-05-02 NOTE — Assessment & Plan Note (Signed)
 Stable mood per daughter in law. Normal sleep pattern Normal appetite. No change in GI/GU function

## 2024-05-02 NOTE — Assessment & Plan Note (Signed)
 Utd with annual appointment with retina specialist and ophthalmology. Denies any change in vision

## 2024-05-02 NOTE — Assessment & Plan Note (Signed)
 LDL at goal with zetia  and crestor 

## 2024-05-06 ENCOUNTER — Other Ambulatory Visit: Payer: Self-pay | Admitting: Endocrinology

## 2024-05-06 DIAGNOSIS — E1065 Type 1 diabetes mellitus with hyperglycemia: Secondary | ICD-10-CM

## 2024-05-07 ENCOUNTER — Telehealth: Payer: Self-pay | Admitting: Cardiovascular Disease

## 2024-05-07 NOTE — Telephone Encounter (Signed)
" °*  STAT* If patient is at the pharmacy, call can be transferred to refill team.   1. Which medications need to be refilled? (please list name of each medication and dose if known)   carvedilol  (COREG ) 6.25 MG tablet     2. Would you like to learn more about the convenience, safety, & potential cost savings by using the [facility]?    3. Are you open to using the [facility] (Type [facility].    4. Which pharmacy/location (including street and city if local pharmacy) is medication to be sent to? [id number] - [place], [place] - [address]    5. Do they need a 30 day or 90 day supply? Refill until patient appointment 05/09/24  "

## 2024-05-08 ENCOUNTER — Other Ambulatory Visit: Payer: Self-pay | Admitting: Cardiovascular Disease

## 2024-05-08 NOTE — Telephone Encounter (Signed)
" °*  STAT* If patient is at the pharmacy, call can be transferred to refill team.   1. Which medications need to be refilled? (please list name of each medication and dose if known) Carvedilol    2. Would you like to learn more about the convenience, safety, & potential cost savings by using the [facility]? no   3. Are you open to using the [facility] ([facility]) NO.   4. Which pharmacy/location (including street and city if local pharmacy) is medication to be sent to?[facility] [address]   5. Do they need a 30 day or 90 day supply? Patient is  out of medication. 30 day (patient has follow up 1/28)  "

## 2024-05-09 ENCOUNTER — Other Ambulatory Visit: Payer: Self-pay | Admitting: Endocrinology

## 2024-05-09 ENCOUNTER — Ambulatory Visit: Admitting: Cardiovascular Disease

## 2024-05-09 DIAGNOSIS — E7800 Pure hypercholesterolemia, unspecified: Secondary | ICD-10-CM

## 2024-05-09 MED ORDER — CARVEDILOL 6.25 MG PO TABS: 6.2500 mg | ORAL_TABLET | Freq: Two times a day (BID) | ORAL | 0 refills | Status: DC

## 2024-05-10 MED ORDER — CARVEDILOL 6.25 MG PO TABS: 6.2500 mg | ORAL_TABLET | Freq: Two times a day (BID) | ORAL | 0 refills | Status: DC

## 2024-05-10 NOTE — Telephone Encounter (Signed)
 Refill was sent 05/09/24.

## 2024-05-16 ENCOUNTER — Other Ambulatory Visit: Payer: Self-pay | Admitting: Cardiovascular Disease

## 2024-05-16 ENCOUNTER — Encounter: Payer: Self-pay | Admitting: Cardiovascular Disease

## 2024-05-16 ENCOUNTER — Ambulatory Visit: Attending: Cardiovascular Disease | Admitting: Cardiovascular Disease

## 2024-05-16 VITALS — BP 124/60 | HR 76 | Ht 65.0 in | Wt 162.2 lb

## 2024-05-16 DIAGNOSIS — I2510 Atherosclerotic heart disease of native coronary artery without angina pectoris: Secondary | ICD-10-CM | POA: Insufficient documentation

## 2024-05-16 DIAGNOSIS — I739 Peripheral vascular disease, unspecified: Secondary | ICD-10-CM | POA: Diagnosis present

## 2024-05-16 DIAGNOSIS — E785 Hyperlipidemia, unspecified: Secondary | ICD-10-CM | POA: Diagnosis present

## 2024-05-16 DIAGNOSIS — I6523 Occlusion and stenosis of bilateral carotid arteries: Secondary | ICD-10-CM | POA: Diagnosis present

## 2024-05-16 DIAGNOSIS — I10 Essential (primary) hypertension: Secondary | ICD-10-CM | POA: Insufficient documentation

## 2024-05-16 MED ORDER — CARVEDILOL 6.25 MG PO TABS: 6.2500 mg | ORAL_TABLET | Freq: Two times a day (BID) | ORAL | 3 refills | Status: AC

## 2024-05-16 NOTE — Progress Notes (Unsigned)
 "    Cardiology Office Note   Date:  05/17/2024   ID:  Natasha Hernandez, DOB Jul 23, 1940, MRN [id number]  PCP:  Katheen Roselie Rockford, NP  Cardiologist:  Darron  Chief Complaint  Patient presents with   Follow-up    OD 12 month f/u no complaints today. Meds reviewed verbally with pt.      History of Present Illness: Natasha Hernandez is a 84 y.o. female who is here today for a follow-up visit regarding coronary artery disease and peripheral arterial disease. She has extensive medical problems that include coronary artery disease status post CABG in 1996 at [facility], type 1 diabetes since she was 84 years old, hypertension, hyperlipidemia, peripheral arterial disease and carotid artery disease. Most recent cardiac catheterization in 2013 showed patent grafts including SVG to OM, SVG to diagonal, SVG to PDA and LIMA to LAD. She is known to have occluded right carotid artery . She is a lifelong nonsmoker. She is known to have peripheral arterial disease with no significant claudication. Noninvasive vascular evaluation in August 2017 showed an ABI of 0.85 on the right and 1.1 on the left. Duplex showed no obstructive aortoiliac or SFA disease. There was one-vessel runoff below the knee bilaterally.  Most recent Lexiscan  Myoview  in January 2020 showed no evidence of ischemia with normal ejection fraction.  She has not been seen in our office in more than 1 year.  She has been doing well with no chest pain, shortness of breath or palpitations.  She is accompanied by her daughter who reports progressive vascular dementia.  Past Medical History:  Diagnosis Date   Acute respiratory failure with hypoxia (HCC) 12/21/2023   AKI (acute kidney injury) 04/16/2018   Arthritis    Carotid artery occlusion    Cataract    Had cataract surgery sometime before 2017   Coronary artery disease    CABG in 1996. Most recent cardiac catheterization in 2013 showed patent grafts including LIMA to LAD,  SVG to OM, SVG to diagonal and SVG to PDA.   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 VIA PUMP 40 mL 1   aspirin  EC 81 MG tablet Take 1 tablet (81 mg total) by mouth daily. 90 tablet 3   Calcium -Magnesium-Vitamin D (CALCIUM  MAGNESIUM PO) Take 1 tablet by mouth daily.     Coenzyme Q10 (CO Q 10 PO) Take 1 capsule by mouth daily.     Continuous Blood Gluc Sensor (DEXCOM G6 SENSOR) MISC 1 each by Does not apply route See admin instructions. Use one sensor once every 10 days to monitor blood sugars. 3 each 0   Continuous Blood Gluc Transmit (DEXCOM G6 TRANSMITTER) MISC 1 each by Does not apply route See admin instructions. Use one transmitter once every 90  days to transmit blood sugars.     ezetimibe  (ZETIA ) 10 MG tablet Take 1 tablet by mouth daily. 90 tablet 2   Insulin  Disposable Pump (OMNIPOD 5 DEXG7G6 PODS GEN 5) MISC APPLY EVERY 3 DAYS 30 each 3   Insulin  Disposable Pump (OMNIPOD 5 G6 INTRO, GEN 5,) KIT See admin instructions.     isosorbide  mononitrate (IMDUR ) 30 MG 24 hr tablet Take 1 tablet by mouth twice daily. 60 tablet 10   lisinopril  (ZESTRIL ) 10 MG tablet Take 1 tablet by mouth daily. 90 tablet 3   Multiple Vitamin (MULTIVITAMIN) tablet Take 1 tablet by mouth daily.     rosuvastatin  (CRESTOR ) 40 MG tablet Take 1 tablet by mouth daily. 90 tablet 2   carvedilol  (COREG ) 6.25  MG tablet Take 1 tablet (6.25 mg total) by mouth 2 (two) times daily with a meal. 180 tablet 3   Glucagon  (GVOKE HYPOPEN  1-PACK) 1 MG/0.2ML SOAJ Inject 1 mg into the skin as needed (low blood sugar with impaired consciousness). (Patient not taking: Reported on 05/16/2024) 0.4 mL 2   No current facility-administered medications for this visit.    Allergies:   Patient has no known allergies.    Social History:  The patient  reports that she has never smoked. She has never used smokeless tobacco. She reports that she does not drink alcohol and does not use drugs.   Family History:  The patient's Family history is negative for coronary artery disease.   ROS:  Please see the history of present illness.   Otherwise, review of systems are positive for none.   All other systems are reviewed and negative.    PHYSICAL EXAM: VS:  BP 124/60 (BP Location: Left Arm, Patient Position: Sitting, Cuff Size: Normal)   Pulse 76   Ht 5' 5 (1.651 m)   Wt 162 lb 4 oz (73.6 kg)   SpO2 98%   BMI 27.00 kg/m  , BMI Body mass index is 27 kg/m. GEN: Well nourished, well developed, in no acute distress  HEENT: normal  Neck: no JVD, carotid bruits, or masses Cardiac: RRR; no murmurs, rubs, or gallops,no edema  Respiratory:  clear to auscultation bilaterally, normal work of breathing GI: soft, nontender, nondistended, + BS MS: no deformity or atrophy  Skin: warm and dry, no rash Neuro:  Strength and sensation are intact Psych: euthymic mood, full affect She does have palpable distal pulses.  EKG:  EKG is ordered today. EKG showed : Normal sinus rhythm with sinus arrhythmia Normal ECG       Recent Labs: 10/31/2023: TSH 2.70 11/24/2023: ALT 18 12/21/2023: Magnesium 2.3 01/20/2024: BUN 27; Creatinine, Ser 0.93; Hemoglobin 12.7; Platelets 163.0; Potassium 5.0; Sodium 140    Lipid Panel    Component Value Date/Time   CHOL 136 10/31/2023 1140   TRIG 66.0 10/31/2023 1140   HDL 71.00 10/31/2023 1140    CHOLHDL 2 10/31/2023 1140   VLDL 13.2 10/31/2023 1140   LDLCALC 52 10/31/2023 1140      Wt Readings from Last 3 Encounters:  05/16/24 162 lb 4 oz (73.6 kg)  05/02/24 162 lb 3.2 oz (73.6 kg)  02/22/24 165 lb 6.4 oz (75 kg)           No data to display            ASSESSMENT AND PLAN:  1.  Coronary artery disease involving native coronary arteries without angina: She is doing well overall with no anginal symptoms.  Continue medical therapy  2. Peripheral arterial disease: Currently with no significant claudication and no evidence of critical limb ischemia. Continue medical therapy.    3. 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  Most recent carotid Doppler in April of last year showed occluded right ICA with no significant disease on the left.  No plans for repeat testing unless she becomes symptomatic.  She is unlikely to be a candidate for any intervention given her progressive dementia.  4. Hyperlipidemia: Continue treatment with rosuvastatin  and Zetia .  Most recent lipid profile showed an LDL of 52.  Her renal function is stable.  5. Essential hypertension: Blood pressure is controlled on current medications.    Disposition:   FU with me in 12 months  Signed,  Deatrice Cage, MD  05/17/2024 8:56 AM    [facility] "

## 2024-05-16 NOTE — Patient Instructions (Signed)

## 2024-06-19 ENCOUNTER — Ambulatory Visit: Admitting: Endocrinology

## 2024-06-25 ENCOUNTER — Ambulatory Visit: Admitting: Podiatry

## 2024-08-02 ENCOUNTER — Ambulatory Visit: Admitting: Nurse Practitioner

## 2024-08-27 ENCOUNTER — Ambulatory Visit: Admitting: Podiatry

## 2024-12-24 ENCOUNTER — Ambulatory Visit
